# Patient Record
Sex: Male | Born: 1935 | Race: White | Hispanic: No | Marital: Married | Smoking: Former smoker
Health system: Southern US, Community
[De-identification: ages and names within clinical notes are randomized; demographics above are authoritative.]

## PROBLEM LIST (undated history)

## (undated) DIAGNOSIS — C61 Malignant neoplasm of prostate: Secondary | ICD-10-CM

## (undated) DIAGNOSIS — I480 Paroxysmal atrial fibrillation: Secondary | ICD-10-CM

## (undated) DIAGNOSIS — N183 Chronic kidney disease, stage 3 unspecified: Secondary | ICD-10-CM

## (undated) DIAGNOSIS — I10 Essential (primary) hypertension: Secondary | ICD-10-CM

## (undated) DIAGNOSIS — H539 Unspecified visual disturbance: Secondary | ICD-10-CM

## (undated) DIAGNOSIS — R0600 Dyspnea, unspecified: Secondary | ICD-10-CM

## (undated) DIAGNOSIS — I2510 Atherosclerotic heart disease of native coronary artery without angina pectoris: Secondary | ICD-10-CM

## (undated) DIAGNOSIS — K5190 Ulcerative colitis, unspecified, without complications: Secondary | ICD-10-CM

## (undated) DIAGNOSIS — R0609 Other forms of dyspnea: Secondary | ICD-10-CM

## (undated) DIAGNOSIS — E785 Hyperlipidemia, unspecified: Secondary | ICD-10-CM

## (undated) DIAGNOSIS — H4089 Other specified glaucoma: Secondary | ICD-10-CM

## (undated) DIAGNOSIS — Z5189 Encounter for other specified aftercare: Secondary | ICD-10-CM

## (undated) DIAGNOSIS — D649 Anemia, unspecified: Secondary | ICD-10-CM

## (undated) DIAGNOSIS — D7589 Other specified diseases of blood and blood-forming organs: Secondary | ICD-10-CM

## (undated) DIAGNOSIS — C4490 Unspecified malignant neoplasm of skin, unspecified: Secondary | ICD-10-CM

## (undated) HISTORY — DX: Encounter for other specified aftercare: Z51.89

## (undated) HISTORY — PX: EYE SURGERY: SHX253

## (undated) HISTORY — DX: Atherosclerotic heart disease of native coronary artery without angina pectoris: I25.10

## (undated) HISTORY — DX: Paroxysmal atrial fibrillation: I48.0

## (undated) HISTORY — DX: Other specified diseases of blood and blood-forming organs: D75.89

## (undated) HISTORY — DX: Anemia, unspecified: D64.9

## (undated) HISTORY — DX: Malignant neoplasm of prostate: C61

## (undated) HISTORY — PX: CARDIAC SURGERY: SHX584

## (undated) HISTORY — DX: Unspecified malignant neoplasm of skin, unspecified: C44.90

## (undated) HISTORY — DX: Ulcerative colitis, unspecified, without complications: K51.90

## (undated) HISTORY — DX: Dyspnea, unspecified: R06.00

## (undated) HISTORY — DX: Chronic kidney disease, stage 3 unspecified: N18.30

## (undated) HISTORY — DX: Unspecified visual disturbance: H53.9

## (undated) HISTORY — DX: Essential (primary) hypertension: I10

## (undated) HISTORY — DX: Hyperlipidemia, unspecified: E78.5

## (undated) HISTORY — DX: Other specified glaucoma: H40.89

## (undated) HISTORY — DX: Other forms of dyspnea: R06.09

---

## 1988-03-22 DIAGNOSIS — M329 Systemic lupus erythematosus, unspecified: Secondary | ICD-10-CM

## 1988-03-22 HISTORY — DX: Systemic lupus erythematosus, unspecified: M32.9

## 1995-06-06 ENCOUNTER — Ambulatory Visit: Admission: RE | Admit: 1995-06-06 | Payer: Self-pay | Source: Ambulatory Visit | Admitting: Gastroenterology

## 1997-12-08 ENCOUNTER — Ambulatory Visit: Admit: 1997-12-08 | Disposition: A | Discharge status: Home / Self Care | Payer: Self-pay | Source: Ambulatory Visit

## 1997-12-12 ENCOUNTER — Ambulatory Visit: Admission: RE | Admit: 1997-12-12 | Payer: Self-pay | Source: Ambulatory Visit | Admitting: Gastroenterology

## 2000-12-01 ENCOUNTER — Ambulatory Visit: Admission: RE | Admit: 2000-12-01 | Payer: Self-pay | Source: Ambulatory Visit

## 2001-01-13 ENCOUNTER — Ambulatory Visit: Admission: EM | Admit: 2001-01-13 | Payer: Self-pay | Source: Ambulatory Visit

## 2002-01-22 ENCOUNTER — Ambulatory Visit: Admission: EM | Admit: 2002-01-22 | Payer: Self-pay | Source: Ambulatory Visit | Admitting: Gastroenterology

## 2006-09-13 ENCOUNTER — Ambulatory Visit: Admission: RE | Admit: 2006-09-13 | Payer: Self-pay | Source: Ambulatory Visit | Admitting: Gastroenterology

## 2008-02-21 ENCOUNTER — Ambulatory Visit
Admit: 2008-02-21 | Disposition: A | Discharge status: Home / Self Care | Payer: Self-pay | Source: Ambulatory Visit | Admitting: Gastroenterology

## 2008-02-21 LAB — CBC
Hematocrit: 35.8 % — ABNORMAL LOW (ref 42.0–52.0)
Hgb: 10.7 G/DL — ABNORMAL LOW (ref 13.0–17.0)
MCH: 24.2 PG — ABNORMAL LOW (ref 28.0–32.0)
MCHC: 29.9 G/DL — ABNORMAL LOW (ref 32.0–36.0)
MCV: 80.8 FL (ref 80.0–100.0)
MPV: 9.6 FL (ref 9.4–12.3)
Platelets: 336 /mm3 (ref 140–400)
RBC: 4.43 /mm3 — ABNORMAL LOW (ref 4.70–6.00)
RDW: 22.6 % — ABNORMAL HIGH (ref 11.5–15.0)
WBC: 6.98 /mm3 (ref 3.50–10.80)

## 2008-02-21 LAB — COMPREHENSIVE METABOLIC PANEL
ALT: 18 U/L (ref 3–36)
AST (SGOT): 22 U/L (ref 10–41)
Albumin/Globulin Ratio: 1.2 (ref 1.1–1.8)
Albumin: 3.8 g/dL (ref 3.4–4.9)
Alkaline Phosphatase: 73 U/L (ref 43–112)
BUN: 18 mg/dL (ref 8–20)
Bilirubin, Total: <0.1 mg/dL (ref 0.1–1.0)
CO2: 28 mEq/L (ref 21–30)
Calcium: 9.1 mg/dL (ref 8.6–10.2)
Chloride: 106 mEq/L (ref 98–107)
Creatinine: 1.3 mg/dL (ref 0.6–1.5)
Globulin: 3.3 g/dL (ref 2.0–3.7)
Glucose: 77 mg/dL (ref 70–100)
Potassium: 4.9 mEq/L (ref 3.6–5.0)
Protein, Total: 7.1 g/dL (ref 6.0–8.0)
Sodium: 138 mEq/L (ref 136–146)

## 2008-02-21 LAB — GFR

## 2008-03-06 ENCOUNTER — Ambulatory Visit: Admission: RE | Admit: 2008-03-06 | Payer: Self-pay | Source: Ambulatory Visit | Admitting: Gastroenterology

## 2010-12-24 LAB — ECG 12-LEAD
Atrial Rate: 51 {beats}/min
P Axis: -15 degrees
P-R Interval: 160 ms
Q-T Interval: 458 ms
QRS Duration: 88 ms
QTC Calculation (Bezet): 422 ms
R Axis: -43 degrees
T Axis: 75 degrees
Ventricular Rate: 51 {beats}/min

## 2011-03-09 NOTE — Op Note (Signed)
MRN: [id number] DOCUMENT ID: [id number]      INTRODUCTION:      MALE PATIENT PRESENTS FOR AN ELECTIVE OUTPATIENT COLONOSCOPY.  THE      INDICATION FOR THE PROCEDURE WAS ANEMIA.      CONSENT:      THE BENEFITS, RISKS, AND ALTERNATIVES TO THE PROCEDURE WERE DISCUSSED AND      INFORMED CONSENT WAS OBTAINED.      PREPARATION:      EKG, PULSE, PULSE OXIMETRY AND BLOOD PRESSURE MONITORED.      MEDICATIONS:      - MIDAZOLAM HCL 3 MG IV THROUGHOUT THE PROCEDURE      - FENTANYL 75 MCG IV THROUGHOUT THE PROCEDURE      PROCEDURE:      RECTAL EXAM: NORMAL.      THE ENDOSCOPE WAS PASSED WITH EASE TO THE CECUM CONFIRMED BY LANDMARKS,      PHOTOGRAPH AND TRANSILLUMINATION.      ASA CLASSIFICATION:      CLASS 2:  PATIENT HAS MILD TO MODERATE SYSTEMIC DISTURBANCE THAT MAY OR      MAY NOT BE RELATED TO THE DISORDER REQUIRING SURGERY.      FINDINGS:  SINGLE LARGE SESSILE POLYP WAS SEEN IN THE PROXIMAL CECUM WHICH      MEASURED 1.5 CM.  THE POLYP WAS REMOVED BY SNARE CAUTERY USING UNBLENDED      20 WATTS.  A FEW LOCALIZED ANGIOECTASIAS WERE PRESENT IN THE CECUM WHICH      WERE NOT BLEEDING.  THESE WERE TREATED WITH GOLD PROBE CAUTERY AT      20JOULES. SINGLE DIMINUTIVE POLYP WAS SEEN IN THE MID ASCENDING COLON      WHICH MEASURED 3 MM.  AN EXCISIONAL BIOPSY WAS OBTAINED.  TWO SMALL      SESSILE POLYPS WERE SEEN IN THE DISTAL SIGMOID MEASURING 5 MM.  THE POLYP      WAS REMOVED BY SNARE CAUTERY.  SINGLE SMALL SESSILE POLYP WAS SEEN IN THE      DISTAL SIGMOID WHICH MEASURED 3 MM.  AN EXCISIONAL BIOPSY WAS OBTAINED.      THERE WERE MULTIPLE SMALL DIVERTICULA PRESENT IN THE SIGMOID.  THE      COLONOSCOPY WAS OTHERWISE NORMAL.      COMPLICATIONS:      THERE WERE NO COMPLICATIONS ASSOCIATED WITH THE PROCEDURE.      IMPRESSION:      1.  SINGLE LARGE SESSILE POLYP IN THE PROXIMAL CECUM. [211.3]. THE POLYP      WAS REMOVED BY SNARE CAUTERY.      2.  A FEW LOCALIZED ANGIOECTASIAS IN THE CECUM. [447.9]. THESE WERE      TREATED WITH GOLD PROBE CAUTERY AT  20JOULES.      3.  SINGLE DIMINUTIVE POLYP IN THE MID ASCENDING COLON. [211.3]. AN      EXCISIONAL BIOPSY WAS OBTAINED.      4.  TWO SMALL SESSILE POLYPS IN THE DISTAL SIGMOID. [211.3]. THE POLYP WAS      REMOVED BY SNARE CAUTERY.      5.  SINGLE SMALL SESSILE POLYP IN THE DISTAL SIGMOID. [211.3]. AN      EXCISIONAL BIOPSY WAS OBTAINED.      6.  MULTIPLE SMALL DIVERTICULA IN THE SIGMOID. [562.10].      7.  COLONOSCOPY, OTHERWISE NORMAL.      PROCEDURE CODES:      45385: COLONOSCOPY TO CECUM WITH SNARE POLYPECTOMY      45380: COLONOSCOPY TO CECUM AND BIOPSY  16109: COLONOSCOPY TO CECUM AND CONTROL OF HEMORRHAGE.      RECOMMENDATION:      - FOLLOW-UP ON THE RESULTS OF BIOPSY SPECIMENS IN 2 WEEKS.      - REPEAT COLONOSCOPY IN 1 YEAR.      SIGNING PHYSICIAN: Konrad Saha

## 2011-03-09 NOTE — Op Note (Signed)
MRN: [id number] DOCUMENT ID: [id number]      INTRODUCTION:      MALE PATIENT PRESENTS FOR AN ELECTIVE OUTPATIENT EGD.  THE INDICATION FOR      THE PROCEDURE WAS ANEMIA.      CONSENT:      THE BENEFITS, RISKS, AND ALTERNATIVES TO THE PROCEDURE WERE DISCUSSED AND      INFORMED CONSENT WAS OBTAINED.      PREPARATION:      EKG, PULSE, PULSE OXIMETRY AND BLOOD PRESSURE MONITORED.      MEDICATIONS:      - MIDAZOLAM HCL 5 MG IV THROUGHOUT THE PROCEDURE      - FENTANYL 100 MCG IV BEFORE THE PROCEDURE      PROCEDURE:      THE ENDOSCOPE WAS PASSED WITH EASE BY MANUAL INSERTION TO THE 2ND PORTION      OF THE DUODENUM.      ASA CLASSIFICATION:      CLASS 2:  PATIENT HAS MILD TO MODERATE SYSTEMIC DISTURBANCE THAT MAY OR      MAY NOT BE RELATED TO THE DISORDER REQUIRING SURGERY.      FINDINGS:      HYPOPHARYNX: THE HYPOPHARYNX APPEARED NORMAL.      ESOPHAGUS: A SINGLE ACUTE ULCER WAS SEEN IN THE DISTAL ESOPHAGUS WHICH      MEASURED 1 CM.  A FEW BIOPSIES WERE OBTAINED.      GE-JUNCTION: BARRETT'S ESOPHAGUS WAS NOTED.  THERE WAS A MODERATE HIATAL      HERNIA.      STOMACH: MANY LOCALIZED ANGIOECTASIAS WERE PRESENT IN THE ANTRUM WHICH      WERE BLEEDING.  THEY SHOWED NO STIGMATA OF BLEEDING.  GOLD PROBE WAS USED      TO CAUSTERIZE THE AVMS.      PYLORUS: THE PYLORUS APPEARED NORMAL.      DUODENUM: THE DUODENUM APPEARED NORMAL.      COMPLICATIONS:      THERE WERE NO COMPLICATIONS ASSOCIATED WITH THE PROCEDURE.      IMPRESSION:      1.  THE HYPOPHARYNX APPEARED NORMAL.      2.  SINGLE ACUTE ULCER IN THE DISTAL ESOPHAGUS. [530.2]. A FEW BIOPSIES      WERE OBTAINED.      3.  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      4.  HIATAL HERNIA. [553.3].      5.  ANGIOECTASIAS IN THE ANTRUM. [447.9]. GOLD PROBE WAS USED TO      CAUSTERIZE THE AVMS.      6.  THE PYLORUS APPEARED NORMAL.      7.  THE DUODENUM APPEARED NORMAL.      PROCEDURE CODES:      43255: ESOPHAGOGASTRODUODENOSCOPY AND CONTROL OF HEMORRHAGE BY ANY METHOD      43239: ESOPHAGOGASTRODUODENOSCOPY  AND BIOPSY.      RECOMMENDATION:      - FOLLOW-UP ON THE RESULTS OF BIOPSY SPECIMENS IN 2 WEEKS.      - FOLLOW-UP WITH Marlana Latus MD IN 4 WEEKS.      SIGNING PHYSICIAN: Konrad Saha

## 2011-03-09 NOTE — Op Note (Signed)
MRN: [id number] DOCUMENT ID: [id number]      INTRODUCTION:      77 YEAR OLD MALE PATIENT PRESENTS FOR AN ELECTIVE OUTPATIENT COLONOSCOPY.      THE INDICATIONS FOR THE PROCEDURE WERE IRON DEFICIENCY ANEMIA AND      SURVEILLANCE FOR COLONIC POLYPS.      CONSENT:      THE BENEFITS, RISKS, AND ALTERNATIVES TO THE PROCEDURE WERE DISCUSSED AND      INFORMED CONSENT WAS OBTAINED.      PREPARATION:      EKG, PULSE, PULSE OXIMETRY AND BLOOD PRESSURE MONITORED.      MEDICATIONS:      - PROPOFOL      PROCEDURE:      RECTAL EXAM: NORMAL.      THE ENDOSCOPE WAS PASSED WITH EASE TO THE CECUM CONFIRMED BY LANDMARKS,      PHOTOGRAPH AND TRANSILLUMINATION.      ASA CLASSIFICATION:      CLASS 2:  PATIENT HAS MILD TO MODERATE SYSTEMIC DISTURBANCE THAT MAY OR      MAY NOT BE RELATED TO THE DISORDER REQUIRING SURGERY.      FINDINGS:  A SINGLE MEDIUM-SIZED LOCALIZED ANGIOECTASIA WAS PRESENT IN THE      CECUM WHICH WAS NOT BLEEDING.  IT SHOWED NO STIGMATA OF BLEEDING.      BLEEDING CONTROL WAS PERFORMED, ARGON PLASMA COAGULATION WAS APPLIED INTO      THE ARTERIOVENOUS MALFORMATION USING POWER SETTING OF 25 WATTS, GAS FLOW      OF 2 L/MIN.  THE POST PROCEDURAL ENDOSCOPIC APPEARANCES WERE SATISFACTORY.       SINGLE SMALL SESSILE POLYP WAS SEEN IN THE DISTAL TRANSVERSE COLON WHICH      MEASURED 5 MM.  THE POLYP WAS REMOVED BY COLD FORCEP POLYPECTOMY.  THERE      WERE NUMEROUS MEDIUM DIVERTICULA PRESENT IN THE SIGMOID.  MODERATE      INTERNAL HEMORRHOIDS WERE PRESENT.  THE COLONOSCOPY WAS OTHERWISE NORMAL.      COMPLICATIONS:      THERE WERE NO COMPLICATIONS ASSOCIATED WITH THE PROCEDURE.      IMPRESSION:      1.  A SINGLE MEDIUM-SIZED LOCALIZED ANGIOECTASIA IN THE CECUM. [447.9].      BLEEDING CONTROL WAS PERFORMED, THE POST PROCEDURAL ENDOSCOPIC APPEARANCES      WERE SATISFACTORY.      2.  SINGLE SMALL SESSILE POLYP IN THE DISTAL TRANSVERSE COLON. [211.3].      3.  NUMEROUS MEDIUM DIVERTICULA IN THE SIGMOID. [562.10].      4.  MODERATE INTERNAL  HEMORRHOIDS WERE PRESENT [455.0].      PROCEDURE CODES:      45380: COLONOSCOPY TO CECUM AND BIOPSY      45382: COLONOSCOPY TO CECUM AND CONTROL OF HEMORRHAGE.      RECOMMENDATION:      - FOLLOW-UP ON THE RESULTS OF BIOPSY SPECIMENS IN 2 WEEKS.      - REPEAT COLONOSCOPY IN 5 YEARS.      SIGNING PHYSICIAN: Konrad Saha

## 2011-03-09 NOTE — Op Note (Signed)
MRN: [id number] DOCUMENT ID: [id number]      INTRODUCTION:      77 YEAR OLD MALE PATIENT PRESENTS FOR AN ELECTIVE OUTPATIENT EGD.  THE      INDICATION FOR THE PROCEDURE WAS IRON DEFICIENCY ANEMIA.      CONSENT:      THE BENEFITS, RISKS, AND ALTERNATIVES TO THE PROCEDURE WERE DISCUSSED AND      INFORMED CONSENT WAS OBTAINED.      PREPARATION:      EKG, PULSE, PULSE OXIMETRY AND BLOOD PRESSURE MONITORED.      MEDICATIONS:      - PROPOFOL      PROCEDURE:      THE ENDOSCOPE WAS PASSED WITH EASE UNDER DIRECT VISUALIZATION TO THE 2ND      PORTION OF THE DUODENUM.      ASA CLASSIFICATION:      CLASS 2:  PATIENT HAS MILD TO MODERATE SYSTEMIC DISTURBANCE THAT MAY OR      MAY NOT BE RELATED TO THE DISORDER REQUIRING SURGERY.      FINDINGS:      HYPOPHARYNX: THE HYPOPHARYNX APPEARED NORMAL.      ESOPHAGUS: A SINGLE SUPERFICIAL ULCER WAS SEEN AT THE GASTROESOPHAGEAL      JUNCTION WHICH MEASURED 2 CM.  TWO BIOPSIES WERE OBTAINED.      GE-JUNCTION: THERE WAS A SMALL HIATAL HERNIA.      STOMACH: MANY DIFFUSE ANGIOECTASIAS WERE PRESENT IN THE ANTRUM AND BODY OF      THE STOMACH WHICH WERE BLEEDING.  THEY SHOWED STIGMATA OF BLEEDING.      BLEEDING CONTROL WAS PERFORMED, ARGON PLASMA COAGULATION WAS APPLIED INTO      AROUND THE BASE USING POWER SETTING OF 60 WATTS, GAS FLOW OF 1 L/MIN.  THE      POST PROCEDURAL ENDOSCOPIC APPEARANCES WERE SATISFACTORY.      PYLORUS: THE PYLORUS APPEARED NORMAL.      DUODENUM: THE DUODENUM APPEARED NORMAL.      COMPLICATIONS:      THERE WERE NO COMPLICATIONS ASSOCIATED WITH THE PROCEDURE.      IMPRESSION:      1.  THE HYPOPHARYNX APPEARED NORMAL.      2.  SINGLE SUPERFICIAL ULCER AT THE GASTROESOPHAGEAL JUNCTION. [530.2].      TWO BIOPSIES WERE OBTAINED.      3.  HIATAL HERNIA. [553.3].      4.  ANGIOECTASIAS IN THE ANTRUM AND BODY OF THE STOMACH. [447.9]. BLEEDING      CONTROL WAS PERFORMED, THE POST PROCEDURAL ENDOSCOPIC APPEARANCES WERE      SATISFACTORY.      5.  THE PYLORUS APPEARED NORMAL.      6.  THE  DUODENUM APPEARED NORMAL.      PROCEDURE CODES:      43239: ESOPHAGOGASTRODUODENOSCOPY AND BIOPSY      43255: ESOPHAGOGASTRODUODENOSCOPY AND CONTROL OF HEMORRHAGE BY ANY METHOD.      RECOMMENDATION:      - WOULD CONTINUE IRON THERAPY INDEFINITELY AND REPEAT EGDS AS NEEDED BASED      UPON BLOOD CT..      - FOLLOW-UP ON THE RESULTS OF BIOPSY SPECIMENS IN 2 WEEKS.      - FOLLOW-UP WITH Marlana Latus MD IN 4 WEEKS.      SIGNING PHYSICIAN: Konrad Saha

## 2011-07-19 ENCOUNTER — Ambulatory Visit: Payer: Medicare Other

## 2011-07-21 ENCOUNTER — Ambulatory Visit: Payer: Medicare Other | Admitting: Anesthesiology

## 2011-07-21 ENCOUNTER — Encounter: Payer: Self-pay | Admitting: Anesthesiology

## 2011-07-21 ENCOUNTER — Encounter
Admission: RE | Disposition: A | Discharge status: Home / Self Care | Payer: Self-pay | Source: Ambulatory Visit | Attending: Gastroenterology

## 2011-07-21 ENCOUNTER — Ambulatory Visit
Admission: RE | Admit: 2011-07-21 | Discharge: 2011-07-21 | Disposition: A | Discharge status: Home / Self Care | Payer: Medicare Other | Source: Ambulatory Visit | Attending: Gastroenterology | Admitting: Gastroenterology

## 2011-07-21 ENCOUNTER — Ambulatory Visit: Payer: Medicare Other | Admitting: Gastroenterology

## 2011-07-21 DIAGNOSIS — K449 Diaphragmatic hernia without obstruction or gangrene: Secondary | ICD-10-CM | POA: Insufficient documentation

## 2011-07-21 DIAGNOSIS — K31819 Angiodysplasia of stomach and duodenum without bleeding: Secondary | ICD-10-CM | POA: Insufficient documentation

## 2011-07-21 DIAGNOSIS — K5730 Diverticulosis of large intestine without perforation or abscess without bleeding: Secondary | ICD-10-CM | POA: Insufficient documentation

## 2011-07-21 DIAGNOSIS — K5520 Angiodysplasia of colon without hemorrhage: Secondary | ICD-10-CM | POA: Insufficient documentation

## 2011-07-21 DIAGNOSIS — K648 Other hemorrhoids: Secondary | ICD-10-CM | POA: Insufficient documentation

## 2011-07-21 DIAGNOSIS — D649 Anemia, unspecified: Secondary | ICD-10-CM | POA: Insufficient documentation

## 2011-07-21 SURGERY — ESOPHAGOGASTRODUODENOSCOPY (EGD), BIOPSY
Anesthesia: Anesthesia General | Site: Esophagus | Wound class: Clean

## 2011-07-21 MED ORDER — METOCLOPRAMIDE HCL 5 MG/ML IJ SOLN
10.0000 mg | Freq: Once | INTRAMUSCULAR | Status: DC | PRN
Start: 2011-07-21 — End: 2011-07-21

## 2011-07-21 MED ORDER — GLYCOPYRROLATE 0.2 MG/ML IJ SOLN
INTRAMUSCULAR | Status: DC | PRN
Start: 2011-07-21 — End: 2011-07-21
  Administered 2011-07-21: 0.2 mg via INTRAVENOUS

## 2011-07-21 MED ORDER — PROPOFOL 10 MG/ML IV EMUL
INTRAVENOUS | Status: DC | PRN
Start: 2011-07-21 — End: 2011-07-21
  Administered 2011-07-21: 100 ug/kg/min via INTRAVENOUS

## 2011-07-21 MED ORDER — DIPHENHYDRAMINE HCL 50 MG/ML IJ SOLN
6.2500 mg | Freq: Four times a day (QID) | INTRAMUSCULAR | Status: DC | PRN
Start: 2011-07-21 — End: 2011-07-21

## 2011-07-21 MED ORDER — HYDROMORPHONE HCL PF 1 MG/ML IJ SOLN
0.5000 mg | INTRAMUSCULAR | Status: DC | PRN
Start: 2011-07-21 — End: 2011-07-21

## 2011-07-21 MED ORDER — LACTATED RINGERS IV SOLN
INTRAVENOUS | Status: DC
Start: 2011-07-21 — End: 2011-07-21

## 2011-07-21 MED ORDER — LIDOCAINE HCL 2 % IJ SOLN
INTRAMUSCULAR | Status: DC | PRN
Start: 2011-07-21 — End: 2011-07-21
  Administered 2011-07-21: 50 mg

## 2011-07-21 MED ORDER — EPHEDRINE SULFATE 50 MG/ML IJ SOLN
INTRAMUSCULAR | Status: DC | PRN
Start: 2011-07-21 — End: 2011-07-21
  Administered 2011-07-21: 10 mg via INTRAVENOUS

## 2011-07-21 MED ORDER — PHENYLEPHRINE 100 MCG/ML IV BOLUS (ANESTHESIA)
PREFILLED_SYRINGE | INTRAVENOUS | Status: DC | PRN
Start: 2011-07-21 — End: 2011-07-21
  Administered 2011-07-21: 100 ug via INTRAVENOUS

## 2011-07-21 MED ORDER — FENTANYL CITRATE 0.05 MG/ML IJ SOLN
25.0000 ug | INTRAMUSCULAR | Status: DC | PRN
Start: 2011-07-21 — End: 2011-07-21

## 2011-07-21 MED ORDER — PROPOFOL 10 MG/ML IV EMUL
INTRAVENOUS | Status: DC | PRN
Start: 2011-07-21 — End: 2011-07-21
  Administered 2011-07-21: 50 mg via INTRAVENOUS

## 2011-07-21 MED ORDER — LACTATED RINGERS IV SOLN
INTRAVENOUS | Status: DC | PRN
Start: 2011-07-21 — End: 2011-07-21

## 2011-07-21 MED ORDER — ONDANSETRON HCL 4 MG/2ML IJ SOLN
4.0000 mg | Freq: Once | INTRAMUSCULAR | Status: DC | PRN
Start: 2011-07-21 — End: 2011-07-21

## 2011-07-21 SURGICAL SUPPLY — 16 items
CONTAINER HISTOLOGY 60 ML 30 ML GRADUATE LEAK RESISTANT O RING PREFILL (Procedure Accessories) IMPLANT
FORCEPS BIOPSY L240 CM LARGE CAPACITY (Instrument) IMPLANT
FORCEPS BIOPSY L240 CM MICROMESH TEETH STREAMLINE CATHETER NEEDLE (Instrument) IMPLANT
FORCEPS BX SS LG CPC RJ 4 2.4MM 240CM (Instrument)
GOWN ISL PP PE REG LG LF FULL BCK NK TIE (Gown) ×4 IMPLANT
GOWN ISOLATION REGULAR LARGE FULL BACK NECK TIE ELASTIC CUFF (Gown) ×3 IMPLANT
SOL FORMALIN 10% PREFILL 30ML (Procedure Accessories) IMPLANT
SPONGE GAUZE L4 IN X W4 IN 4 PLY HIGH (Sponge) ×3 IMPLANT
SPONGE GAUZE L4 IN X W4 IN 4 PLY NONWOVEN LINT FREE CURITY RAYON (Sponge) ×3 IMPLANT
SPONGE GZE RYN PLSTR CRTY 4X4IN LF NS 4 (Sponge) ×1
SYRINGE 50 ML GRADUATE NONPYROGENIC DEHP (Syringes, Needles) ×3 IMPLANT
SYRINGE 50 ML GRADUATE NONPYROGENIC DEHP FREE PVC FREE BD MEDICAL (Syringes, Needles) ×3 IMPLANT
SYRINGE MED 50ML LF STRL GRAD N-PYRG (Syringes, Needles) ×1
WATER STERILE PLASTIC POUR BOTTLE 1000 (Irrigation Solutions) ×6 IMPLANT
WATER STERILE PLASTIC POUR BOTTLE 1000 ML (Irrigation Solutions) ×6 IMPLANT
WATER STRL 1000ML PLS PR BTL LF (Irrigation Solutions) ×2

## 2011-07-21 NOTE — Discharge Instructions (Signed)
HEMORRHOIDS,External    A hemorrhoid is a local swelling of the veins around the rectum. These most often occur from repeated forceful straining during bowel movements or heavy lifting. It may also occur in the last few months of pregnancy. A hemorrhoid feels like a soft lump. It may itch from time to time. When it is inflamed it becomes hard and very painful.  HOME CARE:  1. SITZ BATHS: Sit in a tub filled with about 6 inches of hot water. Allow the water to run in order to keep it hot for a total of 10-15 minutes. Repeat this three times a day until pain is relieved.  2. Keep your stools soft to avoid the need to strain when having a bowel movement. Unless another medicine was prescribed, try the following:  IF YOU ARE CONSTIPATED: You may use over-the-counter laxatives such as MILK OF MAGNESIA (mild acting) or, DULCOLAX (if stronger action is needed).  IF YOU ARE NOT CONSTIPATED but stools are hard, try taking Colace (docusate sodium) which is a stool softener. This will soften stools without producing diarrhea. Drinking extra fluids may also help.  3. The use of creams applied to the hemorrhoid itself, such as ANUSOL or PREPARATION H, will be helpful to reduce pain and itching, and speed healing.  PREVENTION:  Avoid straining on the toilet by keeping stools soft. Increasing FIBER in your diet (fruits, cereals, vegetables and grains) will promote healthy bowel movement. If this is not working, you may use METAMUCIL and similar products. These are over-the-counter fiber supplements. You must drink extra fluids when taking these to avoid constipation.  FOLLOW UP with your doctor if you do not begin to respond to the above treatment within the next few days.  GET PROMPT MEDICAL ATTENTION if any of the following occur:   Large amount of rectal bleeding (more than 1 cup of blood in 24 hours)   Increasing rectal pain or rectal pain that continues for more than three days of treatment   Weakness, dizziness or  fainting   Vomiting blood (red or black color)   2000-2011 [employer], [address], [place] [address]. All rights reserved. This information is not intended as a substitute for professional medical care. Always follow your healthcare professional's instructions.  Colonoscopy  Colonoscopyis used to view the inside of your lower digestive tract (colon and rectum). It can help screen for colon cancer and can also help find the source of abdominal pain,bleeding,and changes in bowel habits. The test is usually done in the hospital on an outpatient basis. During the exam, the doctor can remove a small tissue sample ( a biopsy) for testing. Small growths, such as polyps, may also be removed during colonoscopy.     A camera attached to a flexible tube with a viewing lens is used to take video pictures.   Getting Ready   Be sure to tell your doctor about any medications you take. Als[place]ell your doctor about any health conditions you may have.   Discuss the risks of the test with your doctor. These includebleeding and bowel puncture.   Your rectum and colon must be empty for the test. So be sure tofollow the diet and bowel prep instructions exactly. If you don't, the test may need to be rescheduled.   Ask your doctor whether you need to have a friend or family member prepared to drive you home after the test.     Colonoscopy provides an inside view of the entire colon.     During the Test   You are given sedating (relaxing) medication through an IV line.You may be drowsy or completely asleep.   The procedure takes or longer.   The doctor performs a digital rectal exam to check for anal andrectal problems. The rectum is lubricated and the scope inserted.   If you are awake, you may have a feeling similar to needing to have a bowelmovement. You may also feel pressure as air is pumped into the colon. It's okay to pass gas during the procedure.  After the Test   You may discuss the  results with your doctor right away or at a future visit.   Try to pass all the gas right after the test to help prevent bloating and cramping.   After the test, you can go back to your normal eating andother activities.  Risks and Possible Complications Include:   Bleeding  A puncture or tear in the colon  Risks of anesthesia       [address] [place], [place] [address]. All rights reserved. This information is not intended as a substitute for professional medical care. Always follow your healthcare professional's instructions.Hiatal Hernia    You have been diagnosed with a hiatal hernia.    A hiatal hernia occurs when a portion of the stomach protrudes or pokes through the diaphragm and into the chest. The diaphragm is a muscle that allows you to breathe. It is normal for your esophagus (food pipe) to travel through a hole in the diaphragm and into your stomach. If this hole is too large or the area around the hole is weak, the stomach may bulge through the hole, too. This is a hiatal hernia.    Hiatal hernias are a common condition; especially in older individuals. They are more common in women and people who are overweight.    Most hiatal hernias cause no symptoms at all. They are often discovered on a routine chest x-ray. Larger hernias cause more symptoms that may include:   Difficulty swallowing, painful swallowing, or food getting stuck in the esophagus (food pipe).   Chest pain, heartburn, or bleeding in the stomach or esophagus.    Treatment of hiatal hernia often depends on the size of the hernia, location of the hernia, and symptoms. Treatment may include:   Antacids (Maalox, Mylanta) to neutralize stomach acid.   Lifestyle changes such as weight loss.   Prescription or over-the-counter medications like H2 blockers or Proton pump inhibitors, to reduce acid. These include Zantac, Pepcid, Prilosec, Nexium, Axid, Protonix and others.   Surgical repair. Some  people require surgery if they have large hernias with severe symptoms that do not improve with medication or lifestyle changes. Your doctor will help with this decision and may refer you to a gastroenterologist (stomach doctor) or surgeon.    Here are some things that you can do to help control your symptoms:   Eat 6 small meals per day instead of 3 big meals.   Eat slowly.   Avoid alcohol, caffeine and fatty foods.   Roe Coombs t smoke! Smoking increases stomach acids.    YOU SHOULD SEEK MEDICAL ATTENTION IMMEDIATELY, EITHER HERE OR AT THE NEAREST EMERGENCY DEPARTMENT, IF ANY OF THE FOLLOWING OCCURS:   Increasing or persistent pain. Severe chest or abdominal pain.   Persistent vomiting or difficulty swallowing.   Vomiting of blood or blood in your stool. Blood can be bright red, dark red, or black and tarry if it is  digested.   Food becoming stuck in the esophagus (food tube) or inability to swallow food or water.        Upper GI Endoscopy  Upper GI endoscopy allows your doctor to look directly into the beginning of your gastrointestinal (GI) tract. The esophagus, stomach, and duodenum (the first part of the small intestine) make up the upper GI tract.     During endoscopy,a long,flexible tube is used to view the inside of your upper GI tract.    Before the Exam  Follow these and any other instructions you are given before your endoscopy. If you don't follow the doctor's instructions carefully, the test may need to be cancelled or done over.   Do not eat or drink anything after midnight the night before your exam. If your exam is in the afternoon, drink only clear liquids in the morning, and do not eat or drink anything for 6 hours before the exam.   Bring your x-rays and any other test results you have.   Because you will be sedated, arrange for an adult to drive you home after the exam.   Tell your healthcare provider before the exam if you are taking any medications or have any medical problems.  The  Procedure   You lie on the endoscopy table.   Your throat may be numbed with a spray or gargle. You are given sedating (relaxing) medication through an intravenous (IV) line.   You swallow the endoscope. This is thinner than most pieces of food that you swallow. It will not affect your breathing. The medication helps keep you from gagging.   Air is inserted to expand your GI tract. It can make you burp.   The endoscope carries images of your upper GI tract to a video screen. If you are awake, you may be able to look at the images.   After the procedure is done, you rest for a time. An adult must drive you home.  Call your doctor if you have:   Black or tarry stools; blood in your stool.   Fever.   Persistent pain in your abdomen.    [address], [address] [place], [place] [address]. All rights reserved. This information is not intended as a substitute for professional medical care. Always follow your healthcare pro  Arteriovenous Malformation (AVM)  You have been told that you have an arteriovenous malformation (AVM). An AVM is an abnormal tangle of blood vessels within the brain. Over time, pressure can build up and the AVM can rupture (burst). If you have an AVM, you were probably born with it. But most people don't know they have one until a problem occurs. Signs of an AVM include bad headaches, blurred or double vision, and seizures (jerking movements that are out of your control).    Understanding an AVM  The brain controls the body. You can move and feel because of the brain. And it is the brain that makes you able to think, show emotions, and make judgments. An AVM can damage the brain and put the rest of the body in danger.  Inside the Skull  Under the scalp and the skull, a tough membrane (called the dura) surrounds the brain. Beneath the dura, cerebrospinal fluid (CSF) cushions the brain. Blood vessels carry nutrients and oxygen-rich blood throughout the brain.    A Problem  with Blood Flow  An AVM is a tangle of blood vessels. It can cause pressure to build up in the  blood vessel and prevent normal blood flow. If the pressure becomes too great, a blood vessel can burst and blood can leak into the brain. This can damage parts of the brain that control vital body functions such as sight and movement. In some cases, problems caused by an AVM can even lead to death. But an AVM can be treated.   [address], [place], [place] [address]. All rights reserved. This information is not intended as a substitute for professional medical care. Always follow your healthcare professional's instructions.fessional's instructions.

## 2011-07-21 NOTE — Anesthesia Postprocedure Evaluation (Signed)
The patient is awake or easily arousable.  The patient's respirations and cardiovascular status have been evaluated and deemed stable post-op.  Post-op nausea, vomiting, and pain have been treated and reasonably controlled without respiratory or cardiovascular compromise.  Please refer to post-op PACU documentation for confirmation of attainment of normothermia and hydration status.  There were no obvious anesthetic related complications.  The patient has recovered adequately to be discharged from the PACU.

## 2011-07-21 NOTE — Addendum Note (Signed)
Addendum  created 07/21/11 1027 by Tessa Lerner, MD    Modules edited:Anesthesia Attestations

## 2011-07-21 NOTE — Transfer of Care (Signed)
Anesthesia Transfer of Care Note    Patient: [name]    Procedures performed: Procedure(s) with comments:  EGD, BIOPSY  COLONOSCOPY    Anesthesia type: General TIVA    Patient location:[facility]    Last vitals:   Filed Vitals:    07/21/11 0906   BP: 132/63   Pulse: 58   Resp: 16   SpO2: 99%       Post pain: Patient not complaining of pain, continue current therapy     Mental Status:lethargic    Respiratory Function: toleratinig nasal cannula    Cardiovascular: stable    Nausea/Vomiting: patient not complaining of nausea or vomiting    Hydration Status: adequate    Post assessment: no apparent anesthetic complications

## 2011-07-21 NOTE — H&P (Signed)
GI PRE PROCEDURE NOTE    Proceduralist Comments:   Review of Systems and Past Medical / Surgical History performed: Yes     Indications:anemia and anemia    Previous Adverse Reaction to Anesthesia or Sedation (if yes, describe): No    Physical Exam / Laboratory Data (If applicable)   Airway Classification: Class II    General: Alert and cooperative  Lungs: Lungs clear to auscultation  Cardiac: RRR, normal S1S2.    Abdomen: Soft, non tender. Normal active bowel sounds  Other:     No labs drawn    American Society of Anesthesiologists (ASA) Physical Status Classification:   ASA 2 - Patient with mild systemic disease with no functional limitations    Planned Sedation:   Deep sedation with anesthesia    Attestation:   [name] has been reassessed immediately prior to the procedure and is an appropriate candidate for the planned sedation and procedure. Risks, benefits and alternatives to the planned procedure and sedation have been explained to the patient or guardian:  yes        Signed by: Konrad Saha

## 2011-07-21 NOTE — Anesthesia Preprocedure Evaluation (Signed)
Anesthesia Evaluation    AIRWAY    Mallampati: I    TM distance: >3 FB  Neck ROM: full  Mouth Opening:full   CARDIOVASCULAR    cardiovascular exam normal     DENTAL           PULMONARY    pulmonary exam normal     OTHER FINDINGS              Anesthesia Plan    ASA 2   general   Detailed anesthesia plan: general IV      Post op pain management: per surgeon  intravenous induction   informed consent obtained    Plan discussed with CRNA and attending.

## 2012-03-13 NOTE — Op Note (Unsigned)
DATE OF SURGERY:                     01/22/2002            SURGEON:                            Konrad Saha, MD            ASSISTANT(S):                  DICTATED BY:                        Konrad Saha, MD            PREOPERATIVE DIAGNOSIS:  ANEMIA.            POSTOPERATIVE DIAGNOSIS:  CECAL ARTERIOVENOUS MALFORMATIONS.            PROCEDURE:  COLONOSCOPY.            MEDICATIONS:  As per previous upper endoscopy with the addition of 50 mcg      of Fentanyl and 3 mg of Versed.            DESCRIPTION OF PROCEDURE:  After performing the previously described upper      endoscopy and the additional medication given, the Olympus single-channel      video colonoscope was introduced through the rectum and onto the area of      the cecum.  Upon entry into the cecum, there were about 4 or 5 AV      malformations that were noted to be oozing blood.  The heater probe was      introduced and each of these AV malformations were cauterized and complete      hemostasis was obtained.  In the ascending colon, additional AV      malformation was seen and was cauterized.  The scope was slowly withdrawn      through the hepatic flexure and transverse colon which were normal.  The      splenic flexure and descending colon were similarly normal.  At the      junction between the sigmoid and descending colon, another small AV      malformation was seen and was cauterized.  The remainder of the sigmoid      colon, rectosigmoid and rectum were normal.  The patient tolerated the      procedure well.                                                      __________________________________________                              __________                              Konrad Saha, MD                          Date            [id number]      D: 01/22/2002  2:33 P  T:  01/23/2002 12:11 P      J:  [id number]      N: [id number]      CC:  Konrad Saha, MD          Edger House, MD

## 2012-03-13 NOTE — Op Note (Unsigned)
DATE OF SURGERY:                     01/22/2002            SURGEON:                            Konrad Saha, MD            ASSISTANT(S):                  DICTATED BY:                        Konrad Saha, MD            PREOPERATIVE DIAGNOSIS:  ANEMIA.            POSTOPERATIVE DIAGNOSIS:  MULTIPLE GASTRIC AND DUODENAL ARTERIOVENOUS      MALFORMATIONS.            PROCEDURE:  UPPER ENDOSCOPY.            MEDICATIONS:  Fentanyl 100 mcg and Versed 5 mg, Hurricaine spray to the      oropharynx.            DESCRIPTION OF PROCEDURE:  After suitable sedation, the Olympus      single-channel video upper endoscope was passed through the mouth into the      esophagus.  The esophageal mucosa appeared normal.  There was no evidence      of esophagitis.  Upon entry into the stomach, there was fresh blood seen in      the stomach.  The scope was rapidly advanced through the pylorus into the      duodenal bulb which was normal and the second portion of the duodenum which      revealed several small AV malformations.            Subsequently, the heater probe was introduced through the scope and the      visualized AV malformations of the second portion of the duodenum were      cauterized.  The scope was then reintroduced into the stomach and multiple      AV malformations were noted both in the proximal and distal stomach.  Using      the same heater probe cautery, the lesions were cauterized in a systematic      fashion requiring numerous bursts of energy to cauterize them.  Each of the      AV malformations were cauterized to completion.  There was some clotted      blood along the greater curvature of the stomach which was adherent and      could not be removed and indeed, it was possible that there may have been      other AV malformations underneath there.  Finally, no active bleeding was      noted in the stomach and the endoscope was then withdrawn.  The patient      tolerated the procedure well.                                                       __________________________________________  __________                              Konrad Saha, MD                          Date            [id number]      D: 01/22/2002  2:33 P      T:  01/23/2002 12:07 P      J:  [id number]      N: [id number]      CC:  Konrad Saha, MD          Edger House, MD

## 2012-04-02 ENCOUNTER — Observation Stay
Admission: EM | Admit: 2012-04-02 | Discharge: 2012-04-03 | Disposition: A | Discharge status: Home / Self Care | Payer: Medicare Other | Attending: Specialist | Admitting: Specialist

## 2012-04-02 ENCOUNTER — Observation Stay: Payer: Medicare Other | Admitting: Specialist

## 2012-04-02 DIAGNOSIS — K31819 Angiodysplasia of stomach and duodenum without bleeding: Secondary | ICD-10-CM | POA: Insufficient documentation

## 2012-04-02 DIAGNOSIS — Z85828 Personal history of other malignant neoplasm of skin: Secondary | ICD-10-CM | POA: Insufficient documentation

## 2012-04-02 DIAGNOSIS — K5190 Ulcerative colitis, unspecified, without complications: Secondary | ICD-10-CM | POA: Insufficient documentation

## 2012-04-02 DIAGNOSIS — D649 Anemia, unspecified: Principal | ICD-10-CM | POA: Insufficient documentation

## 2012-04-02 DIAGNOSIS — Z87891 Personal history of nicotine dependence: Secondary | ICD-10-CM | POA: Insufficient documentation

## 2012-04-02 DIAGNOSIS — K649 Unspecified hemorrhoids: Secondary | ICD-10-CM | POA: Insufficient documentation

## 2012-04-02 DIAGNOSIS — K5520 Angiodysplasia of colon without hemorrhage: Secondary | ICD-10-CM | POA: Insufficient documentation

## 2012-04-02 DIAGNOSIS — I10 Essential (primary) hypertension: Secondary | ICD-10-CM | POA: Insufficient documentation

## 2012-04-02 LAB — BASIC METABOLIC PANEL
BUN: 24 mg/dL — ABNORMAL HIGH (ref 9.0–21.0)
CO2: 18 mEq/L — ABNORMAL LOW (ref 22–29)
Calcium: 8.5 mg/dL (ref 7.9–10.6)
Chloride: 109 mEq/L — ABNORMAL HIGH (ref 98–107)
Creatinine: 1.3 mg/dL (ref 0.7–1.3)
Glucose: 133 mg/dL — ABNORMAL HIGH (ref 70–100)
Potassium: 3.8 mEq/L (ref 3.5–5.1)
Sodium: 137 mEq/L (ref 136–145)

## 2012-04-02 LAB — CBC AND DIFFERENTIAL
Basophils Absolute Automated: 0.03 10*3/uL (ref 0.00–0.20)
Basophils Automated: 0 %
Eosinophils Absolute Automated: 0.15 10*3/uL (ref 0.00–0.70)
Eosinophils Automated: 2 %
Hematocrit: 23.7 % — ABNORMAL LOW (ref 42.0–52.0)
Hgb: 6.6 g/dL — ABNORMAL LOW (ref 13.0–17.0)
Immature Granulocytes Absolute: 0.01 10*3/uL
Immature Granulocytes: 0 %
Lymphocytes Absolute Automated: 0.77 10*3/uL (ref 0.50–4.40)
Lymphocytes Automated: 12 %
MCH: 19.6 pg — ABNORMAL LOW (ref 28.0–32.0)
MCHC: 27.8 g/dL — ABNORMAL LOW (ref 32.0–36.0)
MCV: 70.5 fL — ABNORMAL LOW (ref 80.0–100.0)
MPV: 9.4 fL (ref 9.4–12.3)
Monocytes Absolute Automated: 0.69 10*3/uL (ref 0.00–1.20)
Monocytes: 10 %
Neutrophils Absolute: 4.97 10*3/uL (ref 1.80–8.10)
Neutrophils: 75 %
Nucleated RBC: 1 /100 WBC (ref 0–1)
Platelets: 288 10*3/uL (ref 140–400)
RBC: 3.36 10*6/uL — ABNORMAL LOW (ref 4.70–6.00)
RDW: 18 % — ABNORMAL HIGH (ref 12–15)
WBC: 6.62 10*3/uL (ref 3.50–10.80)

## 2012-04-02 LAB — I-STAT TROPONIN: i-STAT Troponin: 0.03 ng/mL (ref 0.00–0.09)

## 2012-04-02 LAB — CK: Creatine Kinase (CK): 84 U/L (ref 30–200)

## 2012-04-02 LAB — HEMOGLOBIN AND HEMATOCRIT, BLOOD
Hematocrit: 29 % — ABNORMAL LOW (ref 42.0–52.0)
Hgb: 8.3 g/dL — ABNORMAL LOW (ref 13.0–17.0)

## 2012-04-02 LAB — TYPE AND SCREEN
AB Screen Gel: NEGATIVE
ABO Rh: O POS

## 2012-04-02 LAB — PREPARE RBC
RBC Leukoreduced: TRANSFUSED
RBC Leukoreduced: TRANSFUSED
RBC Leukoreduced: TRANSFUSED

## 2012-04-02 LAB — GFR: EGFR: 52.7

## 2012-04-02 MED ORDER — PANTOPRAZOLE SODIUM 40 MG IV SOLR
8.00 mg/h | INTRAVENOUS | Status: DC
Start: 2012-04-02 — End: 2012-04-03
  Administered 2012-04-02 – 2012-04-03 (×3): 8 mg/h via INTRAVENOUS
  Filled 2012-04-02 (×7): qty 80

## 2012-04-02 MED ORDER — DORZOLAMIDE HCL-TIMOLOL MAL 22.3-6.8 MG/ML OP SOLN
1.00 [drp] | Freq: Two times a day (BID) | OPHTHALMIC | Status: DC
Start: 2012-04-02 — End: 2012-04-03
  Administered 2012-04-03: 1 [drp] via OPHTHALMIC
  Filled 2012-04-02: qty 10

## 2012-04-02 MED ORDER — PANTOPRAZOLE SODIUM 40 MG IV SOLR
80.00 mg | Freq: Once | INTRAVENOUS | Status: AC
Start: 2012-04-02 — End: 2012-04-02
  Administered 2012-04-02: 80 mg via INTRAVENOUS
  Filled 2012-04-02: qty 80

## 2012-04-02 MED ORDER — DEXTROSE-NACL 5-0.9 % IV SOLN
INTRAVENOUS | Status: DC
Start: 2012-04-02 — End: 2012-04-03

## 2012-04-02 MED ORDER — PEG 3350-KCL-NABCB-NACL-NASULF 236 G PO SOLR
4000.00 mL | Freq: Once | ORAL | Status: AC
Start: 2012-04-02 — End: 2012-04-02
  Administered 2012-04-02: 4000 mL via ORAL
  Filled 2012-04-02: qty 4000

## 2012-04-02 MED ORDER — LATANOPROST 0.005 % OP SOLN
1.00 [drp] | Freq: Every evening | OPHTHALMIC | Status: DC
Start: 2012-04-02 — End: 2012-04-03
  Filled 2012-04-02: qty 1

## 2012-04-02 MED ORDER — SODIUM CHLORIDE 0.9 % IV SOLN
INTRAVENOUS | Status: DC
Start: 2012-04-02 — End: 2012-04-03

## 2012-04-02 NOTE — Progress Notes (Signed)
Oriented patient to unit. Patient oob to couch, stated he would rather sit on couch than in bed. Pt notified he is NPO. Denies pain. Request sent for 1st unit blood, awaiting for it to come up from blood bank. No troubles voiding. Will continue to monitor, assess pain level, hourly rounding, call bell within reach.

## 2012-04-02 NOTE — ED Notes (Signed)
Troponin 0.03

## 2012-04-02 NOTE — H&P (Signed)
Admitted with symptomatic anemia and possible gi bleed  Iv protonix and gi consult  prbc transfusion  Full h & p dictated.

## 2012-04-02 NOTE — H&P (Signed)
Patient Type: V     ATTENDING PHYSICIAN: Frazier Richards, MD     HISTORY OF PRESENT ILLNESS:  The patient is an 79 year old gentleman who comes into the ER with a chief  complaint of shortness of breath and symptomatic anemia with hematocrit of  24.     PAST MEDICAL HISTORY:  Significant for hypertension, ulcerative colitis, which is chronic, history  of skin cancer, history of anemia with history of prior transfusions, and  glaucoma.     PAST SURGICAL HISTORY:  Significant for eye surgery.     SOCIAL HISTORY:  A former smoker, quit in 1963.  Negative for IV drug abuse.  He does drink  liquor about 1 shot per week.     OUTPATIENT MEDICATION:    Include Cosopt ophthalmic solutions, Dilantin and lisinopril.     REVIEW OF SYSTEMS:  Positive for fatigue, shortness of breath, lightheadedness.  Denies any  chest pain or any fever, nausea or vomiting.     ALLERGIES:  He has no known drug allergies.     PHYSICAL EXAMINATION:  VITAL SIGNS:  He is afebrile with a temperature of 96.6, heart rate of 113,  respiration of 18, blood pressure 116/66, saturating 98% on room air.  GENERAL:  At the time of my exam, he is alert and oriented x3, no apparent  respiratory distress.  HEENT:  Normocephalic, atraumatic.  Extraocular movements are intact.   Pupils equal, round, reactive to light and accommodation.  NECK:  No JVD, no lymphadenopathy, no thyromegaly.  HEART:  S1, S2 are audible.  CHEST:  Clear to auscultation bilaterally, tachycardic.     ABDOMEN:  Soft, nontender, normoactive bowel sounds, no hepatosplenomegaly.  EXTREMITIES:  No cyanosis, clubbing or edema.  SKIN:  Warm and dry.  MUSCULOSKELETAL:  No obvious deformity.  NEUROLOGIC:  Nonfocal.     LABORATORY DATA:  White count of 6.62, hematocrit 23.7, platelets of 288.  Sodium is 137,  potassium is 3.8, chloride 109, bicarbonate 18, BUN 24, creatinine 1.3,  blood sugar 133.  His PT INR was not done.      ASSESSMENT AND PLAN:  The patient admitted with symptomatic anemia who has  been losing blood from  his ulcerative colitis.  Will transfuse him 3 units packed RBC.  GI consult  and evaluation, admit as inpatient, restart outpatient medications.           D:  04/02/2012 13:07 PM by Dr. Bartolo Darter. York Grice, MD [phone])  T:  04/02/2012 13:25 PM by       Everlean Cherry: [id number]) (Doc ID: [id number])

## 2012-04-02 NOTE — ED Notes (Signed)
Presents for shortness of breath which has progressively increased over the last month. States he went to his pmd Friday for the sx where he had blood work done. States he received a call from his pmd yesterday who told him to come to the ED for a possible blood transfusion. Denies dizziness. SOB increases with exertion. Denies blood in his stool or darker stool. C/o generalized weakness.

## 2012-04-02 NOTE — ED Notes (Signed)
Report attempted to Spine charge Joni Reining, and receiving RN [phone], neither are able to accept report at this time.

## 2012-04-02 NOTE — ED Provider Notes (Signed)
Physician/Midlevel provider first contact with patient: 04/02/12 0910         History     Chief Complaint   Patient presents with   . Shortness of Breath   . Anemia     HPI  79 y.o. M, PMHx GI bleed, sent here from PMD for anemia. Pt did lab work at PMD office 2 days ago, was called yesterday and told to come into ED for possible blood transfusion. Pt c/o progressively worsening fatigue and dyspnea on exertion x3 weeks. Pt also notes mild light headedness when standing. Pt denies any change in stool. Pt takes occasional advil, not on any blood thinners. Denies any other complaints.     PMD: Dr. Willaim Bane, Sm[facility] Houseman (Admits to [facility])  GI: Dr. Yvonna Alanis.      Past Medical History   Diagnosis Date   . Hypertensive disorder    . Ulcerative colitis, chronic    . Skin cancer    . Anemia      iron low   . Glaucoma NEC    . Vision abnormalities    . DOE (dyspnea on exertion)      mild       Past Surgical History   Procedure Date   . Eye surgery        No family history on file.    Social  History   Substance Use Topics   . Smoking status: Former Smoker -- 1.0 packs/day for 12 years     Quit date: 07/18/1961   . Smokeless tobacco: Not on file   . Alcohol Use: 0.6 oz/week     1 Shots of liquor per week      Comment: 2oz/d       .     No Known Allergies    Current/Home Medications    DORZOLAMIDE-TIMOLOL (COSOPT) 22.3-6.8 MG/ML OPHTHALMIC SOLUTION    Place 1 drop into both eyes 2 (two) times daily.    IRON PO    Take 200 mg by mouth daily.    LATANOPROST (XALATAN) 0.005 % OPHTHALMIC SOLUTION    Place 1 drop into both eyes nightly.    LISINOPRIL (PRINIVIL,ZESTRIL) 5 MG TABLET    Take by mouth daily.        Review of Systems   Constitutional: Positive for fatigue.        +anemia   Respiratory: Positive for shortness of breath.    Gastrointestinal: Negative for blood in stool.   Neurological: Positive for light-headedness.   All other systems reviewed and are negative.        Physical Exam    BP 116/66  Pulse 113  Temp 96.6 F (35.9 C)   Resp 18  Ht 1.702 m  Wt 62.143 kg  BMI 21.45 kg/m2  SpO2 100%    Physical Exam   Nursing note and vitals reviewed.  Constitutional: He is oriented to person, place, and time. He appears well-developed and well-nourished.   HENT:   Head: Normocephalic and atraumatic.   Eyes: Conjunctivae normal are normal. Pupils are equal, round, and reactive to light.   Neck: Normal range of motion. Neck supple.   Cardiovascular: Normal rate, regular rhythm and normal heart sounds.    Pulmonary/Chest: Effort normal and breath sounds normal. No respiratory distress.   Abdominal: Soft. There is no tenderness. There is no guarding.   Musculoskeletal: Normal range of motion. He exhibits no tenderness.   Neurological: He is alert and oriented to person, place, and time. GCS  eye subscore is 4. GCS verbal subscore is 5. GCS motor subscore is 6.        MAE   Skin: Skin is warm and dry. There is pallor.   Psychiatric: He has a normal mood and affect. His behavior is normal.       MDM and ED Course     ED Medication Orders      Start     Status Ordering Provider    04/02/12 1045   pantoprazole (PROTONIX) injection 80 mg   Once      Route: Intravenous  Ordered Dose: 80 mg         Ordered Alverta Caccamo B    04/02/12 1045   pantoprazole (PROTONIX) 80 mg in sodium chloride 0.9 % 100 mL infusion   Continuous      Route: Intravenous  Ordered Dose: 8 mg/hr         Ordered Lakashia Collison B               Results     Procedure Component Value Units Date/Time    Prepare RBC [[id number]] Collected:04/02/12 0922     RBC Leukoreduced [id number]    issued Updated:04/02/12 1328     RBC Leukoreduced [id number]    selected      RBC Leukoreduced [id number]    selected     Type and Screen [[id number]] Collected:04/02/12 0922    Specimen Information:Blood Updated:04/02/12 1113     ABO Rh O POS      Antibody Screen Gel NEG     CBC with Differential [[id number]]  (Abnormal) Collected:04/02/12 0922    Specimen Information:Blood / Blood Updated:04/02/12  1036     WBC 6.62 x10 3/uL      RBC 3.36 (L) x10 6/uL      Hgb 6.6 (L) g/dL      Hematocrit 64.3 (L) %      MCV 70.5 (L) fL      MCH 19.6 (L) pg      MCHC 27.8 (L) g/dL      RDW 18 (H) %      Platelets 288 x10 3/uL      MPV 9.4 fL      Neutrophils 75 %      Lymphocytes Automated 12 %      Monocytes 10 %      Eosinophils Automated 2 %      Basophils Automated 0 %      Immature Granulocyte 0 %      Nucleated RBC 1 /100 WBC      Neutrophils Absolute 4.97 x10 3/uL      Abs Lymph Automated 0.77 x10 3/uL      Abs Mono Automated 0.69 x10 3/uL      Abs Eos Automated 0.15 x10 3/uL      Absolute Baso Automated 0.03 x10 3/uL      Absolute Immature Granulocyte 0.01 x10 3/uL     Basic Metabolic Panel (BMP) [[id number]]  (Abnormal) Collected:04/02/12 0922    Specimen Information:Blood Updated:04/02/12 0956     Glucose 133 (H) mg/dL      BUN 66.0 (H) mg/dL      Creatinine 1.3 mg/dL      Calcium 8.5 mg/dL      Sodium 630 mEq/L      Potassium 3.8 mEq/L      Chloride 109 (H) mEq/L      CO2 18 (L) mEq/L     CK with MB if Indicated [[id number]] Collected:04/02/12  1610    Specimen Information:Blood Updated:04/02/12 0956     Creatine Kinase (CK) 84 U/L     GFR [[id number]] Collected:04/02/12 0922     EGFR 52.7   Updated:04/02/12 0956    i-Stat Troponin [[id number]] Collected:04/02/12 0925     i-STAT Troponin 0.03 ng/mL Updated:04/02/12 5621        Radiology Results (24 Hour)     ** No Results found for the last 24 hours. **            MDM  Number of Diagnoses or Management Options  Anemia:   Diagnosis management comments: All available results and their implications on this current complaint and disease process were discussed by me with the patient's available family in appropriate detail and language.    All available results and their implications on this current complaint and disease process were discussed by me with the patient at the bedside in appropriate detail and language.    10:00 AM paged Dr. Georga Bora for admission and Dr. Yvonna Alanis.    10:17 AM d/w Dr. York Grice (on call for [facility]) will accept to med tele. Also d/ Dr. Nino Glow (o/c for [facility]) aware of admission, will follow up with patient .   EKG: NSR, 95. Delayed transition, non specific changes in V5, V6. Unifocal PVCs.    Procedures    Clinical Impression & Disposition     Clinical Impression  Final diagnoses:   Anemia        ED Disposition     Admit Bed Type: Telemetry [5]  Admitting Physician: Schuyler Amor [[id number]]  Patient Class: Hospital Outpatient Surgery (Amb Proc) [106]             New Prescriptions    No medications on file        Treatment Team: Scribe: Cleon Dew  ____________________________________________________________________    I am scribing for Ruffin Pyo, MD on Calef,Finnley Valentino Hue (ED Scribe)  9:22 AM   04/02/2012    I am the first provider for this patient and I personally performed the services documented.  Treatment Team: Scribe: Cleon Dew is scribing for me on Leth,Oma E. This note accurately reflects work and decisions made by me    Ruffin Pyo, MD   9:22 AM    04/02/2012  ____________________________________________________________________          Ruffin Pyo, MD  04/03/12 1124

## 2012-04-02 NOTE — ED Notes (Signed)
Pt brought to rm 11 from triage, placed on C-monitor and admitted on C-monitor, pulse OX on the pt, call bell within reach

## 2012-04-02 NOTE — Consults (Signed)
[facility]  CONSULTATION NOTE    Date Time: 04/02/2012 1:00 PM  Patient Name: [name]  Requesting Physician: Schuyler Amor, MD       Reason for Consultation:   Anemia    Assessment and Plan:   Assessment:  1.  Symptomatic anemia - rule out slow GI bleed related to AVMs given prior history.  Rule out PUD given NSAID use, although less likely.    2.  History of numerous AVMs s/p APC in May 2013    Plan:  1.  Monitor H/H and for overt GI bleed.  Transfuse PRN and keep Hct >25.  2.  Protonix 40mg  IV BID.  3.  Clear liquid diet today.  NPO after MN.  4.  Bowel prep tonight and proceed with EGD and colonoscopy tomorrow, 04/03/12. The risks, benefits, potential complications and alternatives to the procedure were discussed.  The patient agrees to the procedure.      History:   [name] is a 79 y.o. male with history of GI bleed from AVMs who presents to the hospital on 04/02/2012 with symptomatic anemia.  He reports history of generalized weakness, fatigue, lightheadedness, dizziness ongoing over the past couple of months.  However, symptoms had progressed over the past couple of weeks with worsening fatigue and DOE.  He went to his PCP on Friday and had labs drawn.  Due to significant anemia, he was instructed to go to the ER for transfusion/evaluation.  He denies fevers, chills, nausea, vomiting, abdominal pain, chest pain, melena, blood in stool, dysphagia, diarrhea, constipation, change in bowel habits.  He does take Advil on occasion.  His last EGD and colonoscopy by Dr. Yvonna Alanis of our group demonstrated numerous AVMs in his upper and lower GI tract as reported below.      Past Medical History:     Past Medical History   Diagnosis Date   . Hypertensive disorder    . Ulcerative colitis, chronic    . Skin cancer    . Anemia      iron low   . Glaucoma NEC    . Vision abnormalities    . DOE (dyspnea on exertion)      mild       Past Surgical History:     Past Surgical History    Procedure Date   . Eye surgery        Family History:   History reviewed. No pertinent family history.    Social History:     History     Social History   . Marital Status: Married     Spouse Name: N/A     Number of Children: N/A   . Years of Education: N/A     Social History Main Topics   . Smoking status: Former Smoker -- 1.0 packs/day for 12 years     Quit date: 07/18/1961   . Smokeless tobacco: Not on file   . Alcohol Use: 0.6 oz/week     1 Shots of liquor per week      Comment: 2oz/d   . Drug Use:    . Sexually Active:      Other Topics Concern   . Not on file     Social History Narrative   . No narrative on file       Allergies:   No Known Allergies    Medications:     Current Facility-Administered Medications   Medication Dose Route Frequency   .  dorzolamide-timolol  1 drop Both Eyes BID   . latanoprost  1 drop Both Eyes QHS   . [COMPLETED] pantoprazole  80 mg Intravenous Once       Review of Systems:   Complete 12 Point ROS negative or normal except those mentioned in HPI above.    Physical Exam:     Filed Vitals:    04/02/12 1145   BP: 125/59   Pulse: 78   Temp: 95.9 F (35.5 C)   Resp: 16   SpO2: 100%       General appearance: Well developed, well nourished, appears stated age and in NAD  Eyes: Sclera anicteric, pink conjunctivae, no ptosis  ENMT: mucous membranes moist, nose and ears appear normal.  Oropharynx clear.  Chest: Non labored respirations, no audible wheezing, no clubbing or cyanosis  CV:  Regular rate and rhythm, no JVD, no LE edema  Abdomen: soft, non-tender, non-distended, no masses or organomegaly  Skin: Normal color and turgor, no rashes, no suspicious skin lesions noted  Neuro: CN II-XII grossly intact.  No gross movement disorders noted.  Mental status: Appropriate affect, alert and oriented x 3    Labs Reviewed:     Recent Labs   [facility] 04/02/12 0922    WBC 6.62    HGB 6.6*    HCT 23.7*    PLT 288    MCV 70.5*       Recent Labs   [facility] 04/02/12 0922    NA 137    K 3.8    CL 109*     CO2 18*    BUN 24.0*    CREAT 1.3    GLU 133*    CA 8.5    MG --    PHOS --       Radiology:   None    Endoscopy:   EGD 07/21/11:  Hiatal hernia, multiple AVMs in hiatal hernia, s/p APC, AVMs in stomach and duodenum, s/p APC  Colon 07/21/11:  AVMs in cecum, ascending colon, distal transverse colon s/p APC

## 2012-04-02 NOTE — Progress Notes (Signed)
1st and 2nd units transfused without reactions. Pt tolerated well. oob to chair most afternoon. Will start bowel prep this evening.

## 2012-04-02 NOTE — ED Notes (Signed)
Pt c/o feeling tired and exertional SOB but otherwise without complaint, denies CP weakness tachycardia diarrhea. Pt c/o cough productive scant yellow sputum, denies blood in stools or sputum. Mucous membranes pale, moist intact. Pt reports hx GI ulcer with cauterization, blood transfusion in surgery only. Pt has small purple bruises to arms and petechiae infrequently on entire body including face.

## 2012-04-03 ENCOUNTER — Encounter: Payer: Self-pay | Admitting: Anesthesiology

## 2012-04-03 ENCOUNTER — Encounter
Admission: EM | Disposition: A | Discharge status: Home / Self Care | Payer: Self-pay | Source: Home / Self Care | Attending: Emergency Medicine

## 2012-04-03 ENCOUNTER — Observation Stay: Payer: Medicare Other | Admitting: Anesthesiology

## 2012-04-03 HISTORY — PX: EGD, COLONOSCOPY: SHX3799

## 2012-04-03 LAB — HEMOGLOBIN AND HEMATOCRIT, BLOOD
Hematocrit: 33.3 % — ABNORMAL LOW (ref 42.0–52.0)
Hgb: 10.1 g/dL — ABNORMAL LOW (ref 13.0–17.0)

## 2012-04-03 LAB — ECG 12-LEAD
Atrial Rate: 94 {beats}/min
P Axis: 54 degrees
P-R Interval: 144 ms
Q-T Interval: 372 ms
QRS Duration: 96 ms
QTC Calculation (Bezet): 465 ms
R Axis: -33 degrees
T Axis: 105 degrees
Ventricular Rate: 94 {beats}/min

## 2012-04-03 SURGERY — EGD, COLONOSCOPY
Anesthesia: Anesthesia General | Site: Abdomen

## 2012-04-03 MED ORDER — PANTOPRAZOLE SODIUM 40 MG PO TBEC
40.0000 mg | DELAYED_RELEASE_TABLET | Freq: Two times a day (BID) | ORAL | Status: DC
Start: 2012-04-03 — End: 2012-04-03

## 2012-04-03 MED ORDER — PROPOFOL INFUSION 10 MG/ML
INTRAVENOUS | Status: DC | PRN
Start: 2012-04-03 — End: 2012-04-03
  Administered 2012-04-03: 200 ug/kg/min via INTRAVENOUS

## 2012-04-03 MED ORDER — PROPOFOL INFUSION 10 MG/ML
INTRAVENOUS | Status: DC | PRN
Start: 2012-04-03 — End: 2012-04-03
  Administered 2012-04-03: 100 mg via INTRAVENOUS

## 2012-04-03 MED ORDER — LACTATED RINGERS IV SOLN
INTRAVENOUS | Status: DC | PRN
Start: 2012-04-03 — End: 2012-04-03

## 2012-04-03 MED ORDER — PANTOPRAZOLE SODIUM 40 MG PO TBEC
40.0000 mg | DELAYED_RELEASE_TABLET | Freq: Every day | ORAL | Status: DC
Start: 2012-04-03 — End: 2012-04-03
  Administered 2012-04-03: 40 mg via ORAL
  Filled 2012-04-03: qty 1

## 2012-04-03 MED ORDER — PANTOPRAZOLE SODIUM 40 MG PO TBEC
40.0000 mg | DELAYED_RELEASE_TABLET | Freq: Two times a day (BID) | ORAL | Status: AC
Start: 2012-04-03 — End: 2013-04-03

## 2012-04-03 SURGICAL SUPPLY — 14 items
CONTAINER HISTOLOGY 60 ML 30 ML GRADUATE LEAK RESISTANT O RING PREFILL (Procedure Accessories) IMPLANT
ERBE ×1 IMPLANT
GOWN ISL PP PE REG LG LF FULL BCK NK TIE (Gown) ×2 IMPLANT
GOWN ISOLATION REGULAR LARGE FULL BACK NECK TIE ELASTIC CUFF (Gown) ×1 IMPLANT
SOL FORMALIN 10% PREFILL 30ML (Procedure Accessories)
SPONGE GAUZE L4 IN X W4 IN 4 PLY HIGH (Sponge) ×1
SPONGE GAUZE L4 IN X W4 IN 4 PLY NONWOVEN LINT FREE CURITY RAYON (Sponge) ×1 IMPLANT
SPONGE GZE RYN PLSTR CRTY 4X4IN LF NS 4 (Sponge) ×1
SYRINGE 50 ML GRADUATE NONPYROGENIC DEHP (Syringes, Needles) ×1
SYRINGE 50 ML GRADUATE NONPYROGENIC DEHP FREE PVC FREE BD MEDICAL (Syringes, Needles) ×1 IMPLANT
SYRINGE MED 50ML LF STRL GRAD N-PYRG (Syringes, Needles) ×1
WATER STERILE PLASTIC POUR BOTTLE 1000 (Irrigation Solutions) ×2
WATER STERILE PLASTIC POUR BOTTLE 1000 ML (Irrigation Solutions) ×2 IMPLANT
WATER STRL 1000ML LF PLS PR BTL (Irrigation Solutions) ×2

## 2012-04-03 NOTE — Transfer of Care (Signed)
Anesthesia Transfer of Care Note    Patient: [name]    Procedures performed: Procedure(s) with comments:  EGD, COLONOSCOPY    Anesthesia type: General TIVA    Patient location:Phase II PACU    Last vitals:   Filed Vitals:    04/03/12 1104   BP: 137/63   Pulse: 66   Temp: 97 F (36.1 C)   Resp: 18   SpO2: 98%       Post pain: Patient not complaining of pain, continue current therapy     Mental Status:awake    Respiratory Function: tolerating room air    Cardiovascular: stable    Nausea/Vomiting: patient not complaining of nausea or vomiting    Hydration Status: adequate    Post assessment: no apparent anesthetic complications

## 2012-04-03 NOTE — Discharge Instructions (Signed)
Mixing alcohol and pain medications can cause dizziness and slow your breathing.    Don't drink alcoholic beverages while taking pain medications.    Medication: Protonix  PROTONIX [generic name: pantoprazole] is in a class of drugs called "proton pump inhibitors". It is used for treatment of "heartburn" (acid reflux from the stomach causing pain in the esophagus (food tube)). This medicine reduces the amount of acid made in the stomach and allows the esophagus to heal.   Directions For Use:  -- This medicine is usually taken once a day with or without food. Swallow the tablet whole. Do not chew or break it in half. A full course of treatment is usually 8 weeks. If your symptoms do not improve with this drug or if they worsen while taking this drug, contact your doctor. Do not stop taking the medicine without first talking to your doctor.  -- If you miss a dose of this medicine, take it as soon as possible. If it is almost time for your next dose, skip the missed dose and resume your regular schedule. Do not take a double dose to make up for a missed dose.   What To Watch For:  Possible Side Effects: Upset stomach, headache, diarrhea, constipation, stomach pain, cough, back or neck or joint pain . Contact your doctor if symptoms persist or become severe. Signs of high blood sugar (thirst, dry mouth, frequent urination) . Contact your doctor. Chest pain, vomiting, yellowing of skin or eyes. Contact your doctor promptly or return to this facility.   Allergic Reactions: Rash, itching, swelling, trouble swallowing or breathing. Contact your doctor or return to this facility promptly.  Important  Medical Conditions: Before starting this medicine, be sure your doctor knows if you have any of the following conditions:  -- Pregnancy or plan to become pregnant, breast feeding, liver disease   Drug Interactions: Before starting this medicine, be sure your doctor knows if you are taking any of the following drugs:  --  Ampicillin, iron supplements including vitamins with iron, Nizoral (ketoconazole)  Warnings:   -- Tell your doctor if you are allergic to Prevacid ( lansoprazole), Prilosec (omeprazole), Aciphex (rabeprazole).   -- Tell your doctor if you are planning to have surgery (including dental surgery).   [NOTE: This information topic may not include all directions, precautions, medical conditions, drug/food interactions and warnings for this drug. Check with your doctor, nurse or pharmacist for any questions that you may have.]   [address], [place], [place] [address]. All rights reserved. This information is not intended as a substitute for professional medical care. Always follow your healthcare professional's instructions.

## 2012-04-03 NOTE — Anesthesia Postprocedure Evaluation (Signed)
Anesthesia Post Evaluation    Patient: [name]    Procedures performed: Procedure(s) with comments:  EGD, COLONOSCOPY    Anesthesia type: General TIVA    Patient location:Phase II PACU    Last vitals:   Filed Vitals:    04/03/12 1104   BP: 137/63   Pulse: 66   Temp: 97 F (36.1 C)   Resp: 18   SpO2: 98%       Post pain: Patient not complaining of pain, continue current therapy     Mental Status:awake    Respiratory Function: tolerating room air    Cardiovascular: stable    Nausea/Vomiting: patient not complaining of nausea or vomiting    Hydration Status: adequate    Post assessment: no apparent anesthetic complications

## 2012-04-03 NOTE — Progress Notes (Signed)
Pt returned from EGD and colonoscopy procedure at 1300. Pt alert and oriented and in no distress. Reports being hungry and able to drink fluids and eat solids foods without nausea or vomiting. Voiding qs. Vital signs stable.  Discharge instructions written and reviewed with pt including medications, medication reconciliation record, physician's discharge instructions and information. Pt has no complaints and very anxious to be discharged to home.

## 2012-04-03 NOTE — Anesthesia Preprocedure Evaluation (Signed)
Anesthesia Evaluation    AIRWAY    Mallampati: II    TM distance: >3 FB  Neck ROM: full  Mouth Opening:full   CARDIOVASCULAR    cardiovascular exam normal, regular and normal     DENTAL    No notable dental hx     PULMONARY    pulmonary exam normal and clear to auscultation     OTHER FINDINGS                  Anesthesia Plan    ASA 3   general   Detailed anesthesia plan: general IV      Post op pain management: per surgeon        intravenous induction     informed consent obtained

## 2012-04-03 NOTE — Plan of Care (Signed)
Problem: Health Promotion  Goal: Risk control - tobacco abuse  Actions to eliminate or reduce tobacco use.   Outcome: Completed Date Met:  04/03/12  Quit smoking [birth year] 1961

## 2012-04-03 NOTE — Progress Notes (Addendum)
[name] is a 79 y.o. male patient.  Active Problems:   * No active hospital problems. *     Past Medical History   Diagnosis Date   . Hypertensive disorder    . Ulcerative colitis, chronic    . Skin cancer    . Anemia      iron low   . Glaucoma NEC    . Vision abnormalities    . DOE (dyspnea on exertion)      mild     Current Facility-Administered Medications   Medication Dose Route Frequency Provider Last Rate Last Dose   . 0.9%  NaCl infusion   Intravenous Continuous Schuyler Amor, MD       . dextrose  5 % and 0.9 % NaCl infusion   Intravenous Continuous Schuyler Amor, MD 100 mL/hr at 04/03/12 0101     . dorzolamide-timolol (COSOPT) ophthalmic solution 1 drop  1 drop Both Eyes BID Schuyler Amor, MD   1 drop at 04/03/12 1001   . latanoprost (XALATAN) 0.005 % ophthalmic solution 1 drop  1 drop Both Eyes QHS Schuyler Amor, MD       . pantoprazole (PROTONIX) 80 mg in sodium chloride 0.9 % 100 mL infusion  8 mg/hr Intravenous Continuous Ruffin Pyo, MD 10 mL/hr at 04/03/12 0637 8 mg/hr at 04/03/12 0637   . [COMPLETED] polyethylene glycol (GoLYTELY,NuLYTELY) suspension 4,000 mL  4,000 mL Oral Once Hegab, Ahmed, MD   4,000 mL at 04/02/12 2145     No Known Allergies  Blood pressure 137/63, pulse 66, temperature 97 F (36.1 C), temperature source Oral, resp. rate 18, height 1.702 m (5\' 7" ), weight 62.143 kg (137 lb), SpO2 98.00%.    Subjective:  Symptoms:  Stable.    Diet:  NPO.    Activity level: Returning to normal.    Pain:  He reports no pain.      Objective:  General Appearance:  Well-appearing.    Vital signs: (most recent): Blood pressure 137/63, pulse 66, temperature 97 F (36.1 C), temperature source Oral, resp. rate 18, height 1.702 m (5\' 7" ), weight 62.143 kg (137 lb), SpO2 98.00%.  Vital signs are normal.    Output: Producing urine.    HEENT: Normal HEENT exam.    Lungs:  Normal respiratory rate and normal effort.  Breath sounds clear to auscultation.    Heart: Normal rate.  Regular rhythm.  S1  normal and S2 normal.    Extremities: Normal range of motion.    Neurological: Patient is alert.    Skin:  Warm and dry.    Abdomen: Abdomen is soft.    Bowel sounds:  Bowel sounds are normal.  There is no abdominal tenderness.     Pupils:  Pupils are equal, round, and reactive to light.    Pulses: Distal pulses are intact.      Results     Procedure Component Value Units Date/Time    Hemoglobin and hematocrit, blood [[id number]]  (Abnormal) Collected:04/03/12 0657    Specimen Information:Blood Updated:04/03/12 0729     Hgb 10.1 (L) g/dL      Hematocrit 08.6 (L) %     Prepare RBC [[id number]] Collected:04/02/12 0922     RBC Leukoreduced [id number]    transfused Updated:04/03/12 0042     RBC Leukoreduced [id number]    transfused      RBC Leukoreduced [id number]    transfused     Hemoglobin and hematocrit, blood [[id number]]  (Abnormal) Collected:04/02/12  2132    Specimen Information:Blood Updated:04/02/12 2154     Hgb 8.3 (L) g/dL      Hematocrit 16.1 (L) %         Assessment:  (GI bleed  Anemia symptomatic  H/o ulcerative colitis  htn    ).       Plan:   (Egd and colonoscopy today per gi  H/h stable  If ok  With GI will [place] home d/w rn.).       Schuyler Amor  04/03/2012

## 2012-04-03 NOTE — Plan of Care (Signed)
Pt received 3rd unit PRBC, tolerated well, all vss. Pt drank 3/4 of go-littlely, pt w/ clear stool, npo for colonoscopy and EGD. Pt w/ small amount of epistaxis, pt stated this is normal for him. Call light and needed items within reach, will cont hourly rounding.

## 2012-04-03 NOTE — H&P (Signed)
GI PRE PROCEDURE NOTE    Proceduralist Comments:   Review of Systems and Past Medical / Surgical History performed: Yes    Indications:Melena    Previous Adverse Reaction to Anesthesia or Sedation (if yes, describe): No    Physical Exam / Laboratory Data (If applicable)   Airway Classification: Class II    General: Alert and cooperative  Lungs: Lungs clear to auscultation  Cardiac: RRR, normal S1S2.    Abdomen: Soft, non tender. Normal active bowel sounds  Other:     Recent labs reviewed      American Society of Anesthesiologists (ASA) Physical Status Classification:   ASA 3 - Patient with moderate systemic disease with functional limitations    Planned Sedation:   Deep sedation with anesthesia    Attestation:   [name] has been reassessed immediately prior to the procedure and is an appropriate candidate for the planned sedation and procedure. Risks, benefits and alternatives to the planned procedure and sedation have been explained to the patient or guardian:  Yes        Signed by: Gerda Diss

## 2012-04-04 ENCOUNTER — Encounter: Payer: Self-pay | Admitting: Gastroenterology

## 2017-02-09 ENCOUNTER — Inpatient Hospital Stay
Admission: EM | Admit: 2017-02-09 | Discharge: 2017-02-23 | DRG: 981 | Disposition: A | Discharge status: Home / Self Care | Payer: Medicare Other | Attending: Specialist | Admitting: Specialist

## 2017-02-09 DIAGNOSIS — E785 Hyperlipidemia, unspecified: Secondary | ICD-10-CM | POA: Diagnosis present

## 2017-02-09 DIAGNOSIS — N179 Acute kidney failure, unspecified: Secondary | ICD-10-CM | POA: Diagnosis present

## 2017-02-09 DIAGNOSIS — K649 Unspecified hemorrhoids: Secondary | ICD-10-CM | POA: Diagnosis present

## 2017-02-09 DIAGNOSIS — I2510 Atherosclerotic heart disease of native coronary artery without angina pectoris: Secondary | ICD-10-CM | POA: Diagnosis present

## 2017-02-09 DIAGNOSIS — I272 Pulmonary hypertension, unspecified: Secondary | ICD-10-CM | POA: Diagnosis present

## 2017-02-09 DIAGNOSIS — D649 Anemia, unspecified: Secondary | ICD-10-CM | POA: Insufficient documentation

## 2017-02-09 DIAGNOSIS — D508 Other iron deficiency anemias: Secondary | ICD-10-CM | POA: Insufficient documentation

## 2017-02-09 DIAGNOSIS — I5023 Acute on chronic systolic (congestive) heart failure: Secondary | ICD-10-CM | POA: Diagnosis not present

## 2017-02-09 DIAGNOSIS — Z85828 Personal history of other malignant neoplasm of skin: Secondary | ICD-10-CM

## 2017-02-09 DIAGNOSIS — I340 Nonrheumatic mitral (valve) insufficiency: Secondary | ICD-10-CM | POA: Diagnosis present

## 2017-02-09 DIAGNOSIS — K5521 Angiodysplasia of colon with hemorrhage: Secondary | ICD-10-CM | POA: Diagnosis present

## 2017-02-09 DIAGNOSIS — I25119 Atherosclerotic heart disease of native coronary artery with unspecified angina pectoris: Secondary | ICD-10-CM

## 2017-02-09 DIAGNOSIS — Z8711 Personal history of peptic ulcer disease: Secondary | ICD-10-CM

## 2017-02-09 DIAGNOSIS — R5383 Other fatigue: Secondary | ICD-10-CM

## 2017-02-09 DIAGNOSIS — K922 Gastrointestinal hemorrhage, unspecified: Secondary | ICD-10-CM

## 2017-02-09 DIAGNOSIS — I480 Paroxysmal atrial fibrillation: Secondary | ICD-10-CM | POA: Diagnosis not present

## 2017-02-09 DIAGNOSIS — K5190 Ulcerative colitis, unspecified, without complications: Secondary | ICD-10-CM | POA: Diagnosis present

## 2017-02-09 DIAGNOSIS — I255 Ischemic cardiomyopathy: Secondary | ICD-10-CM | POA: Diagnosis present

## 2017-02-09 DIAGNOSIS — K31811 Angiodysplasia of stomach and duodenum with bleeding: Principal | ICD-10-CM | POA: Diagnosis present

## 2017-02-09 DIAGNOSIS — N183 Chronic kidney disease, stage 3 (moderate): Secondary | ICD-10-CM | POA: Diagnosis present

## 2017-02-09 DIAGNOSIS — I959 Hypotension, unspecified: Secondary | ICD-10-CM | POA: Diagnosis present

## 2017-02-09 DIAGNOSIS — D638 Anemia in other chronic diseases classified elsewhere: Secondary | ICD-10-CM | POA: Insufficient documentation

## 2017-02-09 DIAGNOSIS — I472 Ventricular tachycardia: Secondary | ICD-10-CM | POA: Diagnosis not present

## 2017-02-09 DIAGNOSIS — Z87891 Personal history of nicotine dependence: Secondary | ICD-10-CM

## 2017-02-09 DIAGNOSIS — D62 Acute posthemorrhagic anemia: Secondary | ICD-10-CM | POA: Diagnosis present

## 2017-02-09 DIAGNOSIS — I130 Hypertensive heart and chronic kidney disease with heart failure and stage 1 through stage 4 chronic kidney disease, or unspecified chronic kidney disease: Secondary | ICD-10-CM | POA: Diagnosis present

## 2017-02-09 DIAGNOSIS — I2582 Chronic total occlusion of coronary artery: Secondary | ICD-10-CM | POA: Diagnosis present

## 2017-02-09 LAB — PREPARE RBC
Expiration Date: 201812232359
Expiration Date: 201812232359
Status: TRANSFUSED
Status: TRANSFUSED
UTYPE: O POS
UTYPE: O POS

## 2017-02-09 LAB — ECG 12-LEAD
Atrial Rate: 153 {beats}/min
Q-T Interval: 318 ms
QRS Duration: 104 ms
QTC Calculation (Bezet): 467 ms
R Axis: -28 degrees
T Axis: 85 degrees
Ventricular Rate: 130 {beats}/min

## 2017-02-09 LAB — CBC AND DIFFERENTIAL
Absolute NRBC: 0.02 10*3/uL — ABNORMAL HIGH
Basophils Absolute Automated: 0.03 10*3/uL (ref 0.00–0.20)
Basophils Automated: 0.3 %
Eosinophils Absolute Automated: 0.03 10*3/uL (ref 0.00–0.70)
Eosinophils Automated: 0.3 %
Hematocrit: 24.7 % — ABNORMAL LOW (ref 42.0–52.0)
Hgb: 7.1 g/dL — ABNORMAL LOW (ref 13.0–17.0)
Immature Granulocytes Absolute: 0.04 10*3/uL
Immature Granulocytes: 0.5 %
Lymphocytes Absolute Automated: 0.37 10*3/uL — ABNORMAL LOW (ref 0.50–4.40)
Lymphocytes Automated: 4.3 %
MCH: 26.3 pg — ABNORMAL LOW (ref 28.0–32.0)
MCHC: 28.7 g/dL — ABNORMAL LOW (ref 32.0–36.0)
MCV: 91.5 fL (ref 80.0–100.0)
MPV: 10.7 fL (ref 9.4–12.3)
Monocytes Absolute Automated: 1.24 10*3/uL — ABNORMAL HIGH (ref 0.00–1.20)
Monocytes: 14.3 %
Neutrophils Absolute: 6.94 10*3/uL (ref 1.80–8.10)
Neutrophils: 80.3 %
Nucleated RBC: 0.2 /100 WBC (ref 0.0–1.0)
Platelets: 396 10*3/uL (ref 140–400)
RBC: 2.7 10*6/uL — ABNORMAL LOW (ref 4.70–6.00)
RDW: 15 % (ref 12–15)
WBC: 8.65 10*3/uL (ref 3.50–10.80)

## 2017-02-09 LAB — COMPREHENSIVE METABOLIC PANEL
ALT: 10 U/L (ref 0–55)
AST (SGOT): 12 U/L (ref 5–34)
Albumin/Globulin Ratio: 1.2 (ref 0.9–2.2)
Albumin: 3.6 g/dL (ref 3.5–5.0)
Alkaline Phosphatase: 71 U/L (ref 38–106)
BUN: 47 mg/dL — ABNORMAL HIGH (ref 9.0–28.0)
Bilirubin, Total: 0.3 mg/dL (ref 0.2–1.2)
CO2: 16 mEq/L — ABNORMAL LOW (ref 22–29)
Calcium: 8.9 mg/dL (ref 7.9–10.2)
Chloride: 105 mEq/L (ref 100–111)
Creatinine: 2.1 mg/dL — ABNORMAL HIGH (ref 0.7–1.3)
Globulin: 3.1 g/dL (ref 2.0–3.6)
Glucose: 102 mg/dL — ABNORMAL HIGH (ref 70–100)
Potassium: 4.7 mEq/L (ref 3.5–5.1)
Protein, Total: 6.7 g/dL (ref 6.0–8.3)
Sodium: 133 mEq/L — ABNORMAL LOW (ref 136–145)

## 2017-02-09 LAB — PT AND APTT
PT INR: 1.2 — ABNORMAL HIGH (ref 0.9–1.1)
PT: 14.9 s (ref 12.6–15.0)
PTT: 29 s (ref 23–37)

## 2017-02-09 LAB — GFR: EGFR: 30

## 2017-02-09 LAB — TYPE AND SCREEN
AB Screen Gel: NEGATIVE
ABO Rh: O POS

## 2017-02-09 MED ORDER — PANTOPRAZOLE SODIUM 40 MG IV SOLR
40.0000 mg | Freq: Two times a day (BID) | INTRAVENOUS | Status: DC
Start: 2017-02-10 — End: 2017-02-12
  Administered 2017-02-09 – 2017-02-12 (×6): 40 mg via INTRAVENOUS
  Filled 2017-02-09 (×6): qty 40

## 2017-02-09 MED ORDER — SODIUM CHLORIDE 0.9 % IV BOLUS
1000.0000 mL | Freq: Once | INTRAVENOUS | Status: AC
Start: 2017-02-09 — End: 2017-02-09
  Administered 2017-02-09: 1000 mL via INTRAVENOUS

## 2017-02-09 MED ORDER — ACETAMINOPHEN 325 MG PO TABS
650.0000 mg | ORAL_TABLET | ORAL | Status: DC | PRN
Start: 2017-02-09 — End: 2017-02-23

## 2017-02-09 MED ORDER — SODIUM CHLORIDE 0.9 % IV SOLN
INTRAVENOUS | Status: DC
Start: 2017-02-10 — End: 2017-02-12

## 2017-02-09 NOTE — Consults (Signed)
[facility])      Consultation Note    Date Time: 02/09/17 4:58 PM  Patient Name: [name],[name]  Attending Physician: Carmon Sails, MD  Primary Care Physician: Philipp Ovens, MD  Location/Room: N 27/N 27     [facility] Consult requested by: ER      Chief Complaint / Primary Reason for ICU evaluation :      Chief Complaint   Patient presents with   . Anemia   . Abnormal Lab     HPI: Patient is a 83 yo with prior history of gastric AVM who presents after his PCP found abnormal labs. Patient states that he has been feeling fatigued for the last 6 months. No change or increase in his symptoms. NO fevers chills abdo pain nausea vomiting sick contacts chest pain shortness of breath dizzyness lightheadedness. NO hemoptysis Hematemesis or melena. No dark tarry stool     In the ER they noticed one BP with systolic in the 70s and [facility] eval called           Assessment:     Problem List:   Active Hospital Problems    Diagnosis   . GI bleed     Anemia        Plan:     Patient does not have active bleed at this time. Likely has chronic bleed/ anemia. Patient had bedside orthostatics done which was not positive.  - Appreciate GI input. Will likely get an EGD and colonscopy in the future as per GI. Monitor H&H  - transfuse prbc to keep Hb > 7  - GI nutritional support  -  GI /DVT prophylaxis    Patient does not need ICU level of care at this time. Please call if condition changes. Discussed witH ER attending          Past Medical History:     Past Medical History:   Diagnosis Date   . Anemia     iron low   . DOE (dyspnea on exertion)     mild   . Glaucoma NEC    . Hypertensive disorder    . Skin cancer    . Ulcerative colitis, chronic    . Vision abnormalities        Available old records reviewed, including:      Past Surgical History:     Past Surgical History:   Procedure Laterality Date   . EGD, COLONOSCOPY  04/03/2012    Procedure: EGD, COLONOSCOPY;  Surgeon: Lestine Mount, MD;   Location: [id number];  Service: Gastroenterology;  Laterality: N/A;   . EYE SURGERY      cataract       Family History:   History reviewed. No pertinent family history.    Social History:     Social History     Social History   . Marital status: Married     Spouse name: N/A   . Number of children: N/A   . Years of education: N/A     Social History Main Topics   . Smoking status: Former Smoker     Packs/day: 1.00     Years: 12.00     Quit date: 12/18/1961   . Smokeless tobacco: Never Used   . Alcohol use 0.0 oz/week      Comment: 0.5oz/d   . Drug use: No   . Sexual activity: Not on file     Other Topics Concern   .  Not on file     Social History Narrative   . No narrative on file       Allergies:   No Known Allergies        Medications:     (Not in a hospital admission)    Current Inpatient :  Current Facility-Administered Medications   Medication Dose Route Frequency   . sodium chloride  1,000 mL Intravenous Once       Home Medications :     Prior to Admission medications    Medication Sig Start Date End Date Taking? Authorizing Provider   dorzolamide-timolol (COSOPT) 22.3-6.8 MG/ML ophthalmic solution Place 1 drop into both eyes 2 (two) times daily.    [provider]   IRON PO Take 200 mg by mouth daily.    [provider]   latanoprost (XALATAN) 0.005 % ophthalmic solution Place 1 drop into both eyes nightly.    [provider]   lisinopril (PRINIVIL,ZESTRIL) 5 MG tablet Take by mouth daily.    [provider]            Review of Systems:   All other systems were reviewed and are negative except:     Pertinent items are noted in HPI.    Physical Exam:     Vitals:    02/09/17 1651   BP: 109/65   Pulse: 97   Resp: 18   Temp:    SpO2: 98%       Intake and Output Summary (Last 24 hours) at Date Time  No intake or output data in the 24 hours ending 02/09/17 1658    General Appearance:  alert, well appearing, and in no distress and oriented to person, place, and time  Mental  status:  alert, oriented to person, place, and time, normal mood, behavior, speech, dress, motor activity, and thought processes  Neuro:  alert, oriented, normal speech, no focal findings or movement disorder noted, screening mental status exam normal, neck supple without rigidity, cranial nerves II through XII intact  Neck:supple, no significant adenopathy  Lungs: clear to auscultation, no wheezes, rales or rhonchi, symmetric air entry  Cardiac: normal rate, regular rhythm, normal S1, S2, no murmurs, rubs, clicks or gallops  Abdomen:  soft, nontender, nondistended, no masses or organomegaly  Extremities: peripheral pulses normal, no pedal edema, no clubbing or cyanosis                 Labs:       Labs (last 72 hours):  Recent Labs      02/09/17   1440   WBC  8.65   Hgb  7.1*   Hematocrit  24.7*     Recent Labs      02/09/17   1440   PT  14.9   PT INR  1.2*   PTT  29    Recent Labs      02/09/17   1440   Sodium  133*   Potassium  4.7   Chloride  105   CO2  16*   BUN  47.0*   Creatinine  2.1*   Glucose  102*   Calcium  8.9                   Radiology / Imaging:       Imaging personally reviewed by me  including:    No orders to display         Other ICU Data     Invasive  ICU Hemodynamics:        Line, Drains,Airway :   Patient Lines/Drains/Airways Status    Active PICC Line / CVC Line / PIV Line / Drain / Airway / Intraosseous Line / Epidural Line / ART Line / Line / Wound / Pressure Ulcer / NG/OG Tube     Name:   Placement date:   Placement time:   Site:   Days:    Peripheral IV 02/09/17 Left Antecubital  02/09/17    1358    Antecubital    less than 1                IBW:           I have personally reviewed the patient's history and 24 hour interval events, along with vitals, labs, radiology images and  additional findings found in detail within ICU team notes from house staff, NPPs and nursing, with their care plans developed with and reviewed by me.         Signed by: Sharyn Lull  AO:ZHYQ, Trish Fountain,  MD

## 2017-02-09 NOTE — Consults (Signed)
[facility]  CONSULTATION NOTE  [facility] call: [phone], [phone]  [facility] call: [phone]  After hours call [phone]        Date Time: 02/09/17 5:18 PM  Patient Name: EthanEthan [name]  Requesting Physician: Carmon Sails, MD       Reason for Consultation:   Symptomatic anemia    Assessment and Plan:   Assessment:  1. Symptomatic anemia: H/H 7.1/24.7 on presentation. Suspect may be secondary to GI bleed given history of multiple AVMs on prior upper endoscopy and colonoscopy (detailed below) requiring argon beam coagulation for treatment. He also has a history of a gastric ulcer. DDx includes PUD, erosive gastritis, esophagitis, AVM, ulcerated polyp, mass/malignancy.  2. Hypertension: Hypotension reported by ED staff    Plan:  1. Monitor H/H and for overt GI bleeding. Transfuse as necessary.  2. Recommend Protonix 40 mg IV BID.  3. Clear liquid diet.   4. Recommend EGD and colonoscopy for further evaluation of anemia. Given timing needed for prep, anticipate procedures on 02/11/17. The risks, benefits, potential complications and alternatives to the procedure were discussed.  The patient agrees to the procedure.  5. Golytely prep to begin tomorrow afternoon, and the pt should be NPO after midnight on Thursday night 02/11/17 (except for bowel prep) for procedures Friday.    History:   [name] is a 83 y.o. male who presents to the hospital on 02/09/2017 with anemia and dizziness. Per ED, rectal exam with dark heme positive stool.    The pt states he was aware of the anemia, as his labs had been checked by his PCP as an outpatient. The pt states he was advised to see GI Dr. Yvonna Alanis for evaluation, and he was scheduled for an appointment in the office next week. The pt reports he has occasional heartburn for which he takes OTC acid suppression, but he takes no PPI daily. The pt denies any anticoagulation use or use of aspirin or other NSAIDs. The pt denies seeing any black/tarry stools or  bright red blood in his stools, though he admits he has not been looking at his stools after passing them.    Past Medical History:     Past Medical History:   Diagnosis Date   . Anemia     iron low   . DOE (dyspnea on exertion)     mild   . Glaucoma NEC    . Hypertensive disorder    . Skin cancer    . Ulcerative colitis, chronic    . Vision abnormalities        Past Surgical History:     Past Surgical History:   Procedure Laterality Date   . EGD, COLONOSCOPY  04/03/2012    Procedure: EGD, COLONOSCOPY;  Surgeon: Lestine Mount, MD;  Location: [id number];  Service: Gastroenterology;  Laterality: N/A;   . EYE SURGERY      cataract       Family History:   History reviewed. No pertinent family history.    Social History:     Social History     Social History   . Marital status: Married     Spouse name: N/A   . Number of children: N/A   . Years of education: N/A     Social History Main Topics   . Smoking status: Former Smoker     Packs/day: 1.00     Years: 12.00     Quit date: 12/18/1961   . Smokeless tobacco: Never  Used   . Alcohol use 0.0 oz/week      Comment: 0.5oz/d   . Drug use: No   . Sexual activity: Not on file     Other Topics Concern   . Not on file     Social History Narrative   . No narrative on file       Allergies:   No Known Allergies    Medications:     Current Facility-Administered Medications   Medication Dose Route Frequency   . sodium chloride  1,000 mL Intravenous Once       Review of Systems:   General:  Patient denies lack of appetite, night sweats, fever.   HEENT:  Patient denies headache, hoarseness   Cardiovascular:  Patient denies swelling of hands/feet, chest pain.   Respiratory:  Patient denies chronic cough, wheezing.   Genitourinary:  Patient denies blood in urine, dark urine  Musculoskeletal: Patient denies joint pain, joint stiffness, joint swelling.   Skin:  Patient denies itching, rash.   Neurologic:  Patient reports dizziness, denies loss of consciousness, fainting, confusion   Heme/Lymphatic:  Patient denies easy bruising.       Pertinent positives noted in HPI.    Physical Exam:     Vitals:    02/09/17 1651   BP: 109/65   Pulse: 97   Resp: 18   Temp:    SpO2: 98%       General appearance: Well developed, well nourished, appears stated age and in NAD  Eyes: Sclera anicteric, pink conjunctivae, no ptosis  ENMT: mucous membranes moist, nose and ears appear normal.   Chest: Non labored respirations, no audible wheezing  CV:  Regular rate and rhythm  Abdomen: soft, non-tender, non-distended, no masses   Skin: +Pallor, Normal turgor, no rashes, no suspicious skin lesions noted  Neuro: CN II-XII grossly intact.  No gross movement disorders noted.  Mental status: Appropriate affect, alert and oriented x 3    Labs Reviewed:     Recent Labs      02/09/17   1440   WBC  8.65   Hgb  7.1*   Hematocrit  24.7*   Platelets  396   MCV  91.5       Recent Labs      02/09/17   1440   Sodium  133*   Potassium  4.7   Chloride  105   CO2  16*   BUN  47.0*   Creatinine  2.1*   Glucose  102*   Calcium  8.9       Recent Labs      02/09/17   1440   AST (SGOT)  12   ALT  10   Alkaline Phosphatase  71   Bilirubin, Total  0.3   Protein, Total  6.7   Albumin  3.6       Recent Labs      02/09/17   1440   PTT  29   PT  14.9   PT INR  1.2*        Radiology:   Radiological Procedure reviewed:    None available    Endoscopy:     Colonoscopy 04/03/12: Angioectasia/AVM found in the ascending colon. Argon beam coagulation was applied to control bleeding. Hemorrhoids found.    EGD 04/03/12: Areas of angioectasia/AVM in the body of the stomach. Argon beam coagulation was applied to control bleeding. An ulcer was found in the prepyloric region. Areas of angioectasia/AVM were found in  the duodenal bulb and 2nd portion of the duodenum.    Colonoscopy 07/21/11: Angioectasia/AVM found in the cecum, ascending colon and distal transverse colon. Argon beam coagulation used to control bleeding. Severe diverticulosis in the sigmoid colon.  Internal grade II hemorrhoids.     EGD 07/21/11: Multiple AVMs in the hiatal hernia treated with APC cautery. Areas of angioectasia/AVM were found in the body of the stomach and cardia. Argon beam coagulation was applied to control bleeding. Areas of angioectasia/AVM were found in the 2nd portion of the duodenum and 3rd portion of the duodenum. Argon beam coagulation was applied to control bleeding.

## 2017-02-09 NOTE — ED Provider Notes (Signed)
[facility] EMERGENCY DEPARTMENT H&P         CLINICAL SUMMARY          Diagnosis:    .     Final diagnoses:   Gastrointestinal hemorrhage, unspecified gastrointestinal hemorrhage type   Hypotension, unspecified hypotension type                 Disposition:      ED Disposition     ED Disposition Condition Date/Time Comment    Admit  Wed Feb 09, 2017  7:16 PM Admitting Physician: Schuyler Amor [[id number]]   Diagnosis: GI bleed [284132]   Estimated Length of Stay: > or = to 2 midnights   Tentative Discharge Plan?: Home or Self Care [1]   Patient Class: Inpatient [101]                         CLINICAL INFORMATION        HPI:      Chief Complaint: Anemia and Abnormal Lab  .    [name] is a 83 y.o. male with MHx of HTN, anemia and ulcerative colitis sent over by PCP who presents with abnormal lab result after having lab work done yesterday. Patient was told that he is severely anemic therefore to come here and get evaluated.     Daughter reports that patient has not been feeling well overall including dizziness recently.     No recent blood transfusion.   No alcohol.         History obtained from: patient, daughter      ROS:      Positive and negative ROS elements as per HPI.   All Other Systems Reviewed and Negative: Yes      Physical Exam:      Pulse (!) 108  BP 102/57  Resp 16  SpO2 99 %  Temp 97.1 F (36.2 C)    Physical Exam   Nursing note and vitals reviewed.   Constitutional: . Pt appears well-developed and well-nourished.  Pale appearing.  Eyes: Conjunctivae normal . No icterus  ENT: no nasal Chrisney mmm   Cardiovascular: Slight tachycardia. Systolic ejection murmur.    Pulmonary/Chest: Mild tachypnea with exertion.   GI: Soft. There is no guarding. There is no tenderness  Musculoskeletal: Normal range of motion. no deformity.   Neurological: Pt is alert and oriented to person, place, and time. GCS eye subscore is 4. GCS verbal subscore is 5. GCS motor subscore is 6. MAE   Skin: Skin is warm and dry.    Psychiatric: Pt has a normal  affect. Pt behavior is normal.             PAST HISTORY        Primary Care Provider: Philipp Ovens, MD        PMH/PSH:    .     Past Medical History:   Diagnosis Date   . Anemia     iron low   . DOE (dyspnea on exertion)     mild   . Glaucoma NEC    . Hypertensive disorder    . Skin cancer    . Ulcerative colitis, chronic    . Vision abnormalities        He has a past surgical history that includes Eye surgery and EGD, COLONOSCOPY (04/03/2012).      Social/Family History:      He reports that he quit smoking about 55  years ago. He has a 12.00 pack-year smoking history. He has never used smokeless tobacco. He reports that he drinks alcohol. He reports that he does not use drugs.    History reviewed. No pertinent family history.      Listed Medications on Arrival:    .     Previous Medications    IRON PO    Take 200 mg by mouth daily.    LISINOPRIL (PRINIVIL,ZESTRIL) 5 MG TABLET    Take by mouth daily.      Allergies: He has No Known Allergies.            VISIT INFORMATION                    Medications Given in the ED:    .     ED Medication Orders     Start Ordered     Status Ordering Provider    02/09/17 1543 02/09/17 1542  sodium chloride 0.9 % bolus 1,000 mL  Once     Route: Intravenous  Ordered Dose: 1,000 mL     Last MAR action:  Stopped Mckinleigh Schuchart E            Procedures:            Interpretations:      MDM:     Pulse Ox Analysis interpreted by me is 98% on RA nl without need for supplementation       Labs:I have reviewed and interpreted the labs at the time of visit. Dr Valere Dross      Radiologic Interpretation by me: No acute abnormalities**      Cardiac Monitor Analysis interpreted by me - Atrial fibrillation 90s to 100s      EKG: interpreted bye me - Afib at 130. Nl axis. No acute ischemic changes.       DDX: Dehydration, upper gastrointestinal bleed, lower gastrointestinal bleed, anemia    Clinical Course in the ED:      patient with low blood pressures noted after  discussing case with hospitalist with plan to admit to [facility] contacted [facility] who did come to evaluate patient and feel patient stable for admit to [facility].  Blood pressures improved.  She was continued to improve with continued hydration and blood transfusion observed in the ED for prolonged period of time   blood pressures transient back down to high 80s 90s, but then continued to remain stable    Patient evaluated in the emergency department by gastrointestinal with plan to scope in a few days      Critical Care Time(not including procedures): 98 minutes.   Due to the high risk of critical illness or multi-organ failure at initial presentation and/or during ED course.    System(s) at risk for compromise:  circulatory  Critical Diagnosis:   1. Gastrointestinal hemorrhage, unspecified gastrointestinal hemorrhage type    2. Hypotension, unspecified hypotension type         The patient was Hypotensive:   Yes   The patient was Hypoxic:   No     This does not including time spent performing other reported procedures or services.   Critical care time involved full attention to the patient's condition and included:   Review of nursing notes and/or old charts - Yes  Documentation time - Yes  Care, transfer of care, and discharge plans - Yes  Obtaining necessary history from family, EMS, nursing home staff and/or treating physicians - Yes  Review of medications,  allergies, and vital signs - Yes   Consultant collaboration on findings and treatment options - Yes  Ordering, interpreting, and reviewing diagnostic studies/tab tests - Yes         Discharge Prescriptions     None                  RESULTS        Lab Results:      Results     Procedure Component Value Units Date/Time    Prepare RBC: one unit [[id number]] Collected:  02/09/17 1440     Updated:  02/09/17 1915    Prepare RBC: one unit [[id number]] Collected:  02/09/17 1440     Updated:  02/09/17 1735     RBC Leukoreduced RBC Leukoreduced     BLUNIT [id number]      Status issued     PRODUCT CODE (NON READABLE) E4533V00     Expiration Date [id number]     UTYPE O POS    Type and Screen [[id number]] Collected:  02/09/17 1440    Specimen:  Blood Updated:  02/09/17 1533     ABO Rh O POS     AB Screen Gel NEG    PT/APTT [[id number]]  (Abnormal) Collected:  02/09/17 1440     Updated:  02/09/17 1520     PT 14.9 sec      PT INR 1.2 (H)     PT Anticoag. Given Within 48 hrs. None     PTT 29 sec     Comprehensive metabolic panel [[id number]]  (Abnormal) Collected:  02/09/17 1440    Specimen:  Blood Updated:  02/09/17 1511     Glucose 102 (H) mg/dL      BUN 38.7 (H) mg/dL      Creatinine 2.1 (H) mg/dL      Sodium 564 (L) mEq/L      Potassium 4.7 mEq/L      Chloride 105 mEq/L      CO2 16 (L) mEq/L      Calcium 8.9 mg/dL      Protein, Total 6.7 g/dL      Albumin 3.6 g/dL      AST (SGOT) 12 U/L      ALT 10 U/L      Alkaline Phosphatase 71 U/L      Bilirubin, Total 0.3 mg/dL      Globulin 3.1 g/dL      Albumin/Globulin Ratio 1.2    GFR [[id number]] Collected:  02/09/17 1440     Updated:  02/09/17 1511     EGFR 30.0    CBC with differential [[id number]]  (Abnormal) Collected:  02/09/17 1440    Specimen:  Blood from Blood Updated:  02/09/17 1458     WBC 8.65 x10 3/uL      Hgb 7.1 (L) g/dL      Hematocrit 01.0 (L) %      Platelets 396 x10 3/uL      RBC 2.70 (L) x10 6/uL      MCV 91.5 fL      MCH 26.3 (L) pg      MCHC 28.7 (L) g/dL      RDW 15 %      MPV 10.7 fL      Neutrophils 80.3 %      Lymphocytes Automated 4.3 %      Monocytes 14.3 %      Eosinophils Automated 0.3 %      Basophils Automated 0.3 %  Immature Granulocyte 0.5 %      Nucleated RBC 0.2 /100 WBC      Neutrophils Absolute 6.94 x10 3/uL      Abs Lymph Automated 0.37 (L) x10 3/uL      Abs Mono Automated 1.24 (H) x10 3/uL      Abs Eos Automated 0.03 x10 3/uL      Absolute Baso Automated 0.03 x10 3/uL      Absolute Immature Granulocyte 0.04 x10 3/uL      Absolute NRBC 0.02 (H) x10 3/uL               Radiology Results:      No orders to  display               Scribe Attestation:      I was acting as a Neurosurgeon for Carmon Sails, MD on Osberg,Brysan Jacquiline Doe     I am the first provider for this patient and I personally performed the services documented. Arcola Jansky is scribing for me on Kaelin,Willem E. This note and the patient instructions accurately reflect work and decisions made by me.  Carmon Sails, MD            Carmon Sails, MD  02/09/17 [phone]

## 2017-02-10 ENCOUNTER — Inpatient Hospital Stay: Payer: Medicare Other

## 2017-02-10 LAB — COMPREHENSIVE METABOLIC PANEL
ALT: 8 U/L (ref 0–55)
AST (SGOT): 12 U/L (ref 5–34)
Albumin/Globulin Ratio: 1.3 (ref 0.9–2.2)
Albumin: 3.2 g/dL — ABNORMAL LOW (ref 3.5–5.0)
Alkaline Phosphatase: 62 U/L (ref 38–106)
BUN: 41 mg/dL — ABNORMAL HIGH (ref 9.0–28.0)
Bilirubin, Total: 0.6 mg/dL (ref 0.2–1.2)
CO2: 16 mEq/L — ABNORMAL LOW (ref 22–29)
Calcium: 8.1 mg/dL (ref 7.9–10.2)
Chloride: 109 mEq/L (ref 100–111)
Creatinine: 1.6 mg/dL — ABNORMAL HIGH (ref 0.7–1.3)
Globulin: 2.4 g/dL (ref 2.0–3.6)
Glucose: 85 mg/dL (ref 70–100)
Potassium: 4.6 mEq/L (ref 3.5–5.1)
Protein, Total: 5.6 g/dL — ABNORMAL LOW (ref 6.0–8.3)
Sodium: 135 mEq/L — ABNORMAL LOW (ref 136–145)

## 2017-02-10 LAB — CBC AND DIFFERENTIAL
Absolute NRBC: 0.02 10*3/uL — ABNORMAL HIGH
Basophils Absolute Automated: 0.03 10*3/uL (ref 0.00–0.20)
Basophils Automated: 0.4 %
Eosinophils Absolute Automated: 0.05 10*3/uL (ref 0.00–0.70)
Eosinophils Automated: 0.6 %
Hematocrit: 28.1 % — ABNORMAL LOW (ref 42.0–52.0)
Hgb: 8.3 g/dL — ABNORMAL LOW (ref 13.0–17.0)
Immature Granulocytes Absolute: 0.03 10*3/uL
Immature Granulocytes: 0.4 %
Lymphocytes Absolute Automated: 0.41 10*3/uL — ABNORMAL LOW (ref 0.50–4.40)
Lymphocytes Automated: 5.2 %
MCH: 26.3 pg — ABNORMAL LOW (ref 28.0–32.0)
MCHC: 29.5 g/dL — ABNORMAL LOW (ref 32.0–36.0)
MCV: 88.9 fL (ref 80.0–100.0)
MPV: 10.2 fL (ref 9.4–12.3)
Monocytes Absolute Automated: 0.98 10*3/uL (ref 0.00–1.20)
Monocytes: 12.4 %
Neutrophils Absolute: 6.41 10*3/uL (ref 1.80–8.10)
Neutrophils: 81 %
Nucleated RBC: 0.3 /100 WBC (ref 0.0–1.0)
Platelets: 284 10*3/uL (ref 140–400)
RBC: 3.16 10*6/uL — ABNORMAL LOW (ref 4.70–6.00)
RDW: 15 % (ref 12–15)
WBC: 7.91 10*3/uL (ref 3.50–10.80)

## 2017-02-10 LAB — URINALYSIS WITH MICROSCOPIC
Bilirubin, UA: NEGATIVE
Blood, UA: NEGATIVE
Glucose, UA: NEGATIVE
Ketones UA: NEGATIVE
Leukocyte Esterase, UA: NEGATIVE
Nitrite, UA: NEGATIVE
Protein, UR: NEGATIVE
Specific Gravity UA: 1.008 (ref 1.001–1.035)
Urine pH: 6 (ref 5.0–8.0)
Urobilinogen, UA: NORMAL mg/dL

## 2017-02-10 LAB — HEMOGLOBIN AND HEMATOCRIT, BLOOD
Hematocrit: 30.7 % — ABNORMAL LOW (ref 42.0–52.0)
Hematocrit: 30.9 % — ABNORMAL LOW (ref 42.0–52.0)
Hgb: 9.4 g/dL — ABNORMAL LOW (ref 13.0–17.0)
Hgb: 9.2 g/dL — ABNORMAL LOW (ref 13.0–17.0)

## 2017-02-10 LAB — B-TYPE NATRIURETIC PEPTIDE: B-Natriuretic Peptide: 2921 pg/mL — ABNORMAL HIGH (ref 0–100)

## 2017-02-10 LAB — GFR: EGFR: 41

## 2017-02-10 MED ORDER — FUROSEMIDE 10 MG/ML IJ SOLN
20.0000 mg | Freq: Once | INTRAMUSCULAR | Status: AC
Start: 2017-02-10 — End: 2017-02-10
  Administered 2017-02-10: 08:00:00 20 mg via INTRAVENOUS
  Filled 2017-02-10: qty 4

## 2017-02-10 MED ORDER — PEG 3350-KCL-NABCB-NACL-NASULF 236 G PO SOLR
3000.0000 mL | Freq: Once | ORAL | Status: AC
Start: 2017-02-10 — End: 2017-02-10
  Administered 2017-02-10: 17:00:00 3000 mL via ORAL
  Filled 2017-02-10: qty 4000

## 2017-02-10 MED ORDER — METOPROLOL TARTRATE 25 MG PO TABS
25.0000 mg | ORAL_TABLET | Freq: Two times a day (BID) | ORAL | Status: DC
Start: 2017-02-10 — End: 2017-02-12
  Administered 2017-02-10 – 2017-02-12 (×3): 25 mg via ORAL
  Filled 2017-02-10 (×4): qty 1

## 2017-02-10 MED ORDER — PEG 3350-KCL-NABCB-NACL-NASULF 236 G PO SOLR
2000.0000 mL | Freq: Once | ORAL | Status: AC
Start: 2017-02-11 — End: 2017-02-11
  Administered 2017-02-11: 03:00:00 2000 mL via ORAL
  Filled 2017-02-10: qty 4000

## 2017-02-10 NOTE — Plan of Care (Signed)
Care Plan Notes          [facility] Nursing Progress Note    [name] is a 83 y.o. male  Admitted 02/09/2017  2:30 PM [facility] day 1)    Indication for continued [facility] status:  None; pt has transfer orders.    Mews Score: 3  Actions taken for MEWS if >4: n/a        Major Shift Events:  Pt downgraded to NT 9.42. Report called to French Ana, Charity fundraiser. All patient belongings, chart, and meds sent with transport. Pt's daughter telephoned and updated.    Review of Systems  Neuro:  A/O x4, FC, MAE, PERRL, denies pain. OOB with x1 assist. Ambulates with SBA.    Cardiac:  BP 101/55   Pulse 65   Temp 97.9 F (36.6 C) (Oral)   Resp (!) 27   Ht 1.676 m (5\' 6" )   Wt 60.3 kg (132 lb 15 oz)   SpO2 98%   BMI 21.46 kg/m   See FS for VS. NSR with PACs, afebrile, normotensive, +pulses, no edema. 10-beat run of V-tach around 1200 today. Cardiology aware.    Respiratory:  3L NC, no desats. Lungs CTAB.    GI/GU:  Clear liquid diet, NPO past midnight. AUOP via urinal.    BM this shift? no    Skin Assessment  Skin intact      LDAs  Patient Lines/Drains/Airways Status    Active Lines, Drains and Airways     Name:   Placement date:   Placement time:   Site:   Days:    Peripheral IV 02/09/17 Right Antecubital  02/09/17    1736    Antecubital    less than 1    Peripheral IV 02/09/17 Left Hand  02/09/17    1805    Hand    less than 1                    Indication for Central Access and estimated target removal date?  n/a      Indication for Foley and estimated target removal date?  n/a      Psycho/Social:  WNL    Interpreter Services:  Does the patient require an Interpreter? n/a    If so, what type of interpreter was used? n/a    If the family was utilized, is the interpreter waiver signed and located in the chart? n/a

## 2017-02-10 NOTE — Consults (Signed)
[facility]   Fynley Chrystal M. Lucianne Muss, MD, [facility]  Office: [phone]  Cell phone [phone]        Date Time: 02/10/17 12:35 PM  Patient Name: [name],[name]  Requesting Physician: Schuyler Amor, MD       Reason for Consultation:   Ventricular tachycardia  Assessment:    Ventricular tachycardia, non-sustained, recorded on tele on 02/10/17 at 12:07- asymptomatic and hemodynamically stable.    Atrial fibrillation, new onset   GI bleeding  Plan:    EKG   Tn   BNP   Lipid profile   Echo   Electrolytes   Hold off on anticoagulation for AF as he may be having active GI bleeding.   Metoprolol 25 mg bid  History:   SHERMAINE BRIGHAM is a 83 y.o. male who presents to the hospital on 02/09/2017 with dizziness.  He was found to have dark heme positive stool and possible GI bleeding. He was found to have ventricular tachycardia and atrial fibrillation and we are consulted to address those. He is currently asymptomatic and denies any CP, SOB or palpitation.   Past Medical History:     Past Medical History:   Diagnosis Date   . Anemia     iron low   . DOE (dyspnea on exertion)     mild   . Glaucoma NEC    . Hypertensive disorder    . Skin cancer    . Ulcerative colitis, chronic    . Vision abnormalities        Past Surgical History:     Past Surgical History:   Procedure Laterality Date   . EGD, COLONOSCOPY  04/03/2012    Procedure: EGD, COLONOSCOPY;  Surgeon: Lestine Mount, MD;  Location: [facility];  Service: Gastroenterology;  Laterality: N/A;   . EYE SURGERY      cataract       Family History:   History reviewed. No pertinent family history.    Social History:     Social History     Social History   . Marital status: Married     Spouse name: N/A   . Number of children: N/A   . Years of education: N/A     Social History Main Topics   . Smoking status: Former Smoker     Packs/day: 1.00     Years: 12.00     Quit date: 12/18/1961   . Smokeless tobacco: Never Used   . Alcohol use 0.0 oz/week      Comment: 0.5oz/d    . Drug use: No   . Sexual activity: Not on file     Other Topics Concern   . Not on file     Social History Narrative   . No narrative on file       Allergies:   No Known Allergies    Medications:     Current Facility-Administered Medications   Medication Dose Route Frequency   . pantoprazole  40 mg Intravenous BID   . [START ON 02/11/2017] polyethylene glycol  2,000 mL Oral Once   . polyethylene glycol  3,000 mL Oral Once       Review of Systems:   Pertinent items are noted in HPI.    Physical Exam:   BP 115/66   Pulse 79   Temp 97.9 F (36.6 C) (Oral)   Resp (!) 24   Ht 1.676 m (5\' 6" )   Wt 60.3 kg (132 lb 15 oz)  SpO2 98%   BMI 21.46 kg/m   General: awake, alert, oriented x 3; no acute distress.   HEENT: no pallor, sclera anicteric oropharynx clear without lesions, mucous membranes moist   Neck: supple, no lymphadenopathy, no thyromegaly, no JVD, no carotid bruits   Cardiovascular: regular rate and rhythm, no murmurs, rubs or gallops   Lungs: clear to auscultation bilaterally, without wheezing, rhonchi, or rales   Abdomen: soft, non-tender, non-distended; normoactive bowel sounds  Extremities: no clubbing, cyanosis, or edema   Neuro: cranial nerves grossly intact, no focal findings   Skin:Warm, dry      Intake and Output Summary (Last 24 hours) at Date Time    Intake/Output Summary (Last 24 hours) at 02/10/17 1235  Last data filed at 02/10/17 1200   Gross per 24 hour   Intake           2892.5 ml   Output             1300 ml   Net           1592.5 ml         Labs Reviewed:     Results     Procedure Component Value Units Date/Time    Urinalysis with microscopic [[id number]] Collected:  02/10/17 1217    Specimen:  Urine Updated:  02/10/17 1217    Hemoglobin and hematocrit, blood [[id number]]  (Abnormal) Collected:  02/10/17 0806    Specimen:  Blood Updated:  02/10/17 0828     Hgb 9.4 (L) g/dL      Hematocrit 78.2 (L) %     Comprehensive metabolic panel [[id number]]  (Abnormal) Collected:  02/10/17 0152     Specimen:  Blood Updated:  02/10/17 0239     Glucose 85 mg/dL      BUN 57.8 (H) mg/dL      Creatinine 1.6 (H) mg/dL      Sodium 469 (L) mEq/L      Potassium 4.6 mEq/L      Chloride 109 mEq/L      CO2 16 (L) mEq/L      Calcium 8.1 mg/dL      Protein, Total 5.6 (L) g/dL      Albumin 3.2 (L) g/dL      AST (SGOT) 12 U/L      ALT 8 U/L      Alkaline Phosphatase 62 U/L      Bilirubin, Total 0.6 mg/dL      Globulin 2.4 g/dL      Albumin/Globulin Ratio 1.3    GFR [[id number]] Collected:  02/10/17 0152     Updated:  02/10/17 0239     EGFR 41.0    CBC and differential [[id number]]  (Abnormal) Collected:  02/10/17 0152    Specimen:  Blood from Blood Updated:  02/10/17 0222     WBC 7.91 x10 3/uL      Hgb 8.3 (L) g/dL      Hematocrit 53.6 (L) %      Platelets 284 x10 3/uL      RBC 3.16 (L) x10 6/uL      MCV 88.9 fL      MCH 26.3 (L) pg      MCHC 29.5 (L) g/dL      RDW 15 %      MPV 10.2 fL      Neutrophils 81.0 %      Lymphocytes Automated 5.2 %      Monocytes 12.4 %      Eosinophils Automated  0.6 %      Basophils Automated 0.4 %      Immature Granulocyte 0.4 %      Nucleated RBC 0.3 /100 WBC      Neutrophils Absolute 6.41 x10 3/uL      Abs Lymph Automated 0.41 (L) x10 3/uL      Abs Mono Automated 0.98 x10 3/uL      Abs Eos Automated 0.05 x10 3/uL      Absolute Baso Automated 0.03 x10 3/uL      Absolute Immature Granulocyte 0.03 x10 3/uL      Absolute NRBC 0.02 (H) x10 3/uL     MRSA culture [[id number]] Collected:  02/09/17 2319    Specimen:  Body Fluid from Nares and Throat Updated:  02/10/17 0152    Prepare RBC: one unit [[id number]] Collected:  02/09/17 1440     Updated:  02/10/17 0042     RBC Leukoreduced RBC Leukoreduced     BLUNIT [id number]     Status transfused     PRODUCT CODE (NON READABLE) E4533V00     Expiration Date [id number]     UTYPE O POS    Prepare RBC: one unit [[id number]] Collected:  02/09/17 1440     Updated:  02/10/17 0042     RBC Leukoreduced RBC Leukoreduced     BLUNIT [id number]     Status transfused      PRODUCT CODE (NON READABLE) E4532V00     Expiration Date [id number]     UTYPE O POS    Type and Screen [[id number]] Collected:  02/09/17 1440    Specimen:  Blood Updated:  02/09/17 1533     ABO Rh O POS     AB Screen Gel NEG    PT/APTT [[id number]]  (Abnormal) Collected:  02/09/17 1440     Updated:  02/09/17 1520     PT 14.9 sec      PT INR 1.2 (H)     PT Anticoag. Given Within 48 hrs. None     PTT 29 sec     Comprehensive metabolic panel [[id number]]  (Abnormal) Collected:  02/09/17 1440    Specimen:  Blood Updated:  02/09/17 1511     Glucose 102 (H) mg/dL      BUN 32.2 (H) mg/dL      Creatinine 2.1 (H) mg/dL      Sodium 025 (L) mEq/L      Potassium 4.7 mEq/L      Chloride 105 mEq/L      CO2 16 (L) mEq/L      Calcium 8.9 mg/dL      Protein, Total 6.7 g/dL      Albumin 3.6 g/dL      AST (SGOT) 12 U/L      ALT 10 U/L      Alkaline Phosphatase 71 U/L      Bilirubin, Total 0.3 mg/dL      Globulin 3.1 g/dL      Albumin/Globulin Ratio 1.2    GFR [[id number]] Collected:  02/09/17 1440     Updated:  02/09/17 1511     EGFR 30.0    CBC with differential [[id number]]  (Abnormal) Collected:  02/09/17 1440    Specimen:  Blood from Blood Updated:  02/09/17 1458     WBC 8.65 x10 3/uL      Hgb 7.1 (L) g/dL      Hematocrit 60.7 (L) %      Platelets 396 x10 3/uL      RBC 2.70 (  L) x10 6/uL      MCV 91.5 fL      MCH 26.3 (L) pg      MCHC 28.7 (L) g/dL      RDW 15 %      MPV 10.7 fL      Neutrophils 80.3 %      Lymphocytes Automated 4.3 %      Monocytes 14.3 %      Eosinophils Automated 0.3 %      Basophils Automated 0.3 %      Immature Granulocyte 0.5 %      Nucleated RBC 0.2 /100 WBC      Neutrophils Absolute 6.94 x10 3/uL      Abs Lymph Automated 0.37 (L) x10 3/uL      Abs Mono Automated 1.24 (H) x10 3/uL      Abs Eos Automated 0.03 x10 3/uL      Absolute Baso Automated 0.03 x10 3/uL      Absolute Immature Granulocyte 0.04 x10 3/uL      Absolute NRBC 0.02 (H) x10 3/uL                     Signed by: Valerie Roys, MD, [facility]

## 2017-02-10 NOTE — Progress Notes (Signed)
Medicine Shift Note    Patient Lines/Drains/Airways Status    Active Lines, Drains and Airways     Name:   Placement date:   Placement time:   Site:   Days:    Peripheral IV 02/09/17 Left Hand  02/09/17    1805    Hand    less than 1                Last BM: 02-09-17    Pending Orders: ECHO, EGD/colonoscopy in am    Discharge Plan: Home when medically stable    POC: Pt aware    Skin: CDI    Tele: yes, anemia upon admission (Hg 7.1)    Activity: up with CG assist    Interpreter Needs: no, Used: n/a, Method: n/a, Waiver Need/present: n/a    Shift Note:    Pt up with CG assist; voids in Br adequate amounts; ;last BM 02-09-17; no c/o pain; tolerating clear liquid diet without effect, pt to be NPO after midnight for planned EGD/colonoscopy in am, bowel prep initiated at 1800; Continue with Q8 hr H&H, next at midnight; VSS other than pt requiring 2L O2 to maintain sats;  TELE shows NSR (11 beat run of v-tach last HS per [facility] RN); s/p 2 UPRBCs, current H&H 9.4 and 30.7, however pt now exhibits mild CHF via CXR with a BNP of 2921, hold IVF for now; A&O X4; continue POC.

## 2017-02-10 NOTE — H&P (Signed)
ADMISSION HISTORY AND PHYSICAL EXAM    Date Time: 02/10/17 10:30 AM  Patient Name: [name]  Attending Physician: Schuyler Amor, MD    Assessment:   Symptomatic anemia   Tachycardia   Hypotension   Chronic ulcerative colitis   AKI multifactorial   Plan:   Continue h/h Q8hr  Seen by GI plan for golytely starting today and EGD and colonoscopy tomorrow  Clear liquid diet   Hold further IVF received lasix IV today monitor urine out put   CXR today   UA   Cardiology consult remained tachycardiac   Cardiac echo   SCD for DVT prophylaxis   Case d/w patient and staff care coordinated   History of Present Illness:   [name] is a 83 y.o. male who presents to the hospital with abnormal labs with severe anemia in ED he was hypotensive seen by [facility] for evaluation of admission to ICU however his repeat BP showed improvement and he is  admitted to [facility]   He c/o feeling weak and dizziness   Denies any abdominal pain , nausea vomiting   No hematemesis melena or hematochezia  No hemoptysis, cough or fever   No cp or SOB   Rest of ROS  negative    Patient was seen by me in the ED last night however he became hypotensive and tachycardiac and [facility] was consulted for possible ICU admission   Past Medical History:     Past Medical History:   Diagnosis Date   . Anemia     iron low   . DOE (dyspnea on exertion)     mild   . Glaucoma NEC    . Hypertensive disorder    . Skin cancer    . Ulcerative colitis, chronic    . Vision abnormalities        Past Surgical History:     Past Surgical History:   Procedure Laterality Date   . EGD, COLONOSCOPY  04/03/2012    Procedure: EGD, COLONOSCOPY;  Surgeon: Lestine Mount, MD;  Location: [facility];  Service: Gastroenterology;  Laterality: N/A;   . EYE SURGERY      cataract       Family History:   History reviewed. No pertinent family history.    Social History:     Social History     Social History   . Marital status: Married     Spouse name: N/A   . Number of children: N/A   . Years of  education: N/A     Social History Main Topics   . Smoking status: Former Smoker     Packs/day: 1.00     Years: 12.00     Quit date: 12/18/1961   . Smokeless tobacco: Never Used   . Alcohol use 0.0 oz/week      Comment: 0.5oz/d   . Drug use: No   . Sexual activity: Not on file     Other Topics Concern   . Not on file     Social History Narrative   . No narrative on file       Allergies:   No Known Allergies    Medications:     Prescriptions Prior to Admission   Medication Sig   . IRON PO Take 200 mg by mouth daily.   Marland Kitchen lisinopril (PRINIVIL,ZESTRIL) 5 MG tablet Take by mouth daily.       Review of Systems:   A comprehensive review of systems was: per HPI  Physical Exam:     Vitals:    02/10/17 1000   BP: 118/70   Pulse: (!) 104   Resp: (!) 32   Temp:    SpO2: 94%       Intake and Output Summary (Last 24 hours) at Date Time    Intake/Output Summary (Last 24 hours) at 02/10/17 1030  Last data filed at 02/10/17 1000   Gross per 24 hour   Intake           2892.5 ml   Output              625 ml   Net           2267.5 ml       General appearance - alert, well appearing, and in no distress  Mouth - mucous membranes moist, pharynx normal without lesions  Neck - supple, no significant adenopathy  Lymphatics - no palpable lymphadenopathy, no hepatosplenomegaly  Chest - clear to auscultation, no wheezes, rales or rhonchi, symmetric air entry  Heart - S1 and S2 normal, no murmurs noted, no gallops noted, no JVD, tachycardic   Abdomen - soft, nontender, nondistended, no masses or organomegaly  Neurological - alert, oriented, normal speech, no focal findings or movement disorder noted  Musculoskeletal - no joint tenderness, deformity or swelling  Extremities - peripheral pulses normal, no pedal edema, no clubbing or cyanosis  Skin - normal coloration and turgor, no rashes, no suspicious skin lesions noted         Labs:     Results     Procedure Component Value Units Date/Time    Hemoglobin and hematocrit, blood [[id number]]   (Abnormal) Collected:  02/10/17 0806    Specimen:  Blood Updated:  02/10/17 0828     Hgb 9.4 (L) g/dL      Hematocrit 47.8 (L) %     Comprehensive metabolic panel [[id number]]  (Abnormal) Collected:  02/10/17 0152    Specimen:  Blood Updated:  02/10/17 0239     Glucose 85 mg/dL      BUN 65.7 (H) mg/dL      Creatinine 1.6 (H) mg/dL      Sodium 846 (L) mEq/L      Potassium 4.6 mEq/L      Chloride 109 mEq/L      CO2 16 (L) mEq/L      Calcium 8.1 mg/dL      Protein, Total 5.6 (L) g/dL      Albumin 3.2 (L) g/dL      AST (SGOT) 12 U/L      ALT 8 U/L      Alkaline Phosphatase 62 U/L      Bilirubin, Total 0.6 mg/dL      Globulin 2.4 g/dL      Albumin/Globulin Ratio 1.3    GFR [[id number]] Collected:  02/10/17 0152     Updated:  02/10/17 0239     EGFR 41.0    CBC and differential [[id number]]  (Abnormal) Collected:  02/10/17 0152    Specimen:  Blood from Blood Updated:  02/10/17 0222     WBC 7.91 x10 3/uL      Hgb 8.3 (L) g/dL      Hematocrit 25.3 (L) %      Platelets 284 x10 3/uL      RBC 3.16 (L) x10 6/uL      MCV 88.9 fL      MCH 26.3 (L) pg      MCHC 29.5 (L) g/dL  RDW 15 %      MPV 10.2 fL      Neutrophils 81.0 %      Lymphocytes Automated 5.2 %      Monocytes 12.4 %      Eosinophils Automated 0.6 %      Basophils Automated 0.4 %      Immature Granulocyte 0.4 %      Nucleated RBC 0.3 /100 WBC      Neutrophils Absolute 6.41 x10 3/uL      Abs Lymph Automated 0.41 (L) x10 3/uL      Abs Mono Automated 0.98 x10 3/uL      Abs Eos Automated 0.05 x10 3/uL      Absolute Baso Automated 0.03 x10 3/uL      Absolute Immature Granulocyte 0.03 x10 3/uL      Absolute NRBC 0.02 (H) x10 3/uL     MRSA culture [[id number]] Collected:  02/09/17 2319    Specimen:  Body Fluid from Nares and Throat Updated:  02/10/17 0152    Prepare RBC: one unit [[id number]] Collected:  02/09/17 1440     Updated:  02/10/17 0042     RBC Leukoreduced RBC Leukoreduced     BLUNIT [id number]     Status transfused     PRODUCT CODE (NON READABLE) E4533V00      Expiration Date [id number]     UTYPE O POS    Prepare RBC: one unit [[id number]] Collected:  02/09/17 1440     Updated:  02/10/17 0042     RBC Leukoreduced RBC Leukoreduced     BLUNIT [id number]     Status transfused     PRODUCT CODE (NON READABLE) E4532V00     Expiration Date [id number]     UTYPE O POS    Type and Screen [[id number]] Collected:  02/09/17 1440    Specimen:  Blood Updated:  02/09/17 1533     ABO Rh O POS     AB Screen Gel NEG    PT/APTT [[id number]]  (Abnormal) Collected:  02/09/17 1440     Updated:  02/09/17 1520     PT 14.9 sec      PT INR 1.2 (H)     PT Anticoag. Given Within 48 hrs. None     PTT 29 sec     Comprehensive metabolic panel [[id number]]  (Abnormal) Collected:  02/09/17 1440    Specimen:  Blood Updated:  02/09/17 1511     Glucose 102 (H) mg/dL      BUN 32.2 (H) mg/dL      Creatinine 2.1 (H) mg/dL      Sodium 025 (L) mEq/L      Potassium 4.7 mEq/L      Chloride 105 mEq/L      CO2 16 (L) mEq/L      Calcium 8.9 mg/dL      Protein, Total 6.7 g/dL      Albumin 3.6 g/dL      AST (SGOT) 12 U/L      ALT 10 U/L      Alkaline Phosphatase 71 U/L      Bilirubin, Total 0.3 mg/dL      Globulin 3.1 g/dL      Albumin/Globulin Ratio 1.2    GFR [[id number]] Collected:  02/09/17 1440     Updated:  02/09/17 1511     EGFR 30.0    CBC with differential [[id number]]  (Abnormal) Collected:  02/09/17 1440    Specimen:  Blood from Blood Updated:  02/09/17  1458     WBC 8.65 x10 3/uL      Hgb 7.1 (L) g/dL      Hematocrit 96.0 (L) %      Platelets 396 x10 3/uL      RBC 2.70 (L) x10 6/uL      MCV 91.5 fL      MCH 26.3 (L) pg      MCHC 28.7 (L) g/dL      RDW 15 %      MPV 10.7 fL      Neutrophils 80.3 %      Lymphocytes Automated 4.3 %      Monocytes 14.3 %      Eosinophils Automated 0.3 %      Basophils Automated 0.3 %      Immature Granulocyte 0.5 %      Nucleated RBC 0.2 /100 WBC      Neutrophils Absolute 6.94 x10 3/uL      Abs Lymph Automated 0.37 (L) x10 3/uL      Abs Mono Automated 1.24 (H) x10 3/uL      Abs  Eos Automated 0.03 x10 3/uL      Absolute Baso Automated 0.03 x10 3/uL      Absolute Immature Granulocyte 0.04 x10 3/uL      Absolute NRBC 0.02 (H) x10 3/uL               Rads:   Radiological Procedure reviewed.    Signed by: Schuyler Amor MD  [phone]

## 2017-02-10 NOTE — UM Notes (Signed)
02/09/17 1916  Admit to Inpatient (ADULT INPATIENT ADMIT PANEL ) Once         UTILIZATION REVIEW CONTACT: Name: Josie Montoya Watkin  Clinical Case Manager  - Utilization Review  [facility]  Address:  [address] [place], [place]  [address]  NPI:   [id number]  Tax ID:  [id number]  Phone: [phone]  Fax: [phone]    Please use fax number [phone] to provide authorization for hospital services or to request additional information.        PATIENT NAME: [name],[name]   DOB: [birth year]   PMH:  has a past medical history of Anemia; DOE (dyspnea on exertion); Glaucoma NEC; Hypertensive disorder; Skin cancer; Ulcerative colitis, chronic; and Vision abnormalities.  PSH:  has a past surgical history that includes Eye surgery and EGD, COLONOSCOPY (04/03/2012).     ADMISSION REVIEW   Patient is a 83 yo with prior history of gastric AVM who presents after his PCP found abnormal labs. Patient states that he has been feeling fatigued for the last 6 months. No change or increase in his symptoms. NO fevers chills abdo pain nausea vomiting sick contacts chest pain shortness of breath dizzyness lightheadedness. NO hemoptysis Hematemesis or melena. No dark tarry stool     VS: 97.1, 108, 16, 102/57, 99% RA    Abnormal Labs: H/H 7.1/24.7, GLUCOSE 102, BUN/CREAT 47.0/2.1, NA 133, CO2 16, PT 14.9, INR 1.2    Medications:   pantoprazole (PROTONIX) injection 40 mg 40 mg, IV, BID Given: 11/22 0951     Assessment:  General Appearance:  alert, well appearing, and in no distress and oriented to person, place, and time  Mental status:  alert, oriented to person, place, and time, normal mood, behavior, speech, dress, motor activity, and thought processes  Neuro:  alert, oriented, normal speech, no focal findings or movement disorder noted, screening mental status exam normal, neck supple without rigidity, cranial nerves II through XII intact  Neck:supple, no significant adenopathy  Lungs: clear to auscultation, no wheezes,  rales or rhonchi, symmetric air entry  Cardiac: normal rate, regular rhythm, normal S1, S2, no murmurs, rubs, clicks or gallops  Abdomen:  soft, nontender, nondistended, no masses or organomegaly  Extremities: peripheral pulses normal, no pedal edema, no clubbing or cyanosis     Plan:   Patient does not have active bleed at this time. Likely has chronic bleed/ anemia. Patient had bedside orthostatics done which was not positive.  - Appreciate GI input. Will likely get an EGD and colonscopy in the future as per GI. Monitor H&H  - transfuse prbc to keep Hb > 7  - GI nutritional support  -  GI /DVT prophylaxis    Per GI:   1. Monitor H/H and for overt GI bleeding. Transfuse as necessary.  2. Recommend Protonix 40 mg IV BID.  3. Clear liquid diet.   4. Recommend EGD and colonoscopy for further evaluation of anemia. Given timing needed for prep, anticipate procedures on 02/11/17. The risks, benefits, potential complications and alternatives to the procedure were discussed.  The patient agrees to the procedure.  5. Golytely prep to begin tomorrow afternoon, and the pt should be NPO after midnight on Thursday night 02/11/17 (except for bowel prep) for procedures Friday.    Randa Lynn, RN, BSN  UR Case Manager   [facility]  T [phone]

## 2017-02-10 NOTE — Progress Notes (Signed)
Patient transferred from [facility] without incident; pt to have clear liquids until midnight, then NPO for planned EGD/colonoscopy in am; bowel pre to begin at 1800; patient acclimated to room; TELE initiated per orders; no needs expressed at this time; continue POC.

## 2017-02-10 NOTE — Plan of Care (Addendum)
Problem: Safety  Goal: Patient will be free from injury during hospitalization  Outcome: Progressing   02/10/17 0245   Goal/Interventions addressed this shift   Patient will be free from injury during hospitalization  Assess patient's risk for falls and implement fall prevention plan of care per policy;Provide and maintain safe environment;Use appropriate transfer methods;Ensure appropriate safety devices are available at the bedside;Include patient/ family/ care giver in decisions related to safety;Hourly rounding       Problem: Pain  Goal: Pain at adequate level as identified by patient  Outcome: Progressing   02/10/17 0245   Goal/Interventions addressed this shift   Pain at adequate level as identified by patient Identify patient comfort function goal;Assess for risk of opioid induced respiratory depression, including snoring/sleep apnea. Alert healthcare team of risk factors identified.;Assess pain on admission, during daily assessment and/or before any "as needed" intervention(s);Evaluate patient's satisfaction with pain management progress;Reassess pain within 30-60 minutes of any procedure/intervention, per Pain Assessment, Intervention, Reassessment (AIR) Cycle;Evaluate if patient comfort function goal is met;Offer non-pharmacological pain management interventions;Include patient/patient care companion in decisions related to pain management as needed       Problem: Discharge Barriers  Goal: Patient will be discharged home or other facility with appropriate resources  Outcome: Progressing   02/10/17 0245   Goal/Interventions addressed this shift   Discharge to home or other facility with appropriate resources Provide appropriate patient education;Provide information on available health resources;Initiate discharge planning       Problem: Moderate/High Fall Risk Score >5  Goal: Patient will remain free of falls  Outcome: Progressing   02/10/17 0245   OTHER   Moderate Risk (6-13) LOW-Fall Interventions  Appropriate for Low Fall Risk;LOW-Anticoagulation education for injury risk       Comments:   Care Plan Notes          [facility] Nursing Progress Note  Admitted pt from ED around 2300--- hgb 7.1 ---1 unit PRBC  received in ED, 2nd unir PRBC on going when pt arrived to the unit; no bleeding noted, asymptomatic, VS stable        [name] is a 83 y.o. male  Admitted 02/09/2017  2:30 PM (Hospital day 1)    Indication for continued [facility] status:  GIB-- s/p 2 units PRBC (post transfusion hgb 8.3 at 0200)  GI cx, poss EGD/ colonoscopy, serial H&H (?)  Hemodynamically stable at this time.    Mews Score: 2  Actions taken for MEWS if >4: na        Major Shift Events:  0710: Pt expressed having SOB, 92% on RA, RR31-33/min, BP 110's/50's--> crackles noted on rt mid + bil bases (s/p 2 units PRBC  Given last night)---> lasix 20 mg IV x1 given as ordered + 2L NC.    Review of Systems  Neuro:  A&O x4, OOB with min assist  Afebrile, denies any complaints    Cardiac:  S1S2, SR  BP WNL  Trace edema on bil LE--SCDs on bil LE on  NS 100  ml/hr    Respiratory:  RA, CTA    GI/GU:  +BS  Full liquid diet -- refused intake at this time  Voids    BM this shift? no    Skin Assessment  Intact, asymptomatic      LDAs  Patient Lines/Drains/Airways Status    Active Lines, Drains and Airways     Name:   Placement date:   Placement time:   Site:   Days:  Peripheral IV 02/09/17 Right Antecubital  02/09/17    1736    Antecubital    less than 1    Peripheral IV 02/09/17 Left Hand  02/09/17    1805    Hand    less than 1                  Psycho/Social:  wnl    Interpreter Services:  Does the patient require an Interpreter? no    If so, what type of interpreter was used? na    If the family was utilized, is the interpreter waiver signed and located in the chart? na

## 2017-02-11 ENCOUNTER — Encounter
Admission: EM | Disposition: A | Discharge status: Home / Self Care | Payer: Self-pay | Source: Home / Self Care | Attending: Specialist

## 2017-02-11 ENCOUNTER — Inpatient Hospital Stay: Payer: Medicare Other | Admitting: Anesthesiology

## 2017-02-11 ENCOUNTER — Inpatient Hospital Stay: Payer: Medicare Other

## 2017-02-11 DIAGNOSIS — I959 Hypotension, unspecified: Secondary | ICD-10-CM

## 2017-02-11 HISTORY — PX: EGD, COLONOSCOPY: SHX3799

## 2017-02-11 LAB — CBC AND DIFFERENTIAL
Absolute NRBC: 0.06 10*3/uL — ABNORMAL HIGH
Basophils Absolute Automated: 0.04 10*3/uL (ref 0.00–0.20)
Basophils Automated: 0.4 %
Eosinophils Absolute Automated: 0.08 10*3/uL (ref 0.00–0.70)
Eosinophils Automated: 0.8 %
Hematocrit: 31.8 % — ABNORMAL LOW (ref 42.0–52.0)
Hgb: 9.4 g/dL — ABNORMAL LOW (ref 13.0–17.0)
Immature Granulocytes Absolute: 0.05 10*3/uL
Immature Granulocytes: 0.5 %
Lymphocytes Absolute Automated: 0.38 10*3/uL — ABNORMAL LOW (ref 0.50–4.40)
Lymphocytes Automated: 3.8 %
MCH: 26.1 pg — ABNORMAL LOW (ref 28.0–32.0)
MCHC: 29.6 g/dL — ABNORMAL LOW (ref 32.0–36.0)
MCV: 88.3 fL (ref 80.0–100.0)
MPV: 10.6 fL (ref 9.4–12.3)
Monocytes Absolute Automated: 1.24 10*3/uL — ABNORMAL HIGH (ref 0.00–1.20)
Monocytes: 12.4 %
Neutrophils Absolute: 8.23 10*3/uL — ABNORMAL HIGH (ref 1.80–8.10)
Neutrophils: 82.1 %
Nucleated RBC: 0.6 /100 WBC (ref 0.0–1.0)
Platelets: 333 10*3/uL (ref 140–400)
RBC: 3.6 10*6/uL — ABNORMAL LOW (ref 4.70–6.00)
RDW: 15 % (ref 12–15)
WBC: 10.02 10*3/uL (ref 3.50–10.80)

## 2017-02-11 LAB — LIPID PANEL
Cholesterol / HDL Ratio: 3.1
Cholesterol: 111 mg/dL (ref 0–199)
HDL: 36 mg/dL — ABNORMAL LOW (ref 40–9999)
LDL Calculated: 59 mg/dL (ref 0–99)
Triglycerides: 79 mg/dL (ref 34–149)
VLDL Calculated: 16 mg/dL (ref 10–40)

## 2017-02-11 LAB — HEMOGLOBIN AND HEMATOCRIT, BLOOD
Hematocrit: 30.2 % — ABNORMAL LOW (ref 42.0–52.0)
Hematocrit: 27.9 % — ABNORMAL LOW (ref 42.0–52.0)
Hgb: 8.9 g/dL — ABNORMAL LOW (ref 13.0–17.0)
Hgb: 8.4 g/dL — ABNORMAL LOW (ref 13.0–17.0)

## 2017-02-11 LAB — ECG 12-LEAD
Atrial Rate: 83 {beats}/min
P Axis: 54 degrees
P-R Interval: 162 ms
Q-T Interval: 416 ms
QRS Duration: 94 ms
QTC Calculation (Bezet): 488 ms
R Axis: -23 degrees
T Axis: 61 degrees
Ventricular Rate: 83 {beats}/min

## 2017-02-11 LAB — COMPREHENSIVE METABOLIC PANEL
ALT: 10 U/L (ref 0–55)
AST (SGOT): 11 U/L (ref 5–34)
Albumin/Globulin Ratio: 1.2 (ref 0.9–2.2)
Albumin: 3.5 g/dL (ref 3.5–5.0)
Alkaline Phosphatase: 73 U/L (ref 38–106)
BUN: 38 mg/dL — ABNORMAL HIGH (ref 9.0–28.0)
Bilirubin, Total: 0.9 mg/dL (ref 0.2–1.2)
CO2: 20 mEq/L — ABNORMAL LOW (ref 22–29)
Calcium: 8.6 mg/dL (ref 7.9–10.2)
Chloride: 105 mEq/L (ref 100–111)
Creatinine: 1.8 mg/dL — ABNORMAL HIGH (ref 0.7–1.3)
Globulin: 2.9 g/dL (ref 2.0–3.6)
Glucose: 107 mg/dL — ABNORMAL HIGH (ref 70–100)
Potassium: 5 mEq/L (ref 3.5–5.1)
Protein, Total: 6.4 g/dL (ref 6.0–8.3)
Sodium: 136 mEq/L (ref 136–145)

## 2017-02-11 LAB — GFR: EGFR: 35.8

## 2017-02-11 LAB — HEMOLYSIS INDEX: Hemolysis Index: 19 — ABNORMAL HIGH (ref 0–18)

## 2017-02-11 LAB — MAGNESIUM: Magnesium: 2.2 mg/dL (ref 1.6–2.6)

## 2017-02-11 SURGERY — EGD, COLONOSCOPY
Anesthesia: Anesthesia General | Site: Mouth

## 2017-02-11 MED ORDER — PHENYLEPHRINE HCL 10 MG/ML IV SOLN (WRAP)
Status: DC | PRN
Start: 2017-02-11 — End: 2017-02-11
  Administered 2017-02-11: 50 ug via INTRAVENOUS
  Administered 2017-02-11 (×2): 100 ug via INTRAVENOUS
  Administered 2017-02-11: 150 ug via INTRAVENOUS
  Administered 2017-02-11 (×2): 50 ug via INTRAVENOUS

## 2017-02-11 MED ORDER — PHENYLEPHRINE 100 MCG/ML IN NACL 0.9% IV SOSY
PREFILLED_SYRINGE | INTRAVENOUS | Status: AC
Start: 2017-02-11 — End: ?
  Filled 2017-02-11: qty 5

## 2017-02-11 MED ORDER — PERFLUTREN PROTEIN A MICROSPH IV SUSP
3.0000 mL | Freq: Once | INTRAVENOUS | Status: AC
Start: 2017-02-11 — End: 2017-02-11
  Administered 2017-02-11: 16:00:00 3 mL via INTRAVENOUS
  Filled 2017-02-11: qty 3

## 2017-02-11 MED ORDER — EPHEDRINE SULFATE 50 MG/ML IJ/IV SOLN (WRAP)
Status: DC | PRN
Start: 2017-02-11 — End: 2017-02-11
  Administered 2017-02-11 (×2): 10 mg via INTRAVENOUS
  Administered 2017-02-11: 5 mg via INTRAVENOUS
  Administered 2017-02-11 (×2): 10 mg via INTRAVENOUS

## 2017-02-11 MED ORDER — PROPOFOL INFUSION 10 MG/ML
INTRAVENOUS | Status: DC | PRN
Start: 2017-02-11 — End: 2017-02-11
  Administered 2017-02-11: 140 ug/kg/min via INTRAVENOUS

## 2017-02-11 MED ORDER — PROPOFOL INFUSION 10 MG/ML
INTRAVENOUS | Status: DC | PRN
Start: 2017-02-11 — End: 2017-02-11
  Administered 2017-02-11: 140 mg via INTRAVENOUS
  Administered 2017-02-11: 40 mg via INTRAVENOUS

## 2017-02-11 MED ORDER — PROPOFOL 10 MG/ML IV EMUL (WRAP)
INTRAVENOUS | Status: AC
Start: 2017-02-11 — End: ?
  Filled 2017-02-11: qty 50

## 2017-02-11 SURGICAL SUPPLY — 48 items
BASIN EMESIS ROSE (Patient Supply) ×2 IMPLANT
BLOCK BITE MAXI 60FR LF STRD STRAP SDPRT (Procedure Accessories) ×1
BLOCK BITE OD60 FR STURDY STRAP SIDEPORT (Procedure Accessories) ×1
BLOCK BITE OD60 FR STURDY STRAP SIDEPORT DENTAL RETENTION RIM MAXI (Procedure Accessories) ×1 IMPLANT
CANISTER SCT 1500CC SAFELINER LF NS SMRG (Suction) ×1
CANISTER SUCTION 1500 CC SEMIRIGID 1 (Suction) ×1 IMPLANT
CANISTER SUCTION 1500 CC SEMIRIGID 1 ELBOW FILTER SELF ALIGN LID (Suction) ×1 IMPLANT
CLIP HEMOSTATIC L235 CM BRAID CATHETER (Clamp) ×1 IMPLANT
CLIP HEMOSTATIC L235 CM BRAID CATHETER ROTATION CONTROL KNOB 360 (Clamp) IMPLANT
CLIP HMST RSL 360 235CM LF STRL BRD CATH (Clamp) ×1 IMPLANT
CONTAINER HISTOLOGY 60 ML 30 ML GRADUATE LEAK RESISTANT O RING PREFILL (Procedure Accessories) IMPLANT
CONTAINER SPEC FN MESH 1CH PLYP (Other) ×1
DEVICE ENDOSCOPIC RAPID EXCHANGE BIOPSY (Procedure Accessories) ×1
DEVICE ENDOSCOPIC RAPID EXCHANGE BIOPSY CAP OLYMPUS (Procedure Accessories) ×1 IMPLANT
DEVICE ESCP STRL RX BX CAP DISP OLMPS (Procedure Accessories) ×1
FORCEPS BIOPSY L240 CM JUMBO NEEDLE (Procedure Accessories) IMPLANT
FORCEPS BIOPSY L240 CM JUMBO NEEDLE RADIAL JAW (Procedure Accessories) IMPLANT
FORCEPS BIOPSY L240 CM LARGE CAPACITY (Procedure Accessories)
FORCEPS BIOPSY L240 CM LARGE CAPACITY MICROMESH TEETH STREAMLINE (Procedure Accessories) IMPLANT
FORCEPS BX JMB RJ 4 240CM NDL (Procedure Accessories)
FORCEPS BX SS LG CPC RJ 4 2.4MM 240CM (Procedure Accessories)
GLOVE EXAM LARGE NITRILE CHEMOTHERAPY POWDER FREE SENSE OATMEAL (Glove) ×1 IMPLANT
GLOVE EXAM LARGE NITRILE POWDER FREE SENSE OATMEAL (Glove) ×1 IMPLANT
GLOVE EXAM NITRILE RESTORE LG (Glove) ×1
GLV EXAM NITRILE RESTORE LG (Glove) ×2
GOWN ISL PP PE REG LG LF FULL BCK NK TIE (Gown) ×2
GOWN ISOLATION REGULAR LARGE FULL BACK NECK TIE ELASTIC CUFF (Gown) ×1 IMPLANT
JELLY LUB EZ LF STRL H2O SOL NGRS TRNLU (Irrigation Solutions) ×2 IMPLANT
PAD ABC GROUNDING BLUE (Laser Supplies) IMPLANT
PROBE ELECTROSURGICAL L220 CM FLEXIBLE (Procedure Accessories) ×1
PROBE ELECTROSURGICAL L220 CM FLEXIBLE STRAIGHT FIRE OD2.3 MM FIAPC (Procedure Accessories) IMPLANT
PROBE ESURG FIAPC 2.3MM 220CM STRL FLXB (Procedure Accessories) ×1
SNARE ESCP MIC CPTVTR 13MM 240IN STRL (GE Lab Supplies)
SNARE SMALL HEXAGON CAPTIVATOR STIFF ENDOSCOPIC POLYPECTOMY (GE Lab Supplies) IMPLANT
SOL FORMALIN 10% PREFILL 30ML (Procedure Accessories)
SPONGE GAUZE L4 IN X W4 IN 4 PLY HIGH (Sponge) ×1
SPONGE GAUZE L4 IN X W4 IN 4 PLY NONWOVEN LINT FREE CURITY RAYON (Sponge) ×1 IMPLANT
SPONGE GZE RYN PLSTR CRTY 4X4IN LF NS 4 (Sponge) ×1
SYRINGE 50 ML GRADUATE NONPYROGENIC DEHP (Syringes, Needles) ×1
SYRINGE 50 ML GRADUATE NONPYROGENIC DEHP FREE PVC FREE BD MEDICAL (Syringes, Needles) ×1 IMPLANT
SYRINGE MED 50ML LF STRL GRAD N-PYRG (Syringes, Needles) ×1
TRAP 1 CHAMBER ENDO SUCTION QUICK CATCH (Endoscopic Supplies)
TRAP 1 CHAMBER ENDO SUCTION QUICK CATCH WIDE-EYE SPECIMEN POLYP (Endoscopic Supplies) IMPLANT
TRAP SPEC W-EYE TRNS LID 1 CHMBR INLN (Endoscopic Supplies)
TRAP SUCTION SPECIMEN POLYP STONE (Other) ×1 IMPLANT
TUBING ENDOSCOPY EXTENSION (Endoscopic Supplies) ×2 IMPLANT
WATER STERILE PVC FREE DEHP FREE 1000 ML (Solution) ×1 IMPLANT
WATER STRL 1000ML PIC LF PVC FR DEHP-FR (Solution) ×1

## 2017-02-11 NOTE — H&P (Signed)
GI PRE PROCEDURE NOTE    Proceduralist Comments:   Review of Systems and Past Medical / Surgical History performed: Yes     Indications:Gastrointestinal bleeding and Gastrointestinal bleeding    Previous Adverse Reaction to Anesthesia or Sedation (if yes, describe): No    Physical Exam / Laboratory Data (If applicable)   General: Alert and cooperative  Lungs: Lungs clear to auscultation  Cardiac: RRR, normal S1S2.    Abdomen: Soft, non tender. Normal active bowel sounds  Other:       Recent BMP   Recent Labs      02/11/17   0135   Glucose  107*   BUN  38.0*   Creatinine  1.8*   Calcium  8.6   Sodium  136   Potassium  5.0   Chloride  105   CO2  20*       American Society of Anesthesiologists (ASA) Physical Status Classification:   Anesthesia ASA Score:           Planned Sedation:   Deep sedation with anesthesia    Attestation:   [name] has been reassessed immediately prior to the procedure and is an appropriate candidate for the planned sedation and procedure. Risks, benefits and alternatives to the planned procedure and sedation have been explained to the patient or guardian:  yes        Signed by: Lestine Mount

## 2017-02-11 NOTE — Plan of Care (Signed)
Problem: Safety  Goal: Patient will be free from injury during hospitalization  Outcome: Progressing   02/11/17 2050   Goal/Interventions addressed this shift   Patient will be free from injury during hospitalization  Assess patient's risk for falls and implement fall prevention plan of care per policy;Provide and maintain safe environment;Use appropriate transfer methods;Ensure appropriate safety devices are available at the bedside;Include patient/ family/ care giver in decisions related to safety;Hourly rounding     Goal: Patient will be free from infection during hospitalization  Outcome: Progressing   02/11/17 2050   Goal/Interventions addressed this shift   Free from Infection during hospitalization Assess and monitor for signs and symptoms of infection;Monitor lab/diagnostic results       Problem: Pain  Goal: Pain at adequate level as identified by patient  Outcome: Progressing   02/11/17 2050   Goal/Interventions addressed this shift   Pain at adequate level as identified by patient Identify patient comfort function goal;Assess for risk of opioid induced respiratory depression, including snoring/sleep apnea. Alert healthcare team of risk factors identified.;Assess pain on admission, during daily assessment and/or before any "as needed" intervention(s);Reassess pain within 30-60 minutes of any procedure/intervention, per Pain Assessment, Intervention, Reassessment (AIR) Cycle;Evaluate if patient comfort function goal is met;Evaluate patient's satisfaction with pain management progress       Problem: Side Effects from Pain Analgesia  Goal: Patient will experience minimal side effects of analgesic therapy  Outcome: Progressing   02/11/17 2050   Goal/Interventions addressed this shift   Patient will experience minimal side effects of analgesic therapy Monitor/assess patient's respiratory status (RR depth, effort, breath sounds);Assess for changes in cognitive function;Prevent/manage side effects per LIP orders  (i.e. nausea, vomiting, pruritus, constipation, urinary retention, etc.);Evaluate for opioid-induced sedation with appropriate assessment tool (i.e. POSS)       Problem: Discharge Barriers  Goal: Patient will be discharged home or other facility with appropriate resources  Outcome: Progressing   02/11/17 2050   Goal/Interventions addressed this shift   Discharge to home or other facility with appropriate resources Provide appropriate patient education;Provide information on available health resources;Initiate discharge planning       Problem: Psychosocial and Spiritual Needs  Goal: Demonstrates ability to cope with hospitalization/illness  Outcome: Progressing   02/11/17 2050   Goal/Interventions addressed this shift   Demonstrates ability to cope with hospitalizations/illness Encourage verbalization of feelings/concerns/expectations;Provide quiet environment;Assist patient to identify own strengths and abilities;Encourage patient to set small goals for self;Encourage participation in diversional activity       Problem: Moderate/High Fall Risk Score >5  Goal: Patient will remain free of falls  Outcome: Progressing   02/11/17 1932   OTHER   Moderate Risk (6-13) MOD-Place Fall Risk level on whiteboard in room       Problem: Altered GI Function  Goal: Fluid and electrolyte balance are achieved/maintained  Outcome: Progressing   02/11/17 2050   Goal/Interventions addressed this shift   Fluid and electrolyte balance are achieved/maintained Monitor intake and output every shift;Monitor/assess lab values and report abnormal values;Provide adequate hydration;Assess for confusion/personality changes     Goal: Elimination patterns are normal or improving  Outcome: Progressing   02/11/17 2050   Goal/Interventions addressed this shift   Elimination patterns are normal or improving Report abnormal assessment to physician;Anticipate/assist with toileting needs;Monitor for abdominal discomfort;Monitor for abdominal  distension;Assess for signs and symptoms of bleeding. Report signs of bleeding to physician;Assess for normal bowel sounds     Goal: No bleeding  Outcome: Progressing  02/11/17 2050   Goal/Interventions addressed this shift   No bleeding  Monitor and assess vitals and hemodynamic parameters;Monitor/assess lab values and report abnormal values;Assess for bruising/petechia       Comments: Pt is a moderate falls risk. Nurse educated pt on the use of the call bell to obtain staff assistance for all transfers and ambulation. Pt demonstrated understanding on use of call bell and verbally contracted with the bedside nurse to call for assistance before attempting to get out of bed. Pt room is clean, clutter free and adequately lit. Pt call bell, personal belongings and bedside table are within reach. Pt has non slip socks on. Intentional hourly rounding to continue per protocol.

## 2017-02-11 NOTE — Progress Notes (Addendum)
Medicine Shift Note    Patient Lines/Drains/Airways Status    Active Lines, Drains and Airways     Name:   Placement date:   Placement time:   Site:   Days:    Peripheral IV 02/09/17 Left Hand  02/09/17    1805    Hand    1                Last BM: 11/23    Pending Orders: EGD/colonoscopy  and Echogram    Discharge Plan: TBD    POC: pt is aware    Skin: CDI skin assessment performed with Tracy-RN    Tele: yes -anemia     Activity:  ambulatory with a walker    Interpreter Needs: no, Used: no, Method:n/a, Waiver Need/present:  English speaker    Shift Note: NPO , on Golytely. H/H  To be checked Q8 hours  Result: 9.4/ 31.8. Next is due at 0800.

## 2017-02-11 NOTE — Anesthesia Postprocedure Evaluation (Signed)
Anesthesia Post Evaluation    Patient: [name]    Procedure(s):  EGD, COLONOSCOPY    Anesthesia type: General TIVA    Last Vitals:   Vitals:    02/11/17 1329   BP: 98/55   Pulse: 82   Resp: 18   Temp:    SpO2: 95%       Patient Location: Phase II PACU      Post Pain: Patient not complaining of pain, continue current therapy    Mental Status: awake    Respiratory Function: tolerating room air    Cardiovascular: stable    Nausea/Vomiting: patient not complaining of nausea or vomiting    Hydration Status: adequate    Post Assessment: no apparent anesthetic complications, no reportable events and no evidence of recall          Anesthesia Qualified Clinical Data Registry 2018    PACU Reintubation  Did the Patient have general anesthesia with intubation: No        PONV Adult  Is the patient aged 60 or older: Yes  Did the patient receive recieve a general anesthestic: No          PONV Pediatric  Is the patient aged 12-17? No            PACU Transfer Checklist Protocol  Was the patient transferred to the PACU at the conclusion of surgery? Yes  Was a checklist or transfer protocol used? Yes    ICU Transfer Checklist Protocol  Was the patient transferred to the ICU at the conclusion of surgery? No      Post-op Pain Assessment Prior to Anesthesia Care End  Age >=18 and assessed for pain in PACU: Yes  Pacu pain score <7/10: Yes      Perioperative Mortality  Perioperative mortality prior to Anesthesia end time: No    Perioperative Cardiac Arrest  Did the patient have an unanticipated intraoperative cardiac arrest between anesthesia start time and anesthesia end time? No    Unplanned Admission to ICU  Did the patient have an unplanned admission to the ICU (not initially anticipated at anesthesia start time)? No      Signed by: Oneta Rack, 02/11/2017 1:36 PM

## 2017-02-11 NOTE — Progress Notes (Signed)
GE Lab RR.  VSS. Alert, tolerated apple juice. Daughter at bedside. Per Dr Cyndra Numbers, Clear Liquid diet. Written report given to daughter.  Report given to floor nurse, Leotis Shames

## 2017-02-11 NOTE — Progress Notes (Addendum)
02/11/17 2300   Provider Notification   Reason for Communication Change in status   Provider Name [facility]   Provider Role Attending physician   Method of Communication Page   Readback Results No   Response Waiting for response   Notification Time 2312   Shift Event Other  (Abnormal ECG)   PT had multiple SVT HR 130 -144. VS 103/55,86 97.8, 17, 98%RA. Asymptomatic. EKG interpretation Sinus rhythm with PSVC, nonspecific T wave abnormality, prolonged QT, Abnormal ECG.    Dr. York Grice called back promptly , T/O received for MG and potassium STAT.Clin tech is aware.

## 2017-02-11 NOTE — Plan of Care (Signed)
Problem: Safety  Goal: Patient will be free from injury during hospitalization  Outcome: Progressing   02/11/17 0903   Goal/Interventions addressed this shift   Patient will be free from injury during hospitalization  Assess patient's risk for falls and implement fall prevention plan of care per policy;Provide and maintain safe environment;Use appropriate transfer methods;Ensure appropriate safety devices are available at the bedside;Include patient/ family/ care giver in decisions related to safety;Hourly rounding       Problem: Altered GI Function  Goal: Elimination patterns are normal or improving  Outcome: Progressing   02/11/17 0903   Goal/Interventions addressed this shift   Elimination patterns are normal or improving Report abnormal assessment to physician;Assess for normal bowel sounds;Monitor for abdominal distension;Monitor for abdominal discomfort;Assess for signs and symptoms of bleeding. Report signs of bleeding to physician;Assess for flatus;Encourage /perform oral hygiene as appropriate     Goal: No bleeding  Outcome: Progressing   02/11/17 0903   Goal/Interventions addressed this shift   No bleeding  Monitor and assess vitals and hemodynamic parameters;Monitor/assess lab values and report abnormal values

## 2017-02-11 NOTE — UM Notes (Signed)
11/23      97.5, 94, 24-28, 89/51      Meds: Protonix 40 mg iv 2x/day, IVF @ 100cc/hr    Assessment:   Symptomatic anemia   Tachycardia   Hypotension   Chronic ulcerative colitis   AKI multifactorial         Plan:   Colonoscopy  today   H/h stable monitor BID   Follow up post procedure recommendations   AC on hold   SCD for DVT prophylaxis

## 2017-02-11 NOTE — Anesthesia Preprocedure Evaluation (Signed)
Anesthesia Evaluation    AIRWAY      Neck ROM: full  Mouth Opening:full   CARDIOVASCULAR    cardiovascular exam normal       DENTAL         PULMONARY    pulmonary exam normal     OTHER FINDINGS              Relevant Problems   No relevant active problems               Anesthesia Plan    ASA 3     general                                 informed consent obtained                   Signed by: Bonita Quin Yoselyn Mcglade 02/11/17 12:09 PM

## 2017-02-11 NOTE — OT Eval Note (Signed)
[facility]   Occupational Therapy Evaluation/Discharge    Patient: [name]    MRN#: [id number]   Unit: [id number]  Bed: FXENDO/FXENDO                                     Discharge Recommendations:   Discharge Recommendation: Home with supervision   DME Recommended for Discharge:  (none)      Assessment:   [name] is a 83 y.o. male admitted 02/09/2017.   Brief chart review completed including review of labs, review of imaging, review of vitals, review of H&P and physician progress notes and review of consulting physician notes . There are no comorbidities or other factors that affect plan of care and require modification of task. Pt pleasant and cooperative throughout session. Pt performed bed mobility, scooting, EOB sitting and LB ADLs in taylor sit (I). Provided RW, pt performed sit <> stand transfer and functional mobility over household distance with (DS) and no LOB. Pt educated on POC, therapeutic process and all questions answered. Pt at functional baseline and likely to progress with medical course alone. Pt with no further acute OT needs at this time. OT signing out. D/C OT.       Therapy Diagnosis: Baseline    Rehabilitation Potential: N/A    Treatment Activities: OT Evaluation  Educated the patient to role of occupational therapy, plan of care, goals of therapy and safety with mobility and ADLs.    Plan:   D/C acute OT services     Treatment/Interventions: N/A    Risks/benefits/POC discussed: Yes       Precautions and Contraindications:   Falls,     Consult received for [name] for OT Evaluation and Treatment.  Patient's medical condition is appropriate for Occupational Therapy intervention at this time.      History of Present Illness:    [name] is a 83 y.o. male admitted on 02/09/2017 with dizziness.  He was found to have dark heme positive stool and possible GI bleeding. He was found to have ventricular tachycardia and atrial fibrillation and we are  consulted to address those    Admitting Diagnosis: Hypotension, unspecified hypotension type [I95.9]  Gastrointestinal hemorrhage, unspecified gastrointestinal hemorrhage type [K92.2]  GI bleed [K92.2]    Past Medical/Surgical History  Past Medical History:   Diagnosis Date   . Anemia     iron low   . DOE (dyspnea on exertion)     mild   . Glaucoma NEC    . Hypertensive disorder    . Skin cancer     2006 removed mole   . Ulcerative colitis, chronic    . Vision abnormalities      Past Surgical History:   Procedure Laterality Date   . EGD, COLONOSCOPY  04/03/2012    Procedure: EGD, COLONOSCOPY;  Surgeon: Lestine Mount, MD;  Location: [facility];  Service: Gastroenterology;  Laterality: N/A;   . EYE SURGERY      cataract       Imaging/Tests/Labs:   Xr Chest 2 Views    Result Date: 02/10/2017    Mild CHF basilar atelectasis and effusions.Geanie Cooley, MD 02/10/2017 2:42 PM      Social History:   Pt (I) PTA, utilizing RW for mobility PRN    Subjective:     Patient is agreeable to participation in the therapy session.  Nursing clears patient for therapy.     Patient Goal: "go home"  Pain:   Scale: 0/10  Location: NA  Intervention: RN aware    Objective:   Patient is in bed with telemetry in place.    Cognitive Status and Neuro Exam:   Pt alert and oriented x4, follows 100% of motor commands, no cognitive deficits noted     Musculoskeletal Examination  BUE ROM: Grossly WFL  BUE Strength: Grossly WFL      Sensory/Oculomotor Examination  Auditory: WFL  Tactile: WFL  Vision: WFL      Activities of Daily Living  Eating: Independent  Grooming: Independent  Bathing: Independent  UE Dressing: Independent  LE Dressing: Independent  Toileting: Independent    Functional Mobility:  Supine to Sit: Independent  Sit to Stand: Independent  Transfers: Independent     Balance  Static Sitting: mod I   Dynamic Sitting: mod I   Static Standing:  mod I  Dynamic Standing:  mod I    Participation and Activity Tolerance  Participation  Effort: good  Endurance:  good    Patient left with call bell within reach, all needs met, SCDs , fall mat , bed alarm in place and all questions answered. RN notified of session outcome and patient response.     Goals:                                  Salli Quarry OTR/L  Pager [phone]      Time of treatment:   OT Received On: 02/11/17  Start Time: 0800  Stop Time: 0825  Time Calculation (min): 25 min

## 2017-02-11 NOTE — Progress Notes (Signed)
[facility]   Okie Jansson M. Lucianne Muss, MD, [facility]  PROGRESS NOTE  Office: [phone]  Cell phone [phone]                                       Date Time: 02/11/17 8:31 AM  Patient Name: [name],[name]      Assessment:    Ventricular tachycardia, non-sustained, recorded on tele on 02/10/17 at 12:07- asymptomatic and hemodynamically stable.    Atrial fibrillation, new onset   CHF as evidenced by a BNP of 3000   GI bleeding   Anemia   CKD stage III  Plan:    Lipid profile   F/u on Echo   Check serum Mg   Hold off on anticoagulation for AF as he may be having active GI bleeding.   GI w/u in progress   Metoprolol 25 mg bid  Recent Cardiac Work up:   EKG 02/09/17:   AF with RVR at 130 bpm  NS STT abnormalities  BNP 02/10/17:  2921  Subjective:   Feeling ok. Denies chest pain or shortness of breath or palpitation.   Medications:     Current Facility-Administered Medications   Medication Dose Route Frequency   . metoprolol tartrate  25 mg Oral Q12H SCH   . pantoprazole  40 mg Intravenous BID         Physical Exam:     Vitals:    02/11/17 0659   BP: 97/68   Pulse: 73   Resp: 16   Temp: 98.4 F (36.9 C)   SpO2: 95%       Intake and Output Summary (Last 24 hours) at Date Time    Intake/Output Summary (Last 24 hours) at 02/11/17 0831  Last data filed at 02/11/17 8657   Gross per 24 hour   Intake             2520 ml   Output             3125 ml   Net             -605 ml       General appearance - in no distress   Head: NC/AT, mm moist  Neck - supple, Carotids 2+, no bruit, no JVD   Chest - clear to auscultation, no wheezes, rales or rhonchi, symmetric air entry   Heart - normal rate and regular rhythm, S1 and S2 normal, no murmurs   Abdomen - soft, nontender, nondistended, no masses or organomegaly   Extremities - peripheral pulses normal, no pedal edema, no clubbing or cyanosis        Labs:   Recent CBC Recent Labs      02/11/17   0135   RBC  3.60*   Hgb  9.4*   Hematocrit  31.8*   MCV  88.3   MCH   26.1*   MCHC  29.6*   RDW  15   MPV  10.6   Platelets  333     Recent BMP Recent Labs      02/11/17   0135   Glucose  107*   BUN  38.0*   Creatinine  1.8*   Calcium  8.6   Sodium  136   Potassium  5.0   Chloride  105   CO2  20*     Recent CMP   Recent Labs  02/11/17   0135   Glucose  107*   BUN  38.0*   Creatinine  1.8*   Sodium  136   CO2  20*   Calcium  8.6   Albumin  3.5   AST (SGOT)  11   ALT  10   Globulin  2.9     Recent CARDIAC ENZYMES No results for input(s): TROPONIN, ISTATTROPONI, CK, CKMB in the last 24 hours.    Invalid input(s): TROPONINT  Recent TSH No results for input(s): TSH in the last 24 hours.  Recent URIC ACID Invalid input(s): URIC  Recent BLOOD CULTURE No results for input(s): CXBLD in the last 24 hours.  Recent URINE CULTURE Invalid input(s): CXURN  Recent OCCULT BLOOD Invalid input(s): OCCBL  Recent PT/PTT No results for input(s): INR, PTT in the last 24 hours.    Invalid input(s): PTI, COUM, COUMP, ACOAG, ACOAP  Recent PT/INR No results for input(s): INR in the last 24 hours.    Invalid input(s): PTI, COUM, COUMP  Recent LIPID PANEL No results for input(s): CHOL, TRIG, HDL in the last 24 hours.    Invalid input(s): LDLC, VLDLC, LRAT  Recent ABG No results for input(s): TEMP, FIO2, RATE, MODE, ETCO2, PEEP in the last 24 hours.    Invalid input(s): APH, APCO2, APO2, AHCO3, ATCO2, ABE, AOSAT, ABGS, ALLEN, STATS, O2DEL, O2FLO, PRESS, VNTMN, PRSUP, TIVOL      Rads:   Radiological Procedure reviewed.    Signed by: Valerie Roys, MD, [facility]

## 2017-02-11 NOTE — Progress Notes (Signed)
Medicine Shift Note    Patient Lines/Drains/Airways Status    Active Lines, Drains and Airways     Name:   Placement date:   Placement time:   Site:   Days:    Peripheral IV 02/09/17 Left Hand  02/09/17    1805    Hand    1                Last BM: 02-09-17    Pending Orders: ECHO results pending    Discharge Plan: Home perhaps 02-12-17    POC: Pt and family aware    Skin: CDI    Tele: yes, NSR with PAC/PVCs and questionable new onset a-fib per cardiology    Activity: Up with CG assist    Interpreter Needs: no, Used: n/a, Method: n/a, Waiver Need/present: n/a    Shift Note:    Pt up with CG assist; voids in BR adequate amounts; BS active, passing flatus, last BM 02-09-17' no c/o pain; tolerating clear liquid diet, will advance diet tomorrow; EGD/Coloscopy today with findings of hemorrhoids and multiple AVMs in stomach and duodenum, all cauterized; continue with Q8 hour H&H; VSS; A&O X4; anticipate [place] to home tomorrow; continue POC.

## 2017-02-11 NOTE — Plan of Care (Signed)
Problem: Safety  Goal: Patient will be free from injury during hospitalization  Outcome: Progressing   02/11/17 0054   Goal/Interventions addressed this shift   Patient will be free from injury during hospitalization  Assess patient's risk for falls and implement fall prevention plan of care per policy;Provide and maintain safe environment;Ensure appropriate safety devices are available at the bedside;Use appropriate transfer methods;Include patient/ family/ care giver in decisions related to safety;Hourly rounding;Assess for patients risk for elopement and implement Elopement Risk Plan per policy     Goal: Patient will be free from infection during hospitalization  Outcome: Progressing   02/11/17 0054   Goal/Interventions addressed this shift   Free from Infection during hospitalization Assess and monitor for signs and symptoms of infection;Monitor lab/diagnostic results       Problem: Pain  Goal: Pain at adequate level as identified by patient  Outcome: Progressing   02/11/17 0054   Goal/Interventions addressed this shift   Pain at adequate level as identified by patient Identify patient comfort function goal;Assess for risk of opioid induced respiratory depression, including snoring/sleep apnea. Alert healthcare team of risk factors identified.;Assess pain on admission, during daily assessment and/or before any "as needed" intervention(s);Reassess pain within 30-60 minutes of any procedure/intervention, per Pain Assessment, Intervention, Reassessment (AIR) Cycle;Evaluate if patient comfort function goal is met;Consult/collaborate with Pain Service;Offer non-pharmacological pain management interventions       Problem: Side Effects from Pain Analgesia  Goal: Patient will experience minimal side effects of analgesic therapy  Outcome: Progressing   02/11/17 0054   Goal/Interventions addressed this shift   Patient will experience minimal side effects of analgesic therapy Monitor/assess patient's respiratory status (RR  depth, effort, breath sounds);Assess for changes in cognitive function;Prevent/manage side effects per LIP orders (i.e. nausea, vomiting, pruritus, constipation, urinary retention, etc.);Evaluate for opioid-induced sedation with appropriate assessment tool (i.e. POSS)       Problem: Discharge Barriers  Goal: Patient will be discharged home or other facility with appropriate resources  Outcome: Progressing   02/11/17 0054   Goal/Interventions addressed this shift   Discharge to home or other facility with appropriate resources Provide appropriate patient education;Provide information on available health resources;Initiate discharge planning       Problem: Psychosocial and Spiritual Needs  Goal: Demonstrates ability to cope with hospitalization/illness  Outcome: Progressing   02/11/17 0054   Goal/Interventions addressed this shift   Demonstrates ability to cope with hospitalizations/illness Encourage verbalization of feelings/concerns/expectations;Provide quiet environment;Assist patient to identify own strengths and abilities;Encourage patient to set small goals for self       Problem: Compromised Tissue integrity  Goal: Damaged tissue is healing and protected  Outcome: Progressing   02/11/17 0054   Goal/Interventions addressed this shift   Damaged tissue is healing and protected  Monitor/assess Braden scale every shift;Reposition patient every 2 hours and as needed unless able to reposition self;Provide wound care per wound care algorithm;Increase activity as tolerated/progressive mobility;Relieve pressure to bony prominences for patients at moderate and high risk;Keep intact skin clean and dry;Use bath wipes, not soap and water, for daily bathing;Avoid shearing injuries     Goal: Nutritional status is improving  Outcome: Progressing   02/11/17 0054   Goal/Interventions addressed this shift   Nutritional status is improving Assist patient with eating;Allow adequate time for meals;Encourage patient to take dietary  supplement(s) as ordered;Collaborate with Clinical Nutritionist;Include patient/patient care companion in decisions related to nutrition       Problem: Moderate/High Fall Risk Score >5  Goal: Patient will  remain free of falls  Outcome: Progressing   02/10/17 1928   OTHER   Moderate Risk (6-13) MOD-Place Fall Risk level on whiteboard in room       Comments: Pt is a moderate falls risk. Nurse educated pt on the use of the call bell to obtain staff assistance for all transfers and ambulation. Pt demonstrated understanding on use of call bell and verbally contracted with the bedside nurse to call for assistance before attempting to get out of bed. Pt room is clean, clutter free and adequately lit. Pt call bell, personal belongings and bedside table are within reach. Pt has non slip socks on. Intentional hourly rounding to continue per protocol.

## 2017-02-11 NOTE — Progress Notes (Signed)
PROGRESS NOTE    Date Time: 02/11/17 9:42 AM  Patient Name: [name],[name]  Attending Physician: Schuyler Amor, MD    Assessment:   Symptomatic anemia   Tachycardia   Hypotension   Chronic ulcerative colitis   AKI multifactorial   Plan:   Colonoscopy  today   H/h stable monitor BID   Follow up post procedure recommendations   AC on hold   SCD for DVT prophylaxis   Case d/w patient and staff care coordinated   History of Present Illness:   [name] is a 83 y.o. male who presents to the hospital with abnormal labs with severe anemia in ED he was hypotensive seen by [facility] for evaluation of admission to ICU however his repeat BP showed improvement and he is  admitted to [facility]   He c/o feeling weak and dizziness   Denies any abdominal pain , nausea vomiting   No hematemesis melena or hematochezia  No hemoptysis, cough or fever   No cp or SOB   Rest of ROS  negative    Patient was seen by me in the ED last night however he became hypotensive and tachycardiac and [facility] was consulted for possible ICU admission   Past Medical History:     Past Medical History:   Diagnosis Date   . Anemia     iron low   . DOE (dyspnea on exertion)     mild   . Glaucoma NEC    . Hypertensive disorder    . Skin cancer    . Ulcerative colitis, chronic    . Vision abnormalities        Past Surgical History:     Past Surgical History:   Procedure Laterality Date   . EGD, COLONOSCOPY  04/03/2012    Procedure: EGD, COLONOSCOPY;  Surgeon: Lestine Mount, MD;  Location: [id number];  Service: Gastroenterology;  Laterality: N/A;   . EYE SURGERY      cataract       Family History:   History reviewed. No pertinent family history.    Social History:     Social History     Social History   . Marital status: Married     Spouse name: N/A   . Number of children: N/A   . Years of education: N/A     Social History Main Topics   . Smoking status: Former Smoker     Packs/day: 1.00     Years: 12.00     Quit date: 12/18/1961   . Smokeless tobacco: Never Used   .  Alcohol use 0.0 oz/week      Comment: 0.5oz/d   . Drug use: No   . Sexual activity: Not on file     Other Topics Concern   . Not on file     Social History Narrative   . No narrative on file       Allergies:   No Known Allergies    Medications:     Prescriptions Prior to Admission   Medication Sig   . IRON PO Take 200 mg by mouth daily.   Marland Kitchen lisinopril (PRINIVIL,ZESTRIL) 5 MG tablet Take by mouth daily.       Review of Systems:   A comprehensive review of systems was: per HPI     Physical Exam:     Vitals:    02/11/17 0920   BP: 97/68   Pulse: 73   Resp:    Temp:  SpO2:        Intake and Output Summary (Last 24 hours) at Date Time    Intake/Output Summary (Last 24 hours) at 02/11/17 0942  Last data filed at 02/11/17 1610   Gross per 24 hour   Intake             2520 ml   Output             2675 ml   Net             -155 ml       General appearance - alert, well appearing, and in no distress  Mouth - mucous membranes moist, pharynx normal without lesions  Neck - supple, no significant adenopathy  Lymphatics - no palpable lymphadenopathy, no hepatosplenomegaly  Chest - clear to auscultation, no wheezes, rales or rhonchi, symmetric air entry  Heart - S1 and S2 normal, no murmurs noted, no gallops noted, no JVD, tachycardic   Abdomen - soft, nontender, nondistended, no masses or organomegaly  Neurological - alert, oriented, normal speech, no focal findings or movement disorder noted  Musculoskeletal - no joint tenderness, deformity or swelling  Extremities - peripheral pulses normal, no pedal edema, no clubbing or cyanosis  Skin - normal coloration and turgor, no rashes, no suspicious skin lesions noted         Labs:     Results     Procedure Component Value Units Date/Time    Magnesium [[id number]] Collected:  02/11/17 0915    Specimen:  Blood Updated:  02/11/17 0932     Magnesium 2.2 mg/dL     Lipid panel [[id number]] Collected:  02/11/17 0915    Specimen:  Blood Updated:  02/11/17 0915    Narrative:       Fasting  specimen    Hemoglobin and hematocrit, blood [[id number]]  (Abnormal) Collected:  02/11/17 0840    Specimen:  Blood Updated:  02/11/17 0850     Hgb 8.9 (L) g/dL      Hematocrit 78.4 (L) %     Comprehensive metabolic panel [[id number]]  (Abnormal) Collected:  02/11/17 0135    Specimen:  Blood Updated:  02/11/17 0206     Glucose 107 (H) mg/dL      BUN 13.2 (H) mg/dL      Creatinine 1.8 (H) mg/dL      Sodium 440 mEq/L      Potassium 5.0 mEq/L      Chloride 105 mEq/L      CO2 20 (L) mEq/L      Calcium 8.6 mg/dL      Protein, Total 6.4 g/dL      Albumin 3.5 g/dL      AST (SGOT) 11 U/L      ALT 10 U/L      Alkaline Phosphatase 73 U/L      Bilirubin, Total 0.9 mg/dL      Globulin 2.9 g/dL      Albumin/Globulin Ratio 1.2    GFR [[id number]] Collected:  02/11/17 0135     Updated:  02/11/17 0206     EGFR 35.8    CBC and differential [[id number]]  (Abnormal) Collected:  02/11/17 0135    Specimen:  Blood from Blood Updated:  02/11/17 0150     WBC 10.02 x10 3/uL      Hgb 9.4 (L) g/dL      Hematocrit 95.6 (L) %      Platelets 333 x10 3/uL      RBC 3.60 (L) x10  6/uL      MCV 88.3 fL      MCH 26.1 (L) pg      MCHC 29.6 (L) g/dL      RDW 15 %      MPV 10.6 fL      Neutrophils 82.1 %      Lymphocytes Automated 3.8 %      Monocytes 12.4 %      Eosinophils Automated 0.8 %      Basophils Automated 0.4 %      Immature Granulocyte 0.5 %      Nucleated RBC 0.6 /100 WBC      Neutrophils Absolute 8.23 (H) x10 3/uL      Abs Lymph Automated 0.38 (L) x10 3/uL      Abs Mono Automated 1.24 (H) x10 3/uL      Abs Eos Automated 0.08 x10 3/uL      Absolute Baso Automated 0.04 x10 3/uL      Absolute Immature Granulocyte 0.05 x10 3/uL      Absolute NRBC 0.06 (H) x10 3/uL     MRSA culture [[id number]] Collected:  02/09/17 2319    Specimen:  Body Fluid from Nasal/Throat ASC Admission Updated:  02/10/17 2212    Narrative:       ORDER#: [id number]                                    ORDERED BY: York Grice, Jasneet Schobert  SOURCE: Nares and Throat                              COLLECTED:  02/09/17 23:19  ANTIBIOTICS AT COLL.:                                RECEIVED :  02/10/17 01:52  Culture MRSA Surveillance                  FINAL       02/10/17 22:12  02/10/17   Negative for Methicillin Resistant Staph aureus from Nares and             Negative for Methicillin Resistant Staph aureus from Throat      B-type Natriuretic Peptide [[id number]]  (Abnormal) Collected:  02/10/17 1546    Specimen:  Blood Updated:  02/10/17 1619     B-Natriuretic Peptide 2,921 (H) pg/mL     Hemoglobin and hematocrit, blood [[id number]]  (Abnormal) Collected:  02/10/17 1545    Specimen:  Blood Updated:  02/10/17 1558     Hgb 9.2 (L) g/dL      Hematocrit 62.9 (L) %     Urinalysis with microscopic [[id number]] Collected:  02/10/17 1217    Specimen:  Urine Updated:  02/10/17 1349     Urine Type Clean Catch     Color, UA Straw     Clarity, UA Clear     Specific Gravity UA 1.008     Urine pH 6.0     Leukocyte Esterase, UA Negative     Nitrite, UA Negative     Protein, UR Negative     Glucose, UA Negative     Ketones UA Negative     Urobilinogen, UA Normal mg/dL      Bilirubin, UA Negative     Blood, UA Negative  RBC, UA 0 - 2 /hpf               Rads:   Radiological Procedure reviewed.    Signed by: Schuyler Amor MD  [id number]

## 2017-02-11 NOTE — Progress Notes (Signed)
Patient taken to endoscopy; report called to GE RN; daughter at bedside.

## 2017-02-11 NOTE — Transfer of Care (Signed)
Anesthesia Transfer of Care Note    Patient: [name]    Procedures performed: Procedure(s):  EGD, COLONOSCOPY    Anesthesia type: General TIVA    Patient location:Phase I PACU    Last vitals:   Vitals:    02/11/17 1324   BP: 96/52   Pulse: 87   Resp: 18   Temp:    SpO2: 100%       Post pain: Patient not complaining of pain, continue current therapy      Mental Status:sedated    Respiratory Function: tolerating face mask    Cardiovascular: stable    Nausea/Vomiting: patient not complaining of nausea or vomiting    Hydration Status: adequate    Post assessment: no apparent anesthetic complications    Signed by: Montez Morita  02/11/17 1:26 PM

## 2017-02-11 NOTE — Progress Notes (Signed)
Patient returned from GE lab in stable condition; abke to walk from stretcher to bed with minimal SBA; VSS; A&O X4, however remains a little forgetful from sedation; daughter at bedside; GE findings discussed again with daughter and patient; diet advanced to clear liquid per orders; continue POC.

## 2017-02-12 LAB — HEMOGLOBIN AND HEMATOCRIT, BLOOD
Hematocrit: 28.5 % — ABNORMAL LOW (ref 42.0–52.0)
Hematocrit: 28.9 % — ABNORMAL LOW (ref 42.0–52.0)
Hgb: 8.4 g/dL — ABNORMAL LOW (ref 13.0–17.0)
Hgb: 8.5 g/dL — ABNORMAL LOW (ref 13.0–17.0)

## 2017-02-12 LAB — CBC AND DIFFERENTIAL
Absolute NRBC: 0.02 10*3/uL — ABNORMAL HIGH
Basophils Absolute Automated: 0.02 10*3/uL (ref 0.00–0.20)
Basophils Automated: 0.2 %
Eosinophils Absolute Automated: 0.18 10*3/uL (ref 0.00–0.70)
Eosinophils Automated: 2 %
Hematocrit: 26.1 % — ABNORMAL LOW (ref 42.0–52.0)
Hgb: 7.7 g/dL — ABNORMAL LOW (ref 13.0–17.0)
Immature Granulocytes Absolute: 0.05 10*3/uL
Immature Granulocytes: 0.6 %
Lymphocytes Absolute Automated: 0.28 10*3/uL — ABNORMAL LOW (ref 0.50–4.40)
Lymphocytes Automated: 3.2 %
MCH: 26.3 pg — ABNORMAL LOW (ref 28.0–32.0)
MCHC: 29.5 g/dL — ABNORMAL LOW (ref 32.0–36.0)
MCV: 89.1 fL (ref 80.0–100.0)
MPV: 10.7 fL (ref 9.4–12.3)
Monocytes Absolute Automated: 1 10*3/uL (ref 0.00–1.20)
Monocytes: 11.3 %
Neutrophils Absolute: 7.34 10*3/uL (ref 1.80–8.10)
Neutrophils: 82.7 %
Nucleated RBC: 0.2 /100 WBC (ref 0.0–1.0)
Platelets: 235 10*3/uL (ref 140–400)
RBC: 2.93 10*6/uL — ABNORMAL LOW (ref 4.70–6.00)
RDW: 15 % (ref 12–15)
WBC: 8.87 10*3/uL (ref 3.50–10.80)

## 2017-02-12 LAB — ECG 12-LEAD
Atrial Rate: 76 {beats}/min
P Axis: 47 degrees
P-R Interval: 164 ms
Q-T Interval: 434 ms
QRS Duration: 94 ms
QTC Calculation (Bezet): 488 ms
R Axis: -25 degrees
T Axis: 69 degrees
Ventricular Rate: 76 {beats}/min

## 2017-02-12 LAB — COMPREHENSIVE METABOLIC PANEL
ALT: 10 U/L (ref 0–55)
AST (SGOT): 17 U/L (ref 5–34)
Albumin/Globulin Ratio: 1.2 (ref 0.9–2.2)
Albumin: 2.8 g/dL — ABNORMAL LOW (ref 3.5–5.0)
Alkaline Phosphatase: 62 U/L (ref 38–106)
BUN: 34 mg/dL — ABNORMAL HIGH (ref 9.0–28.0)
Bilirubin, Total: 0.5 mg/dL (ref 0.2–1.2)
CO2: 18 mEq/L — ABNORMAL LOW (ref 22–29)
Calcium: 8 mg/dL (ref 7.9–10.2)
Chloride: 110 mEq/L (ref 100–111)
Creatinine: 1.4 mg/dL — ABNORMAL HIGH (ref 0.7–1.3)
Globulin: 2.4 g/dL (ref 2.0–3.6)
Glucose: 92 mg/dL (ref 70–100)
Potassium: 4.2 mEq/L (ref 3.5–5.1)
Protein, Total: 5.2 g/dL — ABNORMAL LOW (ref 6.0–8.3)
Sodium: 136 mEq/L (ref 136–145)

## 2017-02-12 LAB — MAGNESIUM: Magnesium: 1.9 mg/dL (ref 1.6–2.6)

## 2017-02-12 LAB — POTASSIUM: Potassium: 4.3 mEq/L (ref 3.5–5.1)

## 2017-02-12 LAB — GFR: EGFR: 47.8

## 2017-02-12 MED ORDER — PANTOPRAZOLE SODIUM 40 MG PO TBEC
40.00 mg | DELAYED_RELEASE_TABLET | Freq: Two times a day (BID) | ORAL | Status: DC
Start: 2017-02-12 — End: 2017-02-23
  Administered 2017-02-12 – 2017-02-23 (×20): 40 mg via ORAL
  Filled 2017-02-12 (×20): qty 1

## 2017-02-12 MED ORDER — CARVEDILOL 6.25 MG PO TABS
3.1250 mg | ORAL_TABLET | Freq: Two times a day (BID) | ORAL | Status: DC
Start: 2017-02-12 — End: 2017-02-23
  Administered 2017-02-12 – 2017-02-22 (×16): 3.125 mg via ORAL
  Filled 2017-02-12 (×21): qty 1

## 2017-02-12 MED ORDER — LISINOPRIL 5 MG PO TABS
2.50 mg | ORAL_TABLET | Freq: Two times a day (BID) | ORAL | Status: DC
Start: 2017-02-12 — End: 2017-02-13
  Administered 2017-02-12 – 2017-02-13 (×2): 2.5 mg via ORAL
  Filled 2017-02-12 (×2): qty 1

## 2017-02-12 NOTE — Progress Notes (Signed)
Medicine Shift Note    Patient Lines/Drains/Airways Status    Active Lines, Drains and Airways     Name:   Placement date:   Placement time:   Site:   Days:    Peripheral IV 02/09/17 Left Hand  02/09/17    1805    Hand    2                Last BM: 02/11/2017    Pending Orders: Q8 H&H    Discharge Plan: 02/13/2017    POC: Monitor H&h, cardiac monitor and f/u by cardiology    Skin: Intact    Tele: Yes, NSR, afib briefly at times, a new onset    Activity: Standby assist    Interpreter Needs: No    Shift Note: Pt A+Ox4, vitals stable, afebrile, at RA. Pt denies chest pain and SOB, no dizziness, waiting for cardiac clearance and observation of H&H over night. Safety needs and fall precautions discussed with pt, pt verbalized understanding. Call light and bedside table within pt reach, bed at lowest position, non-skid socks on, bedside rails up, and hourly rounding maintained.

## 2017-02-12 NOTE — Progress Notes (Signed)
Medicine Shift Note    Patient Lines/Drains/Airways Status    Active Lines, Drains and Airways     Name:   Placement date:   Placement time:   Site:   Days:    Peripheral IV 02/09/17 Left Hand  02/09/17    1805    Hand    2                Last BM: 02/11/17    Pending Orders: none    Discharge Plan: TBD; possible d/c home 11/24    POC: Pt is aware    Skin: CDI    Tele: YES, SR with PSVTs RANGE -130-140    Activity: Ambulatory w/o device, X 1 assist    Interpreter Needs: no}, Used:no, Method: n/a Waiver Need/present:  Fluent in english language    Shift Note: Ion IVF NS at 100 ml/hr. PT had x2 PSVTs range HR 130-140 , EKG done: SR with SVTs  And prolonged QT. Dr. Kristeen Miss notified , new order mg and potassium STAT. Labs resulted, unremarkable, on H/H BID.

## 2017-02-12 NOTE — PT Eval Note (Signed)
[facility]   Physical Therapy Evaluation   Patient: [name]    MRN#: [id number]   Unit: [facility] TOWER 9  Bed: F942/F942.01    Discharge Recommendations:   Discharge Recommendation: Home with no needs   DME Recommendation: no needs    Assessment:   [name] is a 83 y.o. male admitted 02/09/2017.  Patient in bed upon arrival, amenable to physical therapy evaluation. Patient performed all functional mobility independently. No further acute PT needs at this time. D/C PT. Recommend RN staff enable OOB for all meals and walking in hallway 2-3x/day to maintain patient's independence during hospitalization.    Therapy Diagnosis: impaired functional mobility    Impairments:   D/C PT    Rehabilitation Potential: good    Treatment Activities: Evaluation, gait training  Educated the patient to role of physical therapy, plan of care, goals of therapy and safety with mobility and ADLs.    Plan:   Treatment/Interventions: No skilled interventions needed at this time     PT Frequency: one time visit   Risks/Benefits/POC Discussed with Pt/Family: With patient        Precautions and Contraindications:    None    Consult received for [name] for PT Evaluation and Treatment.  Patient's medical condition is appropriate for Physical therapy intervention at this time.    Medical Diagnosis: Hypotension, unspecified hypotension type [I95.9]  Gastrointestinal hemorrhage, unspecified gastrointestinal hemorrhage type [K92.2]  GI bleed [K92.2]      History of Present Illness:   Per H&P:  [name] is a 83 y.o. male admitted on 02/09/2017 with dizziness and possible GI bleed.    Past Medical/Surgical History:  Past Medical History:   Diagnosis Date   . Anemia     iron low   . DOE (dyspnea on exertion)     mild   . Glaucoma NEC    . Hypertensive disorder    . Skin cancer     2006 removed mole   . Ulcerative colitis, chronic    . Vision abnormalities        Social History:   Prior Level of  Function:  Prior level of function: Independent with all ADLs  Baseline activity level: community distances  DME Currently at Home: Front wheeled walker    Subjective: "What do you want me to do?"   Patient is agreeable to participation in the therapy session. Nursing clears patient for therapy.     Patient goal: return home    Pain Assessment:  No report of pain    Objective:   Observation of Patient:   Patient with peripheral IV in place.    Cognition/Neuro Status:  Behavior: Cooperative, Pleasant   Orientation Level: Person, Place , Time and Situation  Following Commands: 3 step- able to follow 3 commands without prompting  Safety Awareness: Independent    Functional Mobility:  PMP - Progressive Mobility Protocol   PMP Activity: Step 6 - Walks in Room  Distance Walked (ft) (Step 6,7): 50 Feet    Musculoskeletal Examination:  Gross ROM  Right Upper Extremity ROM: within normal limits  Left Upper Extremity ROM: within normal limits  Right Lower Extremity ROM: within normal limits  Left Lower Extremity ROM: within normal limits    Gross Strength  Right Upper Extremity: 5/5  Left Upper Extremity: 5/5  Right Lower Extremity: 5/5  Left Lower Extremity: 5/5    Bed Mobility:     Supine  to Sit: Independent  Sit to Supine: Independent    Transfers:  Sit to Stand: Independent  Stand to Sit: Independent    Ambulation:  Distance: 50 Feet  Device: None  Assist Level: Independent  Pattern: WFL    Balance:  Static sitting: good   Dynamic sitting: good   Static standing: good   Dynamic standing: good     Sensation:  Right UE: intact   Left UE: intact   Right LE: intact   Left LE: intact     Participation and Activity Tolerance:  Participation Effort: good  Patient Endurance: good    Patient left with call bell within reach, all needs met, and all questions answered     SCD's off (as found)   Floor mat in place   Bed alarm off (as found)    Goals:   Patient will ambulate 19ft, without assistive device, independently. (GOAL  MET)    Time of treatment:   PT Received On: 02/12/17  Start Time: 1000  Stop Time: 1030  Time Calculation (min): 30 min    Daleen Squibb, PT, DPT

## 2017-02-12 NOTE — Plan of Care (Signed)
Problem: Safety  Goal: Patient will be free from injury during hospitalization  Outcome: Progressing   02/12/17 1204   Goal/Interventions addressed this shift   Patient will be free from injury during hospitalization  Assess patient's risk for falls and implement fall prevention plan of care per policy;Provide and maintain safe environment;Use appropriate transfer methods;Ensure appropriate safety devices are available at the bedside;Hourly rounding     Goal: Patient will be free from infection during hospitalization  Outcome: Progressing   02/12/17 1204   Goal/Interventions addressed this shift   Free from Infection during hospitalization Assess and monitor for signs and symptoms of infection;Monitor lab/diagnostic results       Problem: Pain  Goal: Pain at adequate level as identified by patient  Outcome: Completed Date Met: 02/12/17      Problem: Side Effects from Pain Analgesia  Goal: Patient will experience minimal side effects of analgesic therapy  Outcome: Completed Date Met: 02/12/17      Problem: Discharge Barriers  Goal: Patient will be discharged home or other facility with appropriate resources  Outcome: Completed Date Met: 02/12/17      Problem: Moderate/High Fall Risk Score >5  Goal: Patient will remain free of falls  Outcome: Progressing   02/12/17 0854   OTHER   Moderate Risk (6-13) LOW-Fall Interventions Appropriate for Low Fall Risk       Problem: Altered GI Function  Goal: Fluid and electrolyte balance are achieved/maintained  Outcome: Progressing   02/11/17 2050   Goal/Interventions addressed this shift   Fluid and electrolyte balance are achieved/maintained Monitor intake and output every shift;Monitor/assess lab values and report abnormal values;Provide adequate hydration;Assess for confusion/personality changes     Goal: Elimination patterns are normal or improving  Outcome: Progressing   02/12/17 1204   Goal/Interventions addressed this shift   Elimination patterns are normal or improving  Report abnormal assessment to physician;Anticipate/assist with toileting needs;Assess for normal bowel sounds;Monitor for abdominal discomfort;Assess for signs and symptoms of bleeding. Report signs of bleeding to physician;Assess for flatus     Goal: No bleeding  Outcome: Progressing   02/11/17 2050   Goal/Interventions addressed this shift   No bleeding  Monitor and assess vitals and hemodynamic parameters;Monitor/assess lab values and report abnormal values;Assess for bruising/petechia

## 2017-02-12 NOTE — Progress Notes (Signed)
PROGRESS NOTE    Date Time: 02/12/17 10:53 AM  Patient Name: EthanEthan [name]  Attending Physician: Schuyler Amor, MD    Assessment:   Symptomatic anemia   Tachycardia   Hypotension   Chronic ulcerative colitis   AKI multifactorial   New onset atrial fibrillation   Plan:   S/p colonoscopy and EGD c/w AVM  Hold AC   Episode of hypotension   Echo c/w low EF 36%  due to AKI  And hypotension hold ACEI   Cardiology to follow up   Decrease IVF   Continue PPI   SCD for DVT prophylaxis   Case d/w patient and staff care coordinated   History of Present Illness:   [name] is a 83 y.o. male who presents to the hospital with abnormal labs with severe anemia in ED he was hypotensive seen by [facility] for evaluation of admission to ICU however his repeat BP showed improvement and he is  admitted to [facility]   He c/o feeling weak and dizziness   Denies any abdominal pain , nausea vomiting   No hematemesis melena or hematochezia  No hemoptysis, cough or fever   No cp or SOB   Rest of ROS  negative    Patient was seen by me in the ED last night however he became hypotensive and tachycardiac and [facility] was consulted for possible ICU admission   Past Medical History:     Past Medical History:   Diagnosis Date   . Anemia     iron low   . DOE (dyspnea on exertion)     mild   . Glaucoma NEC    . Hypertensive disorder    . Skin cancer     2006 removed mole   . Ulcerative colitis, chronic    . Vision abnormalities        Past Surgical History:     Past Surgical History:   Procedure Laterality Date   . EGD, COLONOSCOPY  04/03/2012    Procedure: EGD, COLONOSCOPY;  Surgeon: Lestine Mount, MD;  Location: [facility];  Service: Gastroenterology;  Laterality: N/A;   . EYE SURGERY      cataract       Family History:   History reviewed. No pertinent family history.    Social History:     Social History     Social History   . Marital status: Married     Spouse name: N/A   . Number of children: N/A   . Years of education: N/A     Social History Main Topics    . Smoking status: Former Smoker     Packs/day: 1.00     Years: 12.00     Quit date: 12/18/1961   . Smokeless tobacco: Never Used   . Alcohol use 0.0 oz/week      Comment: 0.5oz/d   . Drug use: No   . Sexual activity: Not on file     Other Topics Concern   . Not on file     Social History Narrative   . No narrative on file       Allergies:   No Known Allergies    Medications:     Prescriptions Prior to Admission   Medication Sig   . IRON PO Take 200 mg by mouth daily.   Marland Kitchen lisinopril (PRINIVIL,ZESTRIL) 5 MG tablet Take by mouth daily.       Review of Systems:   A comprehensive review of systems was: per  HPI     Physical Exam:     Vitals:    02/12/17 1006   BP: 118/74   Pulse: (!) 109   Resp:    Temp:    SpO2:        Intake and Output Summary (Last 24 hours) at Date Time    Intake/Output Summary (Last 24 hours) at 02/12/17 1053  Last data filed at 02/12/17 0600   Gross per 24 hour   Intake             2400 ml   Output             1000 ml   Net             1400 ml       General appearance - alert, well appearing, and in no distress  Mouth - mucous membranes moist, pharynx normal without lesions  Neck - supple, no significant adenopathy  Lymphatics - no palpable lymphadenopathy, no hepatosplenomegaly  Chest - clear to auscultation, no wheezes, rales or rhonchi, symmetric air entry  Heart - S1 and S2 normal, no murmurs noted, no gallops noted, no JVD, tachycardic   Abdomen - soft, nontender, nondistended, no masses or organomegaly  Neurological - alert, oriented, normal speech, no focal findings or movement disorder noted  Musculoskeletal - no joint tenderness, deformity or swelling  Extremities - peripheral pulses normal, no pedal edema, no clubbing or cyanosis  Skin - normal coloration and turgor, no rashes, no suspicious skin lesions noted         Labs:     Results     Procedure Component Value Units Date/Time    Comprehensive metabolic panel [[id number]]  (Abnormal) Collected:  02/12/17 0546    Specimen:  Blood  Updated:  02/12/17 0650     Glucose 92 mg/dL      BUN 40.9 (H) mg/dL      Creatinine 1.4 (H) mg/dL      Sodium 811 mEq/L      Potassium 4.2 mEq/L      Chloride 110 mEq/L      CO2 18 (L) mEq/L      Calcium 8.0 mg/dL      Protein, Total 5.2 (L) g/dL      Albumin 2.8 (L) g/dL      AST (SGOT) 17 U/L      ALT 10 U/L      Alkaline Phosphatase 62 U/L      Bilirubin, Total 0.5 mg/dL      Globulin 2.4 g/dL      Albumin/Globulin Ratio 1.2    GFR [[id number]] Collected:  02/12/17 0546     Updated:  02/12/17 0650     EGFR 47.8    CBC and differential [[id number]]  (Abnormal) Collected:  02/12/17 0546    Specimen:  Blood from Blood Updated:  02/12/17 0629     WBC 8.87 x10 3/uL      Hgb 7.7 (L) g/dL      Hematocrit 46.9 (L) %      Platelets 235 x10 3/uL      RBC 2.93 (L) x10 6/uL      MCV 89.1 fL      MCH 26.3 (L) pg      MCHC 29.5 (L) g/dL      RDW 15 %      MPV 10.7 fL      Neutrophils 82.7 %      Lymphocytes Automated 3.2 %  Monocytes 11.3 %      Eosinophils Automated 2.0 %      Basophils Automated 0.2 %      Immature Granulocyte 0.6 %      Nucleated RBC 0.2 /100 WBC      Neutrophils Absolute 7.34 x10 3/uL      Abs Lymph Automated 0.28 (L) x10 3/uL      Abs Mono Automated 1.00 x10 3/uL      Abs Eos Automated 0.18 x10 3/uL      Absolute Baso Automated 0.02 x10 3/uL      Absolute Immature Granulocyte 0.05 x10 3/uL      Absolute NRBC 0.02 (H) x10 3/uL     Magnesium [[id number]] Collected:  02/12/17 0013    Specimen:  Blood Updated:  02/12/17 0028     Magnesium 1.9 mg/dL     Potassium [[id number]] Collected:  02/12/17 0013    Specimen:  Blood Updated:  02/12/17 0028     Potassium 4.3 mEq/L     Hemoglobin and hematocrit, blood [[id number]]  (Abnormal) Collected:  02/11/17 1645    Specimen:  Blood Updated:  02/11/17 1656     Hgb 8.4 (L) g/dL      Hematocrit 78.4 (L) %               Rads:   Radiological Procedure reviewed.    Signed by: Schuyler Amor MD  [id number]

## 2017-02-12 NOTE — Plan of Care (Signed)
Discussed with Dr. Cyndra Numbers regarding anticoagulation post EGD/Colonoscopy. Patient with high risk of rebleed with use of anticoagulation given multiple AVMs (stomach, duodenum and cecum) noted on endoscopic procedures. Will need to weigh risk vs benefit of any use of anticoagulants given endoscopic findings.

## 2017-02-12 NOTE — Progress Notes (Signed)
[facility]   Casey Fye M. Lucianne Muss, MD, [facility]  PROGRESS NOTE  Office: [phone]  Cell phone [phone]                                       Date Time: 02/12/17 2:52 PM  Patient Name: Stogdill,[name]      Assessment:    Ventricular tachycardia, non-sustained, recorded on tele on 02/10/17 at 12:07- asymptomatic and hemodynamically stable.    Atrial fibrillation, new onset, with rapid ventricular response   CHF with reduced EF of 36%   Severe pulmonary hypertension with an estimated PASP of 63 mmHg by echo   GI bleeding- Noted Hb drop from 9.4 to 7.7 last 24 h.    Anemia   CKD stage III   Dyslipidemia   Plan:    Hold off on anticoagulation for AF as he may be having active GI bleeding.   D/c IVF   D/c Metoprolol 25 mg bid   Start coreg 3.125 mg bid   Start Lisinopril 2.5 mg bid   Will need left and right heart cath before discharge  Recent Cardiac Work up:   Echo 02/11/17:   The left ventricle is normal in size.    * Left ventricular systolic function is moderately decreased with an  ejection fraction by Biplane Method of Discs of  36 %.    * Left Ventricular diastolic filling parameters are consistent with Grade II  diastolic dysfunction (pseudonormal pattern).    * Right ventricular size and systolic function are normal.    * Mild bi-atrial enlargement.    * Moderate to severe mitral valve regurgitation.    * There is mild tricuspid regurgitation.    * Moderate pulmonary hypertension with estimated right ventricular systolic  pressure of  63.29 mmHg.  EKG 02/09/17:   AF with RVR at 130 bpm  NS STT abnormalities  BNP 02/10/17:  2921  Subjective:   Feeling ok. Denies chest pain or shortness of breath or palpitation.   Medications:     Current Facility-Administered Medications   Medication Dose Route Frequency   . metoprolol tartrate  25 mg Oral Q12H SCH   . pantoprazole  40 mg Oral BID         Physical Exam:     Vitals:    02/12/17 1140   BP: 107/72   Pulse: 72   Resp:    Temp:    SpO2: 96%        Intake and Output Summary (Last 24 hours) at Date Time    Intake/Output Summary (Last 24 hours) at 02/12/17 1452  Last data filed at 02/12/17 0600   Gross per 24 hour   Intake             1150 ml   Output             1000 ml   Net              150 ml       General appearance - in no distress   Head: NC/AT, mm moist  Neck - supple, Carotids 2+, no bruit, no JVD   Chest - clear to auscultation, no wheezes, rales or rhonchi, symmetric air entry   Heart - normal rate and regular rhythm, S1 and S2 normal, no murmurs   Abdomen - soft, nontender, nondistended, no masses or organomegaly  Extremities - peripheral pulses normal, no pedal edema, no clubbing or cyanosis        Labs:   Recent CBC   Recent Labs      02/12/17   1432  02/12/17   0546   RBC   --   2.93*   Hgb  8.4*  7.7*   Hematocrit  28.5*  26.1*   MCV   --   89.1   MCH   --   26.3*   MCHC   --   29.5*   RDW   --   15   MPV   --   10.7   Platelets   --   235     Recent BMP   Recent Labs      02/12/17   0546   Glucose  92   BUN  34.0*   Creatinine  1.4*   Calcium  8.0   Sodium  136   Potassium  4.2   Chloride  110   CO2  18*     Recent CMP   Recent Labs      02/12/17   0546   Glucose  92   BUN  34.0*   Creatinine  1.4*   Sodium  136   CO2  18*   Calcium  8.0   Albumin  2.8*   AST (SGOT)  17   ALT  10   Globulin  2.4     Recent CARDIAC ENZYMES No results for input(s): TROPONIN, ISTATTROPONI, CK, CKMB in the last 24 hours.    Invalid input(s): TROPONINT  Recent TSH No results for input(s): TSH in the last 24 hours.  Recent URIC ACID Invalid input(s): URIC  Recent BLOOD CULTURE No results for input(s): CXBLD in the last 24 hours.  Recent URINE CULTURE Invalid input(s): CXURN  Recent OCCULT BLOOD Invalid input(s): OCCBL  Recent PT/PTT No results for input(s): INR, PTT in the last 24 hours.    Invalid input(s): PTI, COUM, COUMP, ACOAG, ACOAP  Recent PT/INR No results for input(s): INR in the last 24 hours.    Invalid input(s): PTI, COUM, COUMP  Recent LIPID PANEL  No results for input(s): CHOL, TRIG, HDL in the last 24 hours.    Invalid input(s): LDLC, VLDLC, LRAT  Recent ABG No results for input(s): TEMP, FIO2, RATE, MODE, ETCO2, PEEP in the last 24 hours.    Invalid input(s): APH, APCO2, APO2, AHCO3, ATCO2, ABE, AOSAT, ABGS, ALLEN, STATS, O2DEL, O2FLO, PRESS, VNTMN, PRSUP, TIVOL      Rads:   Radiological Procedure reviewed.    Signed by: Valerie Roys, MD, [facility]

## 2017-02-13 ENCOUNTER — Inpatient Hospital Stay: Payer: Medicare Other

## 2017-02-13 LAB — CBC AND DIFFERENTIAL
Absolute NRBC: 0 10*3/uL
Basophils Absolute Automated: 0.03 10*3/uL (ref 0.00–0.20)
Basophils Automated: 0.3 %
Eosinophils Absolute Automated: 0.33 10*3/uL (ref 0.00–0.70)
Eosinophils Automated: 2.9 %
Hematocrit: 28.9 % — ABNORMAL LOW (ref 42.0–52.0)
Hgb: 8.5 g/dL — ABNORMAL LOW (ref 13.0–17.0)
Immature Granulocytes Absolute: 0.06 10*3/uL — ABNORMAL HIGH
Immature Granulocytes: 0.5 %
Lymphocytes Absolute Automated: 0.37 10*3/uL — ABNORMAL LOW (ref 0.50–4.40)
Lymphocytes Automated: 3.3 %
MCH: 26.2 pg — ABNORMAL LOW (ref 28.0–32.0)
MCHC: 29.4 g/dL — ABNORMAL LOW (ref 32.0–36.0)
MCV: 88.9 fL (ref 80.0–100.0)
MPV: 10.4 fL (ref 9.4–12.3)
Monocytes Absolute Automated: 1.24 10*3/uL — ABNORMAL HIGH (ref 0.00–1.20)
Monocytes: 11.1 %
Neutrophils Absolute: 9.17 10*3/uL — ABNORMAL HIGH (ref 1.80–8.10)
Neutrophils: 81.9 %
Nucleated RBC: 0 /100 WBC (ref 0.0–1.0)
Platelets: 242 10*3/uL (ref 140–400)
RBC: 3.25 10*6/uL — ABNORMAL LOW (ref 4.70–6.00)
RDW: 15 % (ref 12–15)
WBC: 11.2 10*3/uL — ABNORMAL HIGH (ref 3.50–10.80)

## 2017-02-13 LAB — COMPREHENSIVE METABOLIC PANEL
ALT: 12 U/L (ref 0–55)
AST (SGOT): 15 U/L (ref 5–34)
Albumin/Globulin Ratio: 1.2 (ref 0.9–2.2)
Albumin: 3 g/dL — ABNORMAL LOW (ref 3.5–5.0)
Alkaline Phosphatase: 73 U/L (ref 38–106)
BUN: 33 mg/dL — ABNORMAL HIGH (ref 9.0–28.0)
Bilirubin, Total: 0.5 mg/dL (ref 0.2–1.2)
CO2: 16 mEq/L — ABNORMAL LOW (ref 22–29)
Calcium: 8.5 mg/dL (ref 7.9–10.2)
Chloride: 110 mEq/L (ref 100–111)
Creatinine: 1.4 mg/dL — ABNORMAL HIGH (ref 0.7–1.3)
Globulin: 2.6 g/dL (ref 2.0–3.6)
Glucose: 103 mg/dL — ABNORMAL HIGH (ref 70–100)
Potassium: 4.6 mEq/L (ref 3.5–5.1)
Protein, Total: 5.6 g/dL — ABNORMAL LOW (ref 6.0–8.3)
Sodium: 137 mEq/L (ref 136–145)

## 2017-02-13 LAB — HEMOGLOBIN AND HEMATOCRIT, BLOOD
Hematocrit: 29.6 % — ABNORMAL LOW (ref 42.0–52.0)
Hematocrit: 31.7 % — ABNORMAL LOW (ref 42.0–52.0)
Hgb: 8.7 g/dL — ABNORMAL LOW (ref 13.0–17.0)
Hgb: 9.4 g/dL — ABNORMAL LOW (ref 13.0–17.0)

## 2017-02-13 LAB — MAGNESIUM: Magnesium: 2.2 mg/dL (ref 1.6–2.6)

## 2017-02-13 LAB — B-TYPE NATRIURETIC PEPTIDE: B-Natriuretic Peptide: 3150 pg/mL — ABNORMAL HIGH (ref 0–100)

## 2017-02-13 LAB — GFR: EGFR: 47.8

## 2017-02-13 MED ORDER — FUROSEMIDE 10 MG/ML IJ SOLN
40.00 mg | Freq: Once | INTRAMUSCULAR | Status: AC
Start: 2017-02-13 — End: 2017-02-13
  Administered 2017-02-13: 06:00:00 40 mg via INTRAVENOUS
  Filled 2017-02-13: qty 4

## 2017-02-13 MED ORDER — ALBUTEROL-IPRATROPIUM 2.5-0.5 (3) MG/3ML IN SOLN
3.00 mL | RESPIRATORY_TRACT | Status: DC | PRN
Start: 2017-02-13 — End: 2017-02-23
  Administered 2017-02-13 (×2): 3 mL via RESPIRATORY_TRACT
  Filled 2017-02-13 (×2): qty 3

## 2017-02-13 MED ORDER — LISINOPRIL 5 MG PO TABS
5.00 mg | ORAL_TABLET | Freq: Two times a day (BID) | ORAL | Status: DC
Start: 2017-02-13 — End: 2017-02-17
  Administered 2017-02-13 – 2017-02-16 (×6): 5 mg via ORAL
  Filled 2017-02-13 (×7): qty 1

## 2017-02-13 MED ORDER — FUROSEMIDE 10 MG/ML IJ SOLN
40.00 mg | Freq: Every day | INTRAMUSCULAR | Status: DC
Start: 2017-02-14 — End: 2017-02-15
  Administered 2017-02-14: 11:00:00 40 mg via INTRAVENOUS
  Filled 2017-02-13: qty 4

## 2017-02-13 NOTE — Progress Notes (Signed)
PROGRESS NOTE    Date Time: 02/13/17 9:39 AM  Patient Name: [name] [name]  Attending Physician: Schuyler Amor, MD    Assessment:   Symptomatic anemia   Tachycardia   Hypotension   Chronic ulcerative colitis   AKI multifactorial   New onset atrial fibrillation   Acute on chronic systolic HFrEF  Plan:   S/p colonoscopy and EGD c/w AVM  Hold AC   Echo c/w low EF 36% systolic HF acute on chronic   LHC/RHC by cardiology   Lasix 40 mg iv daily   Continue PPI   SCD for DVT prophylaxis   Case d/w patient and staff care coordinated   History of Present Illness:   [name] [name] is a 83 y.o. male who presents to the hospital with abnormal labs with severe anemia in ED he was hypotensive seen by [facility] for evaluation of admission to ICU however his repeat BP showed improvement and he is  admitted to [facility]   He c/o feeling weak and dizziness   Denies any abdominal pain , nausea vomiting   No hematemesis melena or hematochezia  No hemoptysis, cough or fever   No cp or SOB   Rest of ROS  negative    Patient was seen by me in the ED last night however he became hypotensive and tachycardiac and [facility] was consulted for possible ICU admission   Past Medical History:     Past Medical History:   Diagnosis Date   . Anemia     iron low   . DOE (dyspnea on exertion)     mild   . Glaucoma NEC    . Hypertensive disorder    . Skin cancer     2006 removed mole   . Ulcerative colitis, chronic    . Vision abnormalities        Past Surgical History:     Past Surgical History:   Procedure Laterality Date   . EGD, COLONOSCOPY  04/03/2012    Procedure: EGD, COLONOSCOPY;  Surgeon: Lestine Mount, MD;  Location: [facility];  Service: Gastroenterology;  Laterality: N/A;   . EYE SURGERY      cataract       Family History:   History reviewed. No pertinent family history.    Social History:     Social History     Social History   . Marital status: Married     Spouse name: N/A   . Number of children: N/A   . Years of education: N/A     Social History Main  Topics   . Smoking status: Former Smoker     Packs/day: 1.00     Years: 12.00     Quit date: 12/18/1961   . Smokeless tobacco: Never Used   . Alcohol use 0.0 oz/week      Comment: 0.5oz/d   . Drug use: No   . Sexual activity: Not on file     Other Topics Concern   . Not on file     Social History Narrative   . No narrative on file       Allergies:   No Known Allergies    Medications:     Prescriptions Prior to Admission   Medication Sig   . IRON PO Take 200 mg by mouth daily.   Marland Kitchen lisinopril (PRINIVIL,ZESTRIL) 5 MG tablet Take by mouth daily.       Review of Systems:   A comprehensive review of systems was: per HPI  Physical Exam:     Vitals:    02/13/17 0758   BP: 123/80   Pulse: 90   Resp: 17   Temp: 97.7 F (36.5 C)   SpO2: 98%       Intake and Output Summary (Last 24 hours) at Date Time    Intake/Output Summary (Last 24 hours) at 02/13/17 0939  Last data filed at 02/13/17 0835   Gross per 24 hour   Intake                0 ml   Output              850 ml   Net             -850 ml       General appearance - alert, well appearing, and in no distress  Mouth - mucous membranes moist, pharynx normal without lesions  Neck - supple, no significant adenopathy  Lymphatics - no palpable lymphadenopathy, no hepatosplenomegaly  Chest - clear to auscultation, no wheezes, rales or rhonchi, symmetric air entry  Heart - S1 and S2 normal, no murmurs noted, no gallops noted, no JVD, tachycardic   Abdomen - soft, nontender, nondistended, no masses or organomegaly  Neurological - alert, oriented, normal speech, no focal findings or movement disorder noted  Musculoskeletal - no joint tenderness, deformity or swelling  Extremities - peripheral pulses normal, no pedal edema, no clubbing or cyanosis  Skin - normal coloration and turgor, no rashes, no suspicious skin lesions noted         Labs:     Results     Procedure Component Value Units Date/Time    B-type Natriuretic Peptide [[id number]]  (Abnormal) Collected:  02/13/17 0639     Specimen:  Blood Updated:  02/13/17 0736     B-Natriuretic Peptide 3,150 (H) pg/mL     Magnesium [[id number]] Collected:  02/13/17 0639    Specimen:  Blood Updated:  02/13/17 0724     Magnesium 2.2 mg/dL     Hemoglobin and hematocrit, blood [[id number]]  (Abnormal) Collected:  02/13/17 0528    Specimen:  Blood Updated:  02/13/17 0539     Hgb 8.7 (L) g/dL      Hematocrit 64.4 (L) %     Comprehensive metabolic panel [[id number]]  (Abnormal) Collected:  02/13/17 0157    Specimen:  Blood Updated:  02/13/17 0249     Glucose 103 (H) mg/dL      BUN 63.8 (H) mg/dL      Creatinine 1.4 (H) mg/dL      Sodium 756 mEq/L      Potassium 4.6 mEq/L      Chloride 110 mEq/L      CO2 16 (L) mEq/L      Calcium 8.5 mg/dL      Protein, Total 5.6 (L) g/dL      Albumin 3.0 (L) g/dL      AST (SGOT) 15 U/L      ALT 12 U/L      Alkaline Phosphatase 73 U/L      Bilirubin, Total 0.5 mg/dL      Globulin 2.6 g/dL      Albumin/Globulin Ratio 1.2    GFR [[id number]] Collected:  02/13/17 0157     Updated:  02/13/17 0249     EGFR 47.8    CBC and differential [[id number]]  (Abnormal) Collected:  02/13/17 0157    Specimen:  Blood from Blood Updated:  02/13/17 6010  WBC 11.20 (H) x10 3/uL      Hgb 8.5 (L) g/dL      Hematocrit 09.6 (L) %      Platelets 242 x10 3/uL      RBC 3.25 (L) x10 6/uL      MCV 88.9 fL      MCH 26.2 (L) pg      MCHC 29.4 (L) g/dL      RDW 15 %      MPV 10.4 fL      Neutrophils 81.9 %      Lymphocytes Automated 3.3 %      Monocytes 11.1 %      Eosinophils Automated 2.9 %      Basophils Automated 0.3 %      Immature Granulocyte 0.5 %      Nucleated RBC 0.0 /100 WBC      Neutrophils Absolute 9.17 (H) x10 3/uL      Abs Lymph Automated 0.37 (L) x10 3/uL      Abs Mono Automated 1.24 (H) x10 3/uL      Abs Eos Automated 0.33 x10 3/uL      Absolute Baso Automated 0.03 x10 3/uL      Absolute Immature Granulocyte 0.06 (H) x10 3/uL      Absolute NRBC 0.00 x10 3/uL     Prepare RBC: one unit [[id number]] Collected:  02/09/17 1440     Updated:   02/13/17 0044     RBC Leukoreduced RBC Leukoreduced     BLUNIT [id number]     Status transfused     PRODUCT CODE (NON READABLE) E4533V00     Expiration Date [id number]     UTYPE O POS    Prepare RBC: one unit [[id number]] Collected:  02/09/17 1440     Updated:  02/13/17 0044     RBC Leukoreduced RBC Leukoreduced     BLUNIT [id number]     Status transfused     PRODUCT CODE (NON READABLE) E4532V00     Expiration Date [id number]     UTYPE O POS    Hemoglobin and hematocrit, blood [[id number]]  (Abnormal) Collected:  02/12/17 2218    Specimen:  Blood Updated:  02/12/17 2242     Hgb 8.5 (L) g/dL      Hematocrit 84.1 (L) %     Hemoglobin and hematocrit, blood [[id number]]  (Abnormal) Collected:  02/12/17 1432    Specimen:  Blood Updated:  02/12/17 1446     Hgb 8.4 (L) g/dL      Hematocrit 10.9 (L) %               Rads:   Radiological Procedure reviewed.    Signed by: Schuyler Amor MD  [id number]

## 2017-02-13 NOTE — Progress Notes (Addendum)
[facility]   Xzavion Doswell M. Lucianne Muss, MD, [facility]  PROGRESS NOTE  Office: [phone]  Cell phone [phone]                                       Date Time: 02/13/17 10:48 AM  Patient Name: [name]      Assessment:    Ventricular tachycardia, non-sustained, recorded on tele on 02/10/17 at 12:07- asymptomatic and hemodynamically stable.    Atrial fibrillation, new onset, with rapid ventricular response   CHF with reduced EF of 36%   Severe pulmonary hypertension with an estimated PASP of 63 mmHg by echo   GI bleeding-    Anemia   CKD stage III   Dyslipidemia   Plan:    Hold off on anticoagulation for AF as he may be having active GI bleeding.   Coreg 3.125 mg bid   Increase Lisinopril to 5 mg bid   Lasix 40 mg IV daily   Will need left and right heart cath before discharge. I discussed the procedure with him. He is not interested at this point.  Recent Cardiac Work up:   Echo 02/11/17:   The left ventricle is normal in size.    * Left ventricular systolic function is moderately decreased with an  ejection fraction by Biplane Method of Discs of  36 %.    * Left Ventricular diastolic filling parameters are consistent with Grade II  diastolic dysfunction (pseudonormal pattern).    * Right ventricular size and systolic function are normal.    * Mild bi-atrial enlargement.    * Moderate to severe mitral valve regurgitation.    * There is mild tricuspid regurgitation.    * Moderate pulmonary hypertension with estimated right ventricular systolic  pressure of  63.29 mmHg.  EKG 02/09/17:   AF with RVR at 130 bpm  NS STT abnormalities  BNP 02/10/17:  2921  Subjective:   He has dyspnea with minimal exertion. Denies chest pain.    Medications:     Current Facility-Administered Medications   Medication Dose Route Frequency   . carvedilol  3.125 mg Oral Q12H SCH   . [START ON 02/14/2017] furosemide  40 mg Intravenous Daily   . lisinopril  2.5 mg Oral BID   . pantoprazole  40 mg Oral BID          Physical Exam:     Vitals:    02/13/17 0955   BP: 119/73   Pulse:    Resp: 22   Temp: 97.9 F (36.6 C)   SpO2: 96%       Intake and Output Summary (Last 24 hours) at Date Time    Intake/Output Summary (Last 24 hours) at 02/13/17 1048  Last data filed at 02/13/17 0949   Gross per 24 hour   Intake                0 ml   Output             1300 ml   Net            -1300 ml       General appearance - in no distress   Head: NC/AT, mm moist  Neck - supple, Carotids 2+, no bruit, no JVD   Chest - clear to auscultation, no wheezes, rales or rhonchi, symmetric air entry   Heart -  normal rate and regular rhythm, S1 and S2 normal, no murmurs   Abdomen - soft, nontender, nondistended, no masses or organomegaly   Extremities - peripheral pulses normal, no pedal edema, no clubbing or cyanosis        Labs:   Recent CBC   Recent Labs      02/13/17   0528  02/13/17   0157   RBC   --   3.25*   Hgb  8.7*  8.5*   Hematocrit  29.6*  28.9*   MCV   --   88.9   MCH   --   26.2*   MCHC   --   29.4*   RDW   --   15   MPV   --   10.4   Platelets   --   242     Recent BMP   Recent Labs      02/13/17   0157   Glucose  103*   BUN  33.0*   Creatinine  1.4*   Calcium  8.5   Sodium  137   Potassium  4.6   Chloride  110   CO2  16*     Recent CMP   Recent Labs      02/13/17   0157   Glucose  103*   BUN  33.0*   Creatinine  1.4*   Sodium  137   CO2  16*   Calcium  8.5   Albumin  3.0*   AST (SGOT)  15   ALT  12   Globulin  2.6     Recent CARDIAC ENZYMES No results for input(s): TROPONIN, ISTATTROPONI, CK, CKMB in the last 24 hours.    Invalid input(s): TROPONINT  Recent TSH No results for input(s): TSH in the last 24 hours.  Recent URIC ACID Invalid input(s): URIC  Recent BLOOD CULTURE No results for input(s): CXBLD in the last 24 hours.  Recent URINE CULTURE Invalid input(s): CXURN  Recent OCCULT BLOOD Invalid input(s): OCCBL  Recent PT/PTT No results for input(s): INR, PTT in the last 24 hours.    Invalid input(s): PTI, COUM, COUMP, ACOAG,  ACOAP  Recent PT/INR No results for input(s): INR in the last 24 hours.    Invalid input(s): PTI, COUM, COUMP  Recent LIPID PANEL No results for input(s): CHOL, TRIG, HDL in the last 24 hours.    Invalid input(s): LDLC, VLDLC, LRAT  Recent ABG No results for input(s): TEMP, FIO2, RATE, MODE, ETCO2, PEEP in the last 24 hours.    Invalid input(s): APH, APCO2, APO2, AHCO3, ATCO2, ABE, AOSAT, ABGS, ALLEN, STATS, O2DEL, O2FLO, PRESS, VNTMN, PRSUP, TIVOL      Rads:   Radiological Procedure reviewed.    Signed by: Valerie Roys, MD, [facility]

## 2017-02-13 NOTE — Plan of Care (Signed)
Problem: Safety  Goal: Patient will be free from injury during hospitalization  Outcome: Progressing   02/13/17 1150   Goal/Interventions addressed this shift   Patient will be free from injury during hospitalization  Assess patient's risk for falls and implement fall prevention plan of care per policy;Provide and maintain safe environment;Use appropriate transfer methods;Ensure appropriate safety devices are available at the bedside;Hourly rounding     Goal: Patient will be free from infection during hospitalization  Outcome: Progressing   02/13/17 1150   Goal/Interventions addressed this shift   Free from Infection during hospitalization Assess and monitor for signs and symptoms of infection;Monitor lab/diagnostic results       Problem: Psychosocial and Spiritual Needs  Goal: Demonstrates ability to cope with hospitalization/illness  Outcome: Completed Date Met: 02/13/17      Problem: Moderate/High Fall Risk Score >5  Goal: Patient will remain free of falls  Outcome: Progressing   02/13/17 0800   OTHER   Moderate Risk (6-13) MOD-Initiate Yellow "Fall Risk" magnet communication tool;LOW-Fall Interventions Appropriate for Low Fall Risk       Problem: Altered GI Function  Goal: Fluid and electrolyte balance are achieved/maintained  Outcome: Completed Date Met: 02/13/17    Goal: Elimination patterns are normal or improving  Outcome: Completed Date Met: 02/13/17    Goal: No bleeding  Outcome: Progressing   02/13/17 1150   Goal/Interventions addressed this shift   No bleeding  Monitor and assess vitals and hemodynamic parameters;Monitor/assess lab values and report abnormal values;Assess for bruising/petechia       Problem: Hemodynamic Status: Cardiac  Goal: Stable vital signs and fluid balance  Outcome: Progressing   02/13/17 1150   Goal/Interventions addressed this shift   Stable vital signs and fluid balance Monitor/assess vital signs and telemetry per unit protocol;Weigh on admission and record weight daily;Assess  signs and symptoms associated with cardiac rhythm changes;Monitor intake/output per unit protocol and/or LIP order;Monitor lab values;Monitor for leg swelling/edema and report to LIP if abnormal       Problem: Inadequate Tissue Perfusion  Goal: Adequate tissue perfusion will be maintained  Outcome: Progressing   02/13/17 1150   Goal/Interventions addressed this shift   Adequate tissue perfusion will be maintained Monitor/assess vital signs;Monitor/assess lab values and report abnormal values;Monitor/assess neurovascular status (pulses, capillary refill, pain, paresthesia, paralysis, presence of edema);Monitor/assess for signs of VTE (edema of calf/thigh redness, pain);Monitor intake and output;Monitor for signs and symptoms of a pulmonary embolism (dyspnea, tachypnea, tachycardia, confusion)       Problem: Ineffective Gas Exchange  Goal: Effective breathing pattern  Outcome: Progressing   02/13/17 1150   Goal/Interventions addressed this shift   Effective breathing pattern Maintain CO2 level per LIP order;Maintain O2 saturation level per LIP order       Problem: Impaired Mobility  Goal: Mobility/Activity is maintained at optimal level for patient  Outcome: Progressing   02/13/17 1150   Goal/Interventions addressed this shift   Mobility/activity is maintained at optimal level for patient Increase mobility as tolerated/progressive mobility;Encourage independent activity per ability;Plan activities to conserve energy, plan rest periods;Assess for changes in respiratory status, level of consciousness and/or development of fatigue

## 2017-02-13 NOTE — Plan of Care (Signed)
Problem: Safety  Goal: Patient will be free from injury during hospitalization  Outcome: Progressing   02/13/17 0049   Goal/Interventions addressed this shift   Patient will be free from injury during hospitalization  Assess patient's risk for falls and implement fall prevention plan of care per policy;Provide and maintain safe environment;Use appropriate transfer methods;Ensure appropriate safety devices are available at the bedside;Include patient/ family/ care giver in decisions related to safety;Hourly rounding;Assess for patients risk for elopement and implement Elopement Risk Plan per policy;Provide alternative method of communication if needed (communication boards, writing)     Goal: Patient will be free from infection during hospitalization  Outcome: Progressing   02/13/17 0049   Goal/Interventions addressed this shift   Free from Infection during hospitalization Assess and monitor for signs and symptoms of infection;Monitor lab/diagnostic results       Problem: Psychosocial and Spiritual Needs  Goal: Demonstrates ability to cope with hospitalization/illness  Outcome: Progressing      Problem: Moderate/High Fall Risk Score >5  Goal: Patient will remain free of falls   02/13/17 0049   OTHER   Moderate Risk (6-13) LOW-Fall Interventions Appropriate for Low Fall Risk;LOW-Anticoagulation education for injury risk       Problem: Altered GI Function  Goal: Fluid and electrolyte balance are achieved/maintained  Outcome: Progressing   02/13/17 0049   Goal/Interventions addressed this shift   Fluid and electrolyte balance are achieved/maintained Monitor intake and output every shift;Monitor/assess lab values and report abnormal values;Provide adequate hydration;Assess for confusion/personality changes;Assess and reassess fluid and electrolyte status;Observe for seizure activity and initiate seizure precautions if indicated;Observe for cardiac arrhythmias;Monitor for muscle weakness     Goal: Elimination patterns are  normal or improving  Outcome: Progressing    Goal: No bleeding  Outcome: Progressing   02/13/17 0049   Goal/Interventions addressed this shift   No bleeding  Monitor/assess lab values and report abnormal values;Monitor and assess vitals and hemodynamic parameters

## 2017-02-13 NOTE — Progress Notes (Addendum)
Medicine Shift Note    Patient Lines/Drains/Airways Status    Active Lines, Drains and Airways     Name:   Placement date:   Placement time:   Site:   Days:    Peripheral IV 02/09/17 Left Hand  02/09/17    1805    Hand    3                Last BM: 02/11/2017    Pending Orders: H&H Q8 hours, BNP    Discharge Plan: TBD    POC: Heart monitoring, daily weight, strict I&Os, daily lasix, and BNP    Skin: Intact    Tele: Yes, NSR, with occasional Afib, and PSVT    Activity: OOB with standby    Interpreter Needs: No    Shift Note:     Pt A+Ox4, vitals stable, afebrile, on 2L NC. Pt had dyspnea with minor exertion this morning, CXR, BNP ordered. BNP elevated this morning, need another one tomorrow. Pt also had a dose of IV lasix. Pt a lot better over the day while still has mild dyspnea when OOB. Pt denies chest pain, no dizziness. Pt is in and out of afib, heart rate stable with PSVT x2.  Pt on H&H labs BID.     Daughter at bedside this afternoon, gave her updates, answered all her questions, and encouraged to come in the morning so she can talk to cardiologist why her dad needs to have cardiac cath. Dr York Grice also informed of daughter's quest to be informed of cardiac plan.     Safety needs and fall precautions discussed with pt, pt verbalized understanding. Call light and bedside table within pt reach, bed at lowest position, non-skid socks on, bedside rails up, and hourly rounding maintained.

## 2017-02-13 NOTE — Progress Notes (Addendum)
The pt sleeping well this shift.  6 beats v-tach.Dr York Grice paged. Later pt had labored breathing. Seen by rapid nurse. Dr York Grice paged as well as hospitalist.  New orders received. Lasix and labs completed. CXR to be done. Respiratory  therapist gave neb tx. Pt states feeling better. Report to on coming shift.  Medicine Shift Note    Patient Lines/Drains/Airways Status    Active Lines, Drains and Airways     Name:   Placement date:   Placement time:   Site:   Days:    Peripheral IV 02/09/17 Left Hand  02/09/17    1805    Hand    3                Last BM: 02/11/17    Pending Orders: CXR    Discharge Plan: TBD    UJW:[id number] and labs as per order    Skin: No changes.    Tele:Telemetry monitor in place.     Activity OOB with assist.    Interpreter Needs:No Used:No Method: NA Waiver Need/present:NA    Shift Note:See above

## 2017-02-14 ENCOUNTER — Encounter: Payer: Self-pay | Admitting: Gastroenterology

## 2017-02-14 LAB — CBC AND DIFFERENTIAL
Absolute NRBC: 0 10*3/uL
Basophils Absolute Automated: 0.03 10*3/uL (ref 0.00–0.20)
Basophils Automated: 0.3 %
Eosinophils Absolute Automated: 0.35 10*3/uL (ref 0.00–0.70)
Eosinophils Automated: 4.1 %
Hematocrit: 27.3 % — ABNORMAL LOW (ref 42.0–52.0)
Hgb: 8 g/dL — ABNORMAL LOW (ref 13.0–17.0)
Immature Granulocytes Absolute: 0.03 10*3/uL
Immature Granulocytes: 0.3 %
Lymphocytes Absolute Automated: 0.33 10*3/uL — ABNORMAL LOW (ref 0.50–4.40)
Lymphocytes Automated: 3.8 %
MCH: 25.9 pg — ABNORMAL LOW (ref 28.0–32.0)
MCHC: 29.3 g/dL — ABNORMAL LOW (ref 32.0–36.0)
MCV: 88.3 fL (ref 80.0–100.0)
MPV: 10.5 fL (ref 9.4–12.3)
Monocytes Absolute Automated: 1.03 10*3/uL (ref 0.00–1.20)
Monocytes: 12 %
Neutrophils Absolute: 6.83 10*3/uL (ref 1.80–8.10)
Neutrophils: 79.5 %
Nucleated RBC: 0 /100 WBC (ref 0.0–1.0)
Platelets: 223 10*3/uL (ref 140–400)
RBC: 3.09 10*6/uL — ABNORMAL LOW (ref 4.70–6.00)
RDW: 15 % (ref 12–15)
WBC: 8.6 10*3/uL (ref 3.50–10.80)

## 2017-02-14 LAB — COMPREHENSIVE METABOLIC PANEL
ALT: 11 U/L (ref 0–55)
AST (SGOT): 14 U/L (ref 5–34)
Albumin/Globulin Ratio: 1.2 (ref 0.9–2.2)
Albumin: 2.7 g/dL — ABNORMAL LOW (ref 3.5–5.0)
Alkaline Phosphatase: 65 U/L (ref 38–106)
BUN: 37 mg/dL — ABNORMAL HIGH (ref 9.0–28.0)
Bilirubin, Total: 0.5 mg/dL (ref 0.2–1.2)
CO2: 21 mEq/L — ABNORMAL LOW (ref 22–29)
Calcium: 8.2 mg/dL (ref 7.9–10.2)
Chloride: 108 mEq/L (ref 100–111)
Creatinine: 1.5 mg/dL — ABNORMAL HIGH (ref 0.7–1.3)
Globulin: 2.3 g/dL (ref 2.0–3.6)
Glucose: 96 mg/dL (ref 70–100)
Potassium: 3.8 mEq/L (ref 3.5–5.1)
Protein, Total: 5 g/dL — ABNORMAL LOW (ref 6.0–8.3)
Sodium: 137 mEq/L (ref 136–145)

## 2017-02-14 LAB — B-TYPE NATRIURETIC PEPTIDE: B-Natriuretic Peptide: 3104 pg/mL — ABNORMAL HIGH (ref 0–100)

## 2017-02-14 LAB — GFR: EGFR: 44.2

## 2017-02-14 LAB — HEMOGLOBIN AND HEMATOCRIT, BLOOD
Hematocrit: 29.5 % — ABNORMAL LOW (ref 42.0–52.0)
Hgb: 8.7 g/dL — ABNORMAL LOW (ref 13.0–17.0)

## 2017-02-14 NOTE — Plan of Care (Signed)
Problem: Safety  Goal: Patient will be free from injury during hospitalization  Outcome: Progressing   02/13/17 1150   Goal/Interventions addressed this shift   Patient will be free from injury during hospitalization  Assess patient's risk for falls and implement fall prevention plan of care per policy;Provide and maintain safe environment;Use appropriate transfer methods;Ensure appropriate safety devices are available at the bedside;Hourly rounding     Goal: Patient will be free from infection during hospitalization  Outcome: Progressing   02/13/17 1150   Goal/Interventions addressed this shift   Free from Infection during hospitalization Assess and monitor for signs and symptoms of infection;Monitor lab/diagnostic results       Problem: Moderate/High Fall Risk Score >5  Goal: Patient will remain free of falls  Outcome: Progressing   02/13/17 2015   OTHER   Moderate Risk (6-13) MOD-Remain with patient during toileting;MOD-Place Fall Risk level on whiteboard in room;MOD-include family in multidisciplinary POC discussions;MOD-Request PT/OT consult order for patients with gait/mobility impairment;MOD-Re-orient confused patients;MOD-Floor mat at bedside (where available) if appropriate       Problem: Altered GI Function  Goal: No bleeding  Outcome: Progressing   02/13/17 1150   Goal/Interventions addressed this shift   No bleeding  Monitor and assess vitals and hemodynamic parameters;Monitor/assess lab values and report abnormal values;Assess for bruising/petechia       Problem: Hemodynamic Status: Cardiac  Goal: Stable vital signs and fluid balance  Outcome: Progressing   02/13/17 1150   Goal/Interventions addressed this shift   Stable vital signs and fluid balance Monitor/assess vital signs and telemetry per unit protocol;Weigh on admission and record weight daily;Assess signs and symptoms associated with cardiac rhythm changes;Monitor intake/output per unit protocol and/or LIP order;Monitor lab values;Monitor for  leg swelling/edema and report to LIP if abnormal       Problem: Inadequate Tissue Perfusion  Goal: Adequate tissue perfusion will be maintained  Outcome: Progressing   02/13/17 1150   Goal/Interventions addressed this shift   Adequate tissue perfusion will be maintained Monitor/assess vital signs;Monitor/assess lab values and report abnormal values;Monitor/assess neurovascular status (pulses, capillary refill, pain, paresthesia, paralysis, presence of edema);Monitor/assess for signs of VTE (edema of calf/thigh redness, pain);Monitor intake and output;Monitor for signs and symptoms of a pulmonary embolism (dyspnea, tachypnea, tachycardia, confusion)       Problem: Ineffective Gas Exchange  Goal: Effective breathing pattern  Outcome: Progressing   02/13/17 1150   Goal/Interventions addressed this shift   Effective breathing pattern Maintain CO2 level per LIP order;Maintain O2 saturation level per LIP order       Problem: Impaired Mobility  Goal: Mobility/Activity is maintained at optimal level for patient  Outcome: Progressing   02/13/17 1150   Goal/Interventions addressed this shift   Mobility/activity is maintained at optimal level for patient Increase mobility as tolerated/progressive mobility;Encourage independent activity per ability;Plan activities to conserve energy, plan rest periods;Assess for changes in respiratory status, level of consciousness and/or development of fatigue

## 2017-02-14 NOTE — Progress Notes (Signed)
[facility]   Adalena Abdulla M. Lucianne Muss, MD, [facility]  PROGRESS NOTE  Office: [phone]  Cell phone [phone]                                       Date Time: 02/14/17 8:04 AM  Patient Name: Mccann,Deforest E      Assessment:    Ventricular tachycardia, non-sustained, recorded on tele on 02/10/17 at 12:07- asymptomatic and hemodynamically stable.    Atrial fibrillation, new onset, with rapid ventricular response   CHF with reduced EF of 36%   Severe pulmonary hypertension with an estimated PASP of 63 mmHg by echo   GI bleeding-    Anemia   CKD stage III   Dyslipidemia   Plan:    Hold off on anticoagulation for AF as he may be having active GI bleeding.   Coreg 3.125 mg bid   Lisinopril 5 mg bid   Lasix 40 mg IV daily   Will need left and right heart cath before discharge. I discussed the procedure with him and his daughter in detail and reviewed all the risks and benefits and answered all their questions. He would like to proceed with the procedure. Will schedule it.   Recent Cardiac Work up:   Echo 02/11/17:   The left ventricle is normal in size.    * Left ventricular systolic function is moderately decreased with an  ejection fraction by Biplane Method of Discs of  36 %.    * Left Ventricular diastolic filling parameters are consistent with Grade II  diastolic dysfunction (pseudonormal pattern).    * Right ventricular size and systolic function are normal.    * Mild bi-atrial enlargement.    * Moderate to severe mitral valve regurgitation.    * There is mild tricuspid regurgitation.    * Moderate pulmonary hypertension with estimated right ventricular systolic  pressure of  63.29 mmHg.  EKG 02/09/17:   AF with RVR at 130 bpm  NS STT abnormalities  BNP 02/10/17:  2921  Subjective:   He has dyspnea with minimal exertion. Denies chest pain.    Medications:     Current Facility-Administered Medications   Medication Dose Route Frequency   . carvedilol  3.125 mg Oral Q12H SCH   . furosemide  40 mg  Intravenous Daily   . lisinopril  5 mg Oral BID   . pantoprazole  40 mg Oral BID         Physical Exam:     Vitals:    02/14/17 0328   BP: 115/75   Pulse: 78   Resp: 18   Temp: 98.2 F (36.8 C)   SpO2: 98%       Intake and Output Summary (Last 24 hours) at Date Time    Intake/Output Summary (Last 24 hours) at 02/14/17 0804  Last data filed at 02/14/17 0600   Gross per 24 hour   Intake              350 ml   Output             2375 ml   Net            -2025 ml       General appearance - in no distress   Head: NC/AT, mm moist  Neck - supple, Carotids 2+, no bruit, no JVD   Chest -  clear to auscultation, no wheezes, rales or rhonchi, symmetric air entry   Heart - normal rate and regular rhythm, S1 and S2 normal, no murmurs   Abdomen - soft, nontender, nondistended, no masses or organomegaly   Extremities - peripheral pulses normal, no pedal edema, no clubbing or cyanosis        Labs:   Recent CBC   Recent Labs      02/14/17   0559   RBC  3.09*   Hgb  8.0*   Hematocrit  27.3*   MCV  88.3   MCH  25.9*   MCHC  29.3*   RDW  15   MPV  10.5   Platelets  223     Recent BMP   Recent Labs      02/14/17   0559   Glucose  96   BUN  37.0*   Creatinine  1.5*   Calcium  8.2   Sodium  137   Potassium  3.8   Chloride  108   CO2  21*     Recent CMP   Recent Labs      02/14/17   0559   Glucose  96   BUN  37.0*   Creatinine  1.5*   Sodium  137   CO2  21*   Calcium  8.2   Albumin  2.7*   AST (SGOT)  14   ALT  11   Globulin  2.3     Recent CARDIAC ENZYMES No results for input(s): TROPONIN, ISTATTROPONI, CK, CKMB in the last 24 hours.    Invalid input(s): TROPONINT  Recent TSH No results for input(s): TSH in the last 24 hours.  Recent URIC ACID Invalid input(s): URIC  Recent BLOOD CULTURE No results for input(s): CXBLD in the last 24 hours.  Recent URINE CULTURE Invalid input(s): CXURN  Recent OCCULT BLOOD Invalid input(s): OCCBL  Recent PT/PTT No results for input(s): INR, PTT in the last 24 hours.    Invalid input(s): PTI, COUM, COUMP,  ACOAG, ACOAP  Recent PT/INR No results for input(s): INR in the last 24 hours.    Invalid input(s): PTI, COUM, COUMP  Recent LIPID PANEL No results for input(s): CHOL, TRIG, HDL in the last 24 hours.    Invalid input(s): LDLC, VLDLC, LRAT  Recent ABG No results for input(s): TEMP, FIO2, RATE, MODE, ETCO2, PEEP in the last 24 hours.    Invalid input(s): APH, APCO2, APO2, AHCO3, ATCO2, ABE, AOSAT, ABGS, ALLEN, STATS, O2DEL, O2FLO, PRESS, VNTMN, PRSUP, TIVOL      Rads:   Radiological Procedure reviewed.    Signed by: Valerie Roys, MD, [facility]

## 2017-02-14 NOTE — Progress Notes (Signed)
PROGRESS NOTE    Date Time: 02/14/17 11:22 AM  Patient Name: [name]  Attending Physician: Schuyler Amor, MD    Assessment:   Symptomatic anemia   Tachycardia   Hypotension   Chronic ulcerative colitis   AKI multifactorial   New onset atrial fibrillation   Acute on chronic systolic HFrEF  Plan:   S/p colonoscopy and EGD c/w AVM  Hold AC   Echo c/w low EF 36% systolic HF acute on chronic   LHC/RHC by cardiology family and patient agreed to proceed for cath   Lasix 40 mg iv daily   Continue PPI   SCD for DVT prophylaxis   Case d/w patient and staff care coordinated   History of Present Illness:   [name] is a 83 y.o. male who presents to the hospital with abnormal labs with severe anemia in ED he was hypotensive seen by [facility] for evaluation of admission to ICU however his repeat BP showed improvement and he is  admitted to [facility]   He c/o feeling weak and dizziness   Denies any abdominal pain , nausea vomiting   No hematemesis melena or hematochezia  No hemoptysis, cough or fever   No cp or SOB   Rest of ROS  negative    Patient was seen by me in the ED last night however he became hypotensive and tachycardiac and [facility] was consulted for possible ICU admission   Past Medical History:     Past Medical History:   Diagnosis Date   . Anemia     iron low   . DOE (dyspnea on exertion)     mild   . Glaucoma NEC    . Hypertensive disorder    . Skin cancer     2006 removed mole   . Ulcerative colitis, chronic    . Vision abnormalities        Past Surgical History:     Past Surgical History:   Procedure Laterality Date   . EGD, COLONOSCOPY  04/03/2012    Procedure: EGD, COLONOSCOPY;  Surgeon: Lestine Mount, MD;  Location: [facility];  Service: Gastroenterology;  Laterality: N/A;   . EYE SURGERY      cataract       Family History:   History reviewed. No pertinent family history.    Social History:     Social History     Social History   . Marital status: Married     Spouse name: N/A   . Number of children: N/A   . Years  of education: N/A     Social History Main Topics   . Smoking status: Former Smoker     Packs/day: 1.00     Years: 12.00     Quit date: 12/18/1961   . Smokeless tobacco: Never Used   . Alcohol use 0.0 oz/week      Comment: 0.5oz/d   . Drug use: No   . Sexual activity: Not on file     Other Topics Concern   . Not on file     Social History Narrative   . No narrative on file       Allergies:   No Known Allergies    Medications:     Prescriptions Prior to Admission   Medication Sig   . IRON PO Take 200 mg by mouth daily.   Marland Kitchen lisinopril (PRINIVIL,ZESTRIL) 5 MG tablet Take by mouth daily.       Review of Systems:  A comprehensive review of systems was: per HPI     Physical Exam:     Vitals:    02/14/17 1046   BP: 102/66   Pulse:    Resp:    Temp:    SpO2:        Intake and Output Summary (Last 24 hours) at Date Time    Intake/Output Summary (Last 24 hours) at 02/14/17 1122  Last data filed at 02/14/17 0600   Gross per 24 hour   Intake              350 ml   Output             1625 ml   Net            -1275 ml       General appearance - alert, well appearing, and in no distress  Mouth - mucous membranes moist, pharynx normal without lesions  Neck - supple, no significant adenopathy  Lymphatics - no palpable lymphadenopathy, no hepatosplenomegaly  Chest - clear to auscultation, no wheezes, rales or rhonchi, symmetric air entry  Heart - S1 and S2 normal, no murmurs noted, no gallops noted, no JVD, tachycardic   Abdomen - soft, nontender, nondistended, no masses or organomegaly  Neurological - alert, oriented, normal speech, no focal findings or movement disorder noted  Musculoskeletal - no joint tenderness, deformity or swelling  Extremities - peripheral pulses normal, no pedal edema, no clubbing or cyanosis  Skin - normal coloration and turgor, no rashes, no suspicious skin lesions noted         Labs:     Results     Procedure Component Value Units Date/Time    B-type Natriuretic Peptide [[id number]]  (Abnormal) Collected:   02/14/17 0559    Specimen:  Blood Updated:  02/14/17 0651     B-Natriuretic Peptide 3,104 (H) pg/mL     Comprehensive metabolic panel [[id number]]  (Abnormal) Collected:  02/14/17 0559    Specimen:  Blood Updated:  02/14/17 0646     Glucose 96 mg/dL      BUN 69.6 (H) mg/dL      Creatinine 1.5 (H) mg/dL      Sodium 295 mEq/L      Potassium 3.8 mEq/L      Chloride 108 mEq/L      CO2 21 (L) mEq/L      Calcium 8.2 mg/dL      Protein, Total 5.0 (L) g/dL      Albumin 2.7 (L) g/dL      AST (SGOT) 14 U/L      ALT 11 U/L      Alkaline Phosphatase 65 U/L      Bilirubin, Total 0.5 mg/dL      Globulin 2.3 g/dL      Albumin/Globulin Ratio 1.2    GFR [[id number]] Collected:  02/14/17 0559     Updated:  02/14/17 0646     EGFR 44.2    CBC and differential [[id number]]  (Abnormal) Collected:  02/14/17 0559    Specimen:  Blood from Blood Updated:  02/14/17 0630     WBC 8.60 x10 3/uL      Hgb 8.0 (L) g/dL      Hematocrit 44.0 (L) %      Platelets 223 x10 3/uL      RBC 3.09 (L) x10 6/uL      MCV 88.3 fL      MCH 25.9 (L) pg      MCHC 29.3 (  L) g/dL      RDW 15 %      MPV 10.5 fL      Neutrophils 79.5 %      Lymphocytes Automated 3.8 %      Monocytes 12.0 %      Eosinophils Automated 4.1 %      Basophils Automated 0.3 %      Immature Granulocyte 0.3 %      Nucleated RBC 0.0 /100 WBC      Neutrophils Absolute 6.83 x10 3/uL      Abs Lymph Automated 0.33 (L) x10 3/uL      Abs Mono Automated 1.03 x10 3/uL      Abs Eos Automated 0.35 x10 3/uL      Absolute Baso Automated 0.03 x10 3/uL      Absolute Immature Granulocyte 0.03 x10 3/uL      Absolute NRBC 0.00 x10 3/uL     Hemoglobin and hematocrit, blood [[id number]]  (Abnormal) Collected:  02/13/17 1806    Specimen:  Blood Updated:  02/13/17 1824     Hgb 9.4 (L) g/dL      Hematocrit 40.9 (L) %               Rads:   Radiological Procedure reviewed.    Signed by: Schuyler Amor MD  [id number]

## 2017-02-14 NOTE — Progress Notes (Signed)
Medicine Shift Note    Patient Lines/Drains/Airways Status    Active Lines, Drains and Airways     Name:   Placement date:   Placement time:   Site:   Days:    Peripheral IV 02/09/17 Left Hand  02/09/17    1805    Hand    4    Peripheral IV 02/12/17 Right Wrist  02/12/17    1500    Wrist    1                Last BM: today    Pending Orders: n/a    Discharge Plan: to be determine    POC: daughter updated    Skin: intact    Tele: yes extensive cardiac history    Activity: walk, stand by assist    Interpreter Needs: n/a, Used: n/a, Method: n/a, Waiver Need/present: n/a     Shift Note: Patient daughter updated on plan of care by writer according to the note.

## 2017-02-14 NOTE — Plan of Care (Signed)
Problem: Safety  Goal: Patient will be free from injury during hospitalization  Outcome: Progressing   02/14/17 0840   Goal/Interventions addressed this shift   Patient will be free from injury during hospitalization  Assess patient's risk for falls and implement fall prevention plan of care per policy;Provide and maintain safe environment;Use appropriate transfer methods;Ensure appropriate safety devices are available at the bedside;Include patient/ family/ care giver in decisions related to safety;Hourly rounding     Goal: Patient will be free from infection during hospitalization  Outcome: Progressing   02/14/17 0840   Goal/Interventions addressed this shift   Free from Infection during hospitalization Assess and monitor for signs and symptoms of infection;Monitor lab/diagnostic results       Problem: Altered GI Function  Goal: No bleeding  Outcome: Progressing   02/14/17 0840   Goal/Interventions addressed this shift   No bleeding  Monitor and assess vitals and hemodynamic parameters       Problem: Hemodynamic Status: Cardiac  Goal: Stable vital signs and fluid balance  Outcome: Progressing   02/14/17 0840   Goal/Interventions addressed this shift   Stable vital signs and fluid balance Monitor/assess vital signs and telemetry per unit protocol       Problem: Inadequate Tissue Perfusion  Goal: Adequate tissue perfusion will be maintained  Outcome: Progressing   02/14/17 0840   Goal/Interventions addressed this shift   Adequate tissue perfusion will be maintained Monitor/assess lab values and report abnormal values;Monitor/assess vital signs;Monitor/assess neurovascular status (pulses, capillary refill, pain, paresthesia, paralysis, presence of edema);Monitor for signs and symptoms of a pulmonary embolism (dyspnea, tachypnea, tachycardia, confusion);Encourage/assist patient as needed to turn, cough, and perform deep breathing every 2 hours       Problem: Ineffective Gas Exchange  Goal: Effective breathing  pattern  Outcome: Progressing   02/14/17 0840   Goal/Interventions addressed this shift   Effective breathing pattern Maintain O2 saturation level per LIP order       Problem: Impaired Mobility  Goal: Mobility/Activity is maintained at optimal level for patient  Outcome: Progressing   02/14/17 0840   Goal/Interventions addressed this shift   Mobility/activity is maintained at optimal level for patient Increase mobility as tolerated/progressive mobility;Maintain proper body alignment;Plan activities to conserve energy, plan rest periods;Reposition patient every 2 hours and as needed unless able to reposition self;Encourage independent activity per ability;Perform active/passive ROM;Assess for changes in respiratory status, level of consciousness and/or development of fatigue

## 2017-02-14 NOTE — Progress Notes (Signed)
Medicine Shift Note    Patient Lines/Drains/Airways Status    Active Lines, Drains and Airways     Name:   Placement date:   Placement time:   Site:   Days:    Peripheral IV 02/09/17 Left Hand  02/09/17    1805    Hand    4    Peripheral IV 02/12/17 Right Wrist  02/12/17    1500    Wrist    1                Last BM:11/23    Pending Orders: BNP, H&H Q12 hours    Discharge Plan: TBD    POC: daily weight, strict I&O, daily lasix, BNP    Skin:Intact    Tele:Yes, NSR, occasional Afib, PSVT     Activity: standby assist    Interpreter Needs:No    Shift Note: Pt A&OX4, fall precaution maintained. Asked to call for any assistance with transfers or needs. Pt verbalized  understanding of fall and safety precautions. Bed in lowest position. Call light and bedside table in reach. Pt is on 2L NC, denies pain, asleep at this time. Will continue to monitor.

## 2017-02-15 LAB — CBC AND DIFFERENTIAL
Absolute NRBC: 0 10*3/uL
Basophils Absolute Automated: 0.03 10*3/uL (ref 0.00–0.20)
Basophils Automated: 0.3 %
Eosinophils Absolute Automated: 0.41 10*3/uL (ref 0.00–0.70)
Eosinophils Automated: 4.6 %
Hematocrit: 28 % — ABNORMAL LOW (ref 42.0–52.0)
Hgb: 8.3 g/dL — ABNORMAL LOW (ref 13.0–17.0)
Immature Granulocytes Absolute: 0.04 10*3/uL
Immature Granulocytes: 0.4 %
Lymphocytes Absolute Automated: 0.42 10*3/uL — ABNORMAL LOW (ref 0.50–4.40)
Lymphocytes Automated: 4.7 %
MCH: 25.9 pg — ABNORMAL LOW (ref 28.0–32.0)
MCHC: 29.6 g/dL — ABNORMAL LOW (ref 32.0–36.0)
MCV: 87.5 fL (ref 80.0–100.0)
MPV: 10.6 fL (ref 9.4–12.3)
Monocytes Absolute Automated: 1.08 10*3/uL (ref 0.00–1.20)
Monocytes: 12.1 %
Neutrophils Absolute: 6.95 10*3/uL (ref 1.80–8.10)
Neutrophils: 77.9 %
Nucleated RBC: 0 /100 WBC (ref 0.0–1.0)
Platelets: 222 10*3/uL (ref 140–400)
RBC: 3.2 10*6/uL — ABNORMAL LOW (ref 4.70–6.00)
RDW: 15 % (ref 12–15)
WBC: 8.93 10*3/uL (ref 3.50–10.80)

## 2017-02-15 LAB — COMPREHENSIVE METABOLIC PANEL
ALT: 10 U/L (ref 0–55)
AST (SGOT): 10 U/L (ref 5–34)
Albumin/Globulin Ratio: 1.1 (ref 0.9–2.2)
Albumin: 2.7 g/dL — ABNORMAL LOW (ref 3.5–5.0)
Alkaline Phosphatase: 66 U/L (ref 38–106)
BUN: 38 mg/dL — ABNORMAL HIGH (ref 9.0–28.0)
Bilirubin, Total: 0.4 mg/dL (ref 0.2–1.2)
CO2: 24 mEq/L (ref 22–29)
Calcium: 8.1 mg/dL (ref 7.9–10.2)
Chloride: 108 mEq/L (ref 100–111)
Creatinine: 1.6 mg/dL — ABNORMAL HIGH (ref 0.7–1.3)
Globulin: 2.4 g/dL (ref 2.0–3.6)
Glucose: 101 mg/dL — ABNORMAL HIGH (ref 70–100)
Potassium: 3.6 mEq/L (ref 3.5–5.1)
Protein, Total: 5.1 g/dL — ABNORMAL LOW (ref 6.0–8.3)
Sodium: 139 mEq/L (ref 136–145)

## 2017-02-15 LAB — GFR: EGFR: 41

## 2017-02-15 LAB — HEMOGLOBIN AND HEMATOCRIT, BLOOD
Hematocrit: 28.7 % — ABNORMAL LOW (ref 42.0–52.0)
Hgb: 8.4 g/dL — ABNORMAL LOW (ref 13.0–17.0)

## 2017-02-15 LAB — MAGNESIUM: Magnesium: 2 mg/dL (ref 1.6–2.6)

## 2017-02-15 NOTE — Progress Notes (Signed)
[facility]   [name] M. Lucianne Muss, MD, [facility]  PROGRESS NOTE  Office: [phone]  Cell phone [phone]                                       Date Time: 02/15/17 7:23 AM  Patient Name: [name],[name]      Assessment:    Ventricular tachycardia, non-sustained, recorded on tele on 02/10/17 at 12:07- asymptomatic and hemodynamically stable.    Atrial fibrillation, new onset, with rapid ventricular response   CHF with reduced EF of 36%   Severe pulmonary hypertension with an estimated PASP of 63 mmHg by echo   GI bleeding-    Anemia   CKD stage III   Dyslipidemia   Plan:    Hold off on anticoagulation for AF as he may be having active GI bleeding.   Coreg 3.125 mg bid   Lisinopril 5 mg bid   Hold Lasix 40 mg IV daily in preparation for cath   Left and right heart cath before discharge.   Recent Cardiac Work up:   Echo 02/11/17:   The left ventricle is normal in size.    * Left ventricular systolic function is moderately decreased with an  ejection fraction by Biplane Method of Discs of  36 %.    * Left Ventricular diastolic filling parameters are consistent with Grade II  diastolic dysfunction (pseudonormal pattern).    * Right ventricular size and systolic function are normal.    * Mild bi-atrial enlargement.    * Moderate to severe mitral valve regurgitation.    * There is mild tricuspid regurgitation.    * Moderate pulmonary hypertension with estimated right ventricular systolic  pressure of  63.29 mmHg.  EKG 02/09/17:   AF with RVR at 130 bpm  NS STT abnormalities  BNP 02/10/17:  2921  Subjective:   He has dyspnea with minimal exertion. Denies chest pain.    Medications:     Current Facility-Administered Medications   Medication Dose Route Frequency   . carvedilol  3.125 mg Oral Q12H SCH   . furosemide  40 mg Intravenous Daily   . lisinopril  5 mg Oral BID   . pantoprazole  40 mg Oral BID         Physical Exam:     Vitals:    02/15/17 0656   BP: 107/69   Pulse: 65   Resp: 19   Temp: 98.2 F  (36.8 C)   SpO2: 98%       Intake and Output Summary (Last 24 hours) at Date Time    Intake/Output Summary (Last 24 hours) at 02/15/17 0723  Last data filed at 02/15/17 0252   Gross per 24 hour   Intake                0 ml   Output             1750 ml   Net            -1750 ml       General appearance - in no distress   Head: NC/AT, mm moist  Neck - supple, Carotids 2+, no bruit, no JVD   Chest - clear to auscultation, no wheezes, rales or rhonchi, symmetric air entry   Heart - normal rate and regular rhythm, S1 and S2 normal, no murmurs   Abdomen -  soft, nontender, nondistended, no masses or organomegaly   Extremities - peripheral pulses normal, no pedal edema, no clubbing or cyanosis        Labs:   Recent CBC   Recent Labs      02/15/17   0341   RBC  3.20*   Hgb  8.3*   Hematocrit  28.0*   MCV  87.5   MCH  25.9*   MCHC  29.6*   RDW  15   MPV  10.6   Platelets  222     Recent BMP   Recent Labs      02/15/17   0341   Glucose  101*   BUN  38.0*   Creatinine  1.6*   Calcium  8.1   Sodium  139   Potassium  3.6   Chloride  108   CO2  24     Recent CMP   Recent Labs      02/15/17   0341   Glucose  101*   BUN  38.0*   Creatinine  1.6*   Sodium  139   CO2  24   Calcium  8.1   Albumin  2.7*   AST (SGOT)  10   ALT  10   Globulin  2.4     Recent CARDIAC ENZYMES No results for input(s): TROPONIN, ISTATTROPONI, CK, CKMB in the last 24 hours.    Invalid input(s): TROPONINT  Recent TSH No results for input(s): TSH in the last 24 hours.  Recent URIC ACID Invalid input(s): URIC  Recent BLOOD CULTURE No results for input(s): CXBLD in the last 24 hours.  Recent URINE CULTURE Invalid input(s): CXURN  Recent OCCULT BLOOD Invalid input(s): OCCBL  Recent PT/PTT No results for input(s): INR, PTT in the last 24 hours.    Invalid input(s): PTI, COUM, COUMP, ACOAG, ACOAP  Recent PT/INR No results for input(s): INR in the last 24 hours.    Invalid input(s): PTI, COUM, COUMP  Recent LIPID PANEL No results for input(s): CHOL, TRIG, HDL in the  last 24 hours.    Invalid input(s): LDLC, VLDLC, LRAT  Recent ABG No results for input(s): TEMP, FIO2, RATE, MODE, ETCO2, PEEP in the last 24 hours.    Invalid input(s): APH, APCO2, APO2, AHCO3, ATCO2, ABE, AOSAT, ABGS, ALLEN, STATS, O2DEL, O2FLO, PRESS, VNTMN, PRSUP, TIVOL      Rads:   Radiological Procedure reviewed.    Signed by: Valerie Roys, MD, [facility]

## 2017-02-15 NOTE — Plan of Care (Signed)
Problem: Safety  Goal: Patient will be free from injury during hospitalization  Outcome: Progressing   02/15/17 1950   Goal/Interventions addressed this shift   Patient will be free from injury during hospitalization  Assess patient's risk for falls and implement fall prevention plan of care per policy;Provide and maintain safe environment;Use appropriate transfer methods;Ensure appropriate safety devices are available at the bedside     Goal: Patient will be free from infection during hospitalization  Outcome: Progressing   02/15/17 1950   Goal/Interventions addressed this shift   Free from Infection during hospitalization Assess and monitor for signs and symptoms of infection       Problem: Pain  Goal: Pain at adequate level as identified by patient  Outcome: Progressing   02/15/17 1950   Goal/Interventions addressed this shift   Pain at adequate level as identified by patient Identify patient comfort function goal;Offer non-pharmacological pain management interventions;Evaluate if patient comfort function goal is met       Problem: Altered GI Function  Goal: Fluid and electrolyte balance are achieved/maintained  Outcome: Progressing   02/15/17 1950   Goal/Interventions addressed this shift   Fluid and electrolyte balance are achieved/maintained Provide adequate hydration     Goal: No bleeding  Outcome: Progressing      Problem: Hemodynamic Status: Cardiac  Goal: Stable vital signs and fluid balance  Outcome: Progressing   02/15/17 1950   Goal/Interventions addressed this shift   Stable vital signs and fluid balance Monitor/assess vital signs and telemetry per unit protocol       Problem: Impaired Mobility  Goal: Mobility/Activity is maintained at optimal level for patient  Outcome: Progressing   02/15/17 1950   Goal/Interventions addressed this shift   Mobility/activity is maintained at optimal level for patient Increase mobility as tolerated/progressive mobility;Encourage independent activity per ability        Comments:   Medicine Shift Note    Patient Lines/Drains/Airways Status    Active Lines, Drains and Airways     Name:   Placement date:   Placement time:   Site:   Days:    Peripheral IV 02/09/17 Left Hand  02/09/17    1805    Hand    6    Peripheral IV 02/12/17 Right Wrist  02/12/17    1500    Wrist    3                Last BM: 02/14/2017    Pending Orders: N/A    Discharge Plan: N/A    POC: Fluid restriction, daily weight, telemetry    Tele: Yes    Interpreter Needs: No

## 2017-02-15 NOTE — Plan of Care (Signed)
Problem: Safety  Goal: Patient will be free from injury during hospitalization  Outcome: Progressing   02/15/17 0335   Goal/Interventions addressed this shift   Patient will be free from injury during hospitalization  Assess patient's risk for falls and implement fall prevention plan of care per policy;Use appropriate transfer methods;Provide and maintain safe environment;Include patient/ family/ care giver in decisions related to safety;Assess for patients risk for elopement and implement Elopement Risk Plan per policy;Provide alternative method of communication if needed (communication boards, writing)     Goal: Patient will be free from infection during hospitalization  Outcome: Progressing   02/15/17 0335   Goal/Interventions addressed this shift   Free from Infection during hospitalization Assess and monitor for signs and symptoms of infection;Monitor lab/diagnostic results       Problem: Pain  Goal: Pain at adequate level as identified by patient  Outcome: Progressing   02/15/17 0335   Goal/Interventions addressed this shift   Pain at adequate level as identified by patient Identify patient comfort function goal;Assess pain on admission, during daily assessment and/or before any "as needed" intervention(s)       Problem: Moderate/High Fall Risk Score >5  Goal: Patient will remain free of falls  Outcome: Progressing   02/14/17 2309   OTHER   Moderate Risk (6-13) MOD-Initiate Yellow "Fall Risk" magnet communication tool;MOD-Use of chair-pad alarm when appropriate;MOD-Use of assistive devices-bedside commode if appropriate;MOD-Place bedside commode and assistive devices out of sight when not in use;MOD-Place Fall Risk level on whiteboard in room       Problem: Altered GI Function  Goal: Fluid and electrolyte balance are achieved/maintained  Outcome: Progressing   02/15/17 0335   Goal/Interventions addressed this shift   Fluid and electrolyte balance are achieved/maintained Monitor intake and output every  shift;Monitor/assess lab values and report abnormal values;Provide adequate hydration;Assess for confusion/personality changes;Assess and reassess fluid and electrolyte status;Observe for seizure activity and initiate seizure precautions if indicated       Problem: Hemodynamic Status: Cardiac  Goal: Stable vital signs and fluid balance  Outcome: Progressing   02/15/17 0335   Goal/Interventions addressed this shift   Stable vital signs and fluid balance Monitor/assess vital signs and telemetry per unit protocol;Assess signs and symptoms associated with cardiac rhythm changes;Monitor lab values;Monitor for leg swelling/edema and report to LIP if abnormal       Problem: Inadequate Tissue Perfusion  Goal: Adequate tissue perfusion will be maintained  Outcome: Progressing   02/15/17 0335   Goal/Interventions addressed this shift   Adequate tissue perfusion will be maintained Monitor/assess vital signs;Monitor/assess lab values and report abnormal values;Monitor/assess neurovascular status (pulses, capillary refill, pain, paresthesia, paralysis, presence of edema);Monitor intake and output;Monitor/assess for signs of VTE (edema of calf/thigh redness, pain);Monitor for signs and symptoms of a pulmonary embolism (dyspnea, tachypnea, tachycardia, confusion);Encourage/assist patient as needed to turn, cough, and perform deep breathing every 2 hours       Problem: Ineffective Gas Exchange  Goal: Effective breathing pattern  Outcome: Progressing   02/15/17 0335   Goal/Interventions addressed this shift   Effective breathing pattern Maintain O2 saturation level per LIP order       Problem: Impaired Mobility  Goal: Mobility/Activity is maintained at optimal level for patient  Outcome: Progressing   02/15/17 0335   Goal/Interventions addressed this shift   Mobility/activity is maintained at optimal level for patient Increase mobility as tolerated/progressive mobility;Encourage independent activity per ability;Plan activities to  conserve energy, plan rest periods;Maintain proper body alignment

## 2017-02-15 NOTE — Progress Notes (Signed)
Medicine Shift Note    Patient Lines/Drains/Airways Status    Active Lines, Drains and Airways     Name:   Placement date:   Placement time:   Site:   Days:    Peripheral IV 02/09/17 Left Hand  02/09/17    1805    Hand    5    Peripheral IV 02/12/17 Right Wrist  02/12/17    1500    Wrist    2                Last BM: 02/14/17    Pending Orders: n/a    Discharge Plan: to be determined    POC: n/a    Skin: intact     Tele: yes, for complex cardiac disorder    Activity: Walks in room with one assist    Interpreter Needs: no, prefers English    Shift Note: Tele called for runs of V-tach. Pt is asymptomatic. Denies chest pain, SOB. Stable VS. MD made aware. Add-on Mg lab ordered. Will continue to monitor.

## 2017-02-15 NOTE — Progress Notes (Signed)
PROGRESS NOTE    Date Time: 02/15/17 3:35 PM  Patient Name: [name],[name]  Attending Physician: Schuyler Amor, MD    Assessment:   Symptomatic anemia   Tachycardia   Hypotension   Chronic ulcerative colitis   AKI multifactorial   New onset atrial fibrillation   Acute on chronic systolic HFrEF  Plan:   S/p colonoscopy and EGD c/w AVM  Hold AC   Echo c/w low EF 36% systolic HF acute on chronic   LHC/RHC by cardiology family and patient agreed to proceed for cath   Continue PPI   Continue coreg/ACEI   SCD for DVT prophylaxis   Case d/w patient and staff care coordinated   History of Present Illness:   [name] is a 83 y.o. male who presents to the hospital with abnormal labs with severe anemia in ED he was hypotensive seen by [facility] for evaluation of admission to ICU however his repeat BP showed improvement and he is  admitted to [facility]   He c/o feeling weak and dizziness   Denies any abdominal pain , nausea vomiting   No hematemesis melena or hematochezia  No hemoptysis, cough or fever   No cp or SOB   Rest of ROS  negative    Patient was seen by me in the ED last night however he became hypotensive and tachycardiac and [facility] was consulted for possible ICU admission   Past Medical History:     Past Medical History:   Diagnosis Date   . Anemia     iron low   . DOE (dyspnea on exertion)     mild   . Glaucoma NEC    . Hypertensive disorder    . Skin cancer     2006 removed mole   . Ulcerative colitis, chronic    . Vision abnormalities        Past Surgical History:     Past Surgical History:   Procedure Laterality Date   . EGD, COLONOSCOPY  04/03/2012    Procedure: EGD, COLONOSCOPY;  Surgeon: Lestine Mount, MD;  Location: [facility];  Service: Gastroenterology;  Laterality: N/A;   . EGD, COLONOSCOPY N/A 02/11/2017    Procedure: EGD, COLONOSCOPY;  Surgeon: Lestine Mount, MD;  Location: [id number];  Service: Gastroenterology;  Laterality: N/A;   . EYE SURGERY      cataract       Family History:   History reviewed. No  pertinent family history.    Social History:     Social History     Social History   . Marital status: Married     Spouse name: N/A   . Number of children: N/A   . Years of education: N/A     Social History Main Topics   . Smoking status: Former Smoker     Packs/day: 1.00     Years: 12.00     Quit date: 12/18/1961   . Smokeless tobacco: Never Used   . Alcohol use 0.0 oz/week      Comment: 0.5oz/d   . Drug use: No   . Sexual activity: Not on file     Other Topics Concern   . Not on file     Social History Narrative   . No narrative on file       Allergies:   No Known Allergies    Medications:     Prescriptions Prior to Admission   Medication Sig   . IRON PO Take 200 mg by  mouth daily.   Marland Kitchen lisinopril (PRINIVIL,ZESTRIL) 5 MG tablet Take by mouth daily.       Review of Systems:   A comprehensive review of systems was: per HPI     Physical Exam:     Vitals:    02/15/17 1118   BP: 107/62   Pulse: 65   Resp: 20   Temp: 98.2 F (36.8 C)   SpO2: 92%       Intake and Output Summary (Last 24 hours) at Date Time    Intake/Output Summary (Last 24 hours) at 02/15/17 1535  Last data filed at 02/15/17 0800   Gross per 24 hour   Intake                0 ml   Output             1500 ml   Net            -1500 ml       General appearance - alert, well appearing, and in no distress  Mouth - mucous membranes moist, pharynx normal without lesions  Neck - supple, no significant adenopathy  Lymphatics - no palpable lymphadenopathy, no hepatosplenomegaly  Chest - clear to auscultation, no wheezes, rales or rhonchi, symmetric air entry  Heart - S1 and S2 normal, no murmurs noted, no gallops noted, no JVD, tachycardic   Abdomen - soft, nontender, nondistended, no masses or organomegaly  Neurological - alert, oriented, normal speech, no focal findings or movement disorder noted  Musculoskeletal - no joint tenderness, deformity or swelling  Extremities - peripheral pulses normal, no pedal edema, no clubbing or cyanosis  Skin - normal coloration  and turgor, no rashes, no suspicious skin lesions noted         Labs:     Results     Procedure Component Value Units Date/Time    Magnesium [[id number]] Collected:  02/15/17 0341    Specimen:  Blood Updated:  02/15/17 0828     Magnesium 2.0 mg/dL     Comprehensive metabolic panel [[id number]]  (Abnormal) Collected:  02/15/17 0341    Specimen:  Blood Updated:  02/15/17 0438     Glucose 101 (H) mg/dL      BUN 86.5 (H) mg/dL      Creatinine 1.6 (H) mg/dL      Sodium 784 mEq/L      Potassium 3.6 mEq/L      Chloride 108 mEq/L      CO2 24 mEq/L      Calcium 8.1 mg/dL      Protein, Total 5.1 (L) g/dL      Albumin 2.7 (L) g/dL      AST (SGOT) 10 U/L      ALT 10 U/L      Alkaline Phosphatase 66 U/L      Bilirubin, Total 0.4 mg/dL      Globulin 2.4 g/dL      Albumin/Globulin Ratio 1.1    GFR [[id number]] Collected:  02/15/17 0341     Updated:  02/15/17 0438     EGFR 41.0    CBC and differential [[id number]]  (Abnormal) Collected:  02/15/17 0341    Specimen:  Blood from Blood Updated:  02/15/17 0401     WBC 8.93 x10 3/uL      Hgb 8.3 (L) g/dL      Hematocrit 72.5 (L) %      Platelets 222 x10 3/uL      RBC 3.20 (L) x10 6/uL  MCV 87.5 fL      MCH 25.9 (L) pg      MCHC 29.6 (L) g/dL      RDW 15 %      MPV 10.6 fL      Neutrophils 77.9 %      Lymphocytes Automated 4.7 %      Monocytes 12.1 %      Eosinophils Automated 4.6 %      Basophils Automated 0.3 %      Immature Granulocyte 0.4 %      Nucleated RBC 0.0 /100 WBC      Neutrophils Absolute 6.95 x10 3/uL      Abs Lymph Automated 0.42 (L) x10 3/uL      Abs Mono Automated 1.08 x10 3/uL      Abs Eos Automated 0.41 x10 3/uL      Absolute Baso Automated 0.03 x10 3/uL      Absolute Immature Granulocyte 0.04 x10 3/uL      Absolute NRBC 0.00 x10 3/uL     Hemoglobin and hematocrit, blood [[id number]]  (Abnormal) Collected:  02/14/17 2020    Specimen:  Blood Updated:  02/14/17 2036     Hgb 8.7 (L) g/dL      Hematocrit 29.5 (L) %               Rads:   Radiological Procedure  reviewed.    Signed by: Schuyler Amor MD  [id number]

## 2017-02-16 LAB — CBC AND DIFFERENTIAL
Absolute NRBC: 0 10*3/uL
Basophils Absolute Automated: 0.03 10*3/uL (ref 0.00–0.20)
Basophils Automated: 0.4 %
Eosinophils Absolute Automated: 0.33 10*3/uL (ref 0.00–0.70)
Eosinophils Automated: 3.9 %
Hematocrit: 27 % — ABNORMAL LOW (ref 42.0–52.0)
Hgb: 7.9 g/dL — ABNORMAL LOW (ref 13.0–17.0)
Immature Granulocytes Absolute: 0.04 10*3/uL
Immature Granulocytes: 0.5 %
Lymphocytes Absolute Automated: 0.44 10*3/uL — ABNORMAL LOW (ref 0.50–4.40)
Lymphocytes Automated: 5.2 %
MCH: 25.6 pg — ABNORMAL LOW (ref 28.0–32.0)
MCHC: 29.3 g/dL — ABNORMAL LOW (ref 32.0–36.0)
MCV: 87.7 fL (ref 80.0–100.0)
MPV: 10.1 fL (ref 9.4–12.3)
Monocytes Absolute Automated: 1.12 10*3/uL (ref 0.00–1.20)
Monocytes: 13.1 %
Neutrophils Absolute: 6.58 10*3/uL (ref 1.80–8.10)
Neutrophils: 76.9 %
Nucleated RBC: 0 /100 WBC (ref 0.0–1.0)
Platelets: 206 10*3/uL (ref 140–400)
RBC: 3.08 10*6/uL — ABNORMAL LOW (ref 4.70–6.00)
RDW: 15 % (ref 12–15)
WBC: 8.54 10*3/uL (ref 3.50–10.80)

## 2017-02-16 LAB — COMPREHENSIVE METABOLIC PANEL
ALT: 9 U/L (ref 0–55)
AST (SGOT): 10 U/L (ref 5–34)
Albumin/Globulin Ratio: 1 (ref 0.9–2.2)
Albumin: 2.5 g/dL — ABNORMAL LOW (ref 3.5–5.0)
Alkaline Phosphatase: 62 U/L (ref 38–106)
BUN: 37 mg/dL — ABNORMAL HIGH (ref 9.0–28.0)
Bilirubin, Total: 0.4 mg/dL (ref 0.2–1.2)
CO2: 22 mEq/L (ref 22–29)
Calcium: 8.1 mg/dL (ref 7.9–10.2)
Chloride: 110 mEq/L (ref 100–111)
Creatinine: 1.2 mg/dL (ref 0.7–1.3)
Globulin: 2.6 g/dL (ref 2.0–3.6)
Glucose: 103 mg/dL — ABNORMAL HIGH (ref 70–100)
Potassium: 3.8 mEq/L (ref 3.5–5.1)
Protein, Total: 5.1 g/dL — ABNORMAL LOW (ref 6.0–8.3)
Sodium: 139 mEq/L (ref 136–145)

## 2017-02-16 LAB — GFR: EGFR: 57.2

## 2017-02-16 MED ORDER — POLYETHYLENE GLYCOL 3350 17 G PO PACK
17.00 g | PACK | Freq: Every day | ORAL | Status: DC | PRN
Start: 2017-02-16 — End: 2017-02-23
  Administered 2017-02-16: 12:00:00 17 g via ORAL
  Filled 2017-02-16: qty 1

## 2017-02-16 NOTE — Plan of Care (Signed)
Problem: Safety  Goal: Patient will be free from injury during hospitalization  Outcome: Progressing   02/16/17 1533   Goal/Interventions addressed this shift   Patient will be free from injury during hospitalization  Assess patient's risk for falls and implement fall prevention plan of care per policy;Provide and maintain safe environment;Use appropriate transfer methods;Ensure appropriate safety devices are available at the bedside;Include patient/ family/ care giver in decisions related to safety;Hourly rounding;Assess for patients risk for elopement and implement Elopement Risk Plan per policy;Provide alternative method of communication if needed (communication boards, writing)     Goal: Patient will be free from infection during hospitalization  Outcome: Progressing   02/16/17 1533   Goal/Interventions addressed this shift   Free from Infection during hospitalization Assess and monitor for signs and symptoms of infection;Monitor lab/diagnostic results;Monitor all insertion sites (i.e. indwelling lines, tubes, urinary catheters, and drains)       Problem: Pain  Goal: Pain at adequate level as identified by patient  Outcome: Progressing   02/16/17 1533   Goal/Interventions addressed this shift   Pain at adequate level as identified by patient Identify patient comfort function goal;Reassess pain within 30-60 minutes of any procedure/intervention, per Pain Assessment, Intervention, Reassessment (AIR) Cycle;Evaluate if patient comfort function goal is met;Evaluate patient's satisfaction with pain management progress;Offer non-pharmacological pain management interventions;Include patient/patient care companion in decisions related to pain management as needed       Problem: Moderate/High Fall Risk Score >5  Goal: Patient will remain free of falls  Outcome: Progressing   02/16/17 1045   OTHER   Moderate Risk (6-13) MOD-Remain with patient during toileting;MOD-Utilize diversion activities;MOD-Place Fall Risk level on  whiteboard in room       Problem: Altered GI Function  Goal: Fluid and electrolyte balance are achieved/maintained  Outcome: Progressing   02/16/17 1533   Goal/Interventions addressed this shift   Fluid and electrolyte balance are achieved/maintained Provide adequate hydration;Assess for confusion/personality changes;Observe for seizure activity and initiate seizure precautions if indicated;Observe for cardiac arrhythmias     Goal: No bleeding  Outcome: Progressing   02/16/17 1533   Goal/Interventions addressed this shift   No bleeding  Monitor and assess vitals and hemodynamic parameters;Monitor/assess lab values and report abnormal values;Assess for bruising/petechia       Problem: Hemodynamic Status: Cardiac  Goal: Stable vital signs and fluid balance  Outcome: Progressing   02/16/17 1533   Goal/Interventions addressed this shift   Stable vital signs and fluid balance Monitor/assess vital signs and telemetry per unit protocol;Monitor for leg swelling/edema and report to LIP if abnormal;Monitor lab values       Problem: Inadequate Tissue Perfusion  Goal: Adequate tissue perfusion will be maintained  Outcome: Progressing   02/16/17 1533   Goal/Interventions addressed this shift   Adequate tissue perfusion will be maintained Monitor/assess vital signs;Monitor/assess lab values and report abnormal values;Monitor/assess neurovascular status (pulses, capillary refill, pain, paresthesia, paralysis, presence of edema);Monitor/assess for signs of VTE (edema of calf/thigh redness, pain);Monitor for signs and symptoms of a pulmonary embolism (dyspnea, tachypnea, tachycardia, confusion);VTE Prevention: Administer anticoagulant(s) and/or apply anti-embolism stockings/devices as ordered;Reinforce use of ordered respiratory interventions (i.e. CPAP, BiPAP, Incentive Spirometer, Acapella, etc.);Increase mobility as tolerated/progressive mobility;Assess and monitor skin integrity       Problem: Ineffective Gas Exchange  Goal:  Effective breathing pattern  Outcome: Progressing   02/16/17 1533   Goal/Interventions addressed this shift   Effective breathing pattern Maintain O2 saturation level per LIP order;Teach/reinforce use of ordered respiratory interventions (ie.  CPAP, BiPAP, Incentive Spirometer, Acapella);Monitor for medication induced respiratory depression       Problem: Impaired Mobility  Goal: Mobility/Activity is maintained at optimal level for patient  Outcome: Progressing   02/16/17 1533   Goal/Interventions addressed this shift   Mobility/activity is maintained at optimal level for patient Increase mobility as tolerated/progressive mobility;Encourage independent activity per ability;Reposition patient every 2 hours and as needed unless able to reposition self;Plan activities to conserve energy, plan rest periods

## 2017-02-16 NOTE — Progress Notes (Signed)
PROGRESS NOTE    Date Time: 02/16/17 10:58 AM  Patient Name: [name],[name]  Attending Physician: Schuyler Amor, MD    Assessment:   Symptomatic anemia   Tachycardia   Hypotension   Chronic ulcerative colitis   AKI multifactorial   New onset atrial fibrillation   Acute on chronic systolic HFrEF  Plan:   S/p colonoscopy and EGD c/w AVM  LHC on Friday at 7am per cardiology   Continue PPI   SCD for DVT prophylaxis   Case d/w patient and staff care coordinated   History of Present Illness:   [name] is a 83 y.o. male who presents to the hospital with abnormal labs with severe anemia in ED he was hypotensive seen by [facility] for evaluation of admission to ICU however his repeat BP showed improvement and he is  admitted to [facility]   He c/o feeling weak and dizziness   Denies any abdominal pain , nausea vomiting   No hematemesis melena or hematochezia  No hemoptysis, cough or fever   No cp or SOB   Rest of ROS  negative    Patient was seen by me in the ED last night however he became hypotensive and tachycardiac and [facility] was consulted for possible ICU admission   Past Medical History:     Past Medical History:   Diagnosis Date   . Anemia     iron low   . DOE (dyspnea on exertion)     mild   . Glaucoma NEC    . Hypertensive disorder    . Skin cancer     2006 removed mole   . Ulcerative colitis, chronic    . Vision abnormalities        Past Surgical History:     Past Surgical History:   Procedure Laterality Date   . EGD, COLONOSCOPY  04/03/2012    Procedure: EGD, COLONOSCOPY;  Surgeon: Lestine Mount, MD;  Location: [facility];  Service: Gastroenterology;  Laterality: N/A;   . EGD, COLONOSCOPY N/A 02/11/2017    Procedure: EGD, COLONOSCOPY;  Surgeon: Lestine Mount, MD;  Location: [facility];  Service: Gastroenterology;  Laterality: N/A;   . EYE SURGERY      cataract       Family History:   History reviewed. No pertinent family history.    Social History:     Social History     Social History   . Marital status: Married      Spouse name: N/A   . Number of children: N/A   . Years of education: N/A     Social History Main Topics   . Smoking status: Former Smoker     Packs/day: 1.00     Years: 12.00     Quit date: 12/18/1961   . Smokeless tobacco: Never Used   . Alcohol use 0.0 oz/week      Comment: 0.5oz/d   . Drug use: No   . Sexual activity: Not on file     Other Topics Concern   . Not on file     Social History Narrative   . No narrative on file       Allergies:   No Known Allergies    Medications:     Prescriptions Prior to Admission   Medication Sig   . IRON PO Take 200 mg by mouth daily.   Marland Kitchen lisinopril (PRINIVIL,ZESTRIL) 5 MG tablet Take by mouth daily.       Review of Systems:  A comprehensive review of systems was: per HPI     Physical Exam:     Vitals:    02/16/17 0731   BP: 107/65   Pulse: 64   Resp: 18   Temp:    SpO2: 96%       Intake and Output Summary (Last 24 hours) at Date Time    Intake/Output Summary (Last 24 hours) at 02/16/17 1058  Last data filed at 02/15/17 1800   Gross per 24 hour   Intake              830 ml   Output                0 ml   Net              830 ml       General appearance - alert, well appearing, and in no distress  Mouth - mucous membranes moist, pharynx normal without lesions  Neck - supple, no significant adenopathy  Lymphatics - no palpable lymphadenopathy, no hepatosplenomegaly  Chest - clear to auscultation, no wheezes, rales or rhonchi, symmetric air entry  Heart - S1 and S2 normal, no murmurs noted, no gallops noted, no JVD, tachycardic   Abdomen - soft, nontender, nondistended, no masses or organomegaly  Neurological - alert, oriented, normal speech, no focal findings or movement disorder noted  Musculoskeletal - no joint tenderness, deformity or swelling  Extremities - peripheral pulses normal, no pedal edema, no clubbing or cyanosis  Skin - normal coloration and turgor, no rashes, no suspicious skin lesions noted         Labs:     Results     Procedure Component Value Units Date/Time     Comprehensive metabolic panel [[id number]]  (Abnormal) Collected:  02/16/17 0401    Specimen:  Blood Updated:  02/16/17 0457     Glucose 103 (H) mg/dL      BUN 40.9 (H) mg/dL      Creatinine 1.2 mg/dL      Sodium 811 mEq/L      Potassium 3.8 mEq/L      Chloride 110 mEq/L      CO2 22 mEq/L      Calcium 8.1 mg/dL      Protein, Total 5.1 (L) g/dL      Albumin 2.5 (L) g/dL      AST (SGOT) 10 U/L      ALT 9 U/L      Alkaline Phosphatase 62 U/L      Bilirubin, Total 0.4 mg/dL      Globulin 2.6 g/dL      Albumin/Globulin Ratio 1.0    GFR [[id number]] Collected:  02/16/17 0401     Updated:  02/16/17 0457     EGFR 57.2    CBC and differential [[id number]]  (Abnormal) Collected:  02/16/17 0401    Specimen:  Blood from Blood Updated:  02/16/17 0423     WBC 8.54 x10 3/uL      Hgb 7.9 (L) g/dL      Hematocrit 46.9 (L) %      Platelets 206 x10 3/uL      RBC 3.08 (L) x10 6/uL      MCV 87.7 fL      MCH 25.6 (L) pg      MCHC 29.3 (L) g/dL      RDW 15 %      MPV 10.1 fL      Neutrophils 76.9 %  Lymphocytes Automated 5.2 %      Monocytes 13.1 %      Eosinophils Automated 3.9 %      Basophils Automated 0.4 %      Immature Granulocyte 0.5 %      Nucleated RBC 0.0 /100 WBC      Neutrophils Absolute 6.58 x10 3/uL      Abs Lymph Automated 0.44 (L) x10 3/uL      Abs Mono Automated 1.12 x10 3/uL      Abs Eos Automated 0.33 x10 3/uL      Absolute Baso Automated 0.03 x10 3/uL      Absolute Immature Granulocyte 0.04 x10 3/uL      Absolute NRBC 0.00 x10 3/uL     Hemoglobin and hematocrit, blood [[id number]]  (Abnormal) Collected:  02/15/17 1602    Specimen:  Blood Updated:  02/15/17 1615     Hgb 8.4 (L) g/dL      Hematocrit 40.9 (L) %               Rads:   Radiological Procedure reviewed.    Signed by: Schuyler Amor MD  [id number]

## 2017-02-16 NOTE — Progress Notes (Incomplete)
Medicine Shift Note    Patient Lines/Drains/Airways Status    Active Lines, Drains and Airways     Name:   Placement date:   Placement time:   Site:   Days:    Peripheral IV 02/09/17 Left Hand  02/09/17    1805    Hand    6    Peripheral IV 02/12/17 Right Wrist  02/12/17    1500    Wrist    3                Last BM:02/15/17    Pending Orders: For cardiac cath date to be (arranged)    Discharge Plan: in progress, further treatment and testing to be done.    POC: continue PPI,     Skin: {any wound? Consult}    Tele: {yes/no, comment why}    Activity: {patient's level of participation/movement}    Interpreter Needs: {yes/no}, Used: {yes/NO}, Method: {type}, Waiver Need/present: {comment}    Shift Note: {comment}

## 2017-02-16 NOTE — Progress Notes (Signed)
[facility]   Jaeson Molstad M. Lucianne Muss, MD, [facility]  PROGRESS NOTE  Office: [phone]  Cell phone [phone]                                       Date Time: 02/16/17 8:41 AM  Patient Name: [name],[name]      Assessment:    Ventricular tachycardia, non-sustained, recorded on tele on 02/10/17 at 12:07- asymptomatic and hemodynamically stable.    Atrial fibrillation, new onset, with rapid ventricular response   CHF with reduced EF of 36%   Severe pulmonary hypertension with an estimated PASP of 63 mmHg by echo   GI bleeding-    Anemia   CKD stage III   Dyslipidemia   Plan:    Hold off on anticoagulation for AF    Coreg 3.125 mg bid   Lisinopril 5 mg bid   Left and right heart cath on Friday 02/18/17 at 7 am.  Recent Cardiac Work up:   Echo 02/11/17:   The left ventricle is normal in size.    * Left ventricular systolic function is moderately decreased with an  ejection fraction by Biplane Method of Discs of  36 %.    * Left Ventricular diastolic filling parameters are consistent with Grade II  diastolic dysfunction (pseudonormal pattern).    * Right ventricular size and systolic function are normal.    * Mild bi-atrial enlargement.    * Moderate to severe mitral valve regurgitation.    * There is mild tricuspid regurgitation.    * Moderate pulmonary hypertension with estimated right ventricular systolic  pressure of  63.29 mmHg.  EKG 02/09/17:   AF with RVR at 130 bpm  NS STT abnormalities  BNP 02/10/17:  2921  Subjective:   He has dyspnea with minimal exertion. Denies chest pain.    Medications:     Current Facility-Administered Medications   Medication Dose Route Frequency   . carvedilol  3.125 mg Oral Q12H SCH   . lisinopril  5 mg Oral BID   . pantoprazole  40 mg Oral BID         Physical Exam:     Vitals:    02/16/17 0731   BP: 107/65   Pulse: 64   Resp: 18   Temp:    SpO2: 96%       Intake and Output Summary (Last 24 hours) at Date Time    Intake/Output Summary (Last 24 hours) at 02/16/17  0841  Last data filed at 02/15/17 1800   Gross per 24 hour   Intake             1070 ml   Output                0 ml   Net             1070 ml       General appearance - in no distress   Head: NC/AT, mm moist  Neck - supple, Carotids 2+, no bruit, no JVD   Chest - clear to auscultation, no wheezes, rales or rhonchi, symmetric air entry   Heart - normal rate and regular rhythm, S1 and S2 normal, no murmurs   Abdomen - soft, nontender, nondistended, no masses or organomegaly   Extremities - peripheral pulses normal, no pedal edema, no clubbing or cyanosis  Labs:   Recent CBC   Recent Labs      02/16/17   0401   RBC  3.08*   Hgb  7.9*   Hematocrit  27.0*   MCV  87.7   MCH  25.6*   MCHC  29.3*   RDW  15   MPV  10.1   Platelets  206     Recent BMP   Recent Labs      02/16/17   0401   Glucose  103*   BUN  37.0*   Creatinine  1.2   Calcium  8.1   Sodium  139   Potassium  3.8   Chloride  110   CO2  22     Recent CMP   Recent Labs      02/16/17   0401   Glucose  103*   BUN  37.0*   Creatinine  1.2   Sodium  139   CO2  22   Calcium  8.1   Albumin  2.5*   AST (SGOT)  10   ALT  9   Globulin  2.6     Recent CARDIAC ENZYMES No results for input(s): TROPONIN, ISTATTROPONI, CK, CKMB in the last 24 hours.    Invalid input(s): TROPONINT  Recent TSH No results for input(s): TSH in the last 24 hours.  Recent URIC ACID Invalid input(s): URIC  Recent BLOOD CULTURE No results for input(s): CXBLD in the last 24 hours.  Recent URINE CULTURE Invalid input(s): CXURN  Recent OCCULT BLOOD Invalid input(s): OCCBL  Recent PT/PTT No results for input(s): INR, PTT in the last 24 hours.    Invalid input(s): PTI, COUM, COUMP, ACOAG, ACOAP  Recent PT/INR No results for input(s): INR in the last 24 hours.    Invalid input(s): PTI, COUM, COUMP  Recent LIPID PANEL No results for input(s): CHOL, TRIG, HDL in the last 24 hours.    Invalid input(s): LDLC, VLDLC, LRAT  Recent ABG No results for input(s): TEMP, FIO2, RATE, MODE, ETCO2, PEEP in the last  24 hours.    Invalid input(s): APH, APCO2, APO2, AHCO3, ATCO2, ABE, AOSAT, ABGS, ALLEN, STATS, O2DEL, O2FLO, PRESS, VNTMN, PRSUP, TIVOL      Rads:   Radiological Procedure reviewed.    Signed by: Valerie Roys, MD, [facility]

## 2017-02-16 NOTE — Progress Notes (Signed)
Medicine Shift Note    Patient Lines/Drains/Airways Status    Active Lines, Drains and Airways     Name:   Placement date:   Placement time:   Site:   Days:    Peripheral IV 02/09/17 Left Hand  02/09/17    1805    Hand    7    Peripheral IV 02/12/17 Right Wrist  02/12/17    1500    Wrist    4                Last BM: 11/27, small stool  -Miralax given today    Pending Orders: [facility] 11/30 @ 07:00    Discharge Plan: TBD    POC: Daughter at bedside this a.m.     Skin: intact    Tele: yes - cardiac disorder    Activity: OOB ad lib    Interpreter Needs: N, Used: N, Method: NA, Waiver Need/present: NA    Shift Note: Pt complained of having small BM yesterday and wanted something on board to help have a normal BM.

## 2017-02-17 LAB — HEMOGLOBIN AND HEMATOCRIT, BLOOD
Hematocrit: 27.7 % — ABNORMAL LOW (ref 42.0–52.0)
Hgb: 8.3 g/dL — ABNORMAL LOW (ref 13.0–17.0)

## 2017-02-17 MED ORDER — LISINOPRIL 10 MG PO TABS
10.00 mg | ORAL_TABLET | Freq: Two times a day (BID) | ORAL | Status: DC
Start: 2017-02-17 — End: 2017-02-23
  Administered 2017-02-17 – 2017-02-22 (×7): 10 mg via ORAL
  Filled 2017-02-17 (×12): qty 1

## 2017-02-17 NOTE — Progress Notes (Signed)
PROGRESS NOTE    Date Time: 02/17/17 12:16 PM  Patient Name: [name],[name]  Attending Physician: Schuyler Amor, MD    Assessment:   Symptomatic anemia   Tachycardia   Hypotension   Chronic ulcerative colitis   AKI multifactorial   New onset atrial fibrillation   Acute on chronic systolic HFrEF  Plan:   S/p colonoscopy and EGD c/w AVM h/h stable   LHC scheduled tomorrow  at 7am    Continue PPI   SCD for DVT prophylaxis   Case d/w patient and staff care coordinated   History of Present Illness:   [name] is a 83 y.o. male who presents to the hospital with abnormal labs with severe anemia in ED he was hypotensive seen by [facility] for evaluation of admission to ICU however his repeat BP showed improvement and he is  admitted to [facility]   He c/o feeling weak and dizziness   Denies any abdominal pain , nausea vomiting   No hematemesis melena or hematochezia  No hemoptysis, cough or fever   No cp or SOB   Rest of ROS  negative    Patient was seen by me in the ED last night however he became hypotensive and tachycardiac and [facility] was consulted for possible ICU admission   Past Medical History:     Past Medical History:   Diagnosis Date   . Anemia     iron low   . DOE (dyspnea on exertion)     mild   . Glaucoma NEC    . Hypertensive disorder    . Skin cancer     2006 removed mole   . Ulcerative colitis, chronic    . Vision abnormalities        Past Surgical History:     Past Surgical History:   Procedure Laterality Date   . EGD, COLONOSCOPY  04/03/2012    Procedure: EGD, COLONOSCOPY;  Surgeon: Lestine Mount, MD;  Location: [facility];  Service: Gastroenterology;  Laterality: N/A;   . EGD, COLONOSCOPY N/A 02/11/2017    Procedure: EGD, COLONOSCOPY;  Surgeon: Lestine Mount, MD;  Location: [facility];  Service: Gastroenterology;  Laterality: N/A;   . EYE SURGERY      cataract       Family History:   History reviewed. No pertinent family history.    Social History:     Social History     Social History   . Marital status:  Married     Spouse name: N/A   . Number of children: N/A   . Years of education: N/A     Social History Main Topics   . Smoking status: Former Smoker     Packs/day: 1.00     Years: 12.00     Quit date: 12/18/1961   . Smokeless tobacco: Never Used   . Alcohol use 0.0 oz/week      Comment: 0.5oz/d   . Drug use: No   . Sexual activity: Not on file     Other Topics Concern   . Not on file     Social History Narrative   . No narrative on file       Allergies:   No Known Allergies    Medications:     Prescriptions Prior to Admission   Medication Sig   . IRON PO Take 200 mg by mouth daily.   Marland Kitchen lisinopril (PRINIVIL,ZESTRIL) 5 MG tablet Take by mouth daily.       Review  of Systems:   A comprehensive review of systems was: per HPI     Physical Exam:     Vitals:    02/17/17 1100   BP: 104/64   Pulse: 69   Resp: 16   Temp: 98.1 F (36.7 C)   SpO2: 99%       Intake and Output Summary (Last 24 hours) at Date Time    Intake/Output Summary (Last 24 hours) at 02/17/17 1216  Last data filed at 02/17/17 0600   Gross per 24 hour   Intake              200 ml   Output              600 ml   Net             -400 ml       General appearance - alert, well appearing, and in no distress  Mouth - mucous membranes moist, pharynx normal without lesions  Neck - supple, no significant adenopathy  Lymphatics - no palpable lymphadenopathy, no hepatosplenomegaly  Chest - clear to auscultation, no wheezes, rales or rhonchi, symmetric air entry  Heart - S1 and S2 normal, no murmurs noted, no gallops noted, no JVD, tachycardic   Abdomen - soft, nontender, nondistended, no masses or organomegaly  Neurological - alert, oriented, normal speech, no focal findings or movement disorder noted  Musculoskeletal - no joint tenderness, deformity or swelling  Extremities - peripheral pulses normal, no pedal edema, no clubbing or cyanosis  Skin - normal coloration and turgor, no rashes, no suspicious skin lesions noted         Labs:     Results     Procedure  Component Value Units Date/Time    Hemoglobin and hematocrit, blood [[id number]]  (Abnormal) Collected:  02/17/17 0411    Specimen:  Blood Updated:  02/17/17 0518     Hgb 8.3 (L) g/dL      Hematocrit 40.9 (L) %               Rads:   Radiological Procedure reviewed.    Signed by: Schuyler Amor MD  [id number]

## 2017-02-17 NOTE — Progress Notes (Signed)
Medicine Shift Note    Patient Lines/Drains/Airways Status    Active Lines, Drains and Airways     Name:   Placement date:   Placement time:   Site:   Days:    Peripheral IV 02/09/17 Left Hand  02/09/17    1805    Hand    7    Peripheral IV 02/12/17 Right Wrist  02/12/17    1500    Wrist    4                Last BM:11/27  Pending Orders:  Cardiac cath on Friday    Discharge Plan: home  POC: Daughter  Skin:intact  Tele SR with PVC,bigeminies, trigeminies    Activity: Stand by    Interpreter Needs: No    Shift Note: See care plan

## 2017-02-17 NOTE — Progress Notes (Signed)
Medicine Shift Note    Patient Lines/Drains/Airways Status    Active Lines, Drains and Airways     Name:   Placement date:   Placement time:   Site:   Days:    Peripheral IV 02/09/17 Left Hand  02/09/17    1805    Hand    7    Peripheral IV 02/12/17 Right Wrist  02/12/17    1500    Wrist    4                Last BM: 02/17/17    Pending Orders: Cardiac cath on Friday    Discharge Plan:home    POC: Daughter     Skin: intact    Tele: NSR WITH SOME PVC,S    Activity: One person assist     Interpreter Needs: no, Used: NO, Method: na Waiver Need/present: na    Shift Note: No complain of pain at the moment . Monitor for any changes

## 2017-02-17 NOTE — Plan of Care (Signed)
Problem: Moderate/High Fall Risk Score >5  Goal: Patient will remain free of falls   02/17/17 0541   OTHER   High (Greater than 13) LOW-Fall Interventions Appropriate for Low Fall Risk;MOD-Remain with patient during toileting       Problem: Altered GI Function  Goal: Fluid and electrolyte balance are achieved/maintained  Outcome: Progressing   02/16/17 1533   Goal/Interventions addressed this shift   Fluid and electrolyte balance are achieved/maintained Provide adequate hydration;Assess for confusion/personality changes;Observe for seizure activity and initiate seizure precautions if indicated;Observe for cardiac arrhythmias     Goal: No bleeding  Outcome: Progressing   02/17/17 0541   Goal/Interventions addressed this shift   No bleeding  Monitor and assess vitals and hemodynamic parameters;Monitor/assess lab values and report abnormal values;Assess for bruising/petechia       Problem: Hemodynamic Status: Cardiac  Goal: Stable vital signs and fluid balance  Outcome: Not Progressing   02/16/17 1533   Goal/Interventions addressed this shift   Stable vital signs and fluid balance Monitor/assess vital signs and telemetry per unit protocol;Monitor for leg swelling/edema and report to LIP if abnormal;Monitor lab values       Problem: Inadequate Tissue Perfusion  Goal: Adequate tissue perfusion will be maintained  Outcome: Progressing      Problem: Ineffective Gas Exchange  Goal: Effective breathing pattern  Outcome: Progressing   02/16/17 1533   Goal/Interventions addressed this shift   Effective breathing pattern Maintain O2 saturation level per LIP order;Teach/reinforce use of ordered respiratory interventions (ie. CPAP, BiPAP, Incentive Spirometer, Acapella);Monitor for medication induced respiratory depression       Problem: Impaired Mobility  Goal: Mobility/Activity is maintained at optimal level for patient  Outcome: Progressing   02/16/17 1533   Goal/Interventions addressed this shift   Mobility/activity is  maintained at optimal level for patient Increase mobility as tolerated/progressive mobility;Encourage independent activity per ability;Reposition patient every 2 hours and as needed unless able to reposition self;Plan activities to conserve energy, plan rest periods       Comments: AXOX4. MAE, minimal assist.  VSS SR with PVC's, RA 95 % sat . Requested O2 2L while sleeping PRN.  Crackles, no SOB.no CP.  No BM, no ss bleeding, good UOP.  Fall precaution maintained.

## 2017-02-17 NOTE — Plan of Care (Signed)
Problem: Moderate/High Fall Risk Score >5  Goal: Patient will remain free of falls   02/17/17 0800   OTHER   Moderate Risk (6-13) MOD-Initiate Yellow "Fall Risk" magnet communication tool;MOD-Consider activation of bed alarm if appropriate;MOD-Apply bed exit alarm if patient is confused;MOD-Floor mat at bedside (where available) if appropriate;MOD-include family in multidisciplinary POC discussions;MOD-Place Fall Risk level on whiteboard in room       Problem: Altered GI Function  Goal: Fluid and electrolyte balance are achieved/maintained  Outcome: Progressing   02/17/17 0831   Goal/Interventions addressed this shift   Fluid and electrolyte balance are achieved/maintained Monitor/assess lab values and report abnormal values;Assess and reassess fluid and electrolyte status;Monitor intake and output every shift

## 2017-02-17 NOTE — Progress Notes (Signed)
[facility]   [name] M. Lucianne Muss, MD, [facility]  PROGRESS NOTE  Office: [phone]  Cell phone [phone]                                       Date Time: 02/17/17 7:11 AM  Patient Name: [name],[name]      Assessment:    Ventricular tachycardia, non-sustained, recorded on tele on 02/10/17 at 12:07- asymptomatic and hemodynamically stable.    Atrial fibrillation, new onset, with rapid ventricular response. Converted to NSR spontaneously. Currently in SR. Patient with high risk of rebleed with use of anticoagulation given multiple AVMs (stomach, duodenum and cecum) noted on endoscopic procedures.   CHF with reduced EF of 36%   Severe pulmonary hypertension with an estimated PASP of 63 mmHg by echo   GI bleeding, no acute bleed.   Anemia, H/H stable.    CKD stage III- Cr stable.    Dyslipidemia   Plan:    Not a candidate for anticoagulation according to our GI consultant.    Will need Watchman device at some point.     Coreg 3.125 mg bid   Increase Lisinopril to 10 mg bid- this would be the max dose of ACEI for him.    Left and right heart cath on Friday 02/18/17 at 7 am.   NPO after MN tonight.  Recent Cardiac Work up:   Echo 02/11/17:   The left ventricle is normal in size.    * Left ventricular systolic function is moderately decreased with an  ejection fraction by Biplane Method of Discs of  36 %.    * Left Ventricular diastolic filling parameters are consistent with Grade II  diastolic dysfunction (pseudonormal pattern).    * Right ventricular size and systolic function are normal.    * Mild bi-atrial enlargement.    * Moderate to severe mitral valve regurgitation.    * There is mild tricuspid regurgitation.    * Moderate pulmonary hypertension with estimated right ventricular systolic  pressure of  63.29 mmHg.  EKG 02/09/17:   AF with RVR at 130 bpm  NS STT abnormalities  BNP 02/10/17:  2921  Subjective:   He has dyspnea with minimal exertion. Denies chest pain.    Medications:      Current Facility-Administered Medications   Medication Dose Route Frequency   . carvedilol  3.125 mg Oral Q12H SCH   . lisinopril  5 mg Oral BID   . pantoprazole  40 mg Oral BID         Physical Exam:     Vitals:    02/17/17 0405   BP: 108/59   Pulse: 71   Resp: 19   Temp: 98.2 F (36.8 C)   SpO2: 95%       Intake and Output Summary (Last 24 hours) at Date Time    Intake/Output Summary (Last 24 hours) at 02/17/17 0711  Last data filed at 02/17/17 0600   Gross per 24 hour   Intake              200 ml   Output              600 ml   Net             -400 ml       General appearance - in no distress   Head: NC/AT,  mm moist  Neck - supple, Carotids 2+, no bruit, no JVD   Chest - clear to auscultation, no wheezes, rales or rhonchi, symmetric air entry   Heart - normal rate and regular rhythm, S1 and S2 normal, no murmurs   Abdomen - soft, nontender, nondistended, no masses or organomegaly   Extremities - peripheral pulses normal, no pedal edema, no clubbing or cyanosis        Labs:   Recent CBC   Recent Labs      02/17/17   0411   Hgb  8.3*   Hematocrit  27.7*     Recent BMP   No results for input(s): GLU, BUN, CREAT, CA, NA, K, CL, CO2 in the last 24 hours.    Invalid input(s): AGAP  Recent CMP   No results for input(s): GLU, BUN, CREAT, NA, CK, CO2, CA, ALB, AST, ALT, ALP, GLOB in the last 24 hours.    Invalid input(s):  CL, TP, BILIT, AG,  AGAP  Recent CARDIAC ENZYMES No results for input(s): TROPONIN, ISTATTROPONI, CK, CKMB in the last 24 hours.    Invalid input(s): TROPONINT  Recent TSH No results for input(s): TSH in the last 24 hours.  Recent URIC ACID Invalid input(s): URIC  Recent BLOOD CULTURE No results for input(s): CXBLD in the last 24 hours.  Recent URINE CULTURE Invalid input(s): CXURN  Recent OCCULT BLOOD Invalid input(s): OCCBL  Recent PT/PTT No results for input(s): INR, PTT in the last 24 hours.    Invalid input(s): PTI, COUM, COUMP, ACOAG, ACOAP  Recent PT/INR No results for input(s): INR in the  last 24 hours.    Invalid input(s): PTI, COUM, COUMP  Recent LIPID PANEL No results for input(s): CHOL, TRIG, HDL in the last 24 hours.    Invalid input(s): LDLC, VLDLC, LRAT  Recent ABG No results for input(s): TEMP, FIO2, RATE, MODE, ETCO2, PEEP in the last 24 hours.    Invalid input(s): APH, APCO2, APO2, AHCO3, ATCO2, ABE, AOSAT, ABGS, ALLEN, STATS, O2DEL, O2FLO, PRESS, VNTMN, PRSUP, TIVOL      Rads:   Radiological Procedure reviewed.    Signed by: Valerie Roys, MD, [facility]

## 2017-02-18 ENCOUNTER — Encounter
Admission: EM | Disposition: A | Discharge status: Home / Self Care | Payer: Self-pay | Source: Home / Self Care | Attending: Specialist

## 2017-02-18 LAB — CBC AND DIFFERENTIAL
Absolute NRBC: 0 10*3/uL
Basophils Absolute Automated: 0.03 10*3/uL (ref 0.00–0.20)
Basophils Automated: 0.4 %
Eosinophils Absolute Automated: 0.29 10*3/uL (ref 0.00–0.70)
Eosinophils Automated: 3.7 %
Hematocrit: 28.5 % — ABNORMAL LOW (ref 42.0–52.0)
Hgb: 8.2 g/dL — ABNORMAL LOW (ref 13.0–17.0)
Immature Granulocytes Absolute: 0.04 10*3/uL
Immature Granulocytes: 0.5 %
Lymphocytes Absolute Automated: 0.65 10*3/uL (ref 0.50–4.40)
Lymphocytes Automated: 8.4 %
MCH: 25.3 pg — ABNORMAL LOW (ref 28.0–32.0)
MCHC: 28.8 g/dL — ABNORMAL LOW (ref 32.0–36.0)
MCV: 88 fL (ref 80.0–100.0)
MPV: 10.8 fL (ref 9.4–12.3)
Monocytes Absolute Automated: 1.11 10*3/uL (ref 0.00–1.20)
Monocytes: 14.3 %
Neutrophils Absolute: 5.63 10*3/uL (ref 1.80–8.10)
Neutrophils: 72.7 %
Nucleated RBC: 0 /100 WBC (ref 0.0–1.0)
Platelets: 230 10*3/uL (ref 140–400)
RBC: 3.24 10*6/uL — ABNORMAL LOW (ref 4.70–6.00)
RDW: 15 % (ref 12–15)
WBC: 7.75 10*3/uL (ref 3.50–10.80)

## 2017-02-18 LAB — GFR: EGFR: 57.2

## 2017-02-18 LAB — COMPREHENSIVE METABOLIC PANEL
ALT: 14 U/L (ref 0–55)
AST (SGOT): 13 U/L (ref 5–34)
Albumin/Globulin Ratio: 1 (ref 0.9–2.2)
Albumin: 2.7 g/dL — ABNORMAL LOW (ref 3.5–5.0)
Alkaline Phosphatase: 69 U/L (ref 38–106)
BUN: 33 mg/dL — ABNORMAL HIGH (ref 9.0–28.0)
Bilirubin, Total: 0.4 mg/dL (ref 0.2–1.2)
CO2: 21 mEq/L — ABNORMAL LOW (ref 22–29)
Calcium: 8.3 mg/dL (ref 7.9–10.2)
Chloride: 111 mEq/L (ref 100–111)
Creatinine: 1.2 mg/dL (ref 0.7–1.3)
Globulin: 2.7 g/dL (ref 2.0–3.6)
Glucose: 97 mg/dL (ref 70–100)
Potassium: 4.3 mEq/L (ref 3.5–5.1)
Protein, Total: 5.4 g/dL — ABNORMAL LOW (ref 6.0–8.3)
Sodium: 140 mEq/L (ref 136–145)

## 2017-02-18 SURGERY — LHC W/ CORONARY ANGIOS AND LV
Anesthesia: Conscious Sedation | Laterality: Left

## 2017-02-18 MED ORDER — NITROGLYCERIN IN D5W 200-5 MCG/ML-% IV SOLN
INTRAVENOUS | Status: AC
Start: 2017-02-18 — End: 2017-02-18
  Administered 2017-02-18: 08:00:00 200 ug
  Filled 2017-02-18: qty 250

## 2017-02-18 MED ORDER — MIDAZOLAM HCL 2 MG/2ML IJ SOLN
INTRAMUSCULAR | Status: AC
Start: 2017-02-18 — End: 2017-02-18
  Administered 2017-02-18: 07:00:00 1 mg via INTRAVENOUS
  Filled 2017-02-18: qty 2

## 2017-02-18 MED ORDER — HEPARIN SODIUM (PORCINE) 1000 UNIT/ML IJ SOLN
INTRAMUSCULAR | Status: AC
Start: 2017-02-18 — End: 2017-02-18
  Administered 2017-02-18: 08:00:00 5000 [IU] via INTRAVENOUS
  Filled 2017-02-18: qty 10

## 2017-02-18 MED ORDER — IODIXANOL 320 MG/ML IV SOLN
60.00 mL | Freq: Once | INTRAVENOUS | Status: AC
Start: 2017-02-18 — End: 2017-02-18
  Administered 2017-02-18: 08:00:00 60 mL via INTRA_ARTERIAL

## 2017-02-18 MED ORDER — PANTOPRAZOLE SODIUM 40 MG PO TBEC
40.00 mg | DELAYED_RELEASE_TABLET | Freq: Two times a day (BID) | ORAL | 1 refills | Status: DC
Start: 2017-02-18 — End: 2017-02-23

## 2017-02-18 MED ORDER — VERAPAMIL HCL 2.5 MG/ML IV SOLN
INTRAVENOUS | Status: AC
Start: 2017-02-18 — End: 2017-02-18
  Administered 2017-02-18: 08:00:00 2.5 mg via INTRA_ARTERIAL
  Filled 2017-02-18: qty 2

## 2017-02-18 MED ORDER — FENTANYL CITRATE (PF) 50 MCG/ML IJ SOLN (WRAP)
INTRAMUSCULAR | Status: AC
Start: 2017-02-18 — End: 2017-02-18
  Administered 2017-02-18: 07:00:00 25 ug via INTRAVENOUS
  Filled 2017-02-18: qty 2

## 2017-02-18 MED ORDER — LISINOPRIL 10 MG PO TABS
10.00 mg | ORAL_TABLET | Freq: Two times a day (BID) | ORAL | 0 refills | Status: DC
Start: 2017-02-18 — End: 2017-02-23

## 2017-02-18 MED ORDER — HEPARIN WASH BOWL 5 UNITS/ML SOLN (CATH LAB)
Status: AC
Start: 2017-02-18 — End: 2017-02-18
  Filled 2017-02-18: qty 2000

## 2017-02-18 MED ORDER — CARVEDILOL 3.125 MG PO TABS
3.1250 mg | ORAL_TABLET | Freq: Two times a day (BID) | ORAL | 0 refills | Status: DC
Start: 2017-02-18 — End: 2017-02-23

## 2017-02-18 MED ORDER — LIDOCAINE HCL (PF) 1 % IJ SOLN
INTRAMUSCULAR | Status: AC
Start: 2017-02-18 — End: 2017-02-18
  Administered 2017-02-18: 07:00:00 5 mL via SUBCUTANEOUS
  Filled 2017-02-18: qty 30

## 2017-02-18 NOTE — H&P (Signed)
H&P Update with ASA/MALLAMPATTI     Date Time: 02/18/17 6:54 AM    PROCEDURE   Left heart cath with possible PCI  MALLAMPATTI AIRWAY CLASSIFICATION (II)      CLASS I- Full visibility of tonsils, uvula, and soft palate     CLASS II- Visibility of hard and soft palate, upper portion of tonsils and uvula  CLASS III-Soft and hard palate and base of uvula are visible    CLASS IV- Only hard palate visible    ASA PHYSICAL STATUS (3)     ASA 1   HEALTHY PATIENT  ASA 2   MILD SYSTEMIC ILLNESS  ASA 3   SYSTEMIC DISEASE, NOT INCAPACITATING  ASA 4   SEVERE SYSTEMIC DISEASE, IS CONSTANT THREAT TO LIFE  ASA 5   MORIBUND CONDITION, NOT EXPECTED TO LIVE >24 HOURS           IRRESPECTIVE OF PROCEDURE  E           EMERGENCY PROCEDURE   CONCLUSION:     The history and physical currently available in the chart has been reviewed and there are no significant interval changes since prior evaluation.  He has no complaints.  He was seen and examined by me prior to the procedure.  The risks, benefits and alternatives of the procedure have been discussed in detail and he has indicated that he understands the procedure, indications, and risks inherent to the procedure and is amenable to proceeding.  All questions were answered.  Informed consent was signed and verified.      Bleeding Risk Score   Acute STEMI           No  byHashemi,SeyedM,MD  at11/30/180653  No  byHashemi,SeyedM,MD  at11/30/180653    Previous PCI           No  byHashemi,SeyedM,MD  at11/30/180653  No  byHashemi,SeyedM,MD  at11/30/180653    PCI Status           Elective  byHashemi,SeyedM,MD  at11/30/180653  Elective  byHashemi,SeyedM,MD  at11/30/180653    Chronic Kidney Disease           No  byHashemi,SeyedM,MD  at11/30/180653  No  byHashemi,SeyedM,MD  at11/30/180653    Cardiac Arrest within 24 hour           No  byHashemi,SeyedM,MD  at11/30/180653  No  byHashemi,SeyedM,MD  at11/30/180653    Shock           No   byHashemi,SeyedM,MD  at11/30/180653  No  byHashemi,SeyedM,MD  at11/30/180653    Age Category           greater than 70 years (calculated)  byHashemi,SeyedM,MD  at11/30/180653  greater than 70 years (calculated)  byHashemi,SeyedM,MD  at11/30/180653    Gender Category           Male (calculated)  byHashemi,SeyedM,MD  at11/30/180653  Male (calculated)  byHashemi,SeyedM,MD  at11/30/180653    BMI (calculated)             21.5 (calculated)  byDelosSantos,Ailyn,RN  at11/21/182311    BMI Category           20-30  byHashemi,SeyedM,MD  at11/30/180653  20-30  byHashemi,SeyedM,MD  at11/30/180653    Hemoglobin g/dL (read only)   8.3  byIAUTO  at11/27/180341   Abnormal      8.4  byIAUTO  at11/27/181602   Abnormal      7.9  byIAUTO  at11/28/180401   Abnormal      8.3         8.2  byIAUTO  at11/30/180343   Abnormal        8.2  byIAUTO  at11/30/180343   Abnormal      Hemoglobin Category           less than 13.0  byHashemi,SeyedM,MD  at11/30/180653  less than 13.0  byHashemi,SeyedM,MD  at11/30/180653    Total Score           35 = INTERMEDIATE RISK for bleeding (1.1% to 3.1%) (calculated)   35 = INTERMEDIATE RISK for bleeding (1.1% to 3.1%) (calculated)               Signed by Valerie Roys.

## 2017-02-18 NOTE — Progress Notes (Signed)
Pt remains stable, assisted oob ambulated in the halls and voided, incision site CDI, no bleeding/hematoma, Discharge instruction reviewed with patient. Pt was discharged back to his room in  [facility].

## 2017-02-18 NOTE — Discharge Instr - AVS First Page (Addendum)
Reason for your Hospital Admission:  GI bleed      Instructions for after your discharge:  Follow up with cardiology and GI     Interventional Cardiovascular Admission and Recovery  Catheterization Discharge Instructions            Access Site: Radial Artery    Activity:  1. Do not lift anything greater than five (5) pounds in weight and no strenuous activity for 72 hours.  2. Avoid weight bearing in the affected arm and prevent flexion, extension and manipulation of the wrist area for at least 24 hours.   3. No driving for 48 hours following your procedure.  4. Ask your doctor when you should return to work. Usually you can return within two (2) to three (3) days for desk jobs and within five (5) to seven (7) days for jobs requiring heavy labor.   5. Drink 6-8 glasses of water for at least the next two (2) days to help flush your body of the contrast used during the procedure.    Access Site Care:  1. You may shower 24 hours after your procedure.  Leave the bandage in place and let the water passively flow over the site.  You may shower daily.   2. After 48 hours REMOVE the dressing before or during your shower.  Again, let the water passively flow over the site, wash gently with mild soap and water using your hand, then pat the area dry.    3. Do not submerge access site in water (dishwashing, tub bath, pool, etc) until completely healed (usually five (5) days).   4. Do not rub, pick or scratch the area.   5. Do not apply creams, powders, lotions, or ointments to the site.   6. Apply a regular sized Band-Aid to the puncture site for five (5) days.  Change it daily.   7. Observe for signs of infection:  redness, warmth, swelling, drainage, or temperature greater than 100 degrees F.  If you suspect infection call the doctor who performed the procedure.  8. Monitor for bleeding and swelling. If either occurs, sit or lie down, apply manual pressure directly over the access site for twenty (20) minutes and call the  doctor who performed the procedure.     Normal Observation:  1. You may feel tenderness at the puncture site.  May take ACETAMINOPHEN (TYLENOL) if needed.  May also use ice and elevation for site discomfort.  2. You may experience some mild bruising.      Call 911 if:  1. If you are unable to stop the bleeding with manual pressure. An arterial bleed may become an emergency if left unattended.   2. Your fingers/hand/arm has a loss of normal sensation, becomes cold, numb, painful or grayish in color.   3. You are experiencing unrelieved chest pain.              Interventional Cardiovascular Admission and Recovery                Catheterization Discharge Instructions                                         Groin Access      Activity:  1. No driving for 24 hours following the procedure due to medications you may have received. Avoid alcohol for the next 24 hours.  2. Rest today  and tomorrow, gradually increasing to your usual activities.  3. Limit stair usage for the next 24 hours. If you must use the stairs, take them one at a time, leading with your unaffected leg holding pressure on the bandaged site.  4. Do not lift anything over 10 pounds in weight for five (5) days. That includes pushing, pulling, dragging or moving anything.  5. No strenuous activity for five (5) days. Do not attempt anything that may cause fatigue, shortness of breath, perspiration or chest pain.   6. Support the bandaged site when coughing or sneezing. Do not strain when having a bowel movement.   7. Drink 6-8 glasses of water daily for at least 48 hours to help flush your body of the dye used during the procedure.    Access Site Care:  1. No tub baths, hot tubs, pools or sitting in water for one week.  2. You may shower 24 hours after your procedure. Leave the bandage in place and let the water passively flow over the site.  3. REMOVE the dressing 48 hours after your procedure, either before or during your shower. Again, let the water  passively flow over the site, wash gently with your hand, then pat the area dry.   4. Do not rub, pick or scratch the area.   5. Do not apply creams, powders, lotions, or ointments to the site.   6. Apply a regular sized Band-Aid to the puncture site and change it daily for five (5) days. You may shower daily.  7. Observe for signs of infection:  redness, warmth, swelling, drainage, or temperature greater than 100 degrees F.  If you suspect infection call the doctor who performed the procedure.    Normal Observation:  1. You may feel tenderness. May take ACETAMINOPHEN (TYLENOL) if needed.  2. You may experience some mild bruising.   3.  IF a closure device was used, you may feel a small lump, about the size of an olive pit, which should disappear within 90 days    Call 911 if:  1. You are experiencing unrelieved chest pain.  2. You notice bleeding either through the dressing or underneath the skin. If the blood is trapped under the skin, the area will hurt, become swollen and hard. If either happens, lay down flat and hold pressure on the site. This is an arterial bleed, and may become an emergency if unattended.   3. Your leg becomes cold, numb, painful, grayish in color, or change from usual color/sensation.

## 2017-02-18 NOTE — Discharge Summary (Addendum)
DISCHARGE NOTE    Date Time: 02/18/17 8:32 AM  Patient Name: Hillier,Rual E  Attending Physician: Schuyler Amor, MD    Date of Admission:   02/09/2017    Date of Discharge:   02/23/17    Reason for Admission:   Hypotension, unspecified hypotension type [I95.9]  Gastrointestinal hemorrhage, unspecified gastrointestinal hemorrhage type [K92.2]  GI bleed [K92.2]    Problems:   Lists the present on admission hospital problems  Present on Admission:  . GI bleed      Hospital Problems:  Principal Problem:    Anemia  Active Problems:    GI bleed      Problem Lists:  Patient Active Problem List   Diagnosis   . GI bleed   . Anemia              Discharge Dx:   Acute blood loss symptomatic anemia   Tachycardia   Hypotension   Chronic ulcerative colitis   AKI multifactorial   S/p colonoscopy/EGD with multiple AVM   New onset atrial fibrillation   Acute on chronic systolic HFrEF  Severe 3V CAD s/p RHC/LHC   Consultations:   GI  Cardiology     Procedures performed:   Colonoscopy/EGD  Cardiac cath   PCI DES to LM/Lcx   Hospital Course:   CHAROD SLAWINSKI is a 83 y.o. male who presents to the hospital with abnormal labs with severe anemia in ED he was hypotensive seen by [facility] for evaluation of admission to ICU however his repeat BP showed improvement and he is  admitted to [facility]   He c/o feeling weak and dizziness   Denies any abdominal pain , nausea vomiting   No hematemesis melena or hematochezia  No hemoptysis, cough or fever   No cp or SOB   Rest of ROS  negative    Patient was seen by me in the ED last night however he became hypotensive and tachycardiac and [facility] was consulted for possible ICU admission   Admitted to [facility] received PRBC stabilized and transferred to medical floor tele   Seen by GI egd and colonoscopy done showed multiple AVM   PPI was started   Cardiology consulted for Afib echo showed low EF   RHC/LHC was done showed severe CAD recommendation for CABG  H/h stable and no further bleeding   He underwent PCI with DES  to LM and LCX without  any complications   Discharged home on plavix and aspirin     Discharge Medications:     Current Discharge Medication List      START taking these medications    Details   carvedilol (COREG) 3.125 MG tablet Take 1 tablet (3.125 mg total) by mouth every 12 (twelve) hours.  Qty: 60 tablet, Refills: 0      pantoprazole (PROTONIX) 40 MG tablet Take 1 tablet (40 mg total) by mouth 2 (two) times daily.for 3 days  Qty: 6 tablet, Refills: 1         CONTINUE these medications which have CHANGED    Details   lisinopril (PRINIVIL,ZESTRIL) 10 MG tablet Take 1 tablet (10 mg total) by mouth every 12 (twelve) hours.  Qty: 60 tablet, Refills: 0         CONTINUE these medications which have NOT CHANGED    Details   IRON PO Take 200 mg by mouth daily.               Discharge Instructions:  Follow-up with PCP, cardiology , GI       Signed by: Schuyler Amor MD  [phone]

## 2017-02-18 NOTE — Progress Notes (Addendum)
Medicine Shift Note    Patient Lines/Drains/Airways Status    Active Lines, Drains and Airways     Name:   Placement date:   Placement time:   Site:   Days:    Peripheral IV 02/09/17 Left Hand  02/09/17    1805    Hand    8    Peripheral IV 02/18/17 Left Antecubital  02/18/17    1153    Antecubital    less than 1                Last BM: 11/30    Pending Orders: na    Discharge Plan: home     POC: Patient and daughter    Skin: intact    Tele: yes    Activity: standby    Interpreter Needs: no, Used: NO, Method: na Waiver Need/present: na    Shift Note: Patient back to unit  From  Cardiac catheterization dressing to right hand clean, dry and intact. Denies any pain and no complain of shortness of breath at the moment. Monitor for any changes.

## 2017-02-18 NOTE — Progress Notes (Signed)
Patient admitted for a gastrointestinal bleed    Case Manager met with patient. He states he lives alone but his daughter will be staying with him. Patient is independent with ADL's and needs assistance with transportation. Patient states he will have assistance from his daughter with other IADLS that he may have difficulty with post discharge.     No recommendations from PT/OT for DME or home health    Expected Discharge     Home with Family      Arnetha Courser RN [facility]  Case Manager  [phone]       02/18/17 1349   Patient Type   Within 30 Days of Previous Admission? No   Healthcare Decisions   Interviewed: Patient   Orientation/Decision Making Abilities of Patient Alert and Oriented x3, able to make decisions   Advance Directive Patient does not have advance directive   Healthcare Agent Appointed Yes   Healthcare Agent's Name SHRIYAN, ARAKAWA [phone]   Healthcare Agent's Phone Number Daughter   Prior to admission   Prior level of function Independent with ADLs;Ambulates independently   Type of Residence Private residence   Home Layout Two level;Able to live on main level with bedroom/bathroom   Have running water, electricity, heat, etc? Yes   Living Arrangements Children   How do you get to your MD appointments? Daughter [place]   How do you get your groceries? Daughter [place]   Who fixes your meals? Daughter [place]   Who does your laundry? Daughter [place]ston [place]   Who picks up your prescriptions? Daughter Astronomer Independent   Toileting Independent   Adult Management consultant (APS) involved? No   Discharge Planning   Support Systems Children   Patient expects to be discharged to: Home with Family   Anticipated [place] plan discussed with: Same as interviewed   [place] discussion contact information: Patient or daughter   Mode of transportation: Private car (family member)   Consults/Providers   PT Evaluation Needed 1   OT Evalulation Needed 1   SLP Evaluation  Needed 2   Correct PCP listed in Epic? Yes   Important Message from [facility] Notice   Patient received 1st IMM Letter? Yes   Date of most recent IMM given: 02/18/17

## 2017-02-18 NOTE — Procedures (Signed)
[facility]   Moo Gravley M. Lucianne Muss, MD, [facility]  PROGRESS NOTE  Office: [phone]          Pager/Answering Service: [phone]        Cell phone [phone]            Indication:  Cardiomyopathy/CHF    Procedures:     Right heart catheterization   Left heart catheterization   Left ventriculography   Coronary angiography    Description:   Risks, benefits, and alternatives were explained to the patient and informed consent was obtained. The patient was brought to the cath lab and was prepped and draped in the normal sterile technique. The right radial artery was cannulated and a 5 french arterial sheath inserted.  Right internal jugular vein was accessed using a micro puncture kit and ultrasound guided technique. A 19F venous sheath was inserted. We used Theone Murdoch catheter for right heart catheterization and all the proper measurements were obtained. We used  standard catheters for coronary angiography, left ventriculography and left heart catheterization. Orthogonal views of left and right coronary arteries were obtained in caudal and cranial positions. The aortic valve was crossed easily using pigtail catheter over the wire and LV was entered. LVEDP was measured. Gradient across AV was measured using pullback method. At the end of the procedure the catheters were withdrawn and the sheaths were removed.  Hemostasis was obtained using manual pressure at the IJ site and TR band on the right wrist and the patient was moved to the floor in stable condition.  There were no complications.    Right Heart Catheterization:     Right atrial pressure: 8 mmHg   Right ventricular pressure: 41/10 mmHg   Pulmonary artery pressure:  41/15/22 mmHg   Pulmonary capillary wedge pressure:  14 mmHg   Cardiac output/ cardiac index by Fick 4.1/2.5    Left Heart Catheterization:     Aortic pressure:  105/48/66 mmHg.    LV pressure 105/35 mmHg.    Left ventricular end diastolic pressure: 35 mmHg.   There was no  significant gradient across AV     Left Ventriculography:    Severely reduced LV systolic function; LVEF: 20%   Severe global hypokinesis   1+ mitral regurgitation    Coronary Angiography:     LM: 100% occluded distally.    LAD: 100% occluded at ostium.     LCx: Non-dominant vessel and 100% occluded at ostium.   RCA: Large dominant vessel giving rise to RPDA and RPLB and feeding the left coronary arteries via collaterals. RCA has mild diffuse disease w/o focal stenosis. RPLB is a large branching vessel w/o any significant stenosis. RPDA is a medium size vessel and is subtotally occluded in mid segment.     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    1+ mitral regurgitation    Plan:     Needs revascularization by CABG   No need for any valvular procedures as his MR is only 1+ and he has no significant AS.    Medical therapy for ischemic cardiomyopathy/CHF

## 2017-02-18 NOTE — Plan of Care (Signed)
Problem: Inadequate Tissue Perfusion  Goal: Adequate tissue perfusion will be maintained  Outcome: Progressing   02/18/17 1549   Goal/Interventions addressed this shift   Adequate tissue perfusion will be maintained Monitor/assess vital signs;Monitor/assess lab values and report abnormal values;Monitor/assess neurovascular status (pulses, capillary refill, pain, paresthesia, paralysis, presence of edema);Monitor intake and output       Problem: Impaired Mobility  Goal: Mobility/Activity is maintained at optimal level for patient  Outcome: Progressing   02/18/17 1549   Goal/Interventions addressed this shift   Mobility/activity is maintained at optimal level for patient Increase mobility as tolerated/progressive mobility;Perform active/passive ROM;Plan activities to conserve energy, plan rest periods

## 2017-02-18 NOTE — Progress Notes (Addendum)
Patient off the unit for heart catheterization per unit on- going unit.

## 2017-02-18 NOTE — Progress Notes (Signed)
Received patient into ICAR room 9 to pre-op for LHC. Patient ID, allergies, and procedure verified. Patient connected to telemetry monitor. NPO status confirmed. Alert and oriented x4. Patient ready for procedure. Oriented to room, call light within reach, bed in lowest position.  Reviewed fall precautions and when to call for help; patient verbalizes understanding. Family at the bedside and has tracking sheet information, for which she verbalizes understanding.    Pre- Cath Teaching and Learning Objectives   Learner: [name]   Preference for learning: Verbal  Teaching Method: Verbal Instruction   Outcome of Learning: verbalized understanding    Described/Demonstrated the following:     + Responsibilities of patient's care  + Cardiac Cath/PTCA/Stent  + Purpose of procedure  + Need to be NPO pre-procedure  + Need for maintaining bedrest & straight leg vs arm restriction post-procedure & sheath removal.  + Necessary fluid intake after procedure  + Symptoms of bleeding & states plan to notify nurse.

## 2017-02-18 NOTE — Consults (Signed)
CARDIAC SURGERY CONSULTATION    Date Time: 02/18/17 10:57 AM  Patient Name: [name],[name]  Attending Physician: Schuyler Amor, MD  Primary Care Physician: Philipp Ovens, MD    Reason for Consult: CABG Eval   Assessment:   Multivessel CAD  Reduced Ejection fraction  CHF exacerbation (systolic)  PAF, currently in SR, no AC r/t GI bleed  VT--asymptomatic   HTN  GI bleed  Hx of Ulcerative colitis  Anemia s/p blood transfusion      A cardiac surgery consultation has been requested for Mr. [name] a 83 y.o caucasian male who underwent cardiac catherization and was found to have multivessel CAD w/ reduced ejection fraction.    Plan:   Pt is at an elevated risk for cardiac surgery given STS risk score. He also has a hx of recent GI bleeding, would require a large dose of anticoagulation while on CPB machine.  Met with pt and daughter and Elza Rafter and these findings were discussed. At this time not a surgical candidate recommend medical management and/or PCI if deemed a candidate  History of Presenting Illness:   [name] is a 83 y.o. male who presents to the hospital with symptomatic anemia on 02/09/2017, HCT 24.7 he was transfused. GI consult obtained and he underwent EGD/Colonoscopy, found to have bleeding in cecum which was treated w/ argon beam. He has prior history of AVMS.    During hospitalization he developed PAF, and had some VT, cardiology consulted. He then had an echocardiogram w/ reduced ejection fraction and Mod-severe MR. He underwent cardiac catherization today and was found to have multivessel CAD, and we were consulted for surgical revascularization.    Preliminary STS Risk Score  Mortality 9.789%  Morbidity and Mortality 29.394%  Past Medical History:     Past Medical History:   Diagnosis Date   . Anemia     iron low   . DOE (dyspnea on exertion)     mild   . Glaucoma NEC    . Hypertensive disorder    . Skin cancer     2006 removed mole   . Ulcerative colitis, chronic    . Vision abnormalities         Past Surgical History:     Past Surgical History:   Procedure Laterality Date   . EGD, COLONOSCOPY  04/03/2012    Procedure: EGD, COLONOSCOPY;  Surgeon: Lestine Mount, MD;  Location: [facility];  Service: Gastroenterology;  Laterality: N/A;   . EGD, COLONOSCOPY N/A 02/11/2017    Procedure: EGD, COLONOSCOPY;  Surgeon: Lestine Mount, MD;  Location: [id number];  Service: Gastroenterology;  Laterality: N/A;   . EYE SURGERY      cataract     Family History:   History reviewed. No pertinent family history.    Social History:     History   Smoking Status   . Former Smoker   . Packs/day: 1.00   . Years: 12.00   . Quit date: 12/18/1961   Smokeless Tobacco   . Never Used     History   Alcohol Use   . 0.0 oz/week     Comment: 0.5oz/d     History   Drug Use No     Allergies:   No Known Allergies  Medications:     Current Discharge Medication List      CONTINUE these medications which have NOT CHANGED    Details   IRON PO Take 200 mg by mouth daily.  Review of Systems:   All other systems were reviewed and are negative   Physical Exam:   Patient Vitals for the past 24 hrs:   BP Temp Temp src Pulse Resp SpO2   02/18/17 1000 111/56 - - 71 - 97 %   02/18/17 0930 100/54 - - 70 16 97 %   02/18/17 0915 104/55 - - 72 16 99 %   02/18/17 0900 106/58 - - 77 16 99 %   02/18/17 0845 101/57 - - 66 16 98 %   02/18/17 0830 - 98.3 F (36.8 C) Oral 67 16 98 %   02/18/17 0603 119/61 98.7 F (37.1 C) Oral 68 18 98 %   02/18/17 0339 120/70 98.4 F (36.9 C) Oral 72 - 97 %   02/18/17 0000 144/70 98.4 F (36.9 C) Oral - - 97 %   02/17/17 2000 115/67 98.6 F (37 C) Oral 74 18 100 %   02/17/17 1618 113/63 98.8 F (37.1 C) Oral 63 18 96 %   02/17/17 1100 104/64 98.1 F (36.7 C) Oral 69 16 99 %     Body mass index is 20.82 kg/m.    Intake/Output Summary (Last 24 hours) at 02/18/17 1057  Last data filed at 02/18/17 0300   Gross per 24 hour   Intake              780 ml   Output              700 ml   Net               80 ml      General: awake, alert, oriented x 3; no acute distress.  HEENT: perrla, eomi, sclera anicteric  oropharynx clear without lesions, mucous membranes moist  Neck: supple, no lymphadenopathy, no thyromegaly, no JVD, no carotid bruits  Cardiovascular: regular rate and rhythm, no murmurs, rubs or gallops  Lungs: clear to auscultation bilaterally, without wheezing, rhonchi, or rales  Abdomen: soft, non-tender, non-distended; no palpable masses, no hepatosplenomegaly, normoactive bowel sounds, no rebound or guarding  Extremities: no clubbing, cyanosis, or edema  Neuro: cranial nerves grossly intact, strength 5/5 in upper and lower extremities, sensation intact,   Skin: no rashes or lesions noted  Labs:     Results     Procedure Component Value Units Date/Time    Comprehensive metabolic panel [[id number]]  (Abnormal) Collected:  02/18/17 0343    Specimen:  Blood Updated:  02/18/17 0502     Glucose 97 mg/dL      BUN 40.9 (H) mg/dL      Creatinine 1.2 mg/dL      Sodium 811 mEq/L      Potassium 4.3 mEq/L      Chloride 111 mEq/L      CO2 21 (L) mEq/L      Calcium 8.3 mg/dL      Protein, Total 5.4 (L) g/dL      Albumin 2.7 (L) g/dL      AST (SGOT) 13 U/L      ALT 14 U/L      Alkaline Phosphatase 69 U/L      Bilirubin, Total 0.4 mg/dL      Globulin 2.7 g/dL      Albumin/Globulin Ratio 1.0    GFR [[id number]] Collected:  02/18/17 0343     Updated:  02/18/17 0502     EGFR 57.2    CBC and differential [[id number]]  (Abnormal) Collected:  02/18/17 0343    Specimen:  Blood from Blood Updated:  02/18/17 0412     WBC 7.75 x10 3/uL      Hgb 8.2 (L) g/dL      Hematocrit 60.4 (L) %      Platelets 230 x10 3/uL      RBC 3.24 (L) x10 6/uL      MCV 88.0 fL      MCH 25.3 (L) pg      MCHC 28.8 (L) g/dL      RDW 15 %      MPV 10.8 fL      Neutrophils 72.7 %      Lymphocytes Automated 8.4 %      Monocytes 14.3 %      Eosinophils Automated 3.7 %      Basophils Automated 0.4 %      Immature Granulocyte 0.5 %      Nucleated RBC 0.0 /100 WBC       Neutrophils Absolute 5.63 x10 3/uL      Abs Lymph Automated 0.65 x10 3/uL      Abs Mono Automated 1.11 x10 3/uL      Abs Eos Automated 0.29 x10 3/uL      Absolute Baso Automated 0.03 x10 3/uL      Absolute Immature Granulocyte 0.04 x10 3/uL      Absolute NRBC 0.00 x10 3/uL       Imaging personally reviewed, including: Cardiac cath films and echocardiogram  Above discussed w/ Dr Alysia Penna  Signed by: Bentlee Benningfield Ainsley Spinner, ACNP  CARDIAC SURGERY NP  [phone] or [phone]

## 2017-02-18 NOTE — Progress Notes (Signed)
Medicine Shift Note    Patient Lines/Drains/Airways Status    Active Lines, Drains and Airways     Name:   Placement date:   Placement time:   Site:   Days:    Peripheral IV 02/09/17 Left Hand  02/09/17    1805    Hand    8    Peripheral IV 02/12/17 Right Wrist  02/12/17    1500    Wrist    5                Last BM:11/29  Pending Orders: Cardiac lab  Discharge Plan:home  POC: Pt/daughter  Skin: intact  Tele:SR with PVC's SVT  Activity: standby  Interpreter Needs:No    Shift Note: see CP

## 2017-02-18 NOTE — Progress Notes (Signed)
CATH LAB PROCEDURE HANDOFF REPORT    Date Time: 02/18/17 8:04 AM    INDICATIONS:    See MD notes  PROCEDURE:    Left and right heart cath  ALLERGIES:    Patient has no known allergies.   ACC BLEEDING RISK SCORE   Total Score: 35 = INTERMEDIATE RISK for bleeding (1.1%  to 3.1%)   MEDICAL HISTORY:      Past Medical History:   Diagnosis Date   . Anemia     iron low   . DOE (dyspnea on exertion)     mild   . Glaucoma NEC    . Hypertensive disorder    . Skin cancer     2006 removed mole   . Ulcerative colitis, chronic    . Vision abnormalities       ACCESS:    60F sheath in right radial artery; 7 Fr. right internal jugular w/ manual hemostasis  Hemostasis: TR band, 12 cc of air RRA  Post procedure pulses: 2+ in right arm/hand  Visual appearance: clean/dry/intact with good distal pulses   MEDICATIONS:    Versed: 1 mg IV  Fentanyl: 25 mcg IV  Heparin:  5000 units IV  Verapamil: 2.5 mg IA  Nitro: 200 mcg IA  VITALS:    BP: 91/50           Pulse:64          O2 SAT: 95%       Rhythm: sinus rhythm  PROCEDURE DETAILS:    Outcomes: 3 vessel disease/surgical consult    Complications: none    Final Chest Pain Assessment::0/10    Report given to: [place], RN      See Physicians Op note/ Report for details2

## 2017-02-18 NOTE — Plan of Care (Signed)
Problem: Moderate/High Fall Risk Score >5  Goal: Patient will remain free of falls  Outcome: Progressing   02/18/17 0347   OTHER   Moderate Risk (6-13) MOD-Consider activation of bed alarm if appropriate   High (Greater than 13) LOW-(VH Only) Continuous video monitoring       Problem: Altered GI Function  Goal: Fluid and electrolyte balance are achieved/maintained  Outcome: Progressing   02/17/17 0831   Goal/Interventions addressed this shift   Fluid and electrolyte balance are achieved/maintained Monitor/assess lab values and report abnormal values;Assess and reassess fluid and electrolyte status;Monitor intake and output every shift       Problem: Hemodynamic Status: Cardiac  Goal: Stable vital signs and fluid balance  Outcome: Progressing   02/18/17 0347   Goal/Interventions addressed this shift   Stable vital signs and fluid balance Assess signs and symptoms associated with cardiac rhythm changes;Monitor/assess vital signs and telemetry per unit protocol       Problem: Inadequate Tissue Perfusion  Goal: Adequate tissue perfusion will be maintained  Outcome: Progressing      Problem: Ineffective Gas Exchange  Goal: Effective breathing pattern  Outcome: Progressing   02/16/17 1533   Goal/Interventions addressed this shift   Effective breathing pattern Maintain O2 saturation level per LIP order;Teach/reinforce use of ordered respiratory interventions (ie. CPAP, BiPAP, Incentive Spirometer, Acapella);Monitor for medication induced respiratory depression       Problem: Impaired Mobility  Goal: Mobility/Activity is maintained at optimal level for patient  Outcome: Progressing   02/16/17 1533   Goal/Interventions addressed this shift   Mobility/activity is maintained at optimal level for patient Increase mobility as tolerated/progressive mobility;Encourage independent activity per ability;Reposition patient every 2 hours and as needed unless able to reposition self;Plan activities to conserve energy, plan rest periods        Comments: AXOX4. Minimal assist.  VSS SR with occ runs of SVT / Trigeminies. No CP, no SOB  NPO for  Cardiac cath, CHG bath done.   Good UOP, no SS GIB.

## 2017-02-18 NOTE — Progress Notes (Signed)
Pt was transferred to Cath lab  at 0545.

## 2017-02-18 NOTE — Discharge Instructions (Signed)
Interventional Cardiovascular Admission and Recovery  Catheterization Discharge Instructions            Access Site: Radial Artery    Activity:  1. Do not lift anything greater than five (5) pounds in weight and no strenuous activity for 72 hours.  2. Avoid weight bearing in the affected arm and prevent flexion, extension and manipulation of the wrist area for at least 24 hours.   3. No driving for 48 hours following your procedure.  4. Ask your doctor when you should return to work. Usually you can return within two (2) to three (3) days for desk jobs and within five (5) to seven (7) days for jobs requiring heavy labor.   5. Drink 6-8 glasses of water for at least the next two (2) days to help flush your body of the contrast used during the procedure.    Access Site Care:  1. You may shower 24 hours after your procedure.  Leave the bandage in place and let the water passively flow over the site.  You may shower daily.   2. After 48 hours REMOVE the dressing before or during your shower.  Again, let the water passively flow over the site, wash gently with mild soap and water using your hand, then pat the area dry.    3. Do not submerge access site in water (dishwashing, tub bath, pool, etc) until completely healed (usually five (5) days).   4. Do not rub, pick or scratch the area.   5. Do not apply creams, powders, lotions, or ointments to the site.   6. Apply a regular sized Band-Aid to the puncture site for five (5) days.  Change it daily.   7. Observe for signs of infection:  redness, warmth, swelling, drainage, or temperature greater than 100 degrees F.  If you suspect infection call the doctor who performed the procedure.  8. Monitor for bleeding and swelling. If either occurs, sit or lie down, apply manual pressure directly over the access site for twenty (20) minutes and call the doctor who performed the procedure.     Normal Observation:  1. You may feel tenderness at the puncture site.  May take  ACETAMINOPHEN (TYLENOL) if needed.  May also use ice and elevation for site discomfort.  2. You may experience some mild bruising.      Call 911 if:  1. If you are unable to stop the bleeding with manual pressure. An arterial bleed may become an emergency if left unattended.   2. Your fingers/hand/arm has a loss of normal sensation, becomes cold, numb, painful or grayish in color.   3. You are experiencing unrelieved chest pain.

## 2017-02-18 NOTE — Progress Notes (Signed)
Pt arrived to [facility] alert, oriented in time, person and place. Pt has a R radial access with TR band with 12cc of air, splint in place. clean, dry and intact. RIJ access clean, dry and intact.No bleeding no hematoma. No pain. VSS. Pt was instructed in post cath precautions and he verbalized understanding. Family was called to the room.

## 2017-02-18 NOTE — Progress Notes (Signed)
PROGRESS NOTE    Date Time: 02/18/17 8:28 AM  Patient Name: [name],[name]  Attending Physician: Schuyler Amor, MD    Assessment:   Symptomatic anemia   Tachycardia   Hypotension   Chronic ulcerative colitis   AKI multifactorial   New onset atrial fibrillation   Acute on chronic systolic HFrEF  Plan:   Severe 3V disease recommend CAGB   D/w cardiology CV surgery evaluation   Transfer to CTU   Continue PPI   SCD for DVT prophylaxis   Case d/w patient and staff care coordinated   History of Present Illness:   [name] is a 83 y.o. male who presents to the hospital with abnormal labs with severe anemia in ED he was hypotensive seen by [facility] for evaluation of admission to ICU however his repeat BP showed improvement and he is  admitted to [facility]   He c/o feeling weak and dizziness   Denies any abdominal pain , nausea vomiting   No hematemesis melena or hematochezia  No hemoptysis, cough or fever   No cp or SOB   Rest of ROS  negative    Patient was seen by me in the ED last night however he became hypotensive and tachycardiac and [facility] was consulted for possible ICU admission   Past Medical History:     Past Medical History:   Diagnosis Date   . Anemia     iron low   . DOE (dyspnea on exertion)     mild   . Glaucoma NEC    . Hypertensive disorder    . Skin cancer     2006 removed mole   . Ulcerative colitis, chronic    . Vision abnormalities        Past Surgical History:     Past Surgical History:   Procedure Laterality Date   . EGD, COLONOSCOPY  04/03/2012    Procedure: EGD, COLONOSCOPY;  Surgeon: Lestine Mount, MD;  Location: [facility];  Service: Gastroenterology;  Laterality: N/A;   . EGD, COLONOSCOPY N/A 02/11/2017    Procedure: EGD, COLONOSCOPY;  Surgeon: Lestine Mount, MD;  Location: [facility];  Service: Gastroenterology;  Laterality: N/A;   . EYE SURGERY      cataract       Family History:   History reviewed. No pertinent family history.    Social History:     Social History     Social History   . Marital  status: Married     Spouse name: N/A   . Number of children: N/A   . Years of education: N/A     Social History Main Topics   . Smoking status: Former Smoker     Packs/day: 1.00     Years: 12.00     Quit date: 12/18/1961   . Smokeless tobacco: Never Used   . Alcohol use 0.0 oz/week      Comment: 0.5oz/d   . Drug use: No   . Sexual activity: Not on file     Other Topics Concern   . Not on file     Social History Narrative   . No narrative on file       Allergies:   No Known Allergies    Medications:     Prescriptions Prior to Admission   Medication Sig   . IRON PO Take 200 mg by mouth daily.   Marland Kitchen lisinopril (PRINIVIL,ZESTRIL) 5 MG tablet Take by mouth daily.       Review  of Systems:   A comprehensive review of systems was: per HPI     Physical Exam:     Vitals:    02/18/17 0603   BP: 119/61   Pulse: 68   Resp: 18   Temp: 98.7 F (37.1 C)   SpO2: 98%       Intake and Output Summary (Last 24 hours) at Date Time    Intake/Output Summary (Last 24 hours) at 02/18/17 4034  Last data filed at 02/18/17 0300   Gross per 24 hour   Intake              780 ml   Output              700 ml   Net               80 ml       General appearance - alert, well appearing, and in no distress  Mouth - mucous membranes moist, pharynx normal without lesions  Neck - supple, no significant adenopathy  Lymphatics - no palpable lymphadenopathy, no hepatosplenomegaly  Chest - clear to auscultation, no wheezes, rales or rhonchi, symmetric air entry  Heart - S1 and S2 normal, no murmurs noted, no gallops noted, no JVD, tachycardic   Abdomen - soft, nontender, nondistended, no masses or organomegaly  Neurological - alert, oriented, normal speech, no focal findings or movement disorder noted  Musculoskeletal - no joint tenderness, deformity or swelling  Extremities - peripheral pulses normal, no pedal edema, no clubbing or cyanosis  Skin - normal coloration and turgor, no rashes, no suspicious skin lesions noted         Labs:     Results     Procedure  Component Value Units Date/Time    Comprehensive metabolic panel [[id number]]  (Abnormal) Collected:  02/18/17 0343    Specimen:  Blood Updated:  02/18/17 0502     Glucose 97 mg/dL      BUN 75.6 (H) mg/dL      Creatinine 1.2 mg/dL      Sodium 433 mEq/L      Potassium 4.3 mEq/L      Chloride 111 mEq/L      CO2 21 (L) mEq/L      Calcium 8.3 mg/dL      Protein, Total 5.4 (L) g/dL      Albumin 2.7 (L) g/dL      AST (SGOT) 13 U/L      ALT 14 U/L      Alkaline Phosphatase 69 U/L      Bilirubin, Total 0.4 mg/dL      Globulin 2.7 g/dL      Albumin/Globulin Ratio 1.0    GFR [[id number]] Collected:  02/18/17 0343     Updated:  02/18/17 0502     EGFR 57.2    CBC and differential [[id number]]  (Abnormal) Collected:  02/18/17 0343    Specimen:  Blood from Blood Updated:  02/18/17 0412     WBC 7.75 x10 3/uL      Hgb 8.2 (L) g/dL      Hematocrit 09.3 (L) %      Platelets 230 x10 3/uL      RBC 3.24 (L) x10 6/uL      MCV 88.0 fL      MCH 25.3 (L) pg      MCHC 28.8 (L) g/dL      RDW 15 %      MPV 10.8 fL  Neutrophils 72.7 %      Lymphocytes Automated 8.4 %      Monocytes 14.3 %      Eosinophils Automated 3.7 %      Basophils Automated 0.4 %      Immature Granulocyte 0.5 %      Nucleated RBC 0.0 /100 WBC      Neutrophils Absolute 5.63 x10 3/uL      Abs Lymph Automated 0.65 x10 3/uL      Abs Mono Automated 1.11 x10 3/uL      Abs Eos Automated 0.29 x10 3/uL      Absolute Baso Automated 0.03 x10 3/uL      Absolute Immature Granulocyte 0.04 x10 3/uL      Absolute NRBC 0.00 x10 3/uL               Rads:   Radiological Procedure reviewed.    Signed by: Schuyler Amor MD  [phone]

## 2017-02-19 LAB — CBC AND DIFFERENTIAL
Absolute NRBC: 0 10*3/uL
Basophils Absolute Automated: 0.04 10*3/uL (ref 0.00–0.20)
Basophils Automated: 0.6 %
Eosinophils Absolute Automated: 0.24 10*3/uL (ref 0.00–0.70)
Eosinophils Automated: 3.7 %
Hematocrit: 26.5 % — ABNORMAL LOW (ref 42.0–52.0)
Hgb: 7.7 g/dL — ABNORMAL LOW (ref 13.0–17.0)
Immature Granulocytes Absolute: 0.04 10*3/uL
Immature Granulocytes: 0.6 %
Lymphocytes Absolute Automated: 0.5 10*3/uL (ref 0.50–4.40)
Lymphocytes Automated: 7.6 %
MCH: 25.4 pg — ABNORMAL LOW (ref 28.0–32.0)
MCHC: 29.1 g/dL — ABNORMAL LOW (ref 32.0–36.0)
MCV: 87.5 fL (ref 80.0–100.0)
MPV: 10.6 fL (ref 9.4–12.3)
Monocytes Absolute Automated: 0.98 10*3/uL (ref 0.00–1.20)
Monocytes: 14.9 %
Neutrophils Absolute: 4.77 10*3/uL (ref 1.80–8.10)
Neutrophils: 72.6 %
Nucleated RBC: 0 /100 WBC (ref 0.0–1.0)
Platelets: 229 10*3/uL (ref 140–400)
RBC: 3.03 10*6/uL — ABNORMAL LOW (ref 4.70–6.00)
RDW: 15 % (ref 12–15)
WBC: 6.57 10*3/uL (ref 3.50–10.80)

## 2017-02-19 LAB — COMPREHENSIVE METABOLIC PANEL
ALT: 13 U/L (ref 0–55)
AST (SGOT): 14 U/L (ref 5–34)
Albumin/Globulin Ratio: 1 (ref 0.9–2.2)
Albumin: 2.6 g/dL — ABNORMAL LOW (ref 3.5–5.0)
Alkaline Phosphatase: 66 U/L (ref 38–106)
BUN: 33 mg/dL — ABNORMAL HIGH (ref 9.0–28.0)
Bilirubin, Total: 0.3 mg/dL (ref 0.2–1.2)
CO2: 23 mEq/L (ref 22–29)
Calcium: 8.4 mg/dL (ref 7.9–10.2)
Chloride: 109 mEq/L (ref 100–111)
Creatinine: 1.2 mg/dL (ref 0.7–1.3)
Globulin: 2.7 g/dL (ref 2.0–3.6)
Glucose: 94 mg/dL (ref 70–100)
Potassium: 4.7 mEq/L (ref 3.5–5.1)
Protein, Total: 5.3 g/dL — ABNORMAL LOW (ref 6.0–8.3)
Sodium: 138 mEq/L (ref 136–145)

## 2017-02-19 LAB — GFR: EGFR: 57.2

## 2017-02-19 MED ORDER — ASPIRIN 81 MG PO CHEW
81.00 mg | CHEWABLE_TABLET | Freq: Every day | ORAL | Status: DC
Start: 2017-02-19 — End: 2017-02-23
  Administered 2017-02-19 – 2017-02-23 (×5): 81 mg via ORAL
  Filled 2017-02-19 (×5): qty 1

## 2017-02-19 MED ORDER — ATORVASTATIN CALCIUM 40 MG PO TABS
80.00 mg | ORAL_TABLET | Freq: Every evening | ORAL | Status: DC
Start: 2017-02-19 — End: 2017-02-23
  Administered 2017-02-19 – 2017-02-22 (×4): 80 mg via ORAL
  Filled 2017-02-19 (×3): qty 1
  Filled 2017-02-19: qty 2

## 2017-02-19 NOTE — Plan of Care (Signed)
Problem: Moderate/High Fall Risk Score >5  Goal: Patient will remain free of falls  Outcome: Progressing   02/19/17 0100   OTHER   Moderate Risk (6-13) MOD-Consider activation of bed alarm if appropriate;MOD-Apply bed exit alarm if patient is confused;MOD-Floor mat at bedside (where available) if appropriate;MOD-Use of chair-pad alarm when appropriate;MOD-Consider a move closer to Nurses Station       Problem: Altered GI Function  Goal: Fluid and electrolyte balance are achieved/maintained  Outcome: Progressing   02/19/17 0123   Goal/Interventions addressed this shift   Fluid and electrolyte balance are achieved/maintained Monitor intake and output every shift;Monitor/assess lab values and report abnormal values;Provide adequate hydration;Assess for confusion/personality changes;Monitor daily weight;Assess and reassess fluid and electrolyte status;Observe for seizure activity and initiate seizure precautions if indicated;Observe for cardiac arrhythmias;Monitor for muscle weakness     Goal: No bleeding  Outcome: Progressing   02/19/17 0123   Goal/Interventions addressed this shift   No bleeding  Monitor and assess vitals and hemodynamic parameters;Monitor/assess lab values and report abnormal values;Assess for bruising/petechia       Problem: Hemodynamic Status: Cardiac  Goal: Stable vital signs and fluid balance  Outcome: Progressing   02/18/17 0347   Goal/Interventions addressed this shift   Stable vital signs and fluid balance Assess signs and symptoms associated with cardiac rhythm changes;Monitor/assess vital signs and telemetry per unit protocol       Problem: Inadequate Tissue Perfusion  Goal: Adequate tissue perfusion will be maintained  Outcome: Progressing   02/18/17 1549   Goal/Interventions addressed this shift   Adequate tissue perfusion will be maintained Monitor/assess vital signs;Monitor/assess lab values and report abnormal values;Monitor/assess neurovascular status (pulses, capillary refill, pain,  paresthesia, paralysis, presence of edema);Monitor intake and output       Problem: Ineffective Gas Exchange  Goal: Effective breathing pattern  Outcome: Progressing   02/16/17 1533   Goal/Interventions addressed this shift   Effective breathing pattern Maintain O2 saturation level per LIP order;Teach/reinforce use of ordered respiratory interventions (ie. CPAP, BiPAP, Incentive Spirometer, Acapella);Monitor for medication induced respiratory depression       Problem: Impaired Mobility  Goal: Mobility/Activity is maintained at optimal level for patient  Outcome: Progressing   02/18/17 1549   Goal/Interventions addressed this shift   Mobility/activity is maintained at optimal level for patient Increase mobility as tolerated/progressive mobility;Perform active/passive ROM;Plan activities to conserve energy, plan rest periods

## 2017-02-19 NOTE — Progress Notes (Signed)
Medicine Shift Note    Patient Lines/Drains/Airways Status    Active Lines, Drains and Airways     Name:   Placement date:   Placement time:   Site:   Days:    Peripheral IV 02/09/17 Left Hand  02/09/17    1805    Hand    9    Peripheral IV 02/18/17 Left Antecubital  02/18/17    1153    Antecubital    less than 1                Last BM: 02/18/17    Pending Orders: Morning lab draws    Discharge Plan: Home with family, date TBD    POC: patient and daughter    Skin: Cardiac cath sites on neck and right wrist    Tele: Yes for complex cardiac disorders    Activity: Ambulatory with one person assistance    Interpreter Needs: No, Used: No, Method: N/A, Waiver Need/present: N/A    Shift Note: N/A

## 2017-02-19 NOTE — Progress Notes (Signed)
[facility]   Samyra Limb M. Lucianne Muss, MD, [facility]  PROGRESS NOTE  Office: [phone]  Cell phone [phone]                                       Date Time: 02/19/17 5:20 PM  Patient Name: [name],[name]      Assessment:    Ventricular tachycardia, non-sustained, recorded on tele on 02/10/17 at 12:07- asymptomatic and hemodynamically stable.    Atrial fibrillation, new onset, with rapid ventricular response. Converted to NSR spontaneously. Currently in SR. Patient with high risk of rebleed with use of anticoagulation given multiple AVMs (stomach, duodenum and cecum) noted on endoscopic procedures.   CHF with reduced EF of 36%   Severe pulmonary hypertension with an estimated PASP of 63 mmHg by echo   GI bleeding, no acute bleed.   Anemia, H/H stable.    CKD stage III- Cr stable.    Dyslipidemia   Plan:    ASA 81 mg daily   Lipitor 80 mg qhs   Coreg 3.125 mg bid   Lisinopril 10 mg bid- this would be the max dose of ACEI for him.    Consult Dr. Karsten Ro for PCI/stent to be done next week.  Recent Cardiac Work up:   Echo 02/11/17:   The left ventricle is normal in size.    * Left ventricular systolic function is moderately decreased with an  ejection fraction by Biplane Method of Discs of  36 %.    * Left Ventricular diastolic filling parameters are consistent with Grade II  diastolic dysfunction (pseudonormal pattern).    * Right ventricular size and systolic function are normal.    * Mild bi-atrial enlargement.    * Moderate to severe mitral valve regurgitation.    * There is mild tricuspid regurgitation.    * Moderate pulmonary hypertension with estimated right ventricular systolic  pressure of  63.29 mmHg.  EKG 02/09/17:   AF with RVR at 130 bpm  NS STT abnormalities  BNP 02/10/17:  2921  Subjective:   He has dyspnea with minimal exertion. Denies chest pain.    Medications:     Current Facility-Administered Medications   Medication Dose Route Frequency   . carvedilol  3.125 mg Oral Q12H SCH   .  lisinopril  10 mg Oral Q12H SCH   . pantoprazole  40 mg Oral BID         Physical Exam:     Vitals:    02/19/17 1617   BP: 116/70   Pulse: 89   Resp: 18   Temp: 98.4 F (36.9 C)   SpO2: 97%       Intake and Output Summary (Last 24 hours) at Date Time    Intake/Output Summary (Last 24 hours) at 02/19/17 1720  Last data filed at 02/19/17 0405   Gross per 24 hour   Intake              240 ml   Output              425 ml   Net             -185 ml       General appearance - in no distress   Head: NC/AT, mm moist  Neck - supple, Carotids 2+, no bruit, no JVD   Chest - clear to auscultation, no  wheezes, rales or rhonchi, symmetric air entry   Heart - normal rate and regular rhythm, S1 and S2 normal, no murmurs   Abdomen - soft, nontender, nondistended, no masses or organomegaly   Extremities - peripheral pulses normal, no pedal edema, no clubbing or cyanosis        Labs:   Recent CBC   Recent Labs      02/19/17   0409   RBC  3.03*   Hgb  7.7*   Hematocrit  26.5*   MCV  87.5   MCH  25.4*   MCHC  29.1*   RDW  15   MPV  10.6   Platelets  229     Recent BMP   Recent Labs      02/19/17   0409   Glucose  94   BUN  33.0*   Creatinine  1.2   Calcium  8.4   Sodium  138   Potassium  4.7   Chloride  109   CO2  23     Recent CMP   Recent Labs      02/19/17   0409   Glucose  94   BUN  33.0*   Creatinine  1.2   Sodium  138   CO2  23   Calcium  8.4   Albumin  2.6*   AST (SGOT)  14   ALT  13   Globulin  2.7     Recent CARDIAC ENZYMES No results for input(s): TROPONIN, ISTATTROPONI, CK, CKMB in the last 24 hours.    Invalid input(s): TROPONINT  Recent TSH No results for input(s): TSH in the last 24 hours.  Recent URIC ACID Invalid input(s): URIC  Recent BLOOD CULTURE No results for input(s): CXBLD in the last 24 hours.  Recent URINE CULTURE Invalid input(s): CXURN  Recent OCCULT BLOOD Invalid input(s): OCCBL  Recent PT/PTT No results for input(s): INR, PTT in the last 24 hours.    Invalid input(s): PTI, COUM, COUMP, ACOAG, ACOAP  Recent  PT/INR No results for input(s): INR in the last 24 hours.    Invalid input(s): PTI, COUM, COUMP  Recent LIPID PANEL No results for input(s): CHOL, TRIG, HDL in the last 24 hours.    Invalid input(s): LDLC, VLDLC, LRAT  Recent ABG No results for input(s): TEMP, FIO2, RATE, MODE, ETCO2, PEEP in the last 24 hours.    Invalid input(s): APH, APCO2, APO2, AHCO3, ATCO2, ABE, AOSAT, ABGS, ALLEN, STATS, O2DEL, O2FLO, PRESS, VNTMN, PRSUP, TIVOL      Rads:   Radiological Procedure reviewed.    Signed by: Valerie Roys, MD, [facility]

## 2017-02-19 NOTE — Progress Notes (Signed)
Medicine Shift Note    Patient Lines/Drains/Airways Status    Active Lines, Drains and Airways     Name:   Placement date:   Placement time:   Site:   Days:    Peripheral IV 02/09/17 Left Hand  02/09/17    1805    Hand    9    Peripheral IV 02/18/17 Left Antecubital  02/18/17    1153    Antecubital    1                Last BM: yesterday    Pending Orders: n/a     Discharge Plan: possibly 2 days    POC: daughter updated    Skin: skin    Tele: yes     Activity: stand by assist    Interpreter Needs: n/a Used: n/a, Method: n/a, Waiver Need/present: n/a    Shift Note: Patient's daughter updated by Dr York Grice.

## 2017-02-19 NOTE — Plan of Care (Signed)
Problem: Altered GI Function  Goal: Fluid and electrolyte balance are achieved/maintained  Outcome: Progressing   02/19/17 1034   Goal/Interventions addressed this shift   Fluid and electrolyte balance are achieved/maintained Monitor intake and output every shift;Monitor/assess lab values and report abnormal values;Provide adequate hydration;Assess for confusion/personality changes     Goal: No bleeding  Outcome: Progressing   02/19/17 1034   Goal/Interventions addressed this shift   No bleeding  Monitor and assess vitals and hemodynamic parameters;Monitor/assess lab values and report abnormal values       Problem: Hemodynamic Status: Cardiac  Goal: Stable vital signs and fluid balance  Outcome: Progressing   02/19/17 1034   Goal/Interventions addressed this shift   Stable vital signs and fluid balance Monitor/assess vital signs and telemetry per unit protocol;Weigh on admission and record weight daily;Assess signs and symptoms associated with cardiac rhythm changes;Monitor intake/output per unit protocol and/or LIP order       Problem: Inadequate Tissue Perfusion  Goal: Adequate tissue perfusion will be maintained  Outcome: Progressing   02/19/17 1034   Goal/Interventions addressed this shift   Adequate tissue perfusion will be maintained Monitor/assess lab values and report abnormal values;Monitor/assess vital signs       Problem: Ineffective Gas Exchange  Goal: Effective breathing pattern  Outcome: Progressing   02/19/17 1034   Goal/Interventions addressed this shift   Effective breathing pattern Maintain O2 saturation level per LIP order       Problem: Impaired Mobility  Goal: Mobility/Activity is maintained at optimal level for patient  Outcome: Progressing   02/19/17 1034   Goal/Interventions addressed this shift   Mobility/activity is maintained at optimal level for patient Increase mobility as tolerated/progressive mobility;Maintain proper body alignment;Plan activities to conserve energy, plan rest  periods;Reposition patient every 2 hours and as needed unless able to reposition self;Encourage independent activity per ability

## 2017-02-19 NOTE — Progress Notes (Signed)
PROGRESS NOTE    Date Time: 02/19/17 8:37 AM  Patient Name: [name],[name]  Attending Physician: Schuyler Amor, MD    Assessment:   Symptomatic anemia   Tachycardia   Hypotension   Chronic ulcerative colitis   AKI multifactorial   New onset atrial fibrillation   Acute on chronic systolic HFrEF  Plan:   D/w cardiology and daughter yesterday plan for PCI per cardiology recommendations   Monitor labs   Discharge once cleared   Continue PPI   SCD for DVT prophylaxis   Case d/w patient and staff care coordinated   History of Present Illness:   [name] is a 83 y.o. male who presents to the hospital with abnormal labs with severe anemia in ED he was hypotensive seen by [facility] for evaluation of admission to ICU however his repeat BP showed improvement and he is  admitted to [facility]   He c/o feeling weak and dizziness   Denies any abdominal pain , nausea vomiting   No hematemesis melena or hematochezia  No hemoptysis, cough or fever   No cp or SOB   Rest of ROS  negative    Patient was seen by me in the ED last night however he became hypotensive and tachycardiac and [facility] was consulted for possible ICU admission   Past Medical History:     Past Medical History:   Diagnosis Date   . Anemia     iron low   . DOE (dyspnea on exertion)     mild   . Glaucoma NEC    . Hypertensive disorder    . Skin cancer     2006 removed mole   . Ulcerative colitis, chronic    . Vision abnormalities        Past Surgical History:     Past Surgical History:   Procedure Laterality Date   . EGD, COLONOSCOPY  04/03/2012    Procedure: EGD, COLONOSCOPY;  Surgeon: Lestine Mount, MD;  Location: [facility];  Service: Gastroenterology;  Laterality: N/A;   . EGD, COLONOSCOPY N/A 02/11/2017    Procedure: EGD, COLONOSCOPY;  Surgeon: Lestine Mount, MD;  Location: [facility];  Service: Gastroenterology;  Laterality: N/A;   . EYE SURGERY      cataract       Family History:   History reviewed. No pertinent family history.    Social History:     Social History      Social History   . Marital status: Married     Spouse name: N/A   . Number of children: N/A   . Years of education: N/A     Social History Main Topics   . Smoking status: Former Smoker     Packs/day: 1.00     Years: 12.00     Quit date: 12/18/1961   . Smokeless tobacco: Never Used   . Alcohol use 0.0 oz/week      Comment: 0.5oz/d   . Drug use: No   . Sexual activity: Not on file     Other Topics Concern   . Not on file     Social History Narrative   . No narrative on file       Allergies:   No Known Allergies    Medications:     Prescriptions Prior to Admission   Medication Sig   . IRON PO Take 200 mg by mouth daily.   . [DISCONTINUED] lisinopril (PRINIVIL,ZESTRIL) 5 MG tablet Take by mouth daily.  Review of Systems:   A comprehensive review of systems was: per HPI     Physical Exam:     Vitals:    02/19/17 0747   BP: 148/74   Pulse: 66   Resp: 18   Temp: 97.9 F (36.6 C)   SpO2: 99%       Intake and Output Summary (Last 24 hours) at Date Time    Intake/Output Summary (Last 24 hours) at 02/19/17 0837  Last data filed at 02/19/17 0405   Gross per 24 hour   Intake              480 ml   Output              427 ml   Net               53 ml       General appearance - alert, well appearing, and in no distress  Mouth - mucous membranes moist, pharynx normal without lesions  Neck - supple, no significant adenopathy  Lymphatics - no palpable lymphadenopathy, no hepatosplenomegaly  Chest - clear to auscultation, no wheezes, rales or rhonchi, symmetric air entry  Heart - S1 and S2 normal, no murmurs noted, no gallops noted, no JVD, tachycardic   Abdomen - soft, nontender, nondistended, no masses or organomegaly  Neurological - alert, oriented, normal speech, no focal findings or movement disorder noted  Musculoskeletal - no joint tenderness, deformity or swelling  Extremities - peripheral pulses normal, no pedal edema, no clubbing or cyanosis  Skin - normal coloration and turgor, no rashes, no suspicious skin lesions  noted         Labs:     Results     Procedure Component Value Units Date/Time    Comprehensive metabolic panel [[id number]]  (Abnormal) Collected:  02/19/17 0409    Specimen:  Blood Updated:  02/19/17 0442     Glucose 94 mg/dL      BUN 10.2 (H) mg/dL      Creatinine 1.2 mg/dL      Sodium 725 mEq/L      Potassium 4.7 mEq/L      Chloride 109 mEq/L      CO2 23 mEq/L      Calcium 8.4 mg/dL      Protein, Total 5.3 (L) g/dL      Albumin 2.6 (L) g/dL      AST (SGOT) 14 U/L      ALT 13 U/L      Alkaline Phosphatase 66 U/L      Bilirubin, Total 0.3 mg/dL      Globulin 2.7 g/dL      Albumin/Globulin Ratio 1.0    GFR [[id number]] Collected:  02/19/17 0409     Updated:  02/19/17 0442     EGFR 57.2    CBC and differential [[id number]]  (Abnormal) Collected:  02/19/17 0409    Specimen:  Blood from Blood Updated:  02/19/17 0439     WBC 6.57 x10 3/uL      Hgb 7.7 (L) g/dL      Hematocrit 56.4 (L) %      Platelets 229 x10 3/uL      RBC 3.03 (L) x10 6/uL      MCV 87.5 fL      MCH 25.4 (L) pg      MCHC 29.1 (L) g/dL      RDW 15 %      MPV 10.6 fL  Neutrophils 72.6 %      Lymphocytes Automated 7.6 %      Monocytes 14.9 %      Eosinophils Automated 3.7 %      Basophils Automated 0.6 %      Immature Granulocyte 0.6 %      Nucleated RBC 0.0 /100 WBC      Neutrophils Absolute 4.77 x10 3/uL      Abs Lymph Automated 0.50 x10 3/uL      Abs Mono Automated 0.98 x10 3/uL      Abs Eos Automated 0.24 x10 3/uL      Absolute Baso Automated 0.04 x10 3/uL      Absolute Immature Granulocyte 0.04 x10 3/uL      Absolute NRBC 0.00 x10 3/uL               Rads:   Radiological Procedure reviewed.    Signed by: Schuyler Amor MD  [id number]

## 2017-02-20 LAB — COMPREHENSIVE METABOLIC PANEL
ALT: 13 U/L (ref 0–55)
AST (SGOT): 13 U/L (ref 5–34)
Albumin/Globulin Ratio: 0.9 (ref 0.9–2.2)
Albumin: 2.5 g/dL — ABNORMAL LOW (ref 3.5–5.0)
Alkaline Phosphatase: 70 U/L (ref 38–106)
BUN: 32 mg/dL — ABNORMAL HIGH (ref 9.0–28.0)
Bilirubin, Total: 0.3 mg/dL (ref 0.2–1.2)
CO2: 21 mEq/L — ABNORMAL LOW (ref 22–29)
Calcium: 8.2 mg/dL (ref 7.9–10.2)
Chloride: 108 mEq/L (ref 100–111)
Creatinine: 1.2 mg/dL (ref 0.7–1.3)
Globulin: 2.8 g/dL (ref 2.0–3.6)
Glucose: 93 mg/dL (ref 70–100)
Potassium: 4.3 mEq/L (ref 3.5–5.1)
Protein, Total: 5.3 g/dL — ABNORMAL LOW (ref 6.0–8.3)
Sodium: 135 mEq/L — ABNORMAL LOW (ref 136–145)

## 2017-02-20 LAB — CBC AND DIFFERENTIAL
Absolute NRBC: 0 10*3/uL
Basophils Absolute Automated: 0.03 10*3/uL (ref 0.00–0.20)
Basophils Automated: 0.5 %
Eosinophils Absolute Automated: 0.32 10*3/uL (ref 0.00–0.70)
Eosinophils Automated: 5.3 %
Hematocrit: 26.4 % — ABNORMAL LOW (ref 42.0–52.0)
Hgb: 7.8 g/dL — ABNORMAL LOW (ref 13.0–17.0)
Immature Granulocytes Absolute: 0.02 10*3/uL
Immature Granulocytes: 0.3 %
Lymphocytes Absolute Automated: 0.42 10*3/uL — ABNORMAL LOW (ref 0.50–4.40)
Lymphocytes Automated: 7 %
MCH: 25.7 pg — ABNORMAL LOW (ref 28.0–32.0)
MCHC: 29.5 g/dL — ABNORMAL LOW (ref 32.0–36.0)
MCV: 87.1 fL (ref 80.0–100.0)
MPV: 10.7 fL (ref 9.4–12.3)
Monocytes Absolute Automated: 0.85 10*3/uL (ref 0.00–1.20)
Monocytes: 14.2 %
Neutrophils Absolute: 4.36 10*3/uL (ref 1.80–8.10)
Neutrophils: 72.7 %
Nucleated RBC: 0 /100 WBC (ref 0.0–1.0)
Platelets: 246 10*3/uL (ref 140–400)
RBC: 3.03 10*6/uL — ABNORMAL LOW (ref 4.70–6.00)
RDW: 15 % (ref 12–15)
WBC: 6 10*3/uL (ref 3.50–10.80)

## 2017-02-20 LAB — GFR: EGFR: 57.2

## 2017-02-20 NOTE — Plan of Care (Signed)
Problem: Moderate/High Fall Risk Score >5  Goal: Patient will remain free of falls  Outcome: Progressing   02/19/17 2120   OTHER   Moderate Risk (6-13) MOD-Remain with patient during toileting;MOD-Place bedside commode and assistive devices out of sight when not in use;MOD-Place Fall Risk level on whiteboard in room       Problem: Altered GI Function  Goal: Fluid and electrolyte balance are achieved/maintained  Outcome: Progressing   02/20/17 0056   Goal/Interventions addressed this shift   Fluid and electrolyte balance are achieved/maintained Monitor intake and output every shift;Monitor/assess lab values and report abnormal values;Assess for confusion/personality changes;Assess and reassess fluid and electrolyte status;Observe for cardiac arrhythmias     Goal: No bleeding  Outcome: Progressing   02/20/17 0056   Goal/Interventions addressed this shift   No bleeding  Monitor and assess vitals and hemodynamic parameters;Monitor/assess lab values and report abnormal values;Assess for bruising/petechia       Problem: Hemodynamic Status: Cardiac  Goal: Stable vital signs and fluid balance  Outcome: Progressing   02/20/17 0056   Goal/Interventions addressed this shift   Stable vital signs and fluid balance Monitor/assess vital signs and telemetry per unit protocol;Weigh on admission and record weight daily;Assess signs and symptoms associated with cardiac rhythm changes;Monitor lab values;Monitor intake/output per unit protocol and/or LIP order;Monitor for leg swelling/edema and report to LIP if abnormal       Problem: Inadequate Tissue Perfusion  Goal: Adequate tissue perfusion will be maintained  Outcome: Progressing   02/20/17 0056   Goal/Interventions addressed this shift   Adequate tissue perfusion will be maintained Monitor/assess vital signs;Monitor/assess lab values and report abnormal values;Monitor/assess neurovascular status (pulses, capillary refill, pain, paresthesia, paralysis, presence of edema);Monitor  intake and output       Problem: Ineffective Gas Exchange  Goal: Effective breathing pattern  Outcome: Progressing   02/20/17 0056   Goal/Interventions addressed this shift   Effective breathing pattern Maintain O2 saturation level per LIP order       Problem: Impaired Mobility  Goal: Mobility/Activity is maintained at optimal level for patient  Outcome: Progressing   02/20/17 0056   Goal/Interventions addressed this shift   Mobility/activity is maintained at optimal level for patient Increase mobility as tolerated/progressive mobility;Encourage independent activity per ability;Maintain proper body alignment;Perform active/passive ROM;Assess for changes in respiratory status, level of consciousness and/or development of fatigue

## 2017-02-20 NOTE — Plan of Care (Signed)
Problem: Altered GI Function  Goal: Fluid and electrolyte balance are achieved/maintained  Outcome: Progressing   02/20/17 1132   Goal/Interventions addressed this shift   Fluid and electrolyte balance are achieved/maintained Monitor intake and output every shift;Monitor/assess lab values and report abnormal values;Provide adequate hydration;Assess for confusion/personality changes;Monitor daily weight;Assess and reassess fluid and electrolyte status;Observe for seizure activity and initiate seizure precautions if indicated     Goal: No bleeding  Outcome: Progressing   02/20/17 1132   Goal/Interventions addressed this shift   No bleeding  Monitor and assess vitals and hemodynamic parameters;Monitor/assess lab values and report abnormal values       Problem: Hemodynamic Status: Cardiac  Goal: Stable vital signs and fluid balance  Outcome: Progressing   02/20/17 1132   Goal/Interventions addressed this shift   Stable vital signs and fluid balance Monitor/assess vital signs and telemetry per unit protocol;Weigh on admission and record weight daily       Problem: Inadequate Tissue Perfusion  Goal: Adequate tissue perfusion will be maintained  Outcome: Progressing   02/20/17 1132   Goal/Interventions addressed this shift   Adequate tissue perfusion will be maintained Monitor/assess lab values and report abnormal values;Monitor/assess vital signs;Monitor/assess neurovascular status (pulses, capillary refill, pain, paresthesia, paralysis, presence of edema);Monitor intake and output;Monitor/assess for signs of VTE (edema of calf/thigh redness, pain);Monitor for signs and symptoms of a pulmonary embolism (dyspnea, tachypnea, tachycardia, confusion)       Problem: Ineffective Gas Exchange  Goal: Effective breathing pattern  Outcome: Progressing   02/20/17 1132   Goal/Interventions addressed this shift   Effective breathing pattern Maintain O2 saturation level per LIP order       Problem: Impaired Mobility  Goal:  Mobility/Activity is maintained at optimal level for patient  Outcome: Progressing   02/20/17 1132   Goal/Interventions addressed this shift   Mobility/activity is maintained at optimal level for patient Increase mobility as tolerated/progressive mobility;Maintain proper body alignment;Reposition patient every 2 hours and as needed unless able to reposition self;Plan activities to conserve energy, plan rest periods;Encourage independent activity per ability;Perform active/passive ROM

## 2017-02-20 NOTE — Progress Notes (Signed)
PROGRESS NOTE    Date Time: 02/20/17 10:52 AM  Patient Name: [name],[name]  Attending Physician: Schuyler Amor, MD    Assessment:   Symptomatic anemia   Tachycardia   Hypotension   Chronic ulcerative colitis   AKI multifactorial   New onset atrial fibrillation   Acute on chronic systolic HFrEF  Plan:   D/w cardiology planning for PCI this week per DR Karsten Ro   Labs stable   Ambulate with PT and OT  Continue PPI   SCD for DVT prophylaxis   Case d/w patient and staff care coordinated   History of Present Illness:   [name] is a 83 y.o. male who presents to the hospital with abnormal labs with severe anemia in ED he was hypotensive seen by [facility] for evaluation of admission to ICU however his repeat BP showed improvement and he is  admitted to [facility]   He c/o feeling weak and dizziness   Denies any abdominal pain , nausea vomiting   No hematemesis melena or hematochezia  No hemoptysis, cough or fever   No cp or SOB   Rest of ROS  negative    Patient was seen by me in the ED last night however he became hypotensive and tachycardiac and [facility] was consulted for possible ICU admission   Past Medical History:     Past Medical History:   Diagnosis Date   . Anemia     iron low   . DOE (dyspnea on exertion)     mild   . Glaucoma NEC    . Hypertensive disorder    . Skin cancer     2006 removed mole   . Ulcerative colitis, chronic    . Vision abnormalities        Past Surgical History:     Past Surgical History:   Procedure Laterality Date   . EGD, COLONOSCOPY  04/03/2012    Procedure: EGD, COLONOSCOPY;  Surgeon: Lestine Mount, MD;  Location: [facility];  Service: Gastroenterology;  Laterality: N/A;   . EGD, COLONOSCOPY N/A 02/11/2017    Procedure: EGD, COLONOSCOPY;  Surgeon: Lestine Mount, MD;  Location: [facility];  Service: Gastroenterology;  Laterality: N/A;   . EYE SURGERY      cataract       Family History:   History reviewed. No pertinent family history.    Social History:     Social History     Social History   .  Marital status: Married     Spouse name: N/A   . Number of children: N/A   . Years of education: N/A     Social History Main Topics   . Smoking status: Former Smoker     Packs/day: 1.00     Years: 12.00     Quit date: 12/18/1961   . Smokeless tobacco: Never Used   . Alcohol use 0.0 oz/week      Comment: 0.5oz/d   . Drug use: No   . Sexual activity: Not on file     Other Topics Concern   . Not on file     Social History Narrative   . No narrative on file       Allergies:   No Known Allergies    Medications:     Prescriptions Prior to Admission   Medication Sig   . IRON PO Take 200 mg by mouth daily.   . [DISCONTINUED] lisinopril (PRINIVIL,ZESTRIL) 5 MG tablet Take by mouth daily.  Review of Systems:   A comprehensive review of systems was: per HPI     Physical Exam:     Vitals:    02/20/17 1016   BP: 105/57   Pulse:    Resp:    Temp:    SpO2:        Intake and Output Summary (Last 24 hours) at Date Time    Intake/Output Summary (Last 24 hours) at 02/20/17 1052  Last data filed at 02/20/17 0700   Gross per 24 hour   Intake                0 ml   Output              250 ml   Net             -250 ml       General appearance - alert, well appearing, and in no distress  Mouth - mucous membranes moist, pharynx normal without lesions  Neck - supple, no significant adenopathy  Lymphatics - no palpable lymphadenopathy, no hepatosplenomegaly  Chest - clear to auscultation, no wheezes, rales or rhonchi, symmetric air entry  Heart - S1 and S2 normal, no murmurs noted, no gallops noted, no JVD, tachycardic   Abdomen - soft, nontender, nondistended, no masses or organomegaly  Neurological - alert, oriented, normal speech, no focal findings or movement disorder noted  Musculoskeletal - no joint tenderness, deformity or swelling  Extremities - peripheral pulses normal, no pedal edema, no clubbing or cyanosis  Skin - normal coloration and turgor, no rashes, no suspicious skin lesions noted         Labs:     Results     Procedure  Component Value Units Date/Time    GFR [[id number]] Collected:  02/20/17 0331     Updated:  02/20/17 0427     EGFR 57.2    Comprehensive metabolic panel [[id number]]  (Abnormal) Collected:  02/20/17 0331    Specimen:  Blood Updated:  02/20/17 0427     Glucose 93 mg/dL      BUN 62.1 (H) mg/dL      Creatinine 1.2 mg/dL      Sodium 308 (L) mEq/L      Potassium 4.3 mEq/L      Chloride 108 mEq/L      CO2 21 (L) mEq/L      Calcium 8.2 mg/dL      Protein, Total 5.3 (L) g/dL      Albumin 2.5 (L) g/dL      AST (SGOT) 13 U/L      ALT 13 U/L      Alkaline Phosphatase 70 U/L      Bilirubin, Total 0.3 mg/dL      Globulin 2.8 g/dL      Albumin/Globulin Ratio 0.9    CBC and differential [[id number]]  (Abnormal) Collected:  02/20/17 0331    Specimen:  Blood from Blood Updated:  02/20/17 0407     WBC 6.00 x10 3/uL      Hgb 7.8 (L) g/dL      Hematocrit 95.2 (L) %      Platelets 246 x10 3/uL      RBC 3.03 (L) x10 6/uL      MCV 87.1 fL      MCH 25.7 (L) pg      MCHC 29.5 (L) g/dL      RDW 15 %      MPV 10.7 fL      Neutrophils  72.7 %      Lymphocytes Automated 7.0 %      Monocytes 14.2 %      Eosinophils Automated 5.3 %      Basophils Automated 0.5 %      Immature Granulocyte 0.3 %      Nucleated RBC 0.0 /100 WBC      Neutrophils Absolute 4.36 x10 3/uL      Abs Lymph Automated 0.42 (L) x10 3/uL      Abs Mono Automated 0.85 x10 3/uL      Abs Eos Automated 0.32 x10 3/uL      Absolute Baso Automated 0.03 x10 3/uL      Absolute Immature Granulocyte 0.02 x10 3/uL      Absolute NRBC 0.00 x10 3/uL               Rads:   Radiological Procedure reviewed.    Signed by: Schuyler Amor MD  [id number]

## 2017-02-20 NOTE — Progress Notes (Signed)
[facility]   Lisandra Mathisen M. Lucianne Muss, MD, [facility]  PROGRESS NOTE  Office: [phone]  Cell phone [phone]                                       Date Time: 02/20/17 11:52 AM  Patient Name: [name],[name]      Assessment:    Ventricular tachycardia, non-sustained, recorded on tele on 02/10/17 at 12:07- asymptomatic and hemodynamically stable.    Atrial fibrillation, new onset, with rapid ventricular response. Converted to NSR spontaneously. Currently in SR. Patient with high risk of rebleed with use of anticoagulation given multiple AVMs (stomach, duodenum and cecum) noted on endoscopic procedures.   CHF with reduced EF of 36%   Severe pulmonary hypertension with an estimated PASP of 63 mmHg by echo   GI bleeding, no acute bleed.   Anemia, H/H stable.    CKD stage III- Cr stable.    Dyslipidemia   Plan:    ASA 81 mg daily   Lipitor 80 mg qhs   Coreg 3.125 mg bid   Lisinopril 10 mg bid- this would be the max dose of ACEI for him.    Consult Sr. Sahebi for anemia   To be seen by Dr. Karsten Ro for PCI/stent to LM/LAD   Recent Cardiac Work up:   Echo 02/11/17:   The left ventricle is normal in size.    * Left ventricular systolic function is moderately decreased with an  ejection fraction by Biplane Method of Discs of  36 %.    * Left Ventricular diastolic filling parameters are consistent with Grade II  diastolic dysfunction (pseudonormal pattern).    * Right ventricular size and systolic function are normal.    * Mild bi-atrial enlargement.    * Moderate to severe mitral valve regurgitation.    * There is mild tricuspid regurgitation.    * Moderate pulmonary hypertension with estimated right ventricular systolic  pressure of  63.29 mmHg.  EKG 02/09/17:   AF with RVR at 130 bpm  NS STT abnormalities  BNP 02/10/17:  2921  Subjective:   He has dyspnea with minimal exertion. Denies chest pain.    Medications:     Current Facility-Administered Medications   Medication Dose Route Frequency   . aspirin   81 mg Oral Daily   . atorvastatin  80 mg Oral QHS   . carvedilol  3.125 mg Oral Q12H SCH   . lisinopril  10 mg Oral Q12H SCH   . pantoprazole  40 mg Oral BID         Physical Exam:     Vitals:    02/20/17 1016   BP: 105/57   Pulse:    Resp:    Temp:    SpO2:        Intake and Output Summary (Last 24 hours) at Date Time    Intake/Output Summary (Last 24 hours) at 02/20/17 1152  Last data filed at 02/20/17 0700   Gross per 24 hour   Intake                0 ml   Output              250 ml   Net             -250 ml       General appearance - in no  distress   Head: NC/AT, mm moist  Neck - supple, Carotids 2+, no bruit, no JVD   Chest - clear to auscultation, no wheezes, rales or rhonchi, symmetric air entry   Heart - normal rate and regular rhythm, S1 and S2 normal, no murmurs   Abdomen - soft, nontender, nondistended, no masses or organomegaly   Extremities - peripheral pulses normal, no pedal edema, no clubbing or cyanosis        Labs:   Recent CBC   Recent Labs      02/20/17   0331   RBC  3.03*   Hgb  7.8*   Hematocrit  26.4*   MCV  87.1   MCH  25.7*   MCHC  29.5*   RDW  15   MPV  10.7   Platelets  246     Recent BMP   Recent Labs      02/20/17   0331   Glucose  93   BUN  32.0*   Creatinine  1.2   Calcium  8.2   Sodium  135*   Potassium  4.3   Chloride  108   CO2  21*     Recent CMP   Recent Labs      02/20/17   0331   Glucose  93   BUN  32.0*   Creatinine  1.2   Sodium  135*   CO2  21*   Calcium  8.2   Albumin  2.5*   AST (SGOT)  13   ALT  13   Globulin  2.8     Recent CARDIAC ENZYMES No results for input(s): TROPONIN, ISTATTROPONI, CK, CKMB in the last 24 hours.    Invalid input(s): TROPONINT  Recent TSH No results for input(s): TSH in the last 24 hours.  Recent URIC ACID Invalid input(s): URIC  Recent BLOOD CULTURE No results for input(s): CXBLD in the last 24 hours.  Recent URINE CULTURE Invalid input(s): CXURN  Recent OCCULT BLOOD Invalid input(s): OCCBL  Recent PT/PTT No results for input(s): INR, PTT in the  last 24 hours.    Invalid input(s): PTI, COUM, COUMP, ACOAG, ACOAP  Recent PT/INR No results for input(s): INR in the last 24 hours.    Invalid input(s): PTI, COUM, COUMP  Recent LIPID PANEL No results for input(s): CHOL, TRIG, HDL in the last 24 hours.    Invalid input(s): LDLC, VLDLC, LRAT  Recent ABG No results for input(s): TEMP, FIO2, RATE, MODE, ETCO2, PEEP in the last 24 hours.    Invalid input(s): APH, APCO2, APO2, AHCO3, ATCO2, ABE, AOSAT, ABGS, ALLEN, STATS, O2DEL, O2FLO, PRESS, VNTMN, PRSUP, TIVOL      Rads:   Radiological Procedure reviewed.    Signed by: Valerie Roys, MD, [facility]

## 2017-02-20 NOTE — Progress Notes (Signed)
Medicine Shift Note    Patient Lines/Drains/Airways Status    Active Lines, Drains and Airways     Name:   Placement date:   Placement time:   Site:   Days:    Peripheral IV 02/09/17 Left Hand  02/09/17    1805    Hand    10    Peripheral IV 02/18/17 Left Antecubital  02/18/17    1153    Antecubital    1                Last BM:11/30    Pending Orders: None    Discharge Plan: Possibly tomorrow    POC: patient    Skin: cardiac catheter sites    Tele: Yes 20%EF    Activity: Standby assist    Interpreter Needs: No    Shift Note: Cardiac cath sites dry and intact, AOx4, VSS, denies pain or SOB, using urinal

## 2017-02-20 NOTE — Progress Notes (Signed)
Medicine Shift Note    Patient Lines/Drains/Airways Status    Active Lines, Drains and Airways     Name:   Placement date:   Placement time:   Site:   Days:    Peripheral IV 02/09/17 Left Hand  02/09/17    1805    Hand    10    Peripheral IV 02/18/17 Left Antecubital  02/18/17    1153    Antecubital    2                Last BM: yesterday    Pending Orders: n/a    Discharge Plan: to be determine    POC: daughter    Skin: no skin issue    Tele: yes Patient has extensive cardiac history    Activity: stand by assist    Interpreter Needs: n/a, Used: n/a, Method: n/a, Waiver Need/present: n/a    Shift Note: Patient is alert and oriented X4. Hygienic needs met. Daughter updated

## 2017-02-21 DIAGNOSIS — E8809 Other disorders of plasma-protein metabolism, not elsewhere classified: Secondary | ICD-10-CM

## 2017-02-21 LAB — CBC AND DIFFERENTIAL
Absolute NRBC: 0 10*3/uL
Basophils Absolute Automated: 0.04 10*3/uL (ref 0.00–0.20)
Basophils Automated: 0.7 %
Eosinophils Absolute Automated: 0.31 10*3/uL (ref 0.00–0.70)
Eosinophils Automated: 5.2 %
Hematocrit: 27.6 % — ABNORMAL LOW (ref 42.0–52.0)
Hgb: 8 g/dL — ABNORMAL LOW (ref 13.0–17.0)
Immature Granulocytes Absolute: 0.02 10*3/uL
Immature Granulocytes: 0.3 %
Lymphocytes Absolute Automated: 0.45 10*3/uL — ABNORMAL LOW (ref 0.50–4.40)
Lymphocytes Automated: 7.5 %
MCH: 24.9 pg — ABNORMAL LOW (ref 28.0–32.0)
MCHC: 29 g/dL — ABNORMAL LOW (ref 32.0–36.0)
MCV: 86 fL (ref 80.0–100.0)
MPV: 10.4 fL (ref 9.4–12.3)
Monocytes Absolute Automated: 0.81 10*3/uL (ref 0.00–1.20)
Monocytes: 13.5 %
Neutrophils Absolute: 4.38 10*3/uL (ref 1.80–8.10)
Neutrophils: 72.8 %
Nucleated RBC: 0 /100 WBC (ref 0.0–1.0)
Platelets: 278 10*3/uL (ref 140–400)
RBC: 3.21 10*6/uL — ABNORMAL LOW (ref 4.70–6.00)
RDW: 15 % (ref 12–15)
WBC: 6.01 10*3/uL (ref 3.50–10.80)

## 2017-02-21 LAB — COMPREHENSIVE METABOLIC PANEL
ALT: 12 U/L (ref 0–55)
AST (SGOT): 13 U/L (ref 5–34)
Albumin/Globulin Ratio: 0.9 (ref 0.9–2.2)
Albumin: 2.5 g/dL — ABNORMAL LOW (ref 3.5–5.0)
Alkaline Phosphatase: 70 U/L (ref 38–106)
BUN: 29 mg/dL — ABNORMAL HIGH (ref 9.0–28.0)
Bilirubin, Total: 0.3 mg/dL (ref 0.2–1.2)
CO2: 22 mEq/L (ref 22–29)
Calcium: 8.1 mg/dL (ref 7.9–10.2)
Chloride: 106 mEq/L (ref 100–111)
Creatinine: 1.2 mg/dL (ref 0.7–1.3)
Globulin: 2.8 g/dL (ref 2.0–3.6)
Glucose: 91 mg/dL (ref 70–100)
Potassium: 4.3 mEq/L (ref 3.5–5.1)
Protein, Total: 5.3 g/dL — ABNORMAL LOW (ref 6.0–8.3)
Sodium: 133 mEq/L — ABNORMAL LOW (ref 136–145)

## 2017-02-21 LAB — RETICULOCYTES
Immature Retic Fract: 13.3 % (ref 2.3–13.4)
Reticulocyte Count Absolute: 0.0535 10*6/uL (ref 0.0235–0.1500)
Reticulocyte Count Automated: 1.7 % (ref 0.5–2.5)
Reticulocyte Hemoglobin: 16.9 pg — ABNORMAL LOW (ref 28.2–36.6)

## 2017-02-21 LAB — FERRITIN: Ferritin: 36.53 ng/mL (ref 21.80–274.70)

## 2017-02-21 LAB — GFR: EGFR: 57.2

## 2017-02-21 LAB — FOLATE: Folate: 8.8 ng/mL

## 2017-02-21 LAB — SEDIMENTATION RATE: Sed Rate: 63 mm/Hr — ABNORMAL HIGH (ref 0–15)

## 2017-02-21 LAB — HEMOLYSIS INDEX: Hemolysis Index: 11 (ref 0–18)

## 2017-02-21 LAB — VITAMIN B12: Vitamin B-12: 205 pg/mL — ABNORMAL LOW (ref 211–911)

## 2017-02-21 NOTE — Consults (Signed)
CONSULTATION    Date Time: 02/21/17 6:02 AM  Patient Name: [name],[name]  Requesting Physician: Schuyler Amor, MD      Reason for Consultation:   Anemia  Assessment:   1.Anemia  2.Chronic ulcerative colitis   3.AKI  4.Afib  5.CHF  Plan:   -Will send for Ferritin, LDH, Hapto , retic  -Myeloma panel  -Cont to Hold AC due to AVMs  -Pt to be seen by Dr. Karsten Ro for PCI/stent to LM/LAD   Further mgm based on above W/U and the trend of CBC  Thank you for consultation, will cont to F/U with you   History:   [name] is a 83 y.o. male who presents to the hospital on 02/09/2017 with sever anemia, was Hypotensive, pt was admitted to [facility], results revealing AVMs, AC was held due to GIB, was noted remains anemic and Hematology was consulted for further mgm, no new onset bleeding, no wt loss, no night sweats or fever     Past Medical History:     Past Medical History:   Diagnosis Date   . Anemia     iron low   . DOE (dyspnea on exertion)     mild   . Glaucoma NEC    . Hypertensive disorder    . Skin cancer     2006 removed mole   . Ulcerative colitis, chronic    . Vision abnormalities        Past Surgical History:     Past Surgical History:   Procedure Laterality Date   . EGD, COLONOSCOPY  04/03/2012    Procedure: EGD, COLONOSCOPY;  Surgeon: Lestine Mount, MD;  Location: [facility];  Service: Gastroenterology;  Laterality: N/A;   . EGD, COLONOSCOPY N/A 02/11/2017    Procedure: EGD, COLONOSCOPY;  Surgeon: Lestine Mount, MD;  Location: [facility];  Service: Gastroenterology;  Laterality: N/A;   . EYE SURGERY      cataract       Family History:   History reviewed. No pertinent family history.    Social History:     Social History     Social History   . Marital status: Married     Spouse name: N/A   . Number of children: N/A   . Years of education: N/A     Social History Main Topics   . Smoking status: Former Smoker     Packs/day: 1.00     Years: 12.00     Quit date: 12/18/1961   . Smokeless  tobacco: Never Used   . Alcohol use 0.0 oz/week      Comment: 0.5oz/d   . Drug use: No   . Sexual activity: Not on file     Other Topics Concern   . Not on file     Social History Narrative   . No narrative on file       Allergies:   No Known Allergies    Medications:     Current Facility-Administered Medications   Medication Dose Route Frequency   . aspirin  81 mg Oral Daily   . atorvastatin  80 mg Oral QHS   . carvedilol  3.125 mg Oral Q12H SCH   . lisinopril  10 mg Oral Q12H SCH   . pantoprazole  40 mg Oral BID       Review of Systems:   A comprehensive review of systems was: Ten point review of systems negative or as per above  Physical Exam:     Vitals:    02/21/17 0543   BP: 111/61   Pulse: 63   Resp: 16   Temp: 98.1 F (36.7 C)   SpO2: 99%       Intake and Output Summary (Last 24 hours) at Date Time    Intake/Output Summary (Last 24 hours) at 02/21/17 0602  Last data filed at 02/20/17 0700   Gross per 24 hour   Intake                0 ml   Output              250 ml   Net             -250 ml       General appearance - alert, well appearing, and in no distress  Mouth - mucous membranes moist, pharynx normal without lesions  Neck - supple, no significant adenopathy  Lymphatics - no palpable lymphadenopathy, no hepatosplenomegaly  Chest - clear to auscultation, no wheezes, rales or rhonchi, symmetric air entry  Heart - S1 and S2 normal, no murmurs noted, no gallops noted, no JVD, tachycardic   Abdomen - soft, nontender, nondistended, no masses or organomegaly  Neurological - alert, oriented, normal speech, no focal findings or movement disorder noted  Musculoskeletal - no joint tenderness, deformity or swelling  Extremities - peripheral pulses normal, no pedal edema, no clubbing or cyanosis  Skin - normal coloration and turgor, no rashes, no suspicious skin lesions noted       Labs Reviewed:     Results     Procedure Component Value Units Date/Time    Comprehensive metabolic panel [[id number]] Collected:   02/21/17 0549    Specimen:  Blood Updated:  02/21/17 0548    CBC and differential [[id number]] Collected:  02/21/17 0548    Specimen:  Blood from Blood Updated:  02/21/17 0548            Rads:   No results found.  Radiological Procedure reviewed.     Signed by: Jayln Bellow

## 2017-02-21 NOTE — Plan of Care (Signed)
Problem: Moderate/High Fall Risk Score >5  Goal: Patient will remain free of falls  Outcome: Progressing   02/21/17 0800   OTHER   Moderate Risk (6-13) MOD-Place bedside commode and assistive devices out of sight when not in use;MOD-Use of assistive devices-bedside commode if appropriate;MOD-Remain with patient during toileting;MOD-Use of chair-pad alarm when appropriate       Problem: Altered GI Function  Goal: Fluid and electrolyte balance are achieved/maintained  Outcome: Progressing   02/21/17 0107   Goal/Interventions addressed this shift   Fluid and electrolyte balance are achieved/maintained Monitor/assess lab values and report abnormal values;Provide adequate hydration;Assess for confusion/personality changes;Monitor daily weight;Assess and reassess fluid and electrolyte status     Goal: No bleeding  Outcome: Progressing   02/21/17 0107   Goal/Interventions addressed this shift   No bleeding  Monitor and assess vitals and hemodynamic parameters;Monitor/assess lab values and report abnormal values;Assess for bruising/petechia       Problem: Hemodynamic Status: Cardiac  Goal: Stable vital signs and fluid balance  Outcome: Progressing   02/21/17 0107   Goal/Interventions addressed this shift   Stable vital signs and fluid balance Monitor/assess vital signs and telemetry per unit protocol;Weigh on admission and record weight daily;Assess signs and symptoms associated with cardiac rhythm changes;Monitor lab values;Monitor for leg swelling/edema and report to LIP if abnormal       Problem: Inadequate Tissue Perfusion  Goal: Adequate tissue perfusion will be maintained  Outcome: Progressing   02/21/17 0107   Goal/Interventions addressed this shift   Adequate tissue perfusion will be maintained Monitor/assess vital signs;Monitor/assess lab values and report abnormal values;Monitor/assess neurovascular status (pulses, capillary refill, pain, paresthesia, paralysis, presence of edema);Monitor/assess for signs of VTE  (edema of calf/thigh redness, pain);Monitor for signs and symptoms of a pulmonary embolism (dyspnea, tachypnea, tachycardia, confusion)       Problem: Ineffective Gas Exchange  Goal: Effective breathing pattern  Outcome: Progressing   02/21/17 0107   Goal/Interventions addressed this shift   Effective breathing pattern Maintain O2 saturation level per LIP order       Problem: Impaired Mobility  Goal: Mobility/Activity is maintained at optimal level for patient  Outcome: Progressing   02/21/17 0107   Goal/Interventions addressed this shift   Mobility/activity is maintained at optimal level for patient Increase mobility as tolerated/progressive mobility;Encourage independent activity per ability;Assess for changes in respiratory status, level of consciousness and/or development of fatigue

## 2017-02-21 NOTE — Progress Notes (Signed)
Medicine Shift Note    Patient Lines/Drains/Airways Status    Active Lines, Drains and Airways     Name:   Placement date:   Placement time:   Site:   Days:    Peripheral IV 02/09/17 Left Hand  02/09/17    1805    Hand    11    Peripheral IV 02/18/17 Left Antecubital  02/18/17    1153    Antecubital    3                Last BM: 02/20/17    Pending Orders: PTCA for CTO    Discharge Plan: PTCA for CTO    POC: patient    Skin: bruising BUE    Tele: yes    Activity: standby assist    Interpreter Needs: no    Shift Note: Pt A&Ox4. VSS. Pt's plan is to get PTCA for CTO. Strict I&O's with 1500 mL restriction. Input being documented. Notified tech of strict output. Daily weight done.

## 2017-02-21 NOTE — Progress Notes (Signed)
PROGRESS NOTE    Date Time: 02/21/17 10:53 AM  Patient Name: [name]  Attending Physician: Schuyler Amor, MD    Assessment:   Symptomatic anemia   Tachycardia   Hypotension   Chronic ulcerative colitis   AKI multifactorial   New onset atrial fibrillation   Acute on chronic systolic HFrEF  Plan:   D/w cardiology planning for PCI on Wednesday by  DR Karsten Ro   Labs stable   Ambulate with PT and OT  Continue PPI   SCD for DVT prophylaxis   Case d/w patient and staff care coordinated   History of Present Illness:   [name] is a 83 y.o. male who presents to the hospital with abnormal labs with severe anemia in ED he was hypotensive seen by [facility] for evaluation of admission to ICU however his repeat BP showed improvement and he is  admitted to [facility]   He c/o feeling weak and dizziness   Denies any abdominal pain , nausea vomiting   No hematemesis melena or hematochezia  No hemoptysis, cough or fever   No cp or SOB   Rest of ROS  negative    Patient was seen by me in the ED last night however he became hypotensive and tachycardiac and [facility] was consulted for possible ICU admission   Past Medical History:     Past Medical History:   Diagnosis Date   . Anemia     iron low   . DOE (dyspnea on exertion)     mild   . Glaucoma NEC    . Hypertensive disorder    . Skin cancer     2006 removed mole   . Ulcerative colitis, chronic    . Vision abnormalities        Past Surgical History:     Past Surgical History:   Procedure Laterality Date   . EGD, COLONOSCOPY  04/03/2012    Procedure: EGD, COLONOSCOPY;  Surgeon: Lestine Mount, MD;  Location: [id number];  Service: Gastroenterology;  Laterality: N/A;   . EGD, COLONOSCOPY N/A 02/11/2017    Procedure: EGD, COLONOSCOPY;  Surgeon: Lestine Mount, MD;  Location: [facility];  Service: Gastroenterology;  Laterality: N/A;   . EYE SURGERY      cataract       Family History:   History reviewed. No pertinent family history.    Social History:     Social History     Social History   .  Marital status: Married     Spouse name: N/A   . Number of children: N/A   . Years of education: N/A     Social History Main Topics   . Smoking status: Former Smoker     Packs/day: 1.00     Years: 12.00     Quit date: 12/18/1961   . Smokeless tobacco: Never Used   . Alcohol use 0.0 oz/week      Comment: 0.5oz/d   . Drug use: No   . Sexual activity: Not on file     Other Topics Concern   . Not on file     Social History Narrative   . No narrative on file       Allergies:   No Known Allergies    Medications:     Prescriptions Prior to Admission   Medication Sig   . IRON PO Take 200 mg by mouth daily.   . [DISCONTINUED] lisinopril (PRINIVIL,ZESTRIL) 5 MG tablet Take by mouth daily.  Review of Systems:   A comprehensive review of systems was: per HPI     Physical Exam:     Vitals:    02/21/17 1034   BP: 106/63   Pulse:    Resp:    Temp:    SpO2:        Intake and Output Summary (Last 24 hours) at Date Time    Intake/Output Summary (Last 24 hours) at 02/21/17 1053  Last data filed at 02/21/17 1036   Gross per 24 hour   Intake              478 ml   Output                0 ml   Net              478 ml       General appearance - alert, well appearing, and in no distress  Mouth - mucous membranes moist, pharynx normal without lesions  Neck - supple, no significant adenopathy  Lymphatics - no palpable lymphadenopathy, no hepatosplenomegaly  Chest - clear to auscultation, no wheezes, rales or rhonchi, symmetric air entry  Heart - S1 and S2 normal, no murmurs noted, no gallops noted, no JVD, tachycardic   Abdomen - soft, nontender, nondistended, no masses or organomegaly  Neurological - alert, oriented, normal speech, no focal findings or movement disorder noted  Musculoskeletal - no joint tenderness, deformity or swelling  Extremities - peripheral pulses normal, no pedal edema, no clubbing or cyanosis  Skin - normal coloration and turgor, no rashes, no suspicious skin lesions noted         Labs:     Results     Procedure  Component Value Units Date/Time    Comprehensive metabolic panel [[id number]]  (Abnormal) Collected:  02/21/17 0549    Specimen:  Blood Updated:  02/21/17 0637     Glucose 91 mg/dL      BUN 40.9 (H) mg/dL      Creatinine 1.2 mg/dL      Sodium 811 (L) mEq/L      Potassium 4.3 mEq/L      Chloride 106 mEq/L      CO2 22 mEq/L      Calcium 8.1 mg/dL      Protein, Total 5.3 (L) g/dL      Albumin 2.5 (L) g/dL      AST (SGOT) 13 U/L      ALT 12 U/L      Alkaline Phosphatase 70 U/L      Bilirubin, Total 0.3 mg/dL      Globulin 2.8 g/dL      Albumin/Globulin Ratio 0.9    GFR [[id number]] Collected:  02/21/17 0549     Updated:  02/21/17 0637     EGFR 57.2    CBC and differential [[id number]]  (Abnormal) Collected:  02/21/17 0548    Specimen:  Blood from Blood Updated:  02/21/17 0609     WBC 6.01 x10 3/uL      Hgb 8.0 (L) g/dL      Hematocrit 46.9 (L) %      Platelets 278 x10 3/uL      RBC 3.21 (L) x10 6/uL      MCV 86.0 fL      MCH 24.9 (L) pg      MCHC 29.0 (L) g/dL      RDW 15 %      MPV 10.4 fL      Neutrophils  72.8 %      Lymphocytes Automated 7.5 %      Monocytes 13.5 %      Eosinophils Automated 5.2 %      Basophils Automated 0.7 %      Immature Granulocyte 0.3 %      Nucleated RBC 0.0 /100 WBC      Neutrophils Absolute 4.38 x10 3/uL      Abs Lymph Automated 0.45 (L) x10 3/uL      Abs Mono Automated 0.81 x10 3/uL      Abs Eos Automated 0.31 x10 3/uL      Absolute Baso Automated 0.04 x10 3/uL      Absolute Immature Granulocyte 0.02 x10 3/uL      Absolute NRBC 0.00 x10 3/uL               Rads:   Radiological Procedure reviewed.    Signed by: Schuyler Amor MD  [id number]

## 2017-02-21 NOTE — Plan of Care (Signed)
Problem: Moderate/High Fall Risk Score >5  Goal: Patient will remain free of falls  Outcome: Progressing   02/20/17 2145   OTHER   Moderate Risk (6-13) MOD-Remain with patient during toileting;MOD-Use of assistive devices-bedside commode if appropriate;MOD-Place bedside commode and assistive devices out of sight when not in use;MOD-Place Fall Risk level on whiteboard in room       Problem: Altered GI Function  Goal: Fluid and electrolyte balance are achieved/maintained  Outcome: Progressing   02/21/17 0107   Goal/Interventions addressed this shift   Fluid and electrolyte balance are achieved/maintained Monitor/assess lab values and report abnormal values;Provide adequate hydration;Assess for confusion/personality changes;Monitor daily weight;Assess and reassess fluid and electrolyte status     Goal: No bleeding  Outcome: Progressing   02/21/17 0107   Goal/Interventions addressed this shift   No bleeding  Monitor and assess vitals and hemodynamic parameters;Monitor/assess lab values and report abnormal values;Assess for bruising/petechia       Problem: Hemodynamic Status: Cardiac  Goal: Stable vital signs and fluid balance  Outcome: Progressing   02/21/17 0107   Goal/Interventions addressed this shift   Stable vital signs and fluid balance Monitor/assess vital signs and telemetry per unit protocol;Weigh on admission and record weight daily;Assess signs and symptoms associated with cardiac rhythm changes;Monitor lab values;Monitor for leg swelling/edema and report to LIP if abnormal       Problem: Inadequate Tissue Perfusion  Goal: Adequate tissue perfusion will be maintained  Outcome: Progressing   02/21/17 0107   Goal/Interventions addressed this shift   Adequate tissue perfusion will be maintained Monitor/assess vital signs;Monitor/assess lab values and report abnormal values;Monitor/assess neurovascular status (pulses, capillary refill, pain, paresthesia, paralysis, presence of edema);Monitor/assess for signs of  VTE (edema of calf/thigh redness, pain);Monitor for signs and symptoms of a pulmonary embolism (dyspnea, tachypnea, tachycardia, confusion)       Problem: Ineffective Gas Exchange  Goal: Effective breathing pattern  Outcome: Progressing   02/21/17 0107   Goal/Interventions addressed this shift   Effective breathing pattern Maintain O2 saturation level per LIP order       Problem: Impaired Mobility  Goal: Mobility/Activity is maintained at optimal level for patient  Outcome: Progressing   02/21/17 0107   Goal/Interventions addressed this shift   Mobility/activity is maintained at optimal level for patient Increase mobility as tolerated/progressive mobility;Encourage independent activity per ability;Assess for changes in respiratory status, level of consciousness and/or development of fatigue

## 2017-02-21 NOTE — Progress Notes (Signed)
[facility]   Sharay Bellissimo M. Lucianne Muss, MD, [facility]  PROGRESS NOTE  Office: [phone]  Cell phone [phone]                                       Date Time: 02/21/17 10:34 AM  Patient Name: [name],[name]      Assessment:    Ventricular tachycardia, non-sustained, recorded on tele on 02/10/17 at 12:07- asymptomatic and hemodynamically stable.    Atrial fibrillation, new onset, with rapid ventricular response. Converted to NSR spontaneously. Currently in SR. Patient with high risk of rebleed with use of anticoagulation given multiple AVMs (stomach, duodenum and cecum) noted on endoscopic procedures.   CHF with reduced EF of 36%   Severe pulmonary hypertension with an estimated PASP of 63 mmHg by echo   GI bleeding, no acute bleed.   Anemia, H/H stable.    CKD stage III- Cr stable.    Dyslipidemia   Plan:    ASA 81 mg daily   Lipitor 80 mg qhs   Coreg 3.125 mg bid   Lisinopril 10 mg bid- this would be the max dose of ACEI for him.    PCI/stent to LM/LAD by Dr. Karsten Ro on Wednesday  Recent Cardiac Work up:   Echo 02/11/17:   The left ventricle is normal in size.    * Left ventricular systolic function is moderately decreased with an  ejection fraction by Biplane Method of Discs of  36 %.    * Left Ventricular diastolic filling parameters are consistent with Grade II  diastolic dysfunction (pseudonormal pattern).    * Right ventricular size and systolic function are normal.    * Mild bi-atrial enlargement.    * Moderate to severe mitral valve regurgitation.    * There is mild tricuspid regurgitation.    * Moderate pulmonary hypertension with estimated right ventricular systolic  pressure of  63.29 mmHg.  EKG 02/09/17:   AF with RVR at 130 bpm  NS STT abnormalities  BNP 02/10/17:  2921  Subjective:   He has dyspnea with minimal exertion. Denies chest pain.    Medications:     Current Facility-Administered Medications   Medication Dose Route Frequency   . aspirin  81 mg Oral Daily   . atorvastatin   80 mg Oral QHS   . carvedilol  3.125 mg Oral Q12H SCH   . lisinopril  10 mg Oral Q12H SCH   . pantoprazole  40 mg Oral BID         Physical Exam:     Vitals:    02/21/17 0850   BP: 106/58   Pulse: 65   Resp:    Temp:    SpO2:        Intake and Output Summary (Last 24 hours) at Date Time    Intake/Output Summary (Last 24 hours) at 02/21/17 1034  Last data filed at 02/21/17 0847   Gross per 24 hour   Intake              350 ml   Output                0 ml   Net              350 ml       General appearance - in no distress   Head: NC/AT, mm moist  Neck -  supple, Carotids 2+, no bruit, no JVD   Chest - clear to auscultation, no wheezes, rales or rhonchi, symmetric air entry   Heart - normal rate and regular rhythm, S1 and S2 normal, no murmurs   Abdomen - soft, nontender, nondistended, no masses or organomegaly   Extremities - peripheral pulses normal, no pedal edema, no clubbing or cyanosis        Labs:   Recent CBC   Recent Labs      02/21/17   0548   RBC  3.21*   Hgb  8.0*   Hematocrit  27.6*   MCV  86.0   MCH  24.9*   MCHC  29.0*   RDW  15   MPV  10.4   Platelets  278     Recent BMP   Recent Labs      02/21/17   0549   Glucose  91   BUN  29.0*   Creatinine  1.2   Calcium  8.1   Sodium  133*   Potassium  4.3   Chloride  106   CO2  22     Recent CMP   Recent Labs      02/21/17   0549   Glucose  91   BUN  29.0*   Creatinine  1.2   Sodium  133*   CO2  22   Calcium  8.1   Albumin  2.5*   AST (SGOT)  13   ALT  12   Globulin  2.8     Recent CARDIAC ENZYMES No results for input(s): TROPONIN, ISTATTROPONI, CK, CKMB in the last 24 hours.    Invalid input(s): TROPONINT  Recent TSH No results for input(s): TSH in the last 24 hours.  Recent URIC ACID Invalid input(s): URIC  Recent BLOOD CULTURE No results for input(s): CXBLD in the last 24 hours.  Recent URINE CULTURE Invalid input(s): CXURN  Recent OCCULT BLOOD Invalid input(s): OCCBL  Recent PT/PTT No results for input(s): INR, PTT in the last 24 hours.    Invalid input(s):  PTI, COUM, COUMP, ACOAG, ACOAP  Recent PT/INR No results for input(s): INR in the last 24 hours.    Invalid input(s): PTI, COUM, COUMP  Recent LIPID PANEL No results for input(s): CHOL, TRIG, HDL in the last 24 hours.    Invalid input(s): LDLC, VLDLC, LRAT  Recent ABG No results for input(s): TEMP, FIO2, RATE, MODE, ETCO2, PEEP in the last 24 hours.    Invalid input(s): APH, APCO2, APO2, AHCO3, ATCO2, ABE, AOSAT, ABGS, ALLEN, STATS, O2DEL, O2FLO, PRESS, VNTMN, PRSUP, TIVOL      Rads:   Radiological Procedure reviewed.    Signed by: Valerie Roys, MD, [facility]

## 2017-02-21 NOTE — Progress Notes (Signed)
Medicine Shift Note    Patient Lines/Drains/Airways Status    Active Lines, Drains and Airways     Name:   Placement date:   Placement time:   Site:   Days:    Peripheral IV 02/09/17 Left Hand  02/09/17    1805    Hand    11    Peripheral IV 02/18/17 Left Antecubital  02/18/17    1153    Antecubital    2                Last BM: 12/1    Pending Orders: No    Discharge Plan: TBD    POC: Pt    Skin: cardiac cath sites    Tele: Yes, extensive cardiac hx    Activity: standby assist    Interpreter Needs: No    Shift Note: AOx4, BP low (90s/40s) held HS lisinopril, pt refused midnight vitals.

## 2017-02-22 ENCOUNTER — Encounter
Admission: EM | Disposition: A | Discharge status: Home / Self Care | Payer: Self-pay | Source: Home / Self Care | Attending: Specialist

## 2017-02-22 DIAGNOSIS — D638 Anemia in other chronic diseases classified elsewhere: Secondary | ICD-10-CM | POA: Insufficient documentation

## 2017-02-22 DIAGNOSIS — D508 Other iron deficiency anemias: Secondary | ICD-10-CM | POA: Insufficient documentation

## 2017-02-22 LAB — CBC AND DIFFERENTIAL
Absolute NRBC: 0 10*3/uL
Basophils Absolute Automated: 0.04 10*3/uL (ref 0.00–0.20)
Basophils Automated: 0.6 %
Eosinophils Absolute Automated: 0.26 10*3/uL (ref 0.00–0.70)
Eosinophils Automated: 4 %
Hematocrit: 26.1 % — ABNORMAL LOW (ref 42.0–52.0)
Hgb: 7.7 g/dL — ABNORMAL LOW (ref 13.0–17.0)
Immature Granulocytes Absolute: 0.03 10*3/uL
Immature Granulocytes: 0.5 %
Lymphocytes Absolute Automated: 0.57 10*3/uL (ref 0.50–4.40)
Lymphocytes Automated: 8.8 %
MCH: 25.2 pg — ABNORMAL LOW (ref 28.0–32.0)
MCHC: 29.5 g/dL — ABNORMAL LOW (ref 32.0–36.0)
MCV: 85.6 fL (ref 80.0–100.0)
MPV: 10.5 fL (ref 9.4–12.3)
Monocytes Absolute Automated: 0.8 10*3/uL (ref 0.00–1.20)
Monocytes: 12.4 %
Neutrophils Absolute: 4.75 10*3/uL (ref 1.80–8.10)
Neutrophils: 73.7 %
Nucleated RBC: 0 /100 WBC (ref 0.0–1.0)
Platelets: 293 10*3/uL (ref 140–400)
RBC: 3.05 10*6/uL — ABNORMAL LOW (ref 4.70–6.00)
RDW: 15 % (ref 12–15)
WBC: 6.45 10*3/uL (ref 3.50–10.80)

## 2017-02-22 LAB — COMPREHENSIVE METABOLIC PANEL
ALT: 17 U/L (ref 0–55)
AST (SGOT): 22 U/L (ref 5–34)
Albumin/Globulin Ratio: 0.8 — ABNORMAL LOW (ref 0.9–2.2)
Albumin: 2.4 g/dL — ABNORMAL LOW (ref 3.5–5.0)
Alkaline Phosphatase: 69 U/L (ref 38–106)
BUN: 28 mg/dL (ref 9.0–28.0)
Bilirubin, Total: 0.2 mg/dL (ref 0.2–1.2)
CO2: 18 mEq/L — ABNORMAL LOW (ref 22–29)
Calcium: 8.3 mg/dL (ref 7.9–10.2)
Chloride: 109 mEq/L (ref 100–111)
Creatinine: 1 mg/dL (ref 0.7–1.3)
Globulin: 2.9 g/dL (ref 2.0–3.6)
Glucose: 96 mg/dL (ref 70–100)
Potassium: 4.8 mEq/L (ref 3.5–5.1)
Protein, Total: 5.3 g/dL — ABNORMAL LOW (ref 6.0–8.3)
Sodium: 136 mEq/L (ref 136–145)

## 2017-02-22 LAB — SERUM PROTEIN ELECTROPHORESIS REVIEW

## 2017-02-22 LAB — PROTEIN ELECTROPHORESIS, SERUM
Albumin %: 45.3 % — ABNORMAL LOW (ref 46.6–62.6)
Albumin, Synovial: 2.5 g/dL — ABNORMAL LOW (ref 3.4–4.8)
Alpha-1 Glob %: 5.8 % — ABNORMAL HIGH (ref 1.7–4.1)
Alpha-1 Globulin: 0.3 g/dL (ref 0.1–0.4)
Alpha-2 Glob %: 17.7 % — ABNORMAL HIGH (ref 8.9–14.9)
Alpha-2 Globulin: 1 g/dL (ref 0.8–1.2)
Beta Glob %: 16.4 % (ref 10.9–18.9)
Beta Globulin: 0.9 g/dL (ref 0.6–1.2)
Gamma Globulin %: 14.8 % (ref 9.8–24.4)
Gamma Globulin: 0.8 g/dL (ref 0.6–1.7)
Protein, Total: 5.5 g/dL — ABNORMAL LOW (ref 6.0–8.3)

## 2017-02-22 LAB — KAPPA/LAMBDA LIGHT CHAIN, TOTAL
Kappa, Quantitative: 243 mg/dL (ref 176–443)
Kappa/Lambda Ratio: 1.81 (ref 1.29–2.55)
Lambda, Quantitative: 134 mg/dL (ref 91–240)

## 2017-02-22 LAB — IFE REVIEW, SERUM

## 2017-02-22 LAB — GFR: EGFR: >60

## 2017-02-22 LAB — IMMUNOFIXATION ELECTROPHORESIS

## 2017-02-22 SURGERY — PTCA FOR CTO
Anesthesia: Conscious Sedation | Laterality: Left

## 2017-02-22 MED ORDER — NITROGLYCERIN IN D5W 200-5 MCG/ML-% IV SOLN
INTRAVENOUS | Status: AC
Start: 2017-02-22 — End: 2017-02-22
  Administered 2017-02-22: 11:00:00 400 ug via INTRACARDIAC
  Filled 2017-02-22: qty 250

## 2017-02-22 MED ORDER — HEPARIN SODIUM (PORCINE) 1000 UNIT/ML IJ SOLN
INTRAMUSCULAR | Status: AC
Start: 2017-02-22 — End: 2017-02-22
  Administered 2017-02-22: 11:00:00 10000 [IU] via INTRAVENOUS
  Filled 2017-02-22: qty 10

## 2017-02-22 MED ORDER — IODIXANOL 320 MG/ML IV SOLN
170.00 mL | Freq: Once | INTRAVENOUS | Status: AC | PRN
Start: 2017-02-22 — End: 2017-02-22
  Administered 2017-02-22: 11:00:00 170 mL via INTRA_ARTERIAL

## 2017-02-22 MED ORDER — MIDAZOLAM HCL 2 MG/2ML IJ SOLN
INTRAMUSCULAR | Status: AC
Start: 2017-02-22 — End: 2017-02-22
  Administered 2017-02-22: 11:00:00 1 mg via INTRAVENOUS
  Filled 2017-02-22: qty 2

## 2017-02-22 MED ORDER — LIDOCAINE HCL (PF) 1 % IJ SOLN
INTRAMUSCULAR | Status: AC
Start: 2017-02-22 — End: 2017-02-22
  Filled 2017-02-22: qty 30

## 2017-02-22 MED ORDER — HEPARIN WASH BOWL 5 UNITS/ML SOLN (CATH LAB)
Status: AC
Start: 2017-02-22 — End: 2017-02-22
  Filled 2017-02-22: qty 2000

## 2017-02-22 MED ORDER — CLOPIDOGREL BISULFATE 75 MG PO TABS
75.00 mg | ORAL_TABLET | Freq: Every day | ORAL | Status: DC
Start: 2017-02-23 — End: 2017-02-23
  Administered 2017-02-23: 10:00:00 75 mg via ORAL
  Filled 2017-02-22: qty 1

## 2017-02-22 MED ORDER — CYANOCOBALAMIN 1000 MCG/ML IJ SOLN
1000.00 ug | Freq: Every day | INTRAMUSCULAR | Status: DC
Start: 2017-02-22 — End: 2017-02-23
  Administered 2017-02-22 – 2017-02-23 (×2): 1000 ug via INTRAMUSCULAR
  Filled 2017-02-22 (×2): qty 1

## 2017-02-22 MED ORDER — ATROPINE SULFATE 0.1 MG/ML IJ SOLN (WRAP)
INTRAMUSCULAR | Status: DC
Start: 2017-02-22 — End: 2017-02-22
  Filled 2017-02-22: qty 10

## 2017-02-22 MED ORDER — IRON SUCROSE 20 MG/ML IV SOLN
200.00 mg | INTRAVENOUS | Status: DC
Start: 2017-02-22 — End: 2017-02-23
  Administered 2017-02-22 – 2017-02-23 (×2): 200 mg via INTRAVENOUS
  Filled 2017-02-22 (×3): qty 10

## 2017-02-22 MED ORDER — FENTANYL CITRATE (PF) 50 MCG/ML IJ SOLN (WRAP)
INTRAMUSCULAR | Status: AC
Start: 2017-02-22 — End: 2017-02-22
  Administered 2017-02-22: 11:00:00 50 ug via INTRAVENOUS
  Filled 2017-02-22: qty 2

## 2017-02-22 MED ORDER — CLOPIDOGREL BISULFATE 300 MG PO TABS
ORAL_TABLET | ORAL | Status: AC
Start: 2017-02-22 — End: 2017-02-22
  Administered 2017-02-22: 11:00:00 600 mg via ORAL
  Filled 2017-02-22: qty 2

## 2017-02-22 NOTE — Progress Notes (Signed)
PROGRESS NOTE    Date Time: 02/22/17 8:20 AM  Patient Name: [name],[name]      Assessment:   1.Anemia  2.Chronic ulcerative colitis   3.AKI  4.Afib  5.CHF  Plan:   -B12 and Ferritin borderline, will start pt on B12 1000 mcg IM daily X 7 days  -Venofer 200 mg IV x 3 doses  -Pending Myeloma Panel  -planning for PCI on Wednesday by  DR Karsten Ro   -Will cont to monitor CBC  Cont plan of care  All labs, notes and past 24 hrs events reviewed   Case was DW pt   Subjective:   Pt was seen at bedside, feels OK, no CP or SOB, no N/V/D, no other GI or GU symptoms   Medications:     Current Facility-Administered Medications   Medication Dose Route Frequency   . aspirin  81 mg Oral Daily   . atorvastatin  80 mg Oral QHS   . carvedilol  3.125 mg Oral Q12H SCH   . lisinopril  10 mg Oral Q12H SCH   . pantoprazole  40 mg Oral BID       Review of Systems:   A comprehensive review of systems was:  Ten point review of systems negative or as per above    Physical Exam:     Vitals:    02/22/17 0713   BP: 112/61   Pulse: 61   Resp: 18   Temp: 98 F (36.7 C)   SpO2: 96%       Intake and Output Summary (Last 24 hours) at Date Time    Intake/Output Summary (Last 24 hours) at 02/22/17 0820  Last data filed at 02/22/17 0100   Gross per 24 hour   Intake              778 ml   Output              500 ml   Net              278 ml       General appearance - alert, well appearing, and in no distress  Mouth - mucous membranes moist, pharynx normal without lesions  Neck - supple, no significant adenopathy  Lymphatics - no palpable lymphadenopathy, no hepatosplenomegaly  Chest - clear to auscultation, no wheezes, rales or rhonchi, symmetric air entry  Heart - S1 and S2 normal, no murmurs noted, no gallops noted, no JVD, tachycardic   Abdomen - soft, nontender, nondistended, no masses or organomegaly  Neurological - alert, oriented, normal speech, no focal findings or movement disorder noted  Musculoskeletal - no joint tenderness, deformity or  swelling  Extremities - peripheral pulses normal, no pedal edema, no clubbing or cyanosis  Skin - normal coloration and turgor, no rashes, no suspicious skin lesions noted      Labs:     Results     Procedure Component Value Units Date/Time    Comprehensive metabolic panel [[id number]]  (Abnormal) Collected:  02/22/17 0440    Specimen:  Blood Updated:  02/22/17 0542     Glucose 96 mg/dL      BUN 40.9 mg/dL      Creatinine 1.0 mg/dL      Sodium 811 mEq/L      Potassium 4.8 mEq/L      Chloride 109 mEq/L      CO2 18 (L) mEq/L      Calcium 8.3 mg/dL      Protein, Total 5.3 (  L) g/dL      Albumin 2.4 (L) g/dL      AST (SGOT) 22 U/L      ALT 17 U/L      Alkaline Phosphatase 69 U/L      Bilirubin, Total 0.2 mg/dL      Globulin 2.9 g/dL      Albumin/Globulin Ratio 0.8 (L)    GFR [[id number]] Collected:  02/22/17 0440     Updated:  02/22/17 0542     EGFR >60.0    CBC and differential [[id number]]  (Abnormal) Collected:  02/22/17 0440    Specimen:  Blood from Blood Updated:  02/22/17 0517     WBC 6.45 x10 3/uL      Hgb 7.7 (L) g/dL      Hematocrit 78.2 (L) %      Platelets 293 x10 3/uL      RBC 3.05 (L) x10 6/uL      MCV 85.6 fL      MCH 25.2 (L) pg      MCHC 29.5 (L) g/dL      RDW 15 %      MPV 10.5 fL      Neutrophils 73.7 %      Lymphocytes Automated 8.8 %      Monocytes 12.4 %      Eosinophils Automated 4.0 %      Basophils Automated 0.6 %      Immature Granulocyte 0.5 %      Nucleated RBC 0.0 /100 WBC      Neutrophils Absolute 4.75 x10 3/uL      Abs Lymph Automated 0.57 x10 3/uL      Abs Mono Automated 0.80 x10 3/uL      Abs Eos Automated 0.26 x10 3/uL      Absolute Baso Automated 0.04 x10 3/uL      Absolute Immature Granulocyte 0.03 x10 3/uL      Absolute NRBC 0.00 x10 3/uL     Folate [[id number]] Collected:  02/21/17 1422    Specimen:  Blood Updated:  02/21/17 2009     Folate 8.8 ng/mL     Vitamin B12 [[id number]]  (Abnormal) Collected:  02/21/17 1422    Specimen:  Blood Updated:  02/21/17 2009     Vitamin B-12 205 (L)  pg/mL     Ferritin [[id number]] Collected:  02/21/17 1422    Specimen:  Blood Updated:  02/21/17 1956     Ferritin 36.53 ng/mL     Protein electrophoresis, serum [[id number]]  (Abnormal) Collected:  02/21/17 1422    Specimen:  Blood Updated:  02/21/17 1936     Protein, Total 5.5 (L) g/dL     Hemolysis index [[id number]] Collected:  02/21/17 1422     Updated:  02/21/17 1928     Hemolysis Index 11    Immunofixation electrophoresis, Serum [[id number]] Collected:  02/21/17 1422    Specimen:  Blood Updated:  02/21/17 1917    Sedimentation rate (ESR) [[id number]]  (Abnormal) Collected:  02/21/17 1422    Specimen:  Blood Updated:  02/21/17 1624     Sed Rate 63 (H) mm/Hr     Reticulocytes [[id number]]  (Abnormal) Collected:  02/21/17 1422    Specimen:  Blood Updated:  02/21/17 1530     Reticulocyte Count Automated 1.7 %      Retic Ct Abs 0.0535 x10 6/uL      Immature Retic Fract 13.3 %      Retic Hgb 16.9 (L) pg  Kappa/Lambda Light Chain, Total [[id number]] Collected:  02/21/17 1422     Updated:  02/21/17 1453            Rads:   No results found.  Radiological Procedure reviewed.    Signed by: Zarek Bellow

## 2017-02-22 NOTE — Progress Notes (Signed)
Pt given stent ID card, perclose pamphlet, and cardiac rehab packet, reviewed all info w/ pt, pt verb understanding.

## 2017-02-22 NOTE — Progress Notes (Signed)
Patient admitted to [facility] room 10 for PTCA for CTO with Dr. Karsten Ro.  Oriented to unit and plan of care. Call bell at bedside.  Appropriate pre-procedure and documentation verified.    Post procedure teachings given.  Maintained npo status    Awaiting MD to see patient and consent  before pt ready for procedure.

## 2017-02-22 NOTE — Brief Op Note (Signed)
Successful CTO intervention of the LCX with DES

## 2017-02-22 NOTE — Plan of Care (Signed)
Problem: Moderate/High Fall Risk Score >5  Goal: Patient will remain free of falls  Outcome: Progressing   02/22/17 0447   OTHER   Moderate Risk (6-13) LOW-Fall Interventions Appropriate for Low Fall Risk       Problem: Hemodynamic Status: Cardiac  Goal: Stable vital signs and fluid balance  Outcome: Progressing   02/21/17 0107   Goal/Interventions addressed this shift   Stable vital signs and fluid balance Monitor/assess vital signs and telemetry per unit protocol;Weigh on admission and record weight daily;Assess signs and symptoms associated with cardiac rhythm changes;Monitor lab values;Monitor for leg swelling/edema and report to LIP if abnormal       Comments: AXOX3-4, forgetful, drowsy.  MAE, generalized weakness..  VS WNL, SR RA.  Refused MN VS held Lisinopril for SPB  borderlined 100.  Denied pain, no SS bleeding.   NPO for PTCA in AM.

## 2017-02-22 NOTE — Progress Notes (Signed)
PROGRESS NOTE    Date Time: 02/22/17 10:20 AM  Patient Name: [name],[name]  Attending Physician: Schuyler Amor, MD    Assessment:   Symptomatic anemia   Tachycardia   Hypotension   Chronic ulcerative colitis   AKI multifactorial   New onset atrial fibrillation   Acute on chronic systolic HFrEF  Plan:   PCI today   Hematology work up for anemia   Ambulate with PT and OT  Continue PPI   SCD for DVT prophylaxis   Case d/w patient and staff care coordinated   History of Present Illness:   [name] is a 83 y.o. male who presents to the hospital with abnormal labs with severe anemia in ED he was hypotensive seen by [facility] for evaluation of admission to ICU however his repeat BP showed improvement and he is  admitted to [facility]   He c/o feeling weak and dizziness   Denies any abdominal pain , nausea vomiting   No hematemesis melena or hematochezia  No hemoptysis, cough or fever   No cp or SOB   Rest of ROS  negative    Patient was seen by me in the ED last night however he became hypotensive and tachycardiac and [facility] was consulted for possible ICU admission   Past Medical History:     Past Medical History:   Diagnosis Date   . Anemia     iron low   . DOE (dyspnea on exertion)     mild   . Glaucoma NEC    . Hypertensive disorder    . Skin cancer     2006 removed mole   . Ulcerative colitis, chronic    . Vision abnormalities        Past Surgical History:     Past Surgical History:   Procedure Laterality Date   . EGD, COLONOSCOPY  04/03/2012    Procedure: EGD, COLONOSCOPY;  Surgeon: Lestine Mount, MD;  Location: [facility];  Service: Gastroenterology;  Laterality: N/A;   . EGD, COLONOSCOPY N/A 02/11/2017    Procedure: EGD, COLONOSCOPY;  Surgeon: Lestine Mount, MD;  Location: [facility];  Service: Gastroenterology;  Laterality: N/A;   . EYE SURGERY      cataract       Family History:   History reviewed. No pertinent family history.    Social History:     Social History     Social History   . Marital status: Married      Spouse name: N/A   . Number of children: N/A   . Years of education: N/A     Social History Main Topics   . Smoking status: Former Smoker     Packs/day: 1.00     Years: 12.00     Quit date: 12/18/1961   . Smokeless tobacco: Never Used   . Alcohol use 0.0 oz/week      Comment: 0.5oz/d   . Drug use: No   . Sexual activity: Not on file     Other Topics Concern   . Not on file     Social History Narrative   . No narrative on file       Allergies:   No Known Allergies    Medications:     Prescriptions Prior to Admission   Medication Sig   . IRON PO Take 200 mg by mouth daily.   . [DISCONTINUED] lisinopril (PRINIVIL,ZESTRIL) 5 MG tablet Take by mouth daily.       Review of  Systems:   A comprehensive review of systems was: per HPI     Physical Exam:     Vitals:    02/22/17 0713   BP: 112/61   Pulse: 61   Resp: 18   Temp: 98 F (36.7 C)   SpO2: 96%       Intake and Output Summary (Last 24 hours) at Date Time    Intake/Output Summary (Last 24 hours) at 02/22/17 1020  Last data filed at 02/22/17 0100   Gross per 24 hour   Intake              428 ml   Output              500 ml   Net              -72 ml       General appearance - alert, well appearing, and in no distress  Mouth - mucous membranes moist, pharynx normal without lesions  Neck - supple, no significant adenopathy  Lymphatics - no palpable lymphadenopathy, no hepatosplenomegaly  Chest - clear to auscultation, no wheezes, rales or rhonchi, symmetric air entry  Heart - S1 and S2 normal, no murmurs noted, no gallops noted, no JVD, tachycardic   Abdomen - soft, nontender, nondistended, no masses or organomegaly  Neurological - alert, oriented, normal speech, no focal findings or movement disorder noted  Musculoskeletal - no joint tenderness, deformity or swelling  Extremities - peripheral pulses normal, no pedal edema, no clubbing or cyanosis  Skin - normal coloration and turgor, no rashes, no suspicious skin lesions noted         Labs:     Results     Procedure  Component Value Units Date/Time    Comprehensive metabolic panel [[id number]]  (Abnormal) Collected:  02/22/17 0440    Specimen:  Blood Updated:  02/22/17 0542     Glucose 96 mg/dL      BUN 40.9 mg/dL      Creatinine 1.0 mg/dL      Sodium 811 mEq/L      Potassium 4.8 mEq/L      Chloride 109 mEq/L      CO2 18 (L) mEq/L      Calcium 8.3 mg/dL      Protein, Total 5.3 (L) g/dL      Albumin 2.4 (L) g/dL      AST (SGOT) 22 U/L      ALT 17 U/L      Alkaline Phosphatase 69 U/L      Bilirubin, Total 0.2 mg/dL      Globulin 2.9 g/dL      Albumin/Globulin Ratio 0.8 (L)    GFR [[id number]] Collected:  02/22/17 0440     Updated:  02/22/17 0542     EGFR >60.0    CBC and differential [[id number]]  (Abnormal) Collected:  02/22/17 0440    Specimen:  Blood from Blood Updated:  02/22/17 0517     WBC 6.45 x10 3/uL      Hgb 7.7 (L) g/dL      Hematocrit 46.9 (L) %      Platelets 293 x10 3/uL      RBC 3.05 (L) x10 6/uL      MCV 85.6 fL      MCH 25.2 (L) pg      MCHC 29.5 (L) g/dL      RDW 15 %      MPV 10.5 fL      Neutrophils 73.7 %  Lymphocytes Automated 8.8 %      Monocytes 12.4 %      Eosinophils Automated 4.0 %      Basophils Automated 0.6 %      Immature Granulocyte 0.5 %      Nucleated RBC 0.0 /100 WBC      Neutrophils Absolute 4.75 x10 3/uL      Abs Lymph Automated 0.57 x10 3/uL      Abs Mono Automated 0.80 x10 3/uL      Abs Eos Automated 0.26 x10 3/uL      Absolute Baso Automated 0.04 x10 3/uL      Absolute Immature Granulocyte 0.03 x10 3/uL      Absolute NRBC 0.00 x10 3/uL     Folate [[id number]] Collected:  02/21/17 1422    Specimen:  Blood Updated:  02/21/17 2009     Folate 8.8 ng/mL     Vitamin B12 [[id number]]  (Abnormal) Collected:  02/21/17 1422    Specimen:  Blood Updated:  02/21/17 2009     Vitamin B-12 205 (L) pg/mL     Ferritin [[id number]] Collected:  02/21/17 1422    Specimen:  Blood Updated:  02/21/17 1956     Ferritin 36.53 ng/mL     Protein electrophoresis, serum [[id number]]  (Abnormal) Collected:  02/21/17 1422     Specimen:  Blood Updated:  02/21/17 1936     Protein, Total 5.5 (L) g/dL     Hemolysis index [[id number]] Collected:  02/21/17 1422     Updated:  02/21/17 1928     Hemolysis Index 11    Immunofixation electrophoresis, Serum [[id number]] Collected:  02/21/17 1422    Specimen:  Blood Updated:  02/21/17 1917    Sedimentation rate (ESR) [[id number]]  (Abnormal) Collected:  02/21/17 1422    Specimen:  Blood Updated:  02/21/17 1624     Sed Rate 63 (H) mm/Hr     Reticulocytes [[id number]]  (Abnormal) Collected:  02/21/17 1422    Specimen:  Blood Updated:  02/21/17 1530     Reticulocyte Count Automated 1.7 %      Retic Ct Abs 0.0535 x10 6/uL      Immature Retic Fract 13.3 %      Retic Hgb 16.9 (L) pg     Kappa/Lambda Light Chain, Total [[id number]] Collected:  02/21/17 1422     Updated:  02/21/17 1453              Rads:   Radiological Procedure reviewed.    Signed by: Schuyler Amor MD  [phone]

## 2017-02-22 NOTE — Progress Notes (Addendum)
Received report and pt to rm35 s/p interventional LHC by Dr. Karsten Ro. Verified pt ID band. R groin site (gauze/tegaderm)=CDI, soft around site, no oozing or hematoma, pt denies CP or SOB, pt denies pain or tenderness at RFA puncture site, pt denies RLE numbness/coolness/pain/discomfort, bilateral pedal and tibial pulses verified by doppler. RRA site (TR band/armboard)=CDI, soft around site, no oozing or hematoma but noted ecchymosis, pt denies CP or SOB, pt denies pain or tenderness at RRA puncture site, pt denies RUE numbness/coolness/pain/discomfort, palpable R radial and ulnar pulses.    Pt is drowsy but easily arousable, Ox4, follows commands, afebrile, VSS, SR on tele, HR=60s, RR=16, 02 sat=100% on 3L NC, resp easy and unlabored at rest, breath sounds=clear, currently asymptomatic, no s/s of distress.     Educated pt r/t post-procedure instructions including RUE/RLE mvt restrictions, bedrest x4hrs, bleeding precautions, and fall precautions, instructed pt to report new onset s/s immediately via call bell, pt verb understanding.     Pt oriented to rm, call bell w/in reach, and bed in lowest position. Pt close to nurse's station, will continue to provide close observation.

## 2017-02-22 NOTE — Progress Notes (Signed)
CATH LAB PROCEDURE HANDOFF REPORT    Date Time: 02/22/17 10:00 AM    INDICATIONS:    chest pain  PROCEDURE:    Left heart cath; Possible percutaneous coronary intervention  ALLERGIES:    Patient has no known allergies.   ACC BLEEDING RISK SCORE   Total Score: 50 = HIGH RISK for bleeding (greater than 3.1%)   MEDICAL HISTORY:      Past Medical History:   Diagnosis Date   . Anemia     iron low   . DOE (dyspnea on exertion)     mild   . Glaucoma NEC    . Hypertensive disorder    . Skin cancer     2006 removed mole   . Ulcerative colitis, chronic    . Vision abnormalities       ACCESS:    36F in right radial artery, 87F sheath in right femoral artery  Hemostasis: TR band, 11 cc of air, Perclose  Post procedure pulses: 1+ in right arm/hand, right leg/foot  Visual appearance: clean/dry/intact with good distal pulses   MEDICATIONS:    Versed: 2 mg IV  Fentanyl: 50 mcg IV  Heparin:  16109 units IV  Nitro: 400 mcg IC  Loading Dose of: Clopidogrel 600 mg PO  VITALS:    BP: 99/54          Pulse: 66          O2 SAT: 99%       Rhythm: sinus rhythm  PROCEDURE DETAILS:    Outcomes: successful PCI to CIRC artery                                     Last ACT:  219    Complications: none    Final Chest Pain Assessment::0/10    Report given to: Renea Ee, RN      See Physicians Op note/ Report for details

## 2017-02-22 NOTE — Progress Notes (Signed)
Medicine Shift Note    Patient Lines/Drains/Airways Status    Active Lines, Drains and Airways     Name:   Placement date:   Placement time:   Site:   Days:    Peripheral IV 02/09/17 Left Hand  02/09/17    1805    Hand    12    Peripheral IV 02/18/17 Left Antecubital  02/18/17    1153    Antecubital    3                Last BM: 12/2    Pending Orders: cath lab  Discharge Plan:  home  POC: Daughter  Skin: intact  Tele: Hx AF/VT  Activity: stand by assist    Interpreter Needs:No  Shift Note: see POC

## 2017-02-22 NOTE — Progress Notes (Signed)
02/22/17 0759   Bleeding Risk Score   Acute STEMI No   Previous PCI No   PCI Status Urgent   Chronic Kidney Disease No   Cardiac Arrest within 24 hour No   Shock No   Age Category greater than 70 years   Gender Category Male   BMI Category 20-30   Hemoglobin Category 13.0 to less than 15.0   Total Score 50 = HIGH RISK for bleeding (greater than 3.1%)

## 2017-02-22 NOTE — H&P (Signed)
H&P UPDATE WITH ASA/MALLAMPATTI    Date Time: 02/22/17 7:59 AM    PROCEDURE:    Left heart cath, possible percutaneous coronary intervention  INDICATIONS:    chest pain  H&P:    The history and physical including past medical, family, and social history were reviewed   and there are no significant interval changes from what is currently available in the chart  from prior evaluation. He has no complaints.  He was seen and examined by me prior   to the procedure.   ALLERGIES:    Patient has no known allergies.   LABS:      Lab Results   Component Value Date    WBC 6.45 02/22/2017    HGB 7.7 (L) 02/22/2017    HCT 26.1 (L) 02/22/2017    PLT 293 02/22/2017    NA 136 02/22/2017    K 4.8 02/22/2017    CL 109 02/22/2017    CO2 18 (L) 02/22/2017    MG 2.0 02/15/2017    BUN 28.0 02/22/2017    CREAT 1.0 02/22/2017    EGFR >60.0 02/22/2017    GLU 96 02/22/2017     ASA PHYSICAL STATUS    Class 3 - Severe systemic disease, limits normal activity but not incapacitating  MALLAMPATTI AIRWAY CLASSIFICATION    Class II: Visibility of hard and soft palate, upper portion of tonsils and uvula  ACC BLEEDING RISK SCORE   Total Score: 35 = INTERMEDIATE RISK for bleeding (1.1%  to 3.1%)  PLANNED SEDATION:    ( ) NO SEDATION   (x) MODERATE SEDATION   ( ) DEEP SEDATION WITH ANESTHESIA   CONCLUSION:    The risks, benefits and alternatives of the procedure have been discussed in detail and   he has indicated that he understands the procedure, indications, and risks inherent to the   procedure and is amenable to proceeding.  All questions were answered. Informed   consent was signed and verified.      Signed by: Unice Bailey, MD

## 2017-02-22 NOTE — Progress Notes (Signed)
[facility]   Leoda Smithhart M. Lucianne Muss, MD, [facility]  PROGRESS NOTE  Office: [phone]  Cell phone [phone]                                       Date Time: 02/22/17 6:49 AM  Patient Name: [name],[name]      Assessment:    Ventricular tachycardia, non-sustained, recorded on tele on 02/10/17 at 12:07- asymptomatic and hemodynamically stable.    Atrial fibrillation, new onset, with rapid ventricular response. Converted to NSR spontaneously. Currently in SR. Patient with high risk of rebleed with use of anticoagulation given multiple AVMs (stomach, duodenum and cecum) noted on endoscopic procedures.   CHF with reduced EF of 36%   Severe pulmonary hypertension with an estimated PASP of 63 mmHg by echo   GI bleeding, no acute bleed.   Anemia, H/H stable.    CKD stage III- Cr stable.    Dyslipidemia   Plan:    ASA 81 mg daily   Lipitor 80 mg qhs   Coreg 3.125 mg bid   Lisinopril 10 mg bid- this would be the max dose of ACEI for him.    PCI/stent to LM/LAD by Dr. Karsten Ro this morning  Recent Cardiac Work up:   Echo 02/11/17:   The left ventricle is normal in size.    * Left ventricular systolic function is moderately decreased with an  ejection fraction by Biplane Method of Discs of  36 %.    * Left Ventricular diastolic filling parameters are consistent with Grade II  diastolic dysfunction (pseudonormal pattern).    * Right ventricular size and systolic function are normal.    * Mild bi-atrial enlargement.    * Moderate to severe mitral valve regurgitation.    * There is mild tricuspid regurgitation.    * Moderate pulmonary hypertension with estimated right ventricular systolic  pressure of  63.29 mmHg.  EKG 02/09/17:   AF with RVR at 130 bpm  NS STT abnormalities  BNP 02/10/17:  2921  Subjective:   He has dyspnea with minimal exertion. Denies chest pain.    Medications:     Current Facility-Administered Medications   Medication Dose Route Frequency   . aspirin  81 mg Oral Daily   . atorvastatin  80  mg Oral QHS   . carvedilol  3.125 mg Oral Q12H SCH   . lisinopril  10 mg Oral Q12H SCH   . pantoprazole  40 mg Oral BID         Physical Exam:     Vitals:    02/22/17 0557   BP: 100/56   Pulse: 63   Resp: 18   Temp: 98.2 F (36.8 C)   SpO2: 97%       Intake and Output Summary (Last 24 hours) at Date Time    Intake/Output Summary (Last 24 hours) at 02/22/17 0649  Last data filed at 02/22/17 0100   Gross per 24 hour   Intake              778 ml   Output              500 ml   Net              278 ml       General appearance - in no distress   Head: NC/AT, mm moist  Neck - supple, Carotids 2+, no bruit, no JVD   Chest - clear to auscultation, no wheezes, rales or rhonchi, symmetric air entry   Heart - normal rate and regular rhythm, S1 and S2 normal, no murmurs   Abdomen - soft, nontender, nondistended, no masses or organomegaly   Extremities - peripheral pulses normal, no pedal edema, no clubbing or cyanosis        Labs:   Recent CBC   Recent Labs      02/22/17   0440   RBC  3.05*   Hgb  7.7*   Hematocrit  26.1*   MCV  85.6   MCH  25.2*   MCHC  29.5*   RDW  15   MPV  10.5   Platelets  293     Recent BMP   Recent Labs      02/22/17   0440   Glucose  96   BUN  28.0   Creatinine  1.0   Calcium  8.3   Sodium  136   Potassium  4.8   Chloride  109   CO2  18*     Recent CMP   Recent Labs      02/22/17   0440   Glucose  96   BUN  28.0   Creatinine  1.0   Sodium  136   CO2  18*   Calcium  8.3   Albumin  2.4*   AST (SGOT)  22   ALT  17   Globulin  2.9     Recent CARDIAC ENZYMES No results for input(s): TROPONIN, ISTATTROPONI, CK, CKMB in the last 24 hours.    Invalid input(s): TROPONINT  Recent TSH No results for input(s): TSH in the last 24 hours.  Recent URIC ACID Invalid input(s): URIC  Recent BLOOD CULTURE No results for input(s): CXBLD in the last 24 hours.  Recent URINE CULTURE Invalid input(s): CXURN  Recent OCCULT BLOOD Invalid input(s): OCCBL  Recent PT/PTT No results for input(s): INR, PTT in the last 24  hours.    Invalid input(s): PTI, COUM, COUMP, ACOAG, ACOAP  Recent PT/INR No results for input(s): INR in the last 24 hours.    Invalid input(s): PTI, COUM, COUMP  Recent LIPID PANEL No results for input(s): CHOL, TRIG, HDL in the last 24 hours.    Invalid input(s): LDLC, VLDLC, LRAT  Recent ABG No results for input(s): TEMP, FIO2, RATE, MODE, ETCO2, PEEP in the last 24 hours.    Invalid input(s): APH, APCO2, APO2, AHCO3, ATCO2, ABE, AOSAT, ABGS, ALLEN, STATS, O2DEL, O2FLO, PRESS, VNTMN, PRSUP, TIVOL      Rads:   Radiological Procedure reviewed.    Signed by: Valerie Roys, MD, [facility]

## 2017-02-22 NOTE — Progress Notes (Signed)
Patient refused midnight vitals.

## 2017-02-23 LAB — CBC AND DIFFERENTIAL
Absolute NRBC: 0 10*3/uL
Basophils Absolute Automated: 0.06 10*3/uL (ref 0.00–0.20)
Basophils Automated: 0.7 %
Eosinophils Absolute Automated: 0.15 10*3/uL (ref 0.00–0.70)
Eosinophils Automated: 1.7 %
Hematocrit: 28.5 % — ABNORMAL LOW (ref 42.0–52.0)
Hgb: 8.2 g/dL — ABNORMAL LOW (ref 13.0–17.0)
Immature Granulocytes Absolute: 0.04 10*3/uL
Immature Granulocytes: 0.5 %
Lymphocytes Absolute Automated: 0.46 10*3/uL — ABNORMAL LOW (ref 0.50–4.40)
Lymphocytes Automated: 5.2 %
MCH: 24.6 pg — ABNORMAL LOW (ref 28.0–32.0)
MCHC: 28.8 g/dL — ABNORMAL LOW (ref 32.0–36.0)
MCV: 85.3 fL (ref 80.0–100.0)
MPV: 10.6 fL (ref 9.4–12.3)
Monocytes Absolute Automated: 1.01 10*3/uL (ref 0.00–1.20)
Monocytes: 11.4 %
Neutrophils Absolute: 7.13 10*3/uL (ref 1.80–8.10)
Neutrophils: 80.5 %
Nucleated RBC: 0 /100 WBC (ref 0.0–1.0)
Platelets: 383 10*3/uL (ref 140–400)
RBC: 3.34 10*6/uL — ABNORMAL LOW (ref 4.70–6.00)
RDW: 15 % (ref 12–15)
WBC: 8.85 10*3/uL (ref 3.50–10.80)

## 2017-02-23 LAB — COMPREHENSIVE METABOLIC PANEL
ALT: 13 U/L (ref 0–55)
AST (SGOT): 21 U/L (ref 5–34)
Albumin/Globulin Ratio: 0.9 (ref 0.9–2.2)
Albumin: 2.7 g/dL — ABNORMAL LOW (ref 3.5–5.0)
Alkaline Phosphatase: 82 U/L (ref 38–106)
BUN: 34 mg/dL — ABNORMAL HIGH (ref 9.0–28.0)
Bilirubin, Total: 0.3 mg/dL (ref 0.2–1.2)
CO2: 17 mEq/L — ABNORMAL LOW (ref 22–29)
Calcium: 8.7 mg/dL (ref 7.9–10.2)
Chloride: 107 mEq/L (ref 100–111)
Creatinine: 1.1 mg/dL (ref 0.7–1.3)
Globulin: 3.1 g/dL (ref 2.0–3.6)
Glucose: 101 mg/dL — ABNORMAL HIGH (ref 70–100)
Potassium: 4.6 mEq/L (ref 3.5–5.1)
Protein, Total: 5.8 g/dL — ABNORMAL LOW (ref 6.0–8.3)
Sodium: 135 mEq/L — ABNORMAL LOW (ref 136–145)

## 2017-02-23 LAB — GFR: EGFR: >60

## 2017-02-23 MED ORDER — LISINOPRIL 10 MG PO TABS
10.00 mg | ORAL_TABLET | Freq: Two times a day (BID) | ORAL | 0 refills | Status: DC
Start: 2017-02-23 — End: 2017-03-07

## 2017-02-23 MED ORDER — ATORVASTATIN CALCIUM 80 MG PO TABS
80.00 mg | ORAL_TABLET | Freq: Every evening | ORAL | 1 refills | Status: AC
Start: 2017-02-23 — End: ?

## 2017-02-23 MED ORDER — ASPIRIN 81 MG PO CHEW
81.00 mg | CHEWABLE_TABLET | Freq: Every day | ORAL | 1 refills | Status: DC
Start: 2017-02-24 — End: 2017-03-07

## 2017-02-23 MED ORDER — CARVEDILOL 3.125 MG PO TABS
3.1250 mg | ORAL_TABLET | Freq: Two times a day (BID) | ORAL | 0 refills | Status: AC
Start: 2017-02-23 — End: 2017-03-25

## 2017-02-23 MED ORDER — PANTOPRAZOLE SODIUM 40 MG PO TBEC
40.00 mg | DELAYED_RELEASE_TABLET | Freq: Two times a day (BID) | ORAL | 0 refills | Status: AC
Start: 2017-02-23 — End: 2017-03-25

## 2017-02-23 MED ORDER — CLOPIDOGREL BISULFATE 75 MG PO TABS
75.00 mg | ORAL_TABLET | Freq: Every day | ORAL | 1 refills | Status: DC
Start: 2017-02-24 — End: 2017-12-12

## 2017-02-23 NOTE — Progress Notes (Signed)
PROGRESS NOTE    Date Time: 02/23/17 10:44 AM  Patient Name: [name]  Attending Physician: Schuyler Amor, MD    Assessment:   Acute blood loss symptomatic anemia   Tachycardia   Hypotension   Chronic ulcerative colitis   AKI multifactorial   New onset atrial fibrillation   Acute on chronic systolic HFrEF  PCI DES to LM/LCx on 02/22/17   Plan:   S/p PCI on plavix and aspirin now   Continue PPI   H/h stable   Cleared by cardiology for discharge   SCD for DVT prophylaxis   Case d/w patient and staff care coordinated   History of Present Illness:   [name] is a 83 y.o. male who presents to the hospital with abnormal labs with severe anemia in ED he was hypotensive seen by [facility] for evaluation of admission to ICU however his repeat BP showed improvement and he is  admitted to [facility]   He c/o feeling weak and dizziness   Denies any abdominal pain , nausea vomiting   No hematemesis melena or hematochezia  No hemoptysis, cough or fever   No cp or SOB   Rest of ROS  negative    Patient was seen by me in the ED last night however he became hypotensive and tachycardiac and [facility] was consulted for possible ICU admission   Past Medical History:     Past Medical History:   Diagnosis Date   . Anemia     iron low   . DOE (dyspnea on exertion)     mild   . Glaucoma NEC    . Hypertensive disorder    . Skin cancer     2006 removed mole   . Ulcerative colitis, chronic    . Vision abnormalities        Past Surgical History:     Past Surgical History:   Procedure Laterality Date   . EGD, COLONOSCOPY  04/03/2012    Procedure: EGD, COLONOSCOPY;  Surgeon: Lestine Mount, MD;  Location: [facility];  Service: Gastroenterology;  Laterality: N/A;   . EGD, COLONOSCOPY N/A 02/11/2017    Procedure: EGD, COLONOSCOPY;  Surgeon: Lestine Mount, MD;  Location: [facility];  Service: Gastroenterology;  Laterality: N/A;   . EYE SURGERY      cataract       Family History:   History reviewed. No pertinent family history.    Social History:      Social History     Social History   . Marital status: Married     Spouse name: N/A   . Number of children: N/A   . Years of education: N/A     Social History Main Topics   . Smoking status: Former Smoker     Packs/day: 1.00     Years: 12.00     Quit date: 12/18/1961   . Smokeless tobacco: Never Used   . Alcohol use 0.0 oz/week      Comment: 0.5oz/d   . Drug use: No   . Sexual activity: Not on file     Other Topics Concern   . Not on file     Social History Narrative   . No narrative on file       Allergies:   No Known Allergies    Medications:     Prescriptions Prior to Admission   Medication Sig   . IRON PO Take 200 mg by mouth daily.   . [DISCONTINUED] lisinopril (PRINIVIL,ZESTRIL) 5  MG tablet Take by mouth daily.       Review of Systems:   A comprehensive review of systems was: per HPI     Physical Exam:     Vitals:    02/23/17 1006   BP: (!) 87/52   Pulse:    Resp:    Temp:    SpO2:        Intake and Output Summary (Last 24 hours) at Date Time    Intake/Output Summary (Last 24 hours) at 02/23/17 1044  Last data filed at 02/23/17 0347   Gross per 24 hour   Intake              120 ml   Output             1400 ml   Net            -1280 ml       General appearance - alert, well appearing, and in no distress  Mouth - mucous membranes moist, pharynx normal without lesions  Neck - supple, no significant adenopathy  Lymphatics - no palpable lymphadenopathy, no hepatosplenomegaly  Chest - clear to auscultation, no wheezes, rales or rhonchi, symmetric air entry  Heart - S1 and S2 normal, no murmurs noted, no gallops noted, no JVD, tachycardic   Abdomen - soft, nontender, nondistended, no masses or organomegaly  Neurological - alert, oriented, normal speech, no focal findings or movement disorder noted  Musculoskeletal - no joint tenderness, deformity or swelling  Extremities - peripheral pulses normal, no pedal edema, no clubbing or cyanosis  Skin - normal coloration and turgor, no rashes, no suspicious skin lesions  noted         Labs:     Results     Procedure Component Value Units Date/Time    Comprehensive metabolic panel [[id number]]  (Abnormal) Collected:  02/23/17 0314    Specimen:  Blood Updated:  02/23/17 0351     Glucose 101 (H) mg/dL      BUN 40.9 (H) mg/dL      Creatinine 1.1 mg/dL      Sodium 811 (L) mEq/L      Potassium 4.6 mEq/L      Chloride 107 mEq/L      CO2 17 (L) mEq/L      Calcium 8.7 mg/dL      Protein, Total 5.8 (L) g/dL      Albumin 2.7 (L) g/dL      AST (SGOT) 21 U/L      ALT 13 U/L      Alkaline Phosphatase 82 U/L      Bilirubin, Total 0.3 mg/dL      Globulin 3.1 g/dL      Albumin/Globulin Ratio 0.9    GFR [[id number]] Collected:  02/23/17 0314     Updated:  02/23/17 0351     EGFR >60.0    CBC and differential [[id number]]  (Abnormal) Collected:  02/23/17 0314    Specimen:  Blood from Blood Updated:  02/23/17 0346     WBC 8.85 x10 3/uL      Hgb 8.2 (L) g/dL      Hematocrit 46.9 (L) %      Platelets 383 x10 3/uL      RBC 3.34 (L) x10 6/uL      MCV 85.3 fL      MCH 24.6 (L) pg      MCHC 28.8 (L) g/dL      RDW 15 %  MPV 10.6 fL      Neutrophils 80.5 %      Lymphocytes Automated 5.2 %      Monocytes 11.4 %      Eosinophils Automated 1.7 %      Basophils Automated 0.7 %      Immature Granulocyte 0.5 %      Nucleated RBC 0.0 /100 WBC      Neutrophils Absolute 7.13 x10 3/uL      Abs Lymph Automated 0.46 (L) x10 3/uL      Abs Mono Automated 1.01 x10 3/uL      Abs Eos Automated 0.15 x10 3/uL      Absolute Baso Automated 0.06 x10 3/uL      Absolute Immature Granulocyte 0.04 x10 3/uL      Absolute NRBC 0.00 x10 3/uL     Kappa/Lambda Light Chain, Total [[id number]] Collected:  02/21/17 1422     Updated:  02/22/17 2147     Kappa, Quantitative 243 mg/dL      Lambda, Quantitative 134 mg/dL      Kappa/Lambda Ratio 1.81    IFE Review, Serum [[id number]] Collected:  02/21/17 1422     Updated:  02/22/17 1555     IFE Serum Review See Note    Immunofixation electrophoresis, Serum [[id number]] Collected:  02/21/17 1422     Specimen:  Blood Updated:  02/22/17 1554     IFE Serum Interp See IFE Review    Serum Protein Electrophoresis Review [[id number]] Collected:  02/21/17 1422     Updated:  02/22/17 1449     Serum Prot Electrophoresis Revwew See Note    Protein electrophoresis, serum [[id number]]  (Abnormal) Collected:  02/21/17 1422    Specimen:  Blood Updated:  02/22/17 1449     Protein, Total 5.5 (L) g/dL      Albumin % 38.7 (L) %      Albumin, Synovial 2.5 (L) g/dL      Alpha-1 Glob % 5.8 (H) %      Alpha-1 Globulin 0.3 g/dL      Alpha-2 Glob % 56.4 (H) %      Alpha-2 Globulin 1.0 g/dL      Beta Glob % 33.2 %      Beta Globulin 0.9 g/dL      Gamma Globulin % 95.1 %      Gamma Globulin 0.8 g/dL               Rads:   Radiological Procedure reviewed.    Signed by: Schuyler Amor MD  [phone]

## 2017-02-23 NOTE — Progress Notes (Addendum)
[facility]   Barbi Kumagai M. Lucianne Muss, MD, [facility]  PROGRESS NOTE  Office: [phone]  Cell phone [phone]                                       Date Time: 02/23/17 7:52 AM  Patient Name: [name],[name]      Assessment:    Two-vessel obstructive CAD s/p PCI/DES to LM/LCx on 02/22/17.    Ventricular tachycardia, non-sustained, recorded on tele on 02/10/17 at 12:07- asymptomatic and hemodynamically stable.    Atrial fibrillation, new onset, with rapid ventricular response. Converted to NSR spontaneously. Currently in SR. Patient with high risk of rebleed with use of anticoagulation given multiple AVMs (stomach, duodenum and cecum) noted on endoscopic procedures.   CHF with reduced EF of 36%   Severe pulmonary hypertension with an estimated PASP of 63 mmHg by echo   GI bleeding, no acute bleed.   Anemia, H/H stable.    CKD stage III- Cr stable.    Dyslipidemia   Plan:    ASA 81 mg daily   Lipitor 80 mg qhs   Coreg 3.125 mg bid   Lisinopril 10 mg bid- this would be the max dose of ACEI for him.    Viability study as outpatient to guide possible future PCI to LAD   Cardiac rehab as outpatient   TEE for LAA measurements for planning for Watchman device as outpatient.    He is stable from a cardiac standpoint  Recent Cardiac Work up:   Echo 02/11/17:   The left ventricle is normal in size.    * Left ventricular systolic function is moderately decreased with an  ejection fraction by Biplane Method of Discs of  36 %.    * Left Ventricular diastolic filling parameters are consistent with Grade II  diastolic dysfunction (pseudonormal pattern).    * Right ventricular size and systolic function are normal.    * Mild bi-atrial enlargement.    * Moderate to severe mitral valve regurgitation.    * There is mild tricuspid regurgitation.    * Moderate pulmonary hypertension with estimated right ventricular systolic  pressure of  63.29 mmHg.  EKG 02/09/17:   AF with RVR at 130 bpm  NS STT abnormalities  BNP  02/10/17:  2921  Cardiac cath 02/18/17:   Right Heart Catheterization:     Right atrial pressure: 8 mmHg   Right ventricular pressure: 41/10 mmHg   Pulmonary artery pressure:  41/15/22 mmHg   Pulmonary capillary wedge pressure:  14 mmHg   Cardiac output/ cardiac index by Fick 4.1/2.5  Left Heart Catheterization:     Aortic pressure:  105/48/66 mmHg.    LV pressure 105/35 mmHg.    Left ventricular end diastolic pressure: 35 mmHg.   There was no significant gradient across AV   Left Ventriculography:    Severely reduced LV systolic function; LVEF: 20%   Severe global hypokinesis   1+ mitral regurgitation  Coronary Angiography:     LM: 100% occluded distally.    LAD: 100% occluded at ostium.     LCx: Non-dominant vessel and 100% occluded at ostium.   RCA: Large dominant vessel giving rise to RPDA and RPLB and feeding the left coronary arteries via collaterals. RCA has mild diffuse disease w/o focal stenosis. RPLB is a large branching vessel w/o any significant stenosis. RPDA is a medium size vessel  and is subtotally occluded in mid segment.   Subjective:   He has dyspnea with minimal exertion. Denies chest pain.  Right radial cath site looks good w/o any complication. Radial pulse is 2+ palpable.   Medications:     Current Facility-Administered Medications   Medication Dose Route Frequency   . aspirin  81 mg Oral Daily   . atorvastatin  80 mg Oral QHS   . carvedilol  3.125 mg Oral Q12H SCH   . clopidogrel  75 mg Oral Daily   . cyanocobalamin  1,000 mcg Intramuscular Daily   . iron sucrose  200 mg Intravenous Q24H SCH   . lisinopril  10 mg Oral Q12H SCH   . pantoprazole  40 mg Oral BID         Physical Exam:     Vitals:    02/23/17 0300   BP: 109/55   Pulse: (!) 102   Resp: 22   Temp: 97.7 F (36.5 C)   SpO2: 97%       Intake and Output Summary (Last 24 hours) at Date Time    Intake/Output Summary (Last 24 hours) at 02/23/17 0752  Last data filed at 02/23/17 0347   Gross per 24 hour   Intake               120 ml   Output             1400 ml   Net            -1280 ml       General appearance - in no distress   Head: NC/AT, mm moist  Neck - supple, Carotids 2+, no bruit, no JVD   Chest - clear to auscultation, no wheezes, rales or rhonchi, symmetric air entry   Heart - normal rate and regular rhythm, S1 and S2 normal, no murmurs   Abdomen - soft, nontender, nondistended, no masses or organomegaly   Extremities - peripheral pulses normal, no pedal edema, no clubbing or cyanosis. Right radial cath site looks good w/o any complication. Radial pulse is 2+ palpable.         Labs:   Recent CBC   Recent Labs      02/23/17   0314   RBC  3.34*   Hgb  8.2*   Hematocrit  28.5*   MCV  85.3   MCH  24.6*   MCHC  28.8*   RDW  15   MPV  10.6   Platelets  383     Recent BMP   Recent Labs      02/23/17   0314   Glucose  101*   BUN  34.0*   Creatinine  1.1   Calcium  8.7   Sodium  135*   Potassium  4.6   Chloride  107   CO2  17*     Recent CMP   Recent Labs      02/23/17   0314   Glucose  101*   BUN  34.0*   Creatinine  1.1   Sodium  135*   CO2  17*   Calcium  8.7   Albumin  2.7*   AST (SGOT)  21   ALT  13   Globulin  3.1     Recent CARDIAC ENZYMES No results for input(s): TROPONIN, ISTATTROPONI, CK, CKMB in the last 24 hours.    Invalid input(s): TROPONINT  Recent TSH No results for input(s): TSH in the last 24 hours.  Recent URIC ACID Invalid input(s): URIC  Recent BLOOD CULTURE No results for input(s): CXBLD in the last 24 hours.  Recent URINE CULTURE Invalid input(s): CXURN  Recent OCCULT BLOOD Invalid input(s): OCCBL  Recent PT/PTT No results for input(s): INR, PTT in the last 24 hours.    Invalid input(s): PTI, COUM, COUMP, ACOAG, ACOAP  Recent PT/INR No results for input(s): INR in the last 24 hours.    Invalid input(s): PTI, COUM, COUMP  Recent LIPID PANEL No results for input(s): CHOL, TRIG, HDL in the last 24 hours.    Invalid input(s): LDLC, VLDLC, LRAT  Recent ABG No results for input(s): TEMP, FIO2, RATE, MODE, ETCO2, PEEP  in the last 24 hours.    Invalid input(s): APH, APCO2, APO2, AHCO3, ATCO2, ABE, AOSAT, ABGS, ALLEN, STATS, O2DEL, O2FLO, PRESS, VNTMN, PRSUP, TIVOL      Rads:   Radiological Procedure reviewed.    Signed by: Valerie Roys, MD, [facility]

## 2017-02-23 NOTE — UM Notes (Signed)
Continued Stay Review for 02/23/17:    S/p CTO intervention of the LCX with DES 12/04  Per cardiology note from 12/04: ASA 81 mg daily  Lipitor 80 mg qhs  Coreg 3.125 mg bid  Lisinopril 10 mg bid- this would be the max dose of ACEI for him.   Viability study as outpatient to guide possible future PCI to LAD  Cardiac rehab as outpatient  TEE for LAA measurements for planning for Watchman device as outpatient  97.8, 87, 101/55, 74  O2 sat 99%  Na 134, CO2 17, h=h 8.2/28.5, glucose 101  Plan: awaiting MD visit        Bobette Mo. Karel Jarvis, RN BSN  Utilization Review  [employer]  Tel: [phone]

## 2017-02-23 NOTE — Discharge Summary -  Nursing (Signed)
Pt discharged home with family. Discharge instructions discussed in length with patient. All questions answered. Pt ambulated and voided without difficulty with standby guard assist . Right wrist and right groin dressing remains CDI, soft, no hematoma or oozing. Pt escorted to car via wheelchair for safe discharge with family.

## 2017-02-23 NOTE — Plan of Care (Signed)
Problem: Hemodynamic Status: Cardiac  Goal: Stable vital signs and fluid balance  Outcome: Progressing   02/23/17 0911   Goal/Interventions addressed this shift   Stable vital signs and fluid balance Monitor/assess vital signs and telemetry per unit protocol;Assess signs and symptoms associated with cardiac rhythm changes;Monitor lab values;Monitor for leg swelling/edema and report to LIP if abnormal       Comments: Patient vital signs stable. No complain of short of breath or chest pain. Dressing on right wrist and right groin CDI, no sign of hematoma or oozing. Will continue to monitor Pt care and maintain safety.

## 2017-02-23 NOTE — Plan of Care (Signed)
Problem: Hemodynamic Status: Cardiac  Goal: Stable vital signs and fluid balance  Outcome: Progressing  Patient ambulated unit x1, with SBA.  Became SOB with activity, 97% on RA.  Right radial site bruised but soft and non-tender, splint in place.  R groin soft and non-tender, dressing c/d/i.  Denies any pain.  NSR.

## 2017-03-03 ENCOUNTER — Emergency Department: Payer: Medicare Other

## 2017-03-03 ENCOUNTER — Inpatient Hospital Stay
Admission: EM | Admit: 2017-03-03 | Discharge: 2017-03-07 | DRG: 378 | Disposition: A | Discharge status: Home / Self Care | Payer: Medicare Other | Attending: Specialist | Admitting: Specialist

## 2017-03-03 DIAGNOSIS — Z7982 Long term (current) use of aspirin: Secondary | ICD-10-CM

## 2017-03-03 DIAGNOSIS — Z79899 Other long term (current) drug therapy: Secondary | ICD-10-CM

## 2017-03-03 DIAGNOSIS — D649 Anemia, unspecified: Secondary | ICD-10-CM

## 2017-03-03 DIAGNOSIS — I5020 Unspecified systolic (congestive) heart failure: Secondary | ICD-10-CM | POA: Diagnosis present

## 2017-03-03 DIAGNOSIS — Z87891 Personal history of nicotine dependence: Secondary | ICD-10-CM

## 2017-03-03 DIAGNOSIS — Z7902 Long term (current) use of antithrombotics/antiplatelets: Secondary | ICD-10-CM

## 2017-03-03 DIAGNOSIS — I2510 Atherosclerotic heart disease of native coronary artery without angina pectoris: Secondary | ICD-10-CM | POA: Diagnosis present

## 2017-03-03 DIAGNOSIS — N179 Acute kidney failure, unspecified: Secondary | ICD-10-CM | POA: Diagnosis present

## 2017-03-03 DIAGNOSIS — K5521 Angiodysplasia of colon with hemorrhage: Principal | ICD-10-CM | POA: Diagnosis present

## 2017-03-03 DIAGNOSIS — Z955 Presence of coronary angioplasty implant and graft: Secondary | ICD-10-CM

## 2017-03-03 DIAGNOSIS — D62 Acute posthemorrhagic anemia: Secondary | ICD-10-CM | POA: Diagnosis present

## 2017-03-03 DIAGNOSIS — I110 Hypertensive heart disease with heart failure: Secondary | ICD-10-CM | POA: Diagnosis present

## 2017-03-03 DIAGNOSIS — E785 Hyperlipidemia, unspecified: Secondary | ICD-10-CM | POA: Diagnosis present

## 2017-03-03 DIAGNOSIS — I480 Paroxysmal atrial fibrillation: Secondary | ICD-10-CM | POA: Diagnosis present

## 2017-03-03 DIAGNOSIS — K31811 Angiodysplasia of stomach and duodenum with bleeding: Secondary | ICD-10-CM | POA: Diagnosis present

## 2017-03-03 DIAGNOSIS — I252 Old myocardial infarction: Secondary | ICD-10-CM

## 2017-03-03 DIAGNOSIS — K922 Gastrointestinal hemorrhage, unspecified: Secondary | ICD-10-CM

## 2017-03-03 LAB — ECG 12-LEAD
Atrial Rate: 84 {beats}/min
P Axis: 57 degrees
P-R Interval: 180 ms
Q-T Interval: 392 ms
QRS Duration: 112 ms
QTC Calculation (Bezet): 463 ms
R Axis: -4 degrees
T Axis: 87 degrees
Ventricular Rate: 84 {beats}/min

## 2017-03-03 LAB — I-STAT CG4 VENOUS CARTRIDGE
Lactic Acid I-Stat: 1.4 mmol/L (ref 0.2–2.0)
i-STAT Base Excess Venous: -3 mEq/L
i-STAT FIO2: 21
i-STAT HCO3 Bicarbonate Venous: 21 mEq/L
i-STAT O2 Saturation Venous: 43 %
i-STAT Patient Temperature: 98.2
i-STAT Total CO2 Venous: 22 mEq/L
i-STAT pCO2 Venous: 32.2
i-STAT pH Venous: 7.423
i-STAT pO2 Venous: 23

## 2017-03-03 LAB — CBC AND DIFFERENTIAL
Absolute NRBC: 0.02 10*3/uL — ABNORMAL HIGH
Basophils Absolute Automated: 0.03 10*3/uL (ref 0.00–0.20)
Basophils Automated: 0.3 %
Eosinophils Absolute Automated: 0.08 10*3/uL (ref 0.00–0.70)
Eosinophils Automated: 0.8 %
Hematocrit: 19.7 % — ABNORMAL LOW (ref 42.0–52.0)
Hgb: 5.7 g/dL — CL (ref 13.0–17.0)
Immature Granulocytes Absolute: 0.05 10*3/uL
Immature Granulocytes: 0.5 %
Lymphocytes Absolute Automated: 0.52 10*3/uL (ref 0.50–4.40)
Lymphocytes Automated: 5 %
MCH: 26.1 pg — ABNORMAL LOW (ref 28.0–32.0)
MCHC: 28.9 g/dL — ABNORMAL LOW (ref 32.0–36.0)
MCV: 90.4 fL (ref 80.0–100.0)
MPV: 10.4 fL (ref 9.4–12.3)
Monocytes Absolute Automated: 1.57 10*3/uL — ABNORMAL HIGH (ref 0.00–1.20)
Monocytes: 15.2 %
Neutrophils Absolute: 8.09 10*3/uL (ref 1.80–8.10)
Neutrophils: 78.2 %
Nucleated RBC: 0.2 /100 WBC (ref 0.0–1.0)
Platelets: 652 10*3/uL — ABNORMAL HIGH (ref 140–400)
RBC: 2.18 10*6/uL — ABNORMAL LOW (ref 4.70–6.00)
RDW: 21 % — ABNORMAL HIGH (ref 12–15)
WBC: 10.34 10*3/uL (ref 3.50–10.80)

## 2017-03-03 LAB — CROSSMATCH PRBCS, 2 UNITS
Expiration Date: 201812282359
Expiration Date: 201812282359
Status: TRANSFUSED
Status: TRANSFUSED
UTYPE: O POS
UTYPE: O POS

## 2017-03-03 LAB — PT AND APTT
PT INR: 1.2 — ABNORMAL HIGH (ref 0.9–1.1)
PT: 15.2 s — ABNORMAL HIGH (ref 12.6–15.0)
PTT: 29 s (ref 23–37)

## 2017-03-03 LAB — COMPREHENSIVE METABOLIC PANEL
ALT: 14 U/L (ref 0–55)
AST (SGOT): 12 U/L (ref 5–34)
Albumin/Globulin Ratio: 1 (ref 0.9–2.2)
Albumin: 3 g/dL — ABNORMAL LOW (ref 3.5–5.0)
Alkaline Phosphatase: 84 U/L (ref 38–106)
BUN: 49 mg/dL — ABNORMAL HIGH (ref 9.0–28.0)
Bilirubin, Total: 0.4 mg/dL (ref 0.2–1.2)
CO2: 17 mEq/L — ABNORMAL LOW (ref 22–29)
Calcium: 8.6 mg/dL (ref 7.9–10.2)
Chloride: 105 mEq/L (ref 100–111)
Creatinine: 2.2 mg/dL — ABNORMAL HIGH (ref 0.7–1.3)
Globulin: 3.1 g/dL (ref 2.0–3.6)
Glucose: 109 mg/dL — ABNORMAL HIGH (ref 70–100)
Potassium: 4.2 mEq/L (ref 3.5–5.1)
Protein, Total: 6.1 g/dL (ref 6.0–8.3)
Sodium: 137 mEq/L (ref 136–145)

## 2017-03-03 LAB — TYPE AND SCREEN
AB Screen Gel: NEGATIVE
ABO Rh: O POS

## 2017-03-03 LAB — GFR: EGFR: 28.4

## 2017-03-03 LAB — TROPONIN I: Troponin I: 0.03 ng/mL (ref 0.00–0.09)

## 2017-03-03 MED ORDER — IRON SUCROSE 20 MG/ML IV SOLN
200.00 mg | INTRAVENOUS | Status: DC
Start: 2017-03-03 — End: 2017-03-07
  Administered 2017-03-03 – 2017-03-06 (×4): 200 mg via INTRAVENOUS
  Filled 2017-03-03 (×5): qty 10

## 2017-03-03 MED ORDER — SODIUM CHLORIDE 0.9 % IV BOLUS
500.00 mL | Freq: Once | INTRAVENOUS | Status: AC
Start: 2017-03-03 — End: 2017-03-03
  Administered 2017-03-03: 15:00:00 500 mL via INTRAVENOUS

## 2017-03-03 MED ORDER — ATORVASTATIN CALCIUM 80 MG PO TABS
80.00 mg | ORAL_TABLET | Freq: Every evening | ORAL | Status: DC
Start: 2017-03-03 — End: 2017-03-07
  Administered 2017-03-03 – 2017-03-06 (×4): 80 mg via ORAL
  Filled 2017-03-03 (×4): qty 1

## 2017-03-03 MED ORDER — CYANOCOBALAMIN 1000 MCG/ML IJ SOLN
1000.00 ug | INTRAMUSCULAR | Status: DC
Start: 2017-03-03 — End: 2017-03-07
  Administered 2017-03-03 – 2017-03-06 (×4): 1000 ug via INTRAMUSCULAR
  Filled 2017-03-03 (×5): qty 1

## 2017-03-03 MED ORDER — PANTOPRAZOLE SODIUM 40 MG IV SOLR
40.00 mg | Freq: Two times a day (BID) | INTRAVENOUS | Status: DC
Start: 2017-03-03 — End: 2017-03-07
  Administered 2017-03-03 – 2017-03-07 (×8): 40 mg via INTRAVENOUS
  Filled 2017-03-03 (×8): qty 40

## 2017-03-03 MED ORDER — SODIUM CHLORIDE 0.9 % IV BOLUS
250.00 mL | Freq: Once | INTRAVENOUS | Status: AC
Start: 2017-03-03 — End: 2017-03-03
  Administered 2017-03-03: 13:00:00 250 mL via INTRAVENOUS

## 2017-03-03 MED ORDER — PANTOPRAZOLE SODIUM 40 MG IV SOLR
80.00 mg | Freq: Once | INTRAVENOUS | Status: AC
Start: 2017-03-03 — End: 2017-03-03
  Administered 2017-03-03: 15:00:00 80 mg via INTRAVENOUS
  Filled 2017-03-03: qty 80

## 2017-03-03 MED ORDER — ACETAMINOPHEN 325 MG PO TABS
650.0000 mg | ORAL_TABLET | ORAL | Status: DC | PRN
Start: 2017-03-03 — End: 2017-03-07

## 2017-03-03 NOTE — Consults (Signed)
[facility]  CONSULTATION NOTE  [facility] call: [phone], [phone]  [facility] call: [phone]  After hours call [phone]        Date Time: 03/03/17 6:59 PM  Patient Name: [name]  Requesting Physician: Susie Cassette, MD       Reason for Consultation:   Hematochezia  Acute on chronic, blood loss anemia  Gastrointestinal angioectasias    Assessment and Plan:   Assessment:  1. Maroon stool, reported by ED staff. Pt notes "thick and pasty" stool for past several days but does not notice color. Prior symptomatic anemia workup 11/23 by EGD and colonoscopy - demonstrating multiple AVMs in stomach and duodenum, few large patchy angioectasias with bleeding in cecum. Current bleeding likely from AVMs, exacerbated in setting of ASA and Plavix.  2. Acute on chronic, blood loss anemia, likely contributed from above  3. CAD s/p DES (12/5) on Plavix and ASA, last dose 12/13 AM  4. A fib    Plan:  1. Continue to trend H/H, transfuse prn.  2. Continue Protonix 40 mg BID.  3. Endoscopic intervention is limited in the setting of dual anti-platelet therapy. Ideally would need to hold Plavix for 5 days before any intervention can be performed. Given recent drug eluting stent placement on 12/4, suspect cardiology would feel holding these agents is too high risk.   4. Continue supportive care. If he becomes hemodynamically unstable, may need to proceed with high risk urgent endoscopic evaluation. If cardiology is ok with holding Plavix, may consider endoscopic evaluation next week.    History:   [name] is an 83 y.o. male with a PMH of anemia, prior GI bleeds, A fib, CAD with recent DES placement (12/4) on ASA and Plavix who presents to the hospital on 03/03/2017 with maroon stool and anemia. Maroon stool reported by ED staff. Patient reports that he has not looked at his stool color but that it is "thick and pasty." He has a BM once every 1-2 days on average. No abdominal pain. Pt was recently  discharged from hospital on 12/5 after recent admission for hypotension and anemia. At that time GI evaluation revealed AVMs in the stomach, duodenum, and agioectasias in the colon. Argon beam coagulation was applied to the AVMs, and pt was discharged on PPI BID. He also had A fib during last admission, and severe CAD. He had PCI with DES on 12/5 and has remained on ASA and Plavix.    Past Medical History:     Past Medical History:   Diagnosis Date   . Anemia     iron low   . DOE (dyspnea on exertion)     mild   . Glaucoma NEC    . Hypertensive disorder    . Lupus 1990   . Skin cancer     2006 removed mole   . Ulcerative colitis, chronic    . Vision abnormalities        Past Surgical History:     Past Surgical History:   Procedure Laterality Date   . CARDIAC SURGERY      3 stents   . EGD, COLONOSCOPY  04/03/2012    Procedure: EGD, COLONOSCOPY;  Surgeon: Lestine Mount, MD;  Location: [id number];  Service: Gastroenterology;  Laterality: N/A;   . EGD, COLONOSCOPY N/A 02/11/2017    Procedure: EGD, COLONOSCOPY;  Surgeon: Lestine Mount, MD;  Location: [facility];  Service: Gastroenterology;  Laterality: N/A;   . EYE SURGERY  cataract       Family History:   No family history on file.    Social History:     Social History     Social History   . Marital status: Married     Spouse name: N/A   . Number of children: N/A   . Years of education: N/A     Social History Main Topics   . Smoking status: Former Smoker     Packs/day: 1.00     Years: 12.00     Types: Cigarettes     Quit date: 12/18/1961   . Smokeless tobacco: Never Used   . Alcohol use 0.0 oz/week      Comment: 0.5oz/d   . Drug use: No   . Sexual activity: Not on file     Other Topics Concern   . Not on file     Social History Narrative   . No narrative on file       Allergies:   No Known Allergies    Medications:     Current Facility-Administered Medications   Medication Dose Route Frequency   . cyanocobalamin  1,000 mcg Intramuscular Daily   . iron sucrose   200 mg Intravenous Q24H SCH       Review of Systems:   General:  Patient denies lack of appetite, night sweats, weight loss, fatigue, fever.   HEENT:  Patient denies headache, hoarseness   Cardiovascular:  Patient denies swelling of hands/feet, fainting/blacking out, chest pain.   Respiratory:  Patient denies chronic cough, difficulty breathing, wheezing.   Genitourinary:  Patient denies blood in urine, dark urine  Musculoskeletal: Patient denies joint pain, joint stiffness, joint swelling.   Skin:  Patient denies itching, rash.   Neurologic:  Patient denies dizziness, loss of consciousness, fainting, confusion  Heme/Lymphatic:  Patient denies easy bruising.       Pertinent positives noted in HPI.    Physical Exam:     Vitals:    03/03/17 1830   BP: 99/55   Pulse: 90   Resp:    Temp:    SpO2: 100%       General appearance: Elderly, well nourished, appears stated age and in NAD  Eyes: Sclera anicteric, pink conjunctivae, no ptosis  ENMT: mucous membranes moist, nose and ears appear normal.  Oropharynx clear.  Chest: Non labored respirations, no audible wheezing, no clubbing or cyanosis  CV:  Regular rate and rhythm, no JVD, no LE edema  Abdomen: soft, non-tender, non-distended, no masses or organomegaly  Skin: Normal color and turgor, no rashes, no suspicious skin lesions noted  Neuro: CN II-XII grossly intact.  No gross movement disorders noted.  Mental status: Appropriate affect, alert and oriented x 3    Labs Reviewed:     Recent Labs      03/03/17   1301   WBC  10.34   Hgb  5.7*   Hematocrit  19.7*   Platelets  652*   MCV  90.4       Recent Labs      03/03/17   1301   Sodium  137   Potassium  4.2   Chloride  105   CO2  17*   BUN  49.0*   Creatinine  2.2*   Glucose  109*   Calcium  8.6       Recent Labs      03/03/17   1301   AST (SGOT)  12   ALT  14  Alkaline Phosphatase  84   Bilirubin, Total  0.4   Protein, Total  6.1   Albumin  3.0*       Recent Labs      03/03/17   1301   PTT  29   PT  15.2*   PT INR  1.2*         Radiology:   Radiological Procedure reviewed:      Endoscopy:     Colon 02/11/17: hemorrhoids, few large patchy angioectasias with bleeding in cecum (+coag with argon plasma)    EGD 02/11/17: few non-bleeding angioectasias in stomach (+argon beam coag), few angioectasias in duodenum and D2 (+ABC)    Colonoscopy 04/03/12: Angioectasia/AVM found in the ascending colon. Argon beam coagulation was applied to control bleeding. Hemorrhoids found.    EGD 04/03/12: Areas of angioectasia/AVM in the body of the stomach. Argon beam coagulation was applied to control bleeding. An ulcer was found in the prepyloric region. Areas of angioectasia/AVM were found in the duodenal bulb and 2nd portion of the duodenum.    Colonoscopy 07/21/11: Angioectasia/AVM found in the cecum, ascending colon and distal transverse colon. Argon beam coagulation used to control bleeding. Severe diverticulosis in the sigmoid colon. Internal grade II hemorrhoids.     EGD 07/21/11: Multiple AVMs in the hiatal hernia treated with APC cautery. Areas of angioectasia/AVM were found in the body of the stomach and cardia. Argon beam coagulation was applied to control bleeding. Areas of angioectasia/AVM were found in the 2nd portion of the duodenum and 3rd portion of the duodenum. Argon beam coagulation was applied to control bleeding.

## 2017-03-03 NOTE — ED Provider Notes (Signed)
[facility] EMERGENCY DEPARTMENT H&P      Visit date: 03/03/2017      CLINICAL SUMMARY          Diagnosis:    .     Final diagnoses:   Gastrointestinal hemorrhage, unspecified gastrointestinal hemorrhage type   Symptomatic anemia         MDM Notes:    Pt Presented with abnormal labs, specifically low H and H in a setting of recurrent gastrointestinal bleed.  Patient complains of feeling weak and fatigued.  Noted to have maroon bloody stools on rectal exam.  Soft, blood pressure is in ED, however, baseline BPs to run low and patient did respond to fluids.  Evaluated by [facility], cleared for admission to floor.  Plan to admit to Dr. York Grice service for further evaluation and management.  Gastrointestinal consulted         Disposition:         Inpatient Admit      ED Disposition     ED Disposition Condition Date/Time Comment    Admit  Thu Mar 03, 2017  4:07 PM Admitting Physician: Schuyler Amor [[id number]]   Diagnosis: GI bleed [660630]   Estimated Length of Stay: > or = to 2 midnights   Tentative Discharge Plan?: Home or Self Care [1]   Patient Class: Inpatient [101]           CASE ACUITY SUMMARY        GI bleed, anemia             CLINICAL INFORMATION        HPI:      Chief Complaint: Abnormal Lab  .    [name] is a 83 y.o. male with pmhx including HTN, UC, anemia, lupus and s/p 3 stents who presents sent into the ED d/t low hgb of 5.8 found on routine outpatient labs with GI. C/o worsening weakness. No vomiting, abdominal pain, trouble breathing or CP.   Of note, he was admitted to the [facility] on 02/09/17 for GI bleed and hypotension.    Location: NA  Quality: weak  Severity: moderate  Duration: NA  Modifying Factors: NA  Associated symptoms: NA    Symptoms worsened in the last 24-48 hrs:  No  Patient sent from another medical provider: Yes    History obtained from: patient, family      ROS:      Positive and negative ROS elements as per HPI.  All other systems reviewed and negative.       Physical Exam:      Pulse 96  BP 98/46  Resp 19  SpO2 100 %  Temp 98.5 F (36.9 C)    Physical Exam   Constitutional: He is oriented to person, place, and time. He appears well-developed and well-nourished. No distress.   pale   HENT:   Head: Normocephalic and atraumatic.   Eyes: EOM are normal.   Neck: Neck supple.   Cardiovascular: Normal rate, regular rhythm and normal heart sounds.    Pulmonary/Chest: Effort normal and breath sounds normal. No respiratory distress.   Abdominal: Soft. He exhibits no distension.   Genitourinary:   Genitourinary Comments: Rectal: maroon stool    Musculoskeletal: Normal range of motion.   Neurological: He is alert and oriented to person, place, and time.   Skin: Skin is warm and dry. Ecchymosis (scattered ecchymosis anterior chest wall) noted.   Psychiatric: He has a normal mood and affect.   Nursing note  and vitals reviewed.             PAST HISTORY        Primary Care Provider: Philipp Ovens, MD        PMH/PSH:    .     Past Medical History:   Diagnosis Date   . Anemia     iron low   . DOE (dyspnea on exertion)     mild   . Glaucoma NEC    . Hypertensive disorder    . Lupus 1990   . Skin cancer     2006 removed mole   . Ulcerative colitis, chronic    . Vision abnormalities        He has a past surgical history that includes Eye surgery; EGD, COLONOSCOPY (04/03/2012); EGD, COLONOSCOPY (N/A, 02/11/2017); and Cardiac surgery.      Social/Family History:      He reports that he quit smoking about 55 years ago. His smoking use included Cigarettes. He has a 12.00 pack-year smoking history. He has never used smokeless tobacco. He reports that he drinks alcohol. He reports that he does not use drugs.    No family history on file.      Listed Medications on Arrival:    .     Home Medications     Med List Status:  In Progress Set By: Zack Seal, RN at 03/03/2017  1:06 PM                aspirin 81 MG chewable tablet     Chew 1 tablet (81 mg total) by mouth daily.     atorvastatin  (LIPITOR) 80 MG tablet     Take 1 tablet (80 mg total) by mouth nightly.     carvedilol (COREG) 3.125 MG tablet     Take 1 tablet (3.125 mg total) by mouth every 12 (twelve) hours.     clopidogrel (PLAVIX) 75 mg tablet     Take 1 tablet (75 mg total) by mouth daily.     FUROSEMIDE PO     Take by mouth.     IRON PO     Take 200 mg by mouth daily.     lisinopril (PRINIVIL,ZESTRIL) 10 MG tablet     Take 1 tablet (10 mg total) by mouth every 12 (twelve) hours.     pantoprazole (PROTONIX) 40 MG tablet     Take 1 tablet (40 mg total) by mouth 2 (two) times daily.         Allergies: He has No Known Allergies.            VISIT INFORMATION        Interpretations:       Differential Diagnosis Considered (not completely inclusive):  Initial differential diagnosis included: GI bleed, symptomatic anemia    Pulse Oximetry Analysis:  saturation: 100 %; Oxygen use: room air; Interpretation: Normal      Laboratory results reviewed and Interpreted by me:  Yes    EKG reviewed: interpreted by me: NSR at 84, mild intraventricular conduction delay, normal axis, non specific ST changes    Cardiac Monitor Analysis:  interpreted by me: normal sinus at 70s.    Critical Care Time(not including procedures): 35 minutes.   Due to the high risk of critical illness or multi-organ failure at initial presentation and/or during ED course.    System(s) at risk for compromise:  circulatory and hematologic  Critical Diagnosis:   1. Gastrointestinal hemorrhage, unspecified gastrointestinal  hemorrhage type    2. Symptomatic anemia         The patient was Hypotensive:   Yes   The patient was Hypoxic:   No     This does not including time spent performing other reported procedures or services.   Critical care time involved full attention to the patient's condition and included:   Review of nursing notes and/or old charts - Yes  Documentation time - Yes  Care, transfer of care, and discharge plans - Yes  Obtaining necessary history from family, EMS,  nursing home staff and/or treating physicians - Yes  Review of medications, allergies, and vital signs - Yes   Consultant collaboration on findings and treatment options - Yes  Ordering, interpreting, and reviewing diagnostic studies/tab tests - Yes           Clinical Course:         ED Course as of Mar 03 1606   Thu Mar 03, 2017   1436 Dr York Grice paged for admission  [OH]   1457 Per nurse, pt's BP dropped.   [OH]   1459 Called Ishaq back and he is requesting ICU.  [OH]   1509 D/w [facility]. Will eval.  [OH]   1536 Lindsey from GI aware.  [OH]   1545 Per [facility]. Per son baseline bp high 80 to 90s. Did orthostatics. Not orthostatic. Pt feels well. Does not think he needs ICU.  [OH]   1607 Per York Grice to go to med tele inpt  [OH]      ED Course User Index  [OH] Herder, Zollie Scale       Procedures:      Procedures      Medications Given in the ED:      ED Medication Orders     Start Ordered     Status Ordering Provider    03/03/17 1501 03/03/17 1500  sodium chloride 0.9 % bolus 500 mL  Once     Route: Intravenous  Ordered Dose: 500 mL     Last MAR action:  New Bag [name] M    03/03/17 1456 03/03/17 1455  pantoprazole (PROTONIX) injection 80 mg  Once     Route: Intravenous  Ordered Dose: 80 mg     Last MAR action:  Given [name] M    03/03/17 1236 03/03/17 1235  sodium chloride 0.9 % bolus 250 mL  Once     Route: Intravenous  Ordered Dose: 250 mL     Last MAR action:  Stopped [name]                  RESULTS        Lab Results:      Results     Procedure Component Value Units Date/Time    i-Stat CG4 Venous CartrIDge [[id number]] Collected:  03/03/17 1526     Updated:  03/03/17 1535     i-STAT pH Venous 7.423     i-STAT pCO2 Venous 32.2     i-STAT pO2 Venous 23.0     i-STAT HCO3 Bicarbonate Venous 21.0 mEq/L      i-STAT Total CO2 Venous 22.0 mEq/L      i-STAT Base Excess Venous -3.0 mEq/L      i-STAT O2 Saturation Venous 43.0 %      i-STAT Lactic acid 1.4 mmol/L      i-STAT Patient Temperature 98.2 F     i-STAT  FIO2 21     i-STAT O2  Delivery Room Air     i-STAT Allen's Test NA     i-STAT ECMO No     i-STAT Draw Site Venous    Prepare/Crossmatch Red Blood Cells:  Two Units [[id number]] Collected:  03/03/17 1301     Updated:  03/03/17 1522     RBC Leukoreduced RBC Leukoreduced     BLUNIT [id number]     Status selected     PRODUCT CODE (NON READABLE) E0226V00     Expiration Date 562130865784     UTYPE O POS     RBC Leukoreduced RBC Leukoreduced     BLUNIT [id number]     Status selected     PRODUCT CODE (NON READABLE) E0226V00     Expiration Date 401027253664     UTYPE O POS    Type and Screen [[id number]] Collected:  03/03/17 1301    Specimen:  Blood Updated:  03/03/17 1412     ABO Rh O POS     AB Screen Gel NEG    CBC with differential [[id number]]  (Abnormal) Collected:  03/03/17 1301    Specimen:  Blood from Blood Updated:  03/03/17 1400     WBC 10.34 x10 3/uL      Hgb 5.7 (LL) g/dL      Hematocrit 32.9 (L) %      Platelets 652 (H) x10 3/uL      RBC 2.18 (L) x10 6/uL      MCV 90.4 fL      MCH 26.1 (L) pg      MCHC 28.9 (L) g/dL      RDW 21 (H) %      MPV 10.4 fL      Neutrophils 78.2 %      Lymphocytes Automated 5.0 %      Monocytes 15.2 %      Eosinophils Automated 0.8 %      Basophils Automated 0.3 %      Immature Granulocyte 0.5 %      Nucleated RBC 0.2 /100 WBC      Neutrophils Absolute 8.09 x10 3/uL      Abs Lymph Automated 0.52 x10 3/uL      Abs Mono Automated 1.57 (H) x10 3/uL      Abs Eos Automated 0.08 x10 3/uL      Absolute Baso Automated 0.03 x10 3/uL      Absolute Immature Granulocyte 0.05 x10 3/uL      Absolute NRBC 0.02 (H) x10 3/uL     GFR [[id number]] Collected:  03/03/17 1301     Updated:  03/03/17 1340     EGFR 28.4    Comprehensive metabolic panel [[id number]]  (Abnormal) Collected:  03/03/17 1301    Specimen:  Blood Updated:  03/03/17 1340     Glucose 109 (H) mg/dL      BUN 32.3 (H) mg/dL      Creatinine 2.2 (H) mg/dL      Sodium 557 mEq/L      Potassium 4.2 mEq/L      Chloride 105 mEq/L      CO2 17  (L) mEq/L      Calcium 8.6 mg/dL      Protein, Total 6.1 g/dL      Albumin 3.0 (L) g/dL      AST (SGOT) 12 U/L      ALT 14 U/L      Alkaline Phosphatase 84 U/L      Bilirubin, Total 0.4 mg/dL      Globulin 3.1  g/dL      Albumin/Globulin Ratio 1.0    PT/APTT [[id number]]  (Abnormal) Collected:  03/03/17 1301     Updated:  03/03/17 1339     PT 15.2 (H) sec      PT INR 1.2 (H)     PT Anticoag. Given Within 48 hrs. None     PTT 29 sec               Radiology Results:      XR Chest  AP Portable   Final Result    Interstitial prominence with bibasilar airspace disease and   a probable small left pleural effusion. The findings could be related to   edema in the appropriate clinical setting however infection cannot be   excluded.      Marius Ditch, MD    03/03/2017 1:06 PM                  Scribe Attestation:      I was acting as a Neurosurgeon for Ethan Cassette, MD on [name],[name]  Treatment Team: Scribe: Despina Hidden     I am the first provider for this patient and I personally performed the services documented. Treatment Team: Scribe: Despina Hidden is scribing for me on [name],[name]. This note and the patient instructions accurately reflect work and decisions made by me.  Ethan Cassette, MD    *This note was generated by the Epic EMR system/ Dragon speech recognition and may contain inherent errors or omissions not intended by the user. Grammatical errors, random word insertions, deletions, pronoun errors and incomplete sentences are occasional consequences of this technology due to software limitations. Not all errors are caught or corrected. If there are questions or concerns about the content of this note or information contained within the body of this dictation they should be addressed directly with the author for clarification.Ethan Cassette, MD  03/10/17 [phone]

## 2017-03-03 NOTE — H&P (Signed)
ADMISSION HISTORY AND PHYSICAL EXAM    Date Time: 03/03/17 5:50 PM  Patient Name: [name],[name]  Attending Physician: Susie Cassette, MD    Assessment:   Acute anemia due to acute blood loss from GIB  Recent Hx AVM on endoscopy   Severe CAD s/p PCI with DES 02/23/17  Baseline low BP   Plan:   Transfuse 2U PRBC in ED today   Monitor h/h  D/w cardiology regarding plavix   Seen by GI hold endoscopy per GI being on plavix  Continue Lipitor   D/w patient and his son in ED and ED physician   History of Present Illness:   [name] is a 83 y.o. male who presents to the hospital with abnormally low h/h in out patient labs   He noticed pasty maroon color stools for th epast few days   No cp, SOB, nausea vomiting  or abdominal pain   RES is negative for dizziness or lightheadedness   No hematamasis   Past Medical History:     Past Medical History:   Diagnosis Date   . Anemia     iron low   . DOE (dyspnea on exertion)     mild   . Glaucoma NEC    . Hypertensive disorder    . Lupus 1990   . Skin cancer     2006 removed mole   . Ulcerative colitis, chronic    . Vision abnormalities        Past Surgical History:     Past Surgical History:   Procedure Laterality Date   . CARDIAC SURGERY      3 stents   . EGD, COLONOSCOPY  04/03/2012    Procedure: EGD, COLONOSCOPY;  Surgeon: Lestine Mount, MD;  Location: [facility];  Service: Gastroenterology;  Laterality: N/A;   . EGD, COLONOSCOPY N/A 02/11/2017    Procedure: EGD, COLONOSCOPY;  Surgeon: Lestine Mount, MD;  Location: [id number];  Service: Gastroenterology;  Laterality: N/A;   . EYE SURGERY      cataract       Family History:   No family history on file.    Social History:     Social History     Social History   . Marital status: Married     Spouse name: N/A   . Number of children: N/A   . Years of education: N/A     Social History Main Topics   . Smoking status: Former Smoker     Packs/day: 1.00     Years: 12.00     Types: Cigarettes     Quit date: 12/18/1961   . Smokeless  tobacco: Never Used   . Alcohol use 0.0 oz/week      Comment: 0.5oz/d   . Drug use: No   . Sexual activity: Not on file     Other Topics Concern   . Not on file     Social History Narrative   . No narrative on file       Allergies:   No Known Allergies    Medications:     (Not in a hospital admission)    Review of Systems:   A comprehensive review of systems was: per HPI     Physical Exam:     Vitals:    03/03/17 1748   BP: 97/56   Pulse: 88   Resp: (!) 24   Temp: 99.1 F (37.3 C)   SpO2: 99%  Intake and Output Summary (Last 24 hours) at Date Time    Intake/Output Summary (Last 24 hours) at 03/03/17 1750  Last data filed at 03/03/17 1734   Gross per 24 hour   Intake              500 ml   Output                0 ml   Net              500 ml       NAD   ALERT AND ORIENT X3  EOMI, PEERLA , PALE SCLERA  S1, S2, CTA BILATERALLY   NEURO INTACT CRANIAL NERVES  NO PEDAL EDEMA   NO BONE DEFORMITY     Labs:     Results     Procedure Component Value Units Date/Time    Prepare/Crossmatch Red Blood Cells:  Two Units [[id number]] Collected:  03/03/17 1301     Updated:  03/03/17 1715     RBC Leukoreduced RBC Leukoreduced     BLUNIT [id number]     Status selected     PRODUCT CODE (NON READABLE) E0226V00     Expiration Date 562130865784     UTYPE O POS     RBC Leukoreduced RBC Leukoreduced     BLUNIT [id number]     Status issued     PRODUCT CODE (NON READABLE) E0226V00     Expiration Date 401027253664     UTYPE O POS    i-Stat CG4 Venous CartrIDge [[id number]] Collected:  03/03/17 1526     Updated:  03/03/17 1535     i-STAT pH Venous 7.423     i-STAT pCO2 Venous 32.2     i-STAT pO2 Venous 23.0     i-STAT HCO3 Bicarbonate Venous 21.0 mEq/L      i-STAT Total CO2 Venous 22.0 mEq/L      i-STAT Base Excess Venous -3.0 mEq/L      i-STAT O2 Saturation Venous 43.0 %      i-STAT Lactic acid 1.4 mmol/L      i-STAT Patient Temperature 98.2 F     i-STAT FIO2 21     i-STAT O2 Delivery Room Air     i-STAT Allen's Test NA     i-STAT ECMO  No     i-STAT Draw Site Venous    Type and Screen [[id number]] Collected:  03/03/17 1301    Specimen:  Blood Updated:  03/03/17 1412     ABO Rh O POS     AB Screen Gel NEG    CBC with differential [[id number]]  (Abnormal) Collected:  03/03/17 1301    Specimen:  Blood from Blood Updated:  03/03/17 1400     WBC 10.34 x10 3/uL      Hgb 5.7 (LL) g/dL      Hematocrit 66.0 (L) %      Platelets 652 (H) x10 3/uL      RBC 2.18 (L) x10 6/uL      MCV 90.4 fL      MCH 26.1 (L) pg      MCHC 28.9 (L) g/dL      RDW 21 (H) %      MPV 10.4 fL      Neutrophils 78.2 %      Lymphocytes Automated 5.0 %      Monocytes 15.2 %      Eosinophils Automated 0.8 %      Basophils Automated 0.3 %  Immature Granulocyte 0.5 %      Nucleated RBC 0.2 /100 WBC      Neutrophils Absolute 8.09 x10 3/uL      Abs Lymph Automated 0.52 x10 3/uL      Abs Mono Automated 1.57 (H) x10 3/uL      Abs Eos Automated 0.08 x10 3/uL      Absolute Baso Automated 0.03 x10 3/uL      Absolute Immature Granulocyte 0.05 x10 3/uL      Absolute NRBC 0.02 (H) x10 3/uL     GFR [[id number]] Collected:  03/03/17 1301     Updated:  03/03/17 1340     EGFR 28.4    Comprehensive metabolic panel [[id number]]  (Abnormal) Collected:  03/03/17 1301    Specimen:  Blood Updated:  03/03/17 1340     Glucose 109 (H) mg/dL      BUN 78.2 (H) mg/dL      Creatinine 2.2 (H) mg/dL      Sodium 956 mEq/L      Potassium 4.2 mEq/L      Chloride 105 mEq/L      CO2 17 (L) mEq/L      Calcium 8.6 mg/dL      Protein, Total 6.1 g/dL      Albumin 3.0 (L) g/dL      AST (SGOT) 12 U/L      ALT 14 U/L      Alkaline Phosphatase 84 U/L      Bilirubin, Total 0.4 mg/dL      Globulin 3.1 g/dL      Albumin/Globulin Ratio 1.0    PT/APTT [[id number]]  (Abnormal) Collected:  03/03/17 1301     Updated:  03/03/17 1339     PT 15.2 (H) sec      PT INR 1.2 (H)     PT Anticoag. Given Within 48 hrs. None     PTT 29 sec               Rads:   Radiological Procedure reviewed.    Signed by: Schuyler Amor MD  [phone]

## 2017-03-03 NOTE — Consults (Signed)
CONSULTATION    Date Time: 03/03/17 5:46 PM  Patient Name: [name],[name]  Requesting Physician: Susie Cassette, MD      Reason for Consultation:   Anemia  Assessment:   1.Anemia  2.CAD S/P  DES (12/5)  3.Afib  4.AVM  5.HTN  Plan:   -Will start pt on IV Venofer  -Cont to hold Plavix  -Cont with Protonix BID  -Will start pt on B12 1000 mcg IM daily   -PRBC Tx with Goal of Hb>7  Thank you consultation will cont to FU with you   History:   [name] is a 83 y.o. male who presents to the hospital on 03/03/2017 with Abnl labs, pt was known Hx of AVMs, was discharged on Plavix was noted to have low Hb and Hematology was consulted for further mgm     Past Medical History:     Past Medical History:   Diagnosis Date   . Anemia     iron low   . DOE (dyspnea on exertion)     mild   . Glaucoma NEC    . Hypertensive disorder    . Lupus 1990   . Skin cancer     2006 removed mole   . Ulcerative colitis, chronic    . Vision abnormalities        Past Surgical History:     Past Surgical History:   Procedure Laterality Date   . CARDIAC SURGERY      3 stents   . EGD, COLONOSCOPY  04/03/2012    Procedure: EGD, COLONOSCOPY;  Surgeon: Lestine Mount, MD;  Location: [facility];  Service: Gastroenterology;  Laterality: N/A;   . EGD, COLONOSCOPY N/A 02/11/2017    Procedure: EGD, COLONOSCOPY;  Surgeon: Lestine Mount, MD;  Location: [facility];  Service: Gastroenterology;  Laterality: N/A;   . EYE SURGERY      cataract       Family History:   No family history on file.    Social History:     Social History     Social History   . Marital status: Married     Spouse name: N/A   . Number of children: N/A   . Years of education: N/A     Social History Main Topics   . Smoking status: Former Smoker     Packs/day: 1.00     Years: 12.00     Types: Cigarettes     Quit date: 12/18/1961   . Smokeless tobacco: Never Used   . Alcohol use 0.0 oz/week      Comment: 0.5oz/d   . Drug use: No   . Sexual activity: Not on file     Other Topics Concern   .  Not on file     Social History Narrative   . No narrative on file       Allergies:   No Known Allergies    Medications:       Review of Systems:   A comprehensive review of systems was: History obtained from chart review and the patient  General ROS: negative for - chills, fatigue or fever  ENT ROS: negative for - epistaxis or headaches  Hematological and Lymphatic ROS: negative for - bleeding problems or night sweats  Respiratory ROS: no cough, shortness of breath, or wheezing  Cardiovascular ROS: no chest pain or dyspnea on exertion  Gastrointestinal ROS: no abdominal pain, change in bowel habits, or black or bloody stools  Genito-Urinary ROS: no dysuria,  trouble voiding, or hematuria  Musculoskeletal ROS: negative for - gait disturbance or joint pain  Neurological ROS: no TIA or stroke symptoms  Dermatological ROS: negative for acne, dry skin, eczema and hair changes    Physical Exam:     Vitals:    03/03/17 1733   BP: (!) 88/52   Pulse: 99   Resp: 18   Temp: 98.6 F (37 C)   SpO2:        Intake and Output Summary (Last 24 hours) at Date Time    Intake/Output Summary (Last 24 hours) at 03/03/17 1746  Last data filed at 03/03/17 1734   Gross per 24 hour   Intake              500 ml   Output                0 ml   Net              500 ml       General appearance: Elderly, well nourished, appears stated age and in NAD  Eyes: Sclera anicteric, pink conjunctivae, no ptosis  ENMT: mucous membranes moist, nose and ears appear normal.  Oropharynx clear.  Chest: Non labored respirations, no audible wheezing, no clubbing or cyanosis  CV:  Regular rate and rhythm, no JVD, no LE edema  Abdomen: soft, non-tender, non-distended, no masses or organomegaly  Skin: Normal color and turgor, no rashes, no suspicious skin lesions noted  Neuro: CN II-XII grossly intact.  No gross movement disorders noted.  Mental status: Appropriate affect, alert and oriented x 3    Labs Reviewed:     Results     Procedure Component Value Units  Date/Time    Prepare/Crossmatch Red Blood Cells:  Two Units [[id number]] Collected:  03/03/17 1301     Updated:  03/03/17 1715     RBC Leukoreduced RBC Leukoreduced     BLUNIT [id number]     Status selected     PRODUCT CODE (NON READABLE) E0226V00     Expiration Date [id number]     UTYPE O POS     RBC Leukoreduced RBC Leukoreduced     BLUNIT [id number]     Status issued     PRODUCT CODE (NON READABLE) E0226V00     Expiration Date [id number]     UTYPE O POS    i-Stat CG4 Venous CartrIDge [[id number]] Collected:  03/03/17 1526     Updated:  03/03/17 1535     i-STAT pH Venous 7.423     i-STAT pCO2 Venous 32.2     i-STAT pO2 Venous 23.0     i-STAT HCO3 Bicarbonate Venous 21.0 mEq/L      i-STAT Total CO2 Venous 22.0 mEq/L      i-STAT Base Excess Venous -3.0 mEq/L      i-STAT O2 Saturation Venous 43.0 %      i-STAT Lactic acid 1.4 mmol/L      i-STAT Patient Temperature 98.2 F     i-STAT FIO2 21     i-STAT O2 Delivery Room Air     i-STAT Allen's Test NA     i-STAT ECMO No     i-STAT Draw Site Venous    Type and Screen [[id number]] Collected:  03/03/17 1301    Specimen:  Blood Updated:  03/03/17 1412     ABO Rh O POS     AB Screen Gel NEG    CBC with differential [[id number]]  (Abnormal) Collected:  03/03/17  1301    Specimen:  Blood from Blood Updated:  03/03/17 1400     WBC 10.34 x10 3/uL      Hgb 5.7 (LL) g/dL      Hematocrit 11.9 (L) %      Platelets 652 (H) x10 3/uL      RBC 2.18 (L) x10 6/uL      MCV 90.4 fL      MCH 26.1 (L) pg      MCHC 28.9 (L) g/dL      RDW 21 (H) %      MPV 10.4 fL      Neutrophils 78.2 %      Lymphocytes Automated 5.0 %      Monocytes 15.2 %      Eosinophils Automated 0.8 %      Basophils Automated 0.3 %      Immature Granulocyte 0.5 %      Nucleated RBC 0.2 /100 WBC      Neutrophils Absolute 8.09 x10 3/uL      Abs Lymph Automated 0.52 x10 3/uL      Abs Mono Automated 1.57 (H) x10 3/uL      Abs Eos Automated 0.08 x10 3/uL      Absolute Baso Automated 0.03 x10 3/uL      Absolute Immature  Granulocyte 0.05 x10 3/uL      Absolute NRBC 0.02 (H) x10 3/uL     GFR [[id number]] Collected:  03/03/17 1301     Updated:  03/03/17 1340     EGFR 28.4    Comprehensive metabolic panel [[id number]]  (Abnormal) Collected:  03/03/17 1301    Specimen:  Blood Updated:  03/03/17 1340     Glucose 109 (H) mg/dL      BUN 69.6 (H) mg/dL      Creatinine 2.2 (H) mg/dL      Sodium 295 mEq/L      Potassium 4.2 mEq/L      Chloride 105 mEq/L      CO2 17 (L) mEq/L      Calcium 8.6 mg/dL      Protein, Total 6.1 g/dL      Albumin 3.0 (L) g/dL      AST (SGOT) 12 U/L      ALT 14 U/L      Alkaline Phosphatase 84 U/L      Bilirubin, Total 0.4 mg/dL      Globulin 3.1 g/dL      Albumin/Globulin Ratio 1.0    PT/APTT [[id number]]  (Abnormal) Collected:  03/03/17 1301     Updated:  03/03/17 1339     PT 15.2 (H) sec      PT INR 1.2 (H)     PT Anticoag. Given Within 48 hrs. None     PTT 29 sec               Rads:   Xr Chest  Ap Portable    Result Date: 03/03/2017   Interstitial prominence with bibasilar airspace disease and a probable small left pleural effusion. The findings could be related to edema in the appropriate clinical setting however infection cannot be excluded. Marius Ditch, MD 03/03/2017 1:06 PM    Radiological Procedure reviewed.     Signed by: Cartez Bellow

## 2017-03-03 NOTE — ED Provider Notes (Addendum)
Dr. Wilma Flavin, acting as triage physician, entered orders to expedite patient care.    83 y.o. F with h/o CAD, A. Fib, CHF, V. Tach, CKD discharged on 12/5 with GI bleed. Followed up yesterday for labs and called today for low H/H. Hemoglobin 5.8. Pt c/o fatigue and feeling lightheaded.     Jobe Gibbon, MD  03/03/17 1233       Jobe Gibbon, MD  03/03/17 1234       Jobe Gibbon, MD  03/03/17 [phone]

## 2017-03-03 NOTE — Consults (Signed)
[facility]- Medical Critical Care Service [facility])      Consultation Note    Date Time: 03/03/17 6:17 PM  Patient Name: [name],[name]  Attending Physician: Susie Cassette, MD  Primary Care Physician: Philipp Ovens, MD  Location/Room: N 35/N [phone]     [facility] Consult requested by: ED      Chief Complaint / Primary Reason for ICU evaluation :      Chief Complaint   Patient presents with   . Abnormal Lab     HPI: Patient is a  83 yo with afib CAD s/p DES on 12/5 who presented with fatigue and dizzyness for the last day. Patient was recently discharged from the hospital. No hematemesis melena hemoptysis cough shortness of breath chest pain fevers chills rigors sick contacts. Patient was found to have a guaic positive stool in the ER and was hypotensive to the 80 which according to the son is baseline        Assessment:       Problem List:   Active Hospital Problems    Diagnosis   . GI bleed     CAD        Plan:     Patient appears to be close to baseline. Patient was not orthostatic upon exam with no overt bleed.   - GI consult to re evaluate for bleed. Monitor H&H.   - Cardiology consult to adjust anti hypertensive as well as antiplatelets in the setting of bleed    Patient does not need ICu level of care at this time. Please call if condition changes. Discussed with patient his son as well as ED attending    Past Medical History:     Past Medical History:   Diagnosis Date   . Anemia     iron low   . DOE (dyspnea on exertion)     mild   . Glaucoma NEC    . Hypertensive disorder    . Lupus 1990   . Skin cancer     2006 removed mole   . Ulcerative colitis, chronic    . Vision abnormalities        Available old records reviewed, including:      Past Surgical History:     Past Surgical History:   Procedure Laterality Date   . CARDIAC SURGERY      3 stents   . EGD, COLONOSCOPY  04/03/2012    Procedure: EGD, COLONOSCOPY;  Surgeon: Lestine Mount, MD;  Location: [facility];  Service: Gastroenterology;  Laterality:  N/A;   . EGD, COLONOSCOPY N/A 02/11/2017    Procedure: EGD, COLONOSCOPY;  Surgeon: Lestine Mount, MD;  Location: [facility];  Service: Gastroenterology;  Laterality: N/A;   . EYE SURGERY      cataract       Family History:   No family history on file.    Social History:     Social History     Social History   . Marital status: Married     Spouse name: N/A   . Number of children: N/A   . Years of education: N/A     Social History Main Topics   . Smoking status: Former Smoker     Packs/day: 1.00     Years: 12.00     Types: Cigarettes     Quit date: 12/18/1961   . Smokeless tobacco: Never Used   . Alcohol use 0.0 oz/week      Comment: 0.5oz/d   .  Drug use: No   . Sexual activity: Not on file     Other Topics Concern   . Not on file     Social History Narrative   . No narrative on file       Allergies:   No Known Allergies        Medications:     (Not in a hospital admission)    Current Inpatient :  Current Facility-Administered Medications   Medication Dose Route Frequency   . cyanocobalamin  1,000 mcg Intramuscular Daily   . iron sucrose  200 mg Intravenous Q24H [facility]       Home Medications :     Prior to Admission medications    Medication Sig Start Date End Date Taking? Authorizing Provider   FUROSEMIDE PO Take by mouth.   Yes [provider]   aspirin 81 MG chewable tablet Chew 1 tablet (81 mg total) by mouth daily. 02/24/17   Schuyler Amor, MD   atorvastatin (LIPITOR) 80 MG tablet Take 1 tablet (80 mg total) by mouth nightly. 02/23/17   Schuyler Amor, MD   carvedilol (COREG) 3.125 MG tablet Take 1 tablet (3.125 mg total) by mouth every 12 (twelve) hours. 02/23/17 03/25/17  Schuyler Amor, MD   clopidogrel (PLAVIX) 75 mg tablet Take 1 tablet (75 mg total) by mouth daily. 02/24/17   Schuyler Amor, MD   IRON PO Take 200 mg by mouth daily.    [provider]   lisinopril (PRINIVIL,ZESTRIL) 10 MG tablet Take 1 tablet (10 mg total) by mouth every 12 (twelve) hours. 02/23/17 03/25/17  Schuyler Amor, MD    pantoprazole (PROTONIX) 40 MG tablet Take 1 tablet (40 mg total) by mouth 2 (two) times daily. 02/23/17 03/25/17  Schuyler Amor, MD            Review of Systems:   All other systems were reviewed and are negative except:   Pertinent items are noted in HPI.    Physical Exam:     Vitals:    03/03/17 1800   BP: 94/50   Pulse: 88   Resp: (!) 23   Temp:    SpO2: 100%       Intake and Output Summary (Last 24 hours) at Date Time    Intake/Output Summary (Last 24 hours) at 03/03/17 1817  Last data filed at 03/03/17 1734   Gross per 24 hour   Intake              500 ml   Output                0 ml   Net              500 ml       General Appearance:  alert, well appearing, and in no distress  Mental status:  alert, oriented to person, place, and time, normal mood, behavior, speech, dress, motor activity, and thought processes  Neuro:  alert, oriented, normal speech, no focal findings or movement disorder noted, screening mental status exam normal, neck supple without rigidity, cranial nerves II through XII intact  Neck:supple, no significant adenopathy  Lungs: clear to auscultation, no wheezes, rales or rhonchi, symmetric air entry  Cardiac: normal rate, regular rhythm, normal S1, S2, no murmurs, rubs, clicks or gallops  Abdomen:  soft, nontender, nondistended, no masses or organomegaly  Extremities: peripheral pulses normal, no pedal edema, no clubbing or cyanosis  Labs:           Labs (last 72 hours):  Recent Labs      03/03/17   1301   WBC  10.34   Hgb  5.7*   Hematocrit  19.7*     Recent Labs      03/03/17   1301   PT  15.2*   PT INR  1.2*   PTT  29    Recent Labs      03/03/17   1301   Sodium  137   Potassium  4.2   Chloride  105   CO2  17*   BUN  49.0*   Creatinine  2.2*   Glucose  109*   Calcium  8.6                   Radiology / Imaging:     Imaging personally reviewed by me  including:      XR Chest  AP Portable   Final Result    Interstitial prominence with bibasilar airspace disease and   a probable  small left pleural effusion. The findings could be related to   edema in the appropriate clinical setting however infection cannot be   excluded.      Marius Ditch, MD    03/03/2017 1:06 PM            Other ICU Data     Invasive ICU Hemodynamics:          Line, Drains,Airway :   Patient Lines/Drains/Airways Status    Active PICC Line / CVC Line / PIV Line / Drain / Airway / Intraosseous Line / Epidural Line / ART Line / Line / Wound / Pressure Ulcer / NG/OG Tube     Name:   Placement date:   Placement time:   Site:   Days:    Peripheral IV 03/03/17 Left Antecubital  03/03/17    1306    Antecubital    less than 1                IBW:           I have personally reviewed the patient's history and 24 hour interval events, along with vitals, labs, radiology images and  additional findings found in detail within ICU team notes from house staff, NPPs and nursing, with their care plans developed with and reviewed by me.         Signed by: Sharyn Lull  ZO:XWRU, Trish Fountain, MD

## 2017-03-03 NOTE — Consults (Signed)
[facility]   Creedence Heiss M. Lucianne Muss, MD, [facility]  Office: [phone]  Cell phone [phone]        Date Time: 03/03/17 6:55 PM  Patient Name: [name],[name]  Requesting Physician: Susie Cassette, MD       Reason for Consultation:   SOB, GIB, recent NSTEMI s/p PCI/stent on DAPT  Assessment:    Acute GI bleed   Severe symptomatic anemia   Acute kidney injury   Hypotension   Tachycardia   Two-vessel obstructive CAD s/p PCI/DES to LM/LCx on 02/22/17.    Ventricular tachycardia, non-sustained, recorded on tele on 02/10/17 at 12:07   Atrial fibrillation, paroxysmal. Patient with high risk of rebleed with use of anticoagulation given multiple AVMs (stomach, duodenum and cecum) noted on endoscopic procedures.   CHF with reduced EF of 36%   Severe pulmonary hypertension with an estimated PASP of 63 mmHg by echo   Dyslipidemia   Plan:    Admit to ICU   Hold ASA 81 mg daily and Plavix 75   Check PRU    Check Tn series   Check BNP   PRBC transfusion to keep Hb>9   Lipitor 80 mg qhs   Hold Coreg 3.125 mg bid for hypotension, AKI   Hold Lisinopril 10 mg bid for acute kidney injury  Recent Cardiac Work Up:    EKG 03/03/17:   SR, IRBBB, PACs, NS T wave abnormality, prolonged QT  Echo 02/11/17:   The left ventricle is normal in size.  * Left ventricular systolic function is moderately decreased with an  ejection fraction by Biplane Method of Discs of 36 %.  * Left Ventricular diastolic filling parameters are consistent with Grade II  diastolic dysfunction (pseudonormal pattern).  * Right ventricular size and systolic function are normal.  * Mild bi-atrial enlargement.  * Moderate to severe mitral valve regurgitation.  * There is mild tricuspid regurgitation.  * Moderate pulmonary hypertension with estimated right ventricular systolic  pressure of 63.29 mmHg.  Coronary Angiography 02/18/17:     LM: 100% occluded distally.    LAD: 100% occluded at ostium.     LCx: Non-dominant vessel  and 100% occluded at ostium.   RCA: Large dominant vessel giving rise to RPDA and RPLB and feeding the left coronary arteries via collaterals. RCA has mild diffuse disease w/o focal stenosis. RPLB is a large branching vessel w/o any significant stenosis. RPDA is a medium size vessel and is subtotally occluded in mid segment.   He underwent PCI/DES to LM/LCx on 02/22/17 and was started on ASA 81 and Plavix 75.   History:   [name] is a 83 y.o. male who presents to the hospital on 03/03/2017 with abnormal lab showing low H/H. He was recently hospitalized at [facility] from 02/09/2017- 02/23/17 for GI bleeding, NSTEMI, ventricular tachycardia and atrial fibrillation.     He underwent cardiac cath on 02/18/17 which showed very severe CAD (see above) and he received PCI/DES to LM/LCx on 02/22/17 and was started on ASA 81 and Plavix 75.     For his atrial fibrillation which converted to NSR spontaneously, it was decided not to be started on anticoagulation therapy due to high risk of GI bleeding given multiple AVMs (stomach, duodenum and cecum) noted on endoscopic procedures. He was to be evaluated for a Watchman device at a later time.     He was doing well until this morning when he felt a little short of breath  with exertion. Denies any chest pain. He received a call from his doctor's office that his Hb is low and to go to ER. He does not endorse seeing any bleeding but he says he did not look into his stool.   Past Medical History:     Past Medical History:   Diagnosis Date   . Anemia     iron low   . DOE (dyspnea on exertion)     mild   . Glaucoma NEC    . Hypertensive disorder    . Lupus 1990   . Skin cancer     2006 removed mole   . Ulcerative colitis, chronic    . Vision abnormalities        Past Surgical History:     Past Surgical History:   Procedure Laterality Date   . CARDIAC SURGERY      3 stents   . EGD, COLONOSCOPY  04/03/2012    Procedure: EGD, COLONOSCOPY;  Surgeon: Lestine Mount, MD;  Location:  [facility];  Service: Gastroenterology;  Laterality: N/A;   . EGD, COLONOSCOPY N/A 02/11/2017    Procedure: EGD, COLONOSCOPY;  Surgeon: Lestine Mount, MD;  Location: [facility];  Service: Gastroenterology;  Laterality: N/A;   . EYE SURGERY      cataract       Family History:   No family history on file.    Social History:     Social History     Social History   . Marital status: Married     Spouse name: N/A   . Number of children: N/A   . Years of education: N/A     Social History Main Topics   . Smoking status: Former Smoker     Packs/day: 1.00     Years: 12.00     Types: Cigarettes     Quit date: 12/18/1961   . Smokeless tobacco: Never Used   . Alcohol use 0.0 oz/week      Comment: 0.5oz/d   . Drug use: No   . Sexual activity: Not on file     Other Topics Concern   . Not on file     Social History Narrative   . No narrative on file       Allergies:   No Known Allergies    Medications:     Current Facility-Administered Medications   Medication Dose Route Frequency   . cyanocobalamin  1,000 mcg Intramuscular Daily   . iron sucrose  200 mg Intravenous Q24H [facility]       Review of Systems:   Pertinent items are noted in HPI.    Physical Exam:   BP 99/55   Pulse 90   Temp 99.1 F (37.3 C) (Oral)   Resp (!) 23   Ht 1.549 m (5\' 1" )   Wt 60.3 kg (133 lb)   SpO2 100%   BMI 25.13 kg/m   General: awake, alert, oriented x 3; no acute distress.   HEENT: no pallor, sclera anicteric oropharynx clear without lesions, mucous membranes moist   Neck: supple, no lymphadenopathy, no thyromegaly, no JVD, no carotid bruits   Cardiovascular: regular rate and rhythm, no murmurs, rubs or gallops   Lungs: clear to auscultation bilaterally, without wheezing, rhonchi, or rales   Abdomen: soft, non-tender, non-distended; normoactive bowel sounds  Extremities: no clubbing, cyanosis, or edema   Neuro: cranial nerves grossly intact, no focal findings   Skin:Warm, dry      Intake and Output Summary (Last  24 hours) at Date  Time    Intake/Output Summary (Last 24 hours) at 03/03/17 1855  Last data filed at 03/03/17 1734   Gross per 24 hour   Intake              500 ml   Output                0 ml   Net              500 ml         Labs Reviewed:     Results     Procedure Component Value Units Date/Time    Prepare/Crossmatch Red Blood Cells:  Two Units [[id number]] Collected:  03/03/17 1301     Updated:  03/03/17 1715     RBC Leukoreduced RBC Leukoreduced     BLUNIT [id number]     Status selected     PRODUCT CODE (NON READABLE) E0226V00     Expiration Date [id number]     UTYPE O POS     RBC Leukoreduced RBC Leukoreduced     BLUNIT [id number]     Status issued     PRODUCT CODE (NON READABLE) E0226V00     Expiration Date [id number]     UTYPE O POS    i-Stat CG4 Venous CartrIDge [[id number]] Collected:  03/03/17 1526     Updated:  03/03/17 1535     i-STAT pH Venous 7.423     i-STAT pCO2 Venous 32.2     i-STAT pO2 Venous 23.0     i-STAT HCO3 Bicarbonate Venous 21.0 mEq/L      i-STAT Total CO2 Venous 22.0 mEq/L      i-STAT Base Excess Venous -3.0 mEq/L      i-STAT O2 Saturation Venous 43.0 %      i-STAT Lactic acid 1.4 mmol/L      i-STAT Patient Temperature 98.2 F     i-STAT FIO2 21     i-STAT O2 Delivery Room Air     i-STAT Allen's Test NA     i-STAT ECMO No     i-STAT Draw Site Venous    Type and Screen [[id number]] Collected:  03/03/17 1301    Specimen:  Blood Updated:  03/03/17 1412     ABO Rh O POS     AB Screen Gel NEG    CBC with differential [[id number]]  (Abnormal) Collected:  03/03/17 1301    Specimen:  Blood from Blood Updated:  03/03/17 1400     WBC 10.34 x10 3/uL      Hgb 5.7 (LL) g/dL      Hematocrit 66.0 (L) %      Platelets 652 (H) x10 3/uL      RBC 2.18 (L) x10 6/uL      MCV 90.4 fL      MCH 26.1 (L) pg      MCHC 28.9 (L) g/dL      RDW 21 (H) %      MPV 10.4 fL      Neutrophils 78.2 %      Lymphocytes Automated 5.0 %      Monocytes 15.2 %      Eosinophils Automated 0.8 %      Basophils Automated 0.3 %      Immature  Granulocyte 0.5 %      Nucleated RBC 0.2 /100 WBC      Neutrophils Absolute 8.09 x10 3/uL      Abs Lymph Automated 0.52 x10 3/uL  Abs Mono Automated 1.57 (H) x10 3/uL      Abs Eos Automated 0.08 x10 3/uL      Absolute Baso Automated 0.03 x10 3/uL      Absolute Immature Granulocyte 0.05 x10 3/uL      Absolute NRBC 0.02 (H) x10 3/uL     GFR [[id number]] Collected:  03/03/17 1301     Updated:  03/03/17 1340     EGFR 28.4    Comprehensive metabolic panel [[id number]]  (Abnormal) Collected:  03/03/17 1301    Specimen:  Blood Updated:  03/03/17 1340     Glucose 109 (H) mg/dL      BUN 78.2 (H) mg/dL      Creatinine 2.2 (H) mg/dL      Sodium 956 mEq/L      Potassium 4.2 mEq/L      Chloride 105 mEq/L      CO2 17 (L) mEq/L      Calcium 8.6 mg/dL      Protein, Total 6.1 g/dL      Albumin 3.0 (L) g/dL      AST (SGOT) 12 U/L      ALT 14 U/L      Alkaline Phosphatase 84 U/L      Bilirubin, Total 0.4 mg/dL      Globulin 3.1 g/dL      Albumin/Globulin Ratio 1.0    PT/APTT [[id number]]  (Abnormal) Collected:  03/03/17 1301     Updated:  03/03/17 1339     PT 15.2 (H) sec      PT INR 1.2 (H)     PT Anticoag. Given Within 48 hrs. None     PTT 29 sec               Signed by: Valerie Roys, MD, [facility]

## 2017-03-04 LAB — COMPREHENSIVE METABOLIC PANEL
ALT: 13 U/L (ref 0–55)
AST (SGOT): 13 U/L (ref 5–34)
Albumin/Globulin Ratio: 1 (ref 0.9–2.2)
Albumin: 2.7 g/dL — ABNORMAL LOW (ref 3.5–5.0)
Alkaline Phosphatase: 79 U/L (ref 38–106)
BUN: 42 mg/dL — ABNORMAL HIGH (ref 9.0–28.0)
Bilirubin, Total: 1.2 mg/dL (ref 0.2–1.2)
CO2: 18 mEq/L — ABNORMAL LOW (ref 22–29)
Calcium: 8.3 mg/dL (ref 7.9–10.2)
Chloride: 108 mEq/L (ref 100–111)
Creatinine: 1.6 mg/dL — ABNORMAL HIGH (ref 0.7–1.3)
Globulin: 2.8 g/dL (ref 2.0–3.6)
Glucose: 93 mg/dL (ref 70–100)
Potassium: 3.8 mEq/L (ref 3.5–5.1)
Protein, Total: 5.5 g/dL — ABNORMAL LOW (ref 6.0–8.3)
Sodium: 137 mEq/L (ref 136–145)

## 2017-03-04 LAB — CBC AND DIFFERENTIAL
Absolute NRBC: 0.02 10*3/uL — ABNORMAL HIGH
Basophils Absolute Automated: 0.02 10*3/uL (ref 0.00–0.20)
Basophils Automated: 0.3 %
Eosinophils Absolute Automated: 0.09 10*3/uL (ref 0.00–0.70)
Eosinophils Automated: 1.2 %
Hematocrit: 23.3 % — ABNORMAL LOW (ref 42.0–52.0)
Hgb: 7.2 g/dL — ABNORMAL LOW (ref 13.0–17.0)
Immature Granulocytes Absolute: 0.04 10*3/uL
Immature Granulocytes: 0.5 %
Lymphocytes Absolute Automated: 0.53 10*3/uL (ref 0.50–4.40)
Lymphocytes Automated: 6.9 %
MCH: 27.7 pg — ABNORMAL LOW (ref 28.0–32.0)
MCHC: 30.9 g/dL — ABNORMAL LOW (ref 32.0–36.0)
MCV: 89.6 fL (ref 80.0–100.0)
MPV: 10.4 fL (ref 9.4–12.3)
Monocytes Absolute Automated: 1.39 10*3/uL — ABNORMAL HIGH (ref 0.00–1.20)
Monocytes: 18.1 %
Neutrophils Absolute: 5.61 10*3/uL (ref 1.80–8.10)
Neutrophils: 73 %
Nucleated RBC: 0.3 /100 WBC (ref 0.0–1.0)
Platelets: 465 10*3/uL — ABNORMAL HIGH (ref 140–400)
RBC: 2.6 10*6/uL — ABNORMAL LOW (ref 4.70–6.00)
RDW: 18 % — ABNORMAL HIGH (ref 12–15)
WBC: 7.68 10*3/uL (ref 3.50–10.80)

## 2017-03-04 LAB — CROSSMATCH PRBCS, 2 UNITS
Expiration Date: 201901132359
Expiration Date: 201901132359
Status: TRANSFUSED
Status: TRANSFUSED
UTYPE: O POS
UTYPE: O POS

## 2017-03-04 LAB — GFR: EGFR: 41

## 2017-03-04 LAB — HEMOGLOBIN AND HEMATOCRIT, BLOOD
Hematocrit: 32.4 % — ABNORMAL LOW (ref 42.0–52.0)
Hgb: 10.2 g/dL — ABNORMAL LOW (ref 13.0–17.0)

## 2017-03-04 LAB — TROPONIN I: Troponin I: 0.04 ng/mL (ref 0.00–0.09)

## 2017-03-04 LAB — B-TYPE NATRIURETIC PEPTIDE: B-Natriuretic Peptide: 1745 pg/mL — ABNORMAL HIGH (ref 0–100)

## 2017-03-04 LAB — PRU: PRU: 123 — ABNORMAL LOW (ref 194–418)

## 2017-03-04 MED ORDER — SODIUM CHLORIDE 0.9 % IV SOLN
INTRAVENOUS | Status: DC | PRN
Start: 2017-03-04 — End: 2017-03-07

## 2017-03-04 NOTE — Plan of Care (Signed)
Problem: Moderate/High Fall Risk Score >5  Goal: Patient will remain free of falls  Outcome: Progressing   03/04/17 1000   OTHER   Moderate Risk (6-13) MOD-Floor mat at bedside (where available) if appropriate;MOD-Use of chair-pad alarm when appropriate;MOD-(VH Only) Apply yellow "Fall Risk" arm band;MOD-(VH Only) Yellow slippers;MOD-(VH Only) Yellow "Fall Risk" signage       Problem: Safety  Goal: Patient will be free from injury during hospitalization  Outcome: Progressing   03/04/17 1014   Goal/Interventions addressed this shift   Patient will be free from injury during hospitalization  Use appropriate transfer methods;Provide and maintain safe environment     Goal: Patient will be free from infection during hospitalization  Outcome: Progressing   03/04/17 1014   Goal/Interventions addressed this shift   Free from Infection during hospitalization Assess and monitor for signs and symptoms of infection;Monitor lab/diagnostic results       Problem: Discharge Barriers  Goal: Patient will be discharged home or other facility with appropriate resources  Outcome: Progressing   03/04/17 1014   Goal/Interventions addressed this shift   Discharge to home or other facility with appropriate resources Provide appropriate patient education       Problem: Psychosocial and Spiritual Needs  Goal: Demonstrates ability to cope with hospitalization/illness  Outcome: Progressing   03/04/17 1014   Goal/Interventions addressed this shift   Demonstrates ability to cope with hospitalizations/illness Provide quiet environment;Encourage verbalization of feelings/concerns/expectations;Assist patient to identify own strengths and abilities       Problem: Altered GI Function  Goal: Mobility/Activity is maintained at optimal level for patient  Outcome: Progressing   03/04/17 1014   Goal/Interventions addressed this shift   Mobility/activity is maintained at optimal level for patient Increase mobility as tolerated/progressive mobility     Goal:  No bleeding  Outcome: Progressing   03/04/17 1014   Goal/Interventions addressed this shift   No bleeding  Monitor/assess lab values and report abnormal values

## 2017-03-04 NOTE — Progress Notes (Signed)
[facility] ADMISSION    Admitted from:ED, PIR:[id number], family:None present    Originally stays YT:KZSW    Orientation:Staff, room, whiteboard, and call bell. Call bell and belongings within reach.    Belongings:Clothing, glasses    4 Eyes/Dual Skin Assessment:Annie Mensah, RN    Skin Assess findings:Scattered bruising, blanchable redness to gluteal fold    Wound care Consult: No    Admission Note: Pt AOx4, admission completed, VSS.

## 2017-03-04 NOTE — Progress Notes (Signed)
2nd unit of blood completed. VSS, no s/s of adverse reaction. Pt tolerated well.

## 2017-03-04 NOTE — UM Notes (Signed)
Admit to Inpatient [[id number]]     Electronically signed by: Despina Hidden on 03/03/17 1607 Status: Completed   Ordering user: Despina Hidden 03/03/17 1607 Ordering provider: Susie Cassette, MD   Authorized by: Susie Cassette, MD   Cosigning events  Electronically cosigned by Schuyler Amor, MD 03/03/17 1745 for Ordering   Add Signature Requirement   Frequency: Once 03/03/17 1607 - 1 Occurrences   Questionnaire     Question Answer   Admitting Physician York Grice, SYED N   Diagnosis GI bleed   Estimated Length of Stay > or = to 2 midnights   Tentative Discharge Plan? Home or Self Care   Patient Class Inpatient     83 y.o. male who presents to the hospital with abnormally low h/h in out patient labs   He noticed pasty maroon color stools for the past few days     12/13 CXR: Interstitial prominence with bibasilar airspace disease and a probable small left pleural effusion. The findings could be related to edema in the appropriate clinical setting however infection cannot be excluded.    Plan:   Transfuse 2U PRBC in ED today   Monitor h/h  D/w cardiology regarding plavix   Seen by GI hold endoscopy per GI being on plavix  Continue Lipitor     12/13 Oncology consult:  Reason for Consultation:   Anemia  Plan:   -Will start pt on IV Venofer  -Cont to hold Plavix  -Cont with Protonix BID  -Will start pt on B12 1000 mcg IM daily   -PRBC Tx with Goal of Hb>7    12/13 Cardiology consult:  Reason for Consultation:   SOB, GIB, recent NSTEMI s/p PCI/stent on DAPT  Plan:    Admit to ICU   Hold ASA 81 mg daily and Plavix 75   Check PRU    Check Tn series   Check BNP   PRBC transfusion to keep Hb>9   Lipitor 80 mg qhs   Hold Coreg 3.125 mg bid for hypotension, AKI   Hold Lisinopril 10 mg bid for acute kidney injury    12/13 Critical care consult:  Patient appears to be close to baseline. Patient was not orthostatic upon exam with no overt bleed.   - GI consult to re evaluate for bleed. Monitor H&H.   - Cardiology consult to  adjust anti hypertensive as well as antiplatelets in the setting of bleed    Patient does not need ICU level of care at this time. Please call if condition changes.    12/13 Gastroenterology consult:  Plan:  1. Continue to trend H/H, transfuse prn.  2. Continue Protonix 40 mg BID.  3. Endoscopic intervention is limited in the setting of dual anti-platelet therapy. Ideally would need to hold Plavix for 5 days before any intervention can be performed. Given recent drug eluting stent placement on 12/4, suspect cardiology would feel holding these agents is too high risk.   4. Continue supportive care. If he becomes hemodynamically unstable, may need to proceed with high risk urgent endoscopic evaluation. If cardiology is ok with holding Plavix, may consider endoscopic evaluation next week.    12/14 Medicine note:  Plan:   S/p 2 units PRBC with improvement of h/h with hx CAD will give additional 2 units today   Hold plavix and aspirin cardiology following   GI following     12/13 Labs: BUN 49, Creat 2.2, CO2 17, PT 15.2, INR 1.2, H&H 5.7/19.7, plt 652, RBC 2.18,  BNP 1745    VS: T98, P69, R16, 86/51, 96%    Gillian Scarce  UR Case Manager, MSN, RN  [employer]  [phone] (voicemail)

## 2017-03-04 NOTE — Progress Notes (Incomplete)
Medicine Shift Note    Patient Lines/Drains/Airways Status    Active Lines, Drains and Airways     None                Last BM: 03/03/17    Pending Orders: PRU 123    Discharge Plan: {comment}    POC: {who was updated}    Skin: {any wound? Consult}    Tele: {yes/no, comment why}    Activity: {patient's level of participation/movement}    Interpreter Needs: {yes/no}, Used: {yes/NO}, Method: {type}, Waiver Need/present: {comment}    Shift Note: {comment}

## 2017-03-04 NOTE — Progress Notes (Signed)
[facility]   [name] M. Lucianne Muss, MD, [facility]  PROGRESS NOTE  Office: [phone]  Cell phone [phone]                                       Date Time: 03/04/17 4:21 PM  Patient Name: [name],[name]      Assessment:    Acute GI bleed   Severe symptomatic anemia   Acute kidney injury   Hypotension   Tachycardia   Two-vessel obstructive CAD s/p PCI/DES to LM/LCx on 02/22/17.    Ventricular tachycardia, non-sustained, recorded on tele on 02/10/17 at 12:07   Atrial fibrillation, paroxysmal. Patient with high risk of rebleed with use of anticoagulation given multiple AVMs (stomach, duodenum and cecum) noted on endoscopic procedures.   CHF with reduced EF of 36%   Severe pulmonary hypertension with an estimated PASP of 63 mmHg by echo   Dyslipidemia   Plan:    Hold ASA 81 mg daily and Plavix 75 for now- PRU 123 on 03/04/17 indicating enough antiplatelet activity for the time being. He is normoactive to plavix (not resistant, and not hyperreactive). Will consider restarting plavix w/o aspirin tomorrow depending on his bleeding status and trend of H/H. Will also trend troponin.    PRBC transfusion to keep Hb>9   Lipitor 80 mg qhs   Hold Coreg 3.125 mg bid for hypotension, AKI   Hold Lisinopril 10 mg bid for acute kidney injury  Recent Cardiac Work up:   EKG 03/03/17:   SR, IRBBB, PACs, NS T wave abnormality, prolonged QT  Echo 02/11/17:   The left ventricle is normal in size.  * Left ventricular systolic function is moderately decreased with an  ejection fraction by Biplane Method of Discs of 36 %.  * Left Ventricular diastolic filling parameters are consistent with Grade II  diastolic dysfunction (pseudonormal pattern).  * Right ventricular size and systolic function are normal.  * Mild bi-atrial enlargement.  * Moderate to severe mitral valve regurgitation.  * There is mild tricuspid regurgitation.  * Moderate pulmonary hypertension with estimated right ventricular  systolic  pressure of 63.29 mmHg.  Coronary Angiography 02/18/17:   LM: 100% occluded distally.    LAD: 100% occluded at ostium.    LCx: Non-dominant vessel and 100% occluded at ostium.   RCA: Large dominant vessel giving rise to RPDA and RPLB and feeding the left coronary arteries via collaterals. RCA has mild diffuse disease w/o focal stenosis. RPLB is a large branching vessel w/o any significant stenosis. RPDA is a medium size vessel and is subtotally occluded in mid segment.   He underwent PCI/DES to LM/LCx on 02/22/17 and was started on ASA 81 and Plavix 75.   Subjective:   He is feeling better this morning. No CP or SOB at rest. No gross bleeding from anywhere.   Medications:     Current Facility-Administered Medications   Medication Dose Route Frequency   . atorvastatin  80 mg Oral QHS   . cyanocobalamin  1,000 mcg Intramuscular Q24H   . iron sucrose  200 mg Intravenous Q24H   . pantoprazole  40 mg Intravenous BID         Physical Exam:     Vitals:    03/04/17 1433   BP: 93/56   Pulse: 80   Resp: 16   Temp: 98.5 F (36.9 C)   SpO2:  Intake and Output Summary (Last 24 hours) at Date Time    Intake/Output Summary (Last 24 hours) at 03/04/17 1621  Last data filed at 03/04/17 1252   Gross per 24 hour   Intake             1123 ml   Output              200 ml   Net              923 ml       General appearance - in no distress   Head: NC/AT, mm moist  Neck - supple, Carotids 2+, no bruit, no JVD   Chest - clear to auscultation, no wheezes, rales or rhonchi, symmetric air entry   Heart - normal rate and regular rhythm, S1 and S2 normal, no murmurs   Abdomen - soft, nontender, nondistended, no masses or organomegaly   Extremities - peripheral pulses normal, no pedal edema, no clubbing or cyanosis        Labs:   Recent CBC Recent Labs      03/04/17   0227   RBC  2.60*   Hgb  7.2*   Hematocrit  23.3*   MCV  89.6   MCH  27.7*   MCHC  30.9*   RDW  18*   MPV  10.4   Platelets  465*     Recent BMP Recent Labs       03/04/17   0227   Glucose  93   BUN  42.0*   Creatinine  1.6*   Calcium  8.3   Sodium  137   Potassium  3.8   Chloride  108   CO2  18*     Recent CMP   Recent Labs      03/04/17   0227   Glucose  93   BUN  42.0*   Creatinine  1.6*   Sodium  137   CO2  18*   Calcium  8.3   Albumin  2.7*   AST (SGOT)  13   ALT  13   Globulin  2.8     Recent CARDIAC ENZYMES No results for input(s): TROPONIN, ISTATTROPONI, CK, CKMB in the last 24 hours.    Invalid input(s): TROPONINT  Recent TSH No results for input(s): TSH in the last 24 hours.  Recent URIC ACID Invalid input(s): URIC  Recent BLOOD CULTURE No results for input(s): CXBLD in the last 24 hours.  Recent URINE CULTURE Invalid input(s): CXURN  Recent OCCULT BLOOD Invalid input(s): OCCBL  Recent PT/PTT No results for input(s): INR, PTT in the last 24 hours.    Invalid input(s): PTI, COUM, COUMP, ACOAG, ACOAP  Recent PT/INR No results for input(s): INR in the last 24 hours.    Invalid input(s): PTI, COUM, COUMP  Recent LIPID PANEL No results for input(s): CHOL, TRIG, HDL in the last 24 hours.    Invalid input(s): LDLC, VLDLC, LRAT  Recent ABG No results for input(s): TEMP, FIO2, RATE, MODE, ETCO2, PEEP in the last 24 hours.    Invalid input(s): APH, APCO2, APO2, AHCO3, ATCO2, ABE, AOSAT, ABGS, ALLEN, STATS, O2DEL, O2FLO, PRESS, VNTMN, PRSUP, TIVOL      Rads:   Radiological Procedure reviewed.    Signed by: Valerie Roys, MD, [facility]

## 2017-03-04 NOTE — Progress Notes (Signed)
1 unit pRBC transfused and completed. No reaction noted. VSS. Will continue to monitor

## 2017-03-04 NOTE — Plan of Care (Signed)
Problem: Moderate/High Fall Risk Score >5  Goal: Patient will remain free of falls  Outcome: Progressing   03/03/17 2245   OTHER   Moderate Risk (6-13) MOD-Initiate Yellow "Fall Risk" magnet communication tool;MOD-Remain with patient during toileting;MOD-Place Fall Risk level on whiteboard in room       Problem: Safety  Goal: Patient will be free from injury during hospitalization  Outcome: Progressing   03/04/17 0220   Goal/Interventions addressed this shift   Patient will be free from injury during hospitalization  Assess patient's risk for falls and implement fall prevention plan of care per policy;Provide and maintain safe environment;Hourly rounding;Assess for patients risk for elopement and implement Elopement Risk Plan per policy     Goal: Patient will be free from infection during hospitalization  Outcome: Progressing   03/04/17 0220   Goal/Interventions addressed this shift   Free from Infection during hospitalization Assess and monitor for signs and symptoms of infection;Monitor lab/diagnostic results       Problem: Pain  Goal: Pain at adequate level as identified by patient  Outcome: Completed Date Met: 03/04/17  Pt states he does not have pain    Problem: Side Effects from Pain Analgesia  Goal: Patient will experience minimal side effects of analgesic therapy  Outcome: Completed Date Met: 03/04/17  Not receiving pain medications    Problem: Discharge Barriers  Goal: Patient will be discharged home or other facility with appropriate resources  Outcome: Progressing   03/04/17 0220   Goal/Interventions addressed this shift   Discharge to home or other facility with appropriate resources Provide appropriate patient education       Problem: Psychosocial and Spiritual Needs  Goal: Demonstrates ability to cope with hospitalization/illness  Outcome: Progressing   03/04/17 0220   Goal/Interventions addressed this shift   Demonstrates ability to cope with hospitalizations/illness Provide quiet environment        Problem: Altered GI Function  Goal: Mobility/Activity is maintained at optimal level for patient  Outcome: Progressing   03/04/17 0220   Goal/Interventions addressed this shift   Mobility/activity is maintained at optimal level for patient Increase mobility as tolerated/progressive mobility;Encourage independent activity per ability     Goal: No bleeding  Outcome: Progressing   03/04/17 0220   Goal/Interventions addressed this shift   No bleeding  Monitor and assess vitals and hemodynamic parameters;Monitor/assess lab values and report abnormal values

## 2017-03-04 NOTE — Progress Notes (Signed)
PROGRESS NOTE    Date Time: 03/04/17 10:04 AM  Patient Name: [name],[name]      Assessment:   1.Anemia  2.CAD S/P  DES (12/5)  3.Afib  4.AVM  5.HTN  Plan:   -Pt is S/P 2 units of PRBC Tx  -Cont with IV Venofer 200 mg IV X 5  -Cont with B12 1000 mcg IM daily for 7 days  -Cont to hols Plavix  -Will cont to monitor CBC  Cont plan of care  All labs, notes and past 24 hrs events reviewed   Subjective:   Pt was seen at bedside, feels better today, no N/V/D, no HA or Blurry vision   Medications:     Current Facility-Administered Medications   Medication Dose Route Frequency   . atorvastatin  80 mg Oral QHS   . cyanocobalamin  1,000 mcg Intramuscular Q24H   . iron sucrose  200 mg Intravenous Q24H   . pantoprazole  40 mg Intravenous BID       Review of Systems:   A comprehensive review of systems was: History obtained from chart review and the patient  General ROS: negative for - chills, fatigue or fever  ENT ROS: negative for - epistaxis or headaches  Hematological and Lymphatic ROS: negative for - bleeding problems or night sweats  Respiratory ROS: no cough, shortness of breath, or wheezing  Cardiovascular ROS: no chest pain or dyspnea on exertion  Gastrointestinal ROS: no abdominal pain, change in bowel habits, or black or bloody stools  Genito-Urinary ROS: no dysuria, trouble voiding, or hematuria  Musculoskeletal ROS: negative for - gait disturbance or joint pain  Neurological ROS: no TIA or stroke symptoms  Dermatological ROS: negative for acne, dry skin, eczema and hair changes    Physical Exam:     Vitals:    03/04/17 0948   BP: 95/44   Pulse: 84   Resp: 16   Temp: 98.2 F (36.8 C)   SpO2:        Intake and Output Summary (Last 24 hours) at Date Time    Intake/Output Summary (Last 24 hours) at 03/04/17 1004  Last data filed at 03/03/17 2044   Gross per 24 hour   Intake              755 ml   Output              200 ml   Net              555 ml       General appearance: Elderly, well nourished, appears stated age and  in NAD  Eyes: Sclera anicteric, pink conjunctivae, no ptosis  ENMT: mucous membranes moist, nose and ears appear normal. Oropharynx clear.  Chest: Non labored respirations, no audible wheezing, no clubbing or cyanosis  CV: Regular rate and rhythm, no JVD, no LE edema  Abdomen: soft, non-tender, non-distended, no masses or organomegaly  Skin: Normal color and turgor, no rashes, no suspicious skin lesions noted  Neuro: CN II-XII grossly intact. No gross movement disorders noted.  Mental status: Appropriate affect, alert and oriented x 3    Labs:     Results     Procedure Component Value Units Date/Time    Prepare/Crossmatch Red Blood Cells:  Two Units [[id number]] Collected:  03/03/17 1301     Updated:  03/04/17 0938     RBC Leukoreduced RBC Leukoreduced     BLUNIT [id number]     Status selected  PRODUCT CODE (NON READABLE) [id number]     Expiration Date 811914782956     UTYPE O POS     RBC Leukoreduced RBC Leukoreduced     BLUNIT [id number]     Status issued     PRODUCT CODE (NON READABLE) E4533V00     Expiration Date [id number]     UTYPE O POS    PRU [[id number]]  (Abnormal) Collected:  03/04/17 0227     Updated:  03/04/17 0317     PRU 123 (L)    B-type Natriuretic Peptide [[id number]]  (Abnormal) Collected:  03/04/17 0227    Specimen:  Blood Updated:  03/04/17 0314     B-Natriuretic Peptide 1,745 (H) pg/mL     Troponin I [[id number]] Collected:  03/04/17 0227    Specimen:  Blood Updated:  03/04/17 0314     Troponin I 0.04 ng/mL     GFR [[id number]] Collected:  03/04/17 0227     Updated:  03/04/17 0307     EGFR 41.0    Comprehensive metabolic panel [[id number]]  (Abnormal) Collected:  03/04/17 0227    Specimen:  Blood Updated:  03/04/17 0307     Glucose 93 mg/dL      BUN 01.0 (H) mg/dL      Creatinine 1.6 (H) mg/dL      Sodium 932 mEq/L      Potassium 3.8 mEq/L      Chloride 108 mEq/L      CO2 18 (L) mEq/L      Calcium 8.3 mg/dL      Protein, Total 5.5 (L) g/dL      Albumin 2.7 (L) g/dL      AST (SGOT) 13 U/L       ALT 13 U/L      Alkaline Phosphatase 79 U/L      Bilirubin, Total 1.2 mg/dL      Globulin 2.8 g/dL      Albumin/Globulin Ratio 1.0    CBC and differential [[id number]]  (Abnormal) Collected:  03/04/17 0227    Specimen:  Blood from Blood Updated:  03/04/17 0255     WBC 7.68 x10 3/uL      Hgb 7.2 (L) g/dL      Hematocrit 54.2 (L) %      Platelets 465 (H) x10 3/uL      RBC 2.60 (L) x10 6/uL      MCV 89.6 fL      MCH 27.7 (L) pg      MCHC 30.9 (L) g/dL      RDW 18 (H) %      MPV 10.4 fL      Neutrophils 73.0 %      Lymphocytes Automated 6.9 %      Monocytes 18.1 %      Eosinophils Automated 1.2 %      Basophils Automated 0.3 %      Immature Granulocyte 0.5 %      Nucleated RBC 0.3 /100 WBC      Neutrophils Absolute 5.61 x10 3/uL      Abs Lymph Automated 0.53 x10 3/uL      Abs Mono Automated 1.39 (H) x10 3/uL      Abs Eos Automated 0.09 x10 3/uL      Absolute Baso Automated 0.02 x10 3/uL      Absolute Immature Granulocyte 0.04 x10 3/uL      Absolute NRBC 0.02 (H) x10 3/uL     Prepare/Crossmatch Red Blood Cells:  Two Units [[id number]] Collected:  03/03/17 1301     Updated:  03/04/17 0042     RBC Leukoreduced RBC Leukoreduced     BLUNIT [id number]     Status transfused     PRODUCT CODE (NON READABLE) E0226V00     Expiration Date [id number]     UTYPE O POS     RBC Leukoreduced RBC Leukoreduced     BLUNIT [id number]     Status transfused     PRODUCT CODE (NON READABLE) E0226V00     Expiration Date [id number]     UTYPE O POS    Troponin I [[id number]] Collected:  03/03/17 2306    Specimen:  Blood Updated:  03/03/17 2342     Troponin I 0.03 ng/mL     i-Stat CG4 Venous CartrIDge [[id number]] Collected:  03/03/17 1526     Updated:  03/03/17 1535     i-STAT pH Venous 7.423     i-STAT pCO2 Venous 32.2     i-STAT pO2 Venous 23.0     i-STAT HCO3 Bicarbonate Venous 21.0 mEq/L      i-STAT Total CO2 Venous 22.0 mEq/L      i-STAT Base Excess Venous -3.0 mEq/L      i-STAT O2 Saturation Venous 43.0 %      i-STAT Lactic acid 1.4  mmol/L      i-STAT Patient Temperature 98.2 F     i-STAT FIO2 21     i-STAT O2 Delivery Room Air     i-STAT Allen's Test NA     i-STAT ECMO No     i-STAT Draw Site Venous    Type and Screen [[id number]] Collected:  03/03/17 1301    Specimen:  Blood Updated:  03/03/17 1412     ABO Rh O POS     AB Screen Gel NEG    CBC with differential [[id number]]  (Abnormal) Collected:  03/03/17 1301    Specimen:  Blood from Blood Updated:  03/03/17 1400     WBC 10.34 x10 3/uL      Hgb 5.7 (LL) g/dL      Hematocrit 66.0 (L) %      Platelets 652 (H) x10 3/uL      RBC 2.18 (L) x10 6/uL      MCV 90.4 fL      MCH 26.1 (L) pg      MCHC 28.9 (L) g/dL      RDW 21 (H) %      MPV 10.4 fL      Neutrophils 78.2 %      Lymphocytes Automated 5.0 %      Monocytes 15.2 %      Eosinophils Automated 0.8 %      Basophils Automated 0.3 %      Immature Granulocyte 0.5 %      Nucleated RBC 0.2 /100 WBC      Neutrophils Absolute 8.09 x10 3/uL      Abs Lymph Automated 0.52 x10 3/uL      Abs Mono Automated 1.57 (H) x10 3/uL      Abs Eos Automated 0.08 x10 3/uL      Absolute Baso Automated 0.03 x10 3/uL      Absolute Immature Granulocyte 0.05 x10 3/uL      Absolute NRBC 0.02 (H) x10 3/uL     GFR [[id number]] Collected:  03/03/17 1301     Updated:  03/03/17 1340     EGFR 28.4    Comprehensive metabolic panel [[id number]]  (Abnormal) Collected:  03/03/17 1301  Specimen:  Blood Updated:  03/03/17 1340     Glucose 109 (H) mg/dL      BUN 16.1 (H) mg/dL      Creatinine 2.2 (H) mg/dL      Sodium 096 mEq/L      Potassium 4.2 mEq/L      Chloride 105 mEq/L      CO2 17 (L) mEq/L      Calcium 8.6 mg/dL      Protein, Total 6.1 g/dL      Albumin 3.0 (L) g/dL      AST (SGOT) 12 U/L      ALT 14 U/L      Alkaline Phosphatase 84 U/L      Bilirubin, Total 0.4 mg/dL      Globulin 3.1 g/dL      Albumin/Globulin Ratio 1.0    PT/APTT [[id number]]  (Abnormal) Collected:  03/03/17 1301     Updated:  03/03/17 1339     PT 15.2 (H) sec      PT INR 1.2 (H)     PT Anticoag. Given Within  48 hrs. None     PTT 29 sec               Rads:   Xr Chest  Ap Portable    Result Date: 03/03/2017   Interstitial prominence with bibasilar airspace disease and a probable small left pleural effusion. The findings could be related to edema in the appropriate clinical setting however infection cannot be excluded. Marius Ditch, MD 03/03/2017 1:06 PM    Radiological Procedure reviewed.    Signed by: Riyad Bellow

## 2017-03-04 NOTE — Progress Notes (Signed)
Medicine Shift Note    Patient Lines/Drains/Airways Status    Active Lines, Drains and Airways     Name:   Placement date:   Placement time:   Site:   Days:    Peripheral IV 03/03/17 Left Antecubital  03/03/17    1306    Antecubital    less than 1                Last BM: 12/13    Pending Orders: PRU lab test    Discharge Plan: Home when medically stable    POC: Pt updated    Skin: scattered bruising, slight blanchable redness to gluteal fold    Tele: yes- NSR    Activity: ambulatory with stand-by assist    Interpreter Needs: no, Used: no, Method: n/a, Waiver Need/present: n/a    Shift Note: Pt received 2nd unit of blood. Tolerated well. Awaiting lab results. Appears to be resting comfortably in bed. Pt denies any pain.

## 2017-03-04 NOTE — Plan of Care (Signed)
Problem: Moderate/High Fall Risk Score >5  Goal: Patient will remain free of falls   03/04/17 2000   OTHER   Moderate Risk (6-13) MOD-Apply bed exit alarm if patient is confused       Problem: Safety  Goal: Patient will be free from injury during hospitalization  Outcome: Progressing   03/04/17 2114   Goal/Interventions addressed this shift   Patient will be free from injury during hospitalization  Assess patient's risk for falls and implement fall prevention plan of care per policy;Provide and maintain safe environment;Use appropriate transfer methods;Ensure appropriate safety devices are available at the bedside;Include patient/ family/ care giver in decisions related to safety;Hourly rounding;Assess for patients risk for elopement and implement Elopement Risk Plan per policy;Provide alternative method of communication if needed (communication boards, writing)     Goal: Patient will be free from infection during hospitalization  Outcome: Progressing   03/04/17 2114   Goal/Interventions addressed this shift   Free from Infection during hospitalization Assess and monitor for signs and symptoms of infection;Monitor lab/diagnostic results       Problem: Discharge Barriers  Goal: Patient will be discharged home or other facility with appropriate resources  Outcome: Progressing   03/04/17 2114   Goal/Interventions addressed this shift   Discharge to home or other facility with appropriate resources Provide appropriate patient education       Problem: Psychosocial and Spiritual Needs  Goal: Demonstrates ability to cope with hospitalization/illness  Outcome: Progressing   03/04/17 2114   Goal/Interventions addressed this shift   Demonstrates ability to cope with hospitalizations/illness Encourage verbalization of feelings/concerns/expectations;Provide quiet environment;Assist patient to identify own strengths and abilities;Encourage patient to set small goals for self;Encourage participation in diversional  activity;Reinforce positive adaptation of new coping behaviors;Include patient/ patient care companion in decisions       Problem: Altered GI Function  Goal: Mobility/Activity is maintained at optimal level for patient  Outcome: Progressing   03/04/17 2114   Goal/Interventions addressed this shift   Mobility/activity is maintained at optimal level for patient Increase mobility as tolerated/progressive mobility;Encourage independent activity per ability;Perform active/passive ROM;Maintain proper body alignment;Plan activities to conserve energy, plan rest periods;Reposition patient every 2 hours and as needed unless able to reposition self;Assess for changes in respiratory status, level of consciousness and/or development of fatigue;Consult/collaborate with Physical Therapy and/or Occupational Therapy     Goal: No bleeding  Outcome: Progressing   03/04/17 2114   Goal/Interventions addressed this shift   No bleeding  Monitor and assess vitals and hemodynamic parameters

## 2017-03-04 NOTE — Progress Notes (Signed)
Writer s/w pt. And son. After assessment son is requesting information on private aide resources to help with minor needs at home. Writer has provided family with Non Skilled Personal Care agency list. Son is appreciative and states "I will look into these places and see what is most appropriate."       03/04/17 1138   Patient Type   Within 30 Days of Previous Admission? Yes   Healthcare Decisions   Interviewed: Patient;Family   Interviewee Contact Information: [address] [place], [place] [address] Phone # [phone]   Orientation/Decision Making Abilities of Patient Alert and Oriented x3, able to make decisions   Advance Directive Patient does not have advance directive   Healthcare Agent Appointed Yes   Healthcare Agent's Name Aadhav Uhlig (daughter)   Healthcare Agent's Phone Number [phone]   Additional Emergency Contacts? N/A   Prior to admission   Prior level of function Independent with ADLs   Type of Residence Private residence   Home Layout Multi-level;Other  (patient lives in 3 level home. One half step into the door. To get from one level to the next 14 steps which pt. does go up and down)   Have running water, electricity, heat, etc? Yes   Living Arrangements Alone   How do you get to your MD appointments? p.t gets help from daughter   How do you get your groceries? pt. daughter helps with groceries   Who fixes your meals? pt. can fix own meals   Who does your laundry? pt. can do own laundry   Who picks up your prescriptions? pt. can get prescriptions or daughters   Dressing Independent   Grooming Independent   Feeding Independent   Bathing Independent   Toileting Independent   DME Currently at Home Other (Comment)  (none)   Name of Prior Assisted Living Facility n/a   Home Care/Community Services None   Prior SNF admission? (Detail) n/a   Prior Rehab admission? (Detail) n/a   Adult Protective Services (APS) involved? No   Discharge Planning   Support Systems Children;Family members   Patient  expects to be discharged to: Home   Anticipated [place] plan discussed with: Same as interviewed   [place] discussion contact information: same as above   Potential barriers to discharge: Other  (Possible need for private aide at home upon D/C)   Mode of transportation: Private car (family member)   Consults/Providers   PT Evaluation Needed 2   OT Evalulation Needed 2   SLP Evaluation Needed 2   Outcome Palliative Care Screen Screened but did not meet criteria for intervention   Correct PCP listed in Epic? Yes   Important Message from [facility] Notice   Patient received 1st IMM Letter? Yes   Date of most recent IMM given: 03/03/17     Jaci Carrel. Aimie Wagman RN BSN   Case Management Department  RN Case Manager I  [facility]  [phone]

## 2017-03-04 NOTE — Progress Notes (Signed)
Medicine Shift Note    Patient Lines/Drains/Airways Status    Active Lines, Drains and Airways     None                Last BM: 03/02/17    Pending Orders: PRU 123 on 03/04/17    Discharge Plan: Hold Aspirin and Plavix for now until reordered, keep Hemoglobin above 9    ZOX:[id number]    Skin: No wounds    Tele:Yes, Afib on tele    Activity: bed mobility    Interpreter Needs: No Used: No, Method: N/AWaiver Need/present: N/A    Shift Note: Patient s/p blod transfusion of 2 units RBC's, no transfusion reaction noted, denies pain, no bleeding noted, Son was at bedside, monitoring in progress.

## 2017-03-04 NOTE — Progress Notes (Signed)
PROGRESS NOTE    Date Time: 03/04/17 7:53 AM  Patient Name: [name]  Attending Physician: Schuyler Amor, MD    Assessment:   Acute anemia due to acute blood loss from GIB  Recent Hx AVM on endoscopy   Severe CAD s/p PCI with DES 02/23/17  Baseline low BP   Plan:   S/p 2 units PRBC with improvement of h/h with hx CAD will give additional 2 units today   Hold plavix and aspirin cardiology following   GI following   D/w patient and his son in ED and ED physician   History of Present Illness:   [name] is a 83 y.o. male who presents to the hospital with abnormally low h/h in out patient labs   He noticed pasty maroon color stools for th epast few days   No cp, SOB, nausea vomiting  or abdominal pain   RES is negative for dizziness or lightheadedness   No hematamasis   Past Medical History:     Past Medical History:   Diagnosis Date   . Anemia     iron low   . DOE (dyspnea on exertion)     mild   . Glaucoma NEC    . Hypertensive disorder    . Lupus 1990   . Skin cancer     2006 removed mole   . Ulcerative colitis, chronic    . Vision abnormalities        Past Surgical History:     Past Surgical History:   Procedure Laterality Date   . CARDIAC SURGERY      3 stents   . EGD, COLONOSCOPY  04/03/2012    Procedure: EGD, COLONOSCOPY;  Surgeon: Lestine Mount, MD;  Location: [facility];  Service: Gastroenterology;  Laterality: N/A;   . EGD, COLONOSCOPY N/A 02/11/2017    Procedure: EGD, COLONOSCOPY;  Surgeon: Lestine Mount, MD;  Location: [facility];  Service: Gastroenterology;  Laterality: N/A;   . EYE SURGERY      cataract       Family History:   History reviewed. No pertinent family history.    Social History:     Social History     Social History   . Marital status: Married     Spouse name: N/A   . Number of children: N/A   . Years of education: N/A     Social History Main Topics   . Smoking status: Former Smoker     Packs/day: 1.00     Years: 12.00     Types: Cigarettes     Quit date: 12/18/1961   . Smokeless  tobacco: Never Used   . Alcohol use 0.0 oz/week      Comment: 0.5oz/d   . Drug use: No   . Sexual activity: Not on file     Other Topics Concern   . Not on file     Social History Narrative   . No narrative on file       Allergies:   No Known Allergies    Medications:     Prescriptions Prior to Admission   Medication Sig   . FUROSEMIDE PO Take by mouth.   Marland Kitchen aspirin 81 MG chewable tablet Chew 1 tablet (81 mg total) by mouth daily.   Marland Kitchen atorvastatin (LIPITOR) 80 MG tablet Take 1 tablet (80 mg total) by mouth nightly.   . carvedilol (COREG) 3.125 MG tablet Take 1 tablet (3.125 mg total) by mouth every 12 (twelve)  hours.   . clopidogrel (PLAVIX) 75 mg tablet Take 1 tablet (75 mg total) by mouth daily.   . IRON PO Take 200 mg by mouth daily.   Marland Kitchen lisinopril (PRINIVIL,ZESTRIL) 10 MG tablet Take 1 tablet (10 mg total) by mouth every 12 (twelve) hours.   . pantoprazole (PROTONIX) 40 MG tablet Take 1 tablet (40 mg total) by mouth 2 (two) times daily.       Review of Systems:   A comprehensive review of systems was: per HPI     Physical Exam:     Vitals:    03/04/17 0739   BP: 100/46   Pulse: 74   Resp: 16   Temp: 98.4 F (36.9 C)   SpO2: 96%       Intake and Output Summary (Last 24 hours) at Date Time    Intake/Output Summary (Last 24 hours) at 03/04/17 0753  Last data filed at 03/03/17 2044   Gross per 24 hour   Intake              755 ml   Output              200 ml   Net              555 ml       NAD   ALERT AND ORIENT X3  EOMI, PEERLA , PALE SCLERA  S1, S2, CTA BILATERALLY   NEURO INTACT CRANIAL NERVES  NO PEDAL EDEMA   NO BONE DEFORMITY     Labs:     Results     Procedure Component Value Units Date/Time    PRU [[id number]]  (Abnormal) Collected:  03/04/17 0227     Updated:  03/04/17 0317     PRU 123 (L)    B-type Natriuretic Peptide [[id number]]  (Abnormal) Collected:  03/04/17 0227    Specimen:  Blood Updated:  03/04/17 0314     B-Natriuretic Peptide 1,745 (H) pg/mL     Troponin I [[id number]] Collected:  03/04/17 0227     Specimen:  Blood Updated:  03/04/17 0314     Troponin I 0.04 ng/mL     GFR [[id number]] Collected:  03/04/17 0227     Updated:  03/04/17 0307     EGFR 41.0    Comprehensive metabolic panel [[id number]]  (Abnormal) Collected:  03/04/17 0227    Specimen:  Blood Updated:  03/04/17 0307     Glucose 93 mg/dL      BUN 24.4 (H) mg/dL      Creatinine 1.6 (H) mg/dL      Sodium 010 mEq/L      Potassium 3.8 mEq/L      Chloride 108 mEq/L      CO2 18 (L) mEq/L      Calcium 8.3 mg/dL      Protein, Total 5.5 (L) g/dL      Albumin 2.7 (L) g/dL      AST (SGOT) 13 U/L      ALT 13 U/L      Alkaline Phosphatase 79 U/L      Bilirubin, Total 1.2 mg/dL      Globulin 2.8 g/dL      Albumin/Globulin Ratio 1.0    CBC and differential [[id number]]  (Abnormal) Collected:  03/04/17 0227    Specimen:  Blood from Blood Updated:  03/04/17 0255     WBC 7.68 x10 3/uL      Hgb 7.2 (L) g/dL      Hematocrit 03.4 (L) %  Platelets 465 (H) x10 3/uL      RBC 2.60 (L) x10 6/uL      MCV 89.6 fL      MCH 27.7 (L) pg      MCHC 30.9 (L) g/dL      RDW 18 (H) %      MPV 10.4 fL      Neutrophils 73.0 %      Lymphocytes Automated 6.9 %      Monocytes 18.1 %      Eosinophils Automated 1.2 %      Basophils Automated 0.3 %      Immature Granulocyte 0.5 %      Nucleated RBC 0.3 /100 WBC      Neutrophils Absolute 5.61 x10 3/uL      Abs Lymph Automated 0.53 x10 3/uL      Abs Mono Automated 1.39 (H) x10 3/uL      Abs Eos Automated 0.09 x10 3/uL      Absolute Baso Automated 0.02 x10 3/uL      Absolute Immature Granulocyte 0.04 x10 3/uL      Absolute NRBC 0.02 (H) x10 3/uL     Prepare/Crossmatch Red Blood Cells:  Two Units [[id number]] Collected:  03/03/17 1301     Updated:  03/04/17 0042     RBC Leukoreduced RBC Leukoreduced     BLUNIT [id number]     Status transfused     PRODUCT CODE (NON READABLE) E0226V00     Expiration Date [id number]     UTYPE O POS     RBC Leukoreduced RBC Leukoreduced     BLUNIT [id number]     Status transfused     PRODUCT CODE (NON READABLE)  E0226V00     Expiration Date [id number]     [place] POS    Troponin I [[id number]] Collected:  03/03/17 2306    Specimen:  Blood Updated:  03/03/17 2342     Troponin I 0.03 ng/mL     i-Stat CG4 Venous CartrIDge [[id number]] Collected:  03/03/17 1526     Updated:  03/03/17 1535     i-STAT pH Venous 7.423     i-STAT pCO2 Venous 32.2     i-STAT pO2 Venous 23.0     i-STAT HCO3 Bicarbonate Venous 21.0 mEq/L      i-STAT Total CO2 Venous 22.0 mEq/L      i-STAT Base Excess Venous -3.0 mEq/L      i-STAT O2 Saturation Venous 43.0 %      i-STAT Lactic acid 1.4 mmol/L      i-STAT Patient Temperature 98.2 F     i-STAT FIO2 21     i-STAT O2 Delivery Room Air     i-STAT Allen's Test NA     i-STAT ECMO No     i-STAT Draw Site Venous    Type and Screen [[id number]] Collected:  03/03/17 1301    Specimen:  Blood Updated:  03/03/17 1412     ABO Rh O POS     AB Screen Gel NEG    CBC with differential [[id number]]  (Abnormal) Collected:  03/03/17 1301    Specimen:  Blood from Blood Updated:  03/03/17 1400     WBC 10.34 x10 3/uL      Hgb 5.7 (LL) g/dL      Hematocrit 32.2 (L) %      Platelets 652 (H) x10 3/uL      RBC 2.18 (L) x10 6/uL      MCV 90.4 fL  MCH 26.1 (L) pg      MCHC 28.9 (L) g/dL      RDW 21 (H) %      MPV 10.4 fL      Neutrophils 78.2 %      Lymphocytes Automated 5.0 %      Monocytes 15.2 %      Eosinophils Automated 0.8 %      Basophils Automated 0.3 %      Immature Granulocyte 0.5 %      Nucleated RBC 0.2 /100 WBC      Neutrophils Absolute 8.09 x10 3/uL      Abs Lymph Automated 0.52 x10 3/uL      Abs Mono Automated 1.57 (H) x10 3/uL      Abs Eos Automated 0.08 x10 3/uL      Absolute Baso Automated 0.03 x10 3/uL      Absolute Immature Granulocyte 0.05 x10 3/uL      Absolute NRBC 0.02 (H) x10 3/uL     GFR [[id number]] Collected:  03/03/17 1301     Updated:  03/03/17 1340     EGFR 28.4    Comprehensive metabolic panel [[id number]]  (Abnormal) Collected:  03/03/17 1301    Specimen:  Blood Updated:  03/03/17 1340     Glucose  109 (H) mg/dL      BUN 86.5 (H) mg/dL      Creatinine 2.2 (H) mg/dL      Sodium 784 mEq/L      Potassium 4.2 mEq/L      Chloride 105 mEq/L      CO2 17 (L) mEq/L      Calcium 8.6 mg/dL      Protein, Total 6.1 g/dL      Albumin 3.0 (L) g/dL      AST (SGOT) 12 U/L      ALT 14 U/L      Alkaline Phosphatase 84 U/L      Bilirubin, Total 0.4 mg/dL      Globulin 3.1 g/dL      Albumin/Globulin Ratio 1.0    PT/APTT [[id number]]  (Abnormal) Collected:  03/03/17 1301     Updated:  03/03/17 1339     PT 15.2 (H) sec      PT INR 1.2 (H)     PT Anticoag. Given Within 48 hrs. None     PTT 29 sec               Rads:   Radiological Procedure reviewed.    Signed by: Schuyler Amor MD  [phone]

## 2017-03-05 LAB — CBC AND DIFFERENTIAL
Absolute NRBC: 0.03 10*3/uL — ABNORMAL HIGH
Basophils Absolute Automated: 0.02 10*3/uL (ref 0.00–0.20)
Basophils Automated: 0.3 %
Eosinophils Absolute Automated: 0.28 10*3/uL (ref 0.00–0.70)
Eosinophils Automated: 3.7 %
Hematocrit: 30.1 % — ABNORMAL LOW (ref 42.0–52.0)
Hgb: 9.6 g/dL — ABNORMAL LOW (ref 13.0–17.0)
Immature Granulocytes Absolute: 0.05 10*3/uL
Immature Granulocytes: 0.7 %
Lymphocytes Absolute Automated: 0.62 10*3/uL (ref 0.50–4.40)
Lymphocytes Automated: 8.3 %
MCH: 28.3 pg (ref 28.0–32.0)
MCHC: 31.9 g/dL — ABNORMAL LOW (ref 32.0–36.0)
MCV: 88.8 fL (ref 80.0–100.0)
MPV: 10.2 fL (ref 9.4–12.3)
Monocytes Absolute Automated: 1.3 10*3/uL — ABNORMAL HIGH (ref 0.00–1.20)
Monocytes: 17.4 %
Neutrophils Absolute: 5.21 10*3/uL (ref 1.80–8.10)
Neutrophils: 69.6 %
Nucleated RBC: 0.4 /100 WBC (ref 0.0–1.0)
Platelets: 425 10*3/uL — ABNORMAL HIGH (ref 140–400)
RBC: 3.39 10*6/uL — ABNORMAL LOW (ref 4.70–6.00)
RDW: 17 % — ABNORMAL HIGH (ref 12–15)
WBC: 7.48 10*3/uL (ref 3.50–10.80)

## 2017-03-05 LAB — COMPREHENSIVE METABOLIC PANEL
ALT: 12 U/L (ref 0–55)
AST (SGOT): 17 U/L (ref 5–34)
Albumin/Globulin Ratio: 0.9 (ref 0.9–2.2)
Albumin: 2.5 g/dL — ABNORMAL LOW (ref 3.5–5.0)
Alkaline Phosphatase: 78 U/L (ref 38–106)
BUN: 33 mg/dL — ABNORMAL HIGH (ref 9.0–28.0)
Bilirubin, Total: 0.5 mg/dL (ref 0.2–1.2)
CO2: 17 mEq/L — ABNORMAL LOW (ref 22–29)
Calcium: 8.1 mg/dL (ref 7.9–10.2)
Chloride: 108 mEq/L (ref 100–111)
Creatinine: 1.3 mg/dL (ref 0.7–1.3)
Globulin: 2.8 g/dL (ref 2.0–3.6)
Glucose: 81 mg/dL (ref 70–100)
Potassium: 3.9 mEq/L (ref 3.5–5.1)
Protein, Total: 5.3 g/dL — ABNORMAL LOW (ref 6.0–8.3)
Sodium: 136 mEq/L (ref 136–145)

## 2017-03-05 LAB — HEMOGLOBIN AND HEMATOCRIT, BLOOD
Hematocrit: 32.7 % — ABNORMAL LOW (ref 42.0–52.0)
Hgb: 10.3 g/dL — ABNORMAL LOW (ref 13.0–17.0)

## 2017-03-05 LAB — GFR: EGFR: 52.1

## 2017-03-05 LAB — TROPONIN I: Troponin I: 0.04 ng/mL (ref 0.00–0.09)

## 2017-03-05 MED ORDER — DOCUSATE SODIUM 100 MG PO CAPS
100.00 mg | ORAL_CAPSULE | Freq: Two times a day (BID) | ORAL | Status: DC | PRN
Start: 2017-03-05 — End: 2017-03-07

## 2017-03-05 MED ORDER — BISACODYL 10 MG RE SUPP
10.00 mg | Freq: Every day | RECTAL | Status: DC | PRN
Start: 2017-03-05 — End: 2017-03-07

## 2017-03-05 MED ORDER — CARVEDILOL 3.125 MG PO TABS
3.1250 mg | ORAL_TABLET | Freq: Two times a day (BID) | ORAL | Status: DC
Start: 2017-03-05 — End: 2017-03-07
  Administered 2017-03-05 – 2017-03-07 (×4): 3.125 mg via ORAL
  Filled 2017-03-05 (×5): qty 1

## 2017-03-05 MED ORDER — POLYETHYLENE GLYCOL 3350 17 G PO PACK
17.00 g | PACK | Freq: Every day | ORAL | Status: DC | PRN
Start: 2017-03-05 — End: 2017-03-07
  Administered 2017-03-05: 13:00:00 17 g via ORAL
  Filled 2017-03-05: qty 1

## 2017-03-05 MED ORDER — CLOPIDOGREL BISULFATE 75 MG PO TABS
75.00 mg | ORAL_TABLET | Freq: Every day | ORAL | Status: DC
Start: 2017-03-05 — End: 2017-03-07
  Administered 2017-03-05 – 2017-03-07 (×3): 75 mg via ORAL
  Filled 2017-03-05 (×3): qty 1

## 2017-03-05 NOTE — Plan of Care (Signed)
Problem: Moderate/High Fall Risk Score >5  Goal: Patient will remain free of falls  Outcome: Progressing   03/05/17 2000   OTHER   Moderate Risk (6-13) MOD-Apply bed exit alarm if patient is confused       Problem: Safety  Goal: Patient will be free from injury during hospitalization  Outcome: Progressing   03/05/17 2009   Goal/Interventions addressed this shift   Patient will be free from injury during hospitalization  Assess patient's risk for falls and implement fall prevention plan of care per policy;Provide and maintain safe environment;Use appropriate transfer methods;Provide alternative method of communication if needed ([employer], writing);Assess for patients risk for elopement and implement Elopement Risk Plan per policy;Hourly rounding;Include patient/ family/ care giver in decisions related to safety;Ensure appropriate safety devices are available at the bedside     Goal: Patient will be free from infection during hospitalization  Outcome: Progressing   03/05/17 2009   Goal/Interventions addressed this shift   Free from Infection during hospitalization Assess and monitor for signs and symptoms of infection;Monitor lab/diagnostic results       Problem: Discharge Barriers  Goal: Patient will be discharged home or other facility with appropriate resources  Outcome: Progressing   03/05/17 2009   Goal/Interventions addressed this shift   Discharge to home or other facility with appropriate resources Provide appropriate patient education       Problem: Psychosocial and Spiritual Needs  Goal: Demonstrates ability to cope with hospitalization/illness  Outcome: Progressing   03/05/17 2009   Goal/Interventions addressed this shift   Demonstrates ability to cope with hospitalizations/illness Encourage verbalization of feelings/concerns/expectations;Provide quiet environment;Assist patient to identify own strengths and abilities;Encourage patient to set small goals for self;Encourage participation in  diversional activity;Reinforce positive adaptation of new coping behaviors;Include patient/ patient care companion in decisions       Problem: Altered GI Function  Goal: Mobility/Activity is maintained at optimal level for patient  Outcome: Progressing   03/05/17 2009   Goal/Interventions addressed this shift   Mobility/activity is maintained at optimal level for patient Increase mobility as tolerated/progressive mobility;Encourage independent activity per ability;Perform active/passive ROM;Maintain proper body alignment;Plan activities to conserve energy, plan rest periods;Reposition patient every 2 hours and as needed unless able to reposition self;Assess for changes in respiratory status, level of consciousness and/or development of fatigue;Consult/collaborate with Physical Therapy and/or Occupational Therapy     Goal: No bleeding  Outcome: Progressing   03/05/17 2009   Goal/Interventions addressed this shift   No bleeding  Monitor and assess vitals and hemodynamic parameters;Monitor/assess lab values and report abnormal values

## 2017-03-05 NOTE — Plan of Care (Signed)
Problem: Moderate/High Fall Risk Score >5  Goal: Patient will remain free of falls  Outcome: Progressing   03/05/17 0845   OTHER   Moderate Risk (6-13) MOD-Place Fall Risk level on whiteboard in room;MOD-include family in multidisciplinary POC discussions;MOD-Utilize diversion activities;MOD-Use of assistive devices-bedside commode if appropriate;MOD-Remain with patient during toileting;MOD-Consider activation of bed alarm if appropriate;MOD-Initiate Yellow "Fall Risk" magnet communication tool       Problem: Safety  Goal: Patient will be free from injury during hospitalization  Outcome: Progressing   03/05/17 0909   Goal/Interventions addressed this shift   Patient will be free from injury during hospitalization  Assess patient's risk for falls and implement fall prevention plan of care per policy;Provide and maintain safe environment;Use appropriate transfer methods;Ensure appropriate safety devices are available at the bedside;Include patient/ family/ care giver in decisions related to safety;Hourly rounding;Assess for patients risk for elopement and implement Elopement Risk Plan per policy;Provide alternative method of communication if needed (communication boards, writing)     Goal: Patient will be free from infection during hospitalization  Outcome: Progressing   03/05/17 0909   Goal/Interventions addressed this shift   Free from Infection during hospitalization Assess and monitor for signs and symptoms of infection;Monitor lab/diagnostic results;Monitor all insertion sites (i.e. indwelling lines, tubes, urinary catheters, and drains)       Problem: Discharge Barriers  Goal: Patient will be discharged home or other facility with appropriate resources  Outcome: Progressing   03/05/17 0909   Goal/Interventions addressed this shift   Discharge to home or other facility with appropriate resources Provide appropriate patient education;Provide information on available health resources       Problem: Psychosocial and  Spiritual Needs  Goal: Demonstrates ability to cope with hospitalization/illness  Outcome: Progressing   03/05/17 0909   Goal/Interventions addressed this shift   Demonstrates ability to cope with hospitalizations/illness Encourage verbalization of feelings/concerns/expectations;Provide quiet environment;Encourage participation in diversional activity;Include patient/ patient care companion in decisions       Problem: Altered GI Function  Goal: Mobility/Activity is maintained at optimal level for patient  Outcome: Progressing   03/05/17 0909   Goal/Interventions addressed this shift   Mobility/activity is maintained at optimal level for patient Increase mobility as tolerated/progressive mobility;Encourage independent activity per ability;Reposition patient every 2 hours and as needed unless able to reposition self;Assess for changes in respiratory status, level of consciousness and/or development of fatigue;Plan activities to conserve energy, plan rest periods     Goal: No bleeding  Outcome: Progressing   03/05/17 0909   Goal/Interventions addressed this shift   No bleeding  Monitor and assess vitals and hemodynamic parameters;Monitor/assess lab values and report abnormal values;Assess for bruising/petechia

## 2017-03-05 NOTE — Progress Notes (Signed)
PROGRESS NOTE    Date Time: 03/05/17 12:13 PM  Patient Name: [name],[name]  Attending Physician: Schuyler Amor, MD    Assessment:   Acute anemia due to acute blood loss from GIB  Recent Hx AVM on endoscopy   Severe CAD s/p PCI with DES 02/23/17  Baseline low BP   Plan:   H/h improved post transfusion   Monitor labs  Plavix started   Stool softeners   PT and OT   D/w patient and his son in ED and ED physician   History of Present Illness:   [name] is a 83 y.o. male who presents to the hospital with abnormally low h/h in out patient labs   He noticed pasty maroon color stools for th epast few days   No cp, SOB, nausea vomiting  or abdominal pain   RES is negative for dizziness or lightheadedness   No hematamasis   Past Medical History:     Past Medical History:   Diagnosis Date   . Anemia     iron low   . DOE (dyspnea on exertion)     mild   . Glaucoma NEC    . Hypertensive disorder    . Lupus 1990   . Skin cancer     2006 removed mole   . Ulcerative colitis, chronic    . Vision abnormalities        Past Surgical History:     Past Surgical History:   Procedure Laterality Date   . CARDIAC SURGERY      3 stents   . EGD, COLONOSCOPY  04/03/2012    Procedure: EGD, COLONOSCOPY;  Surgeon: Lestine Mount, MD;  Location: [facility];  Service: Gastroenterology;  Laterality: N/A;   . EGD, COLONOSCOPY N/A 02/11/2017    Procedure: EGD, COLONOSCOPY;  Surgeon: Lestine Mount, MD;  Location: [facility];  Service: Gastroenterology;  Laterality: N/A;   . EYE SURGERY      cataract       Family History:   History reviewed. No pertinent family history.    Social History:     Social History     Social History   . Marital status: Married     Spouse name: N/A   . Number of children: N/A   . Years of education: N/A     Social History Main Topics   . Smoking status: Former Smoker     Packs/day: 1.00     Years: 12.00     Types: Cigarettes     Quit date: 12/18/1961   . Smokeless tobacco: Never Used   . Alcohol use 0.0 oz/week       Comment: 0.5oz/d   . Drug use: No   . Sexual activity: Not on file     Other Topics Concern   . Not on file     Social History Narrative   . No narrative on file       Allergies:   No Known Allergies    Medications:     Prescriptions Prior to Admission   Medication Sig   . FUROSEMIDE PO Take by mouth.   Marland Kitchen aspirin 81 MG chewable tablet Chew 1 tablet (81 mg total) by mouth daily.   Marland Kitchen atorvastatin (LIPITOR) 80 MG tablet Take 1 tablet (80 mg total) by mouth nightly.   . carvedilol (COREG) 3.125 MG tablet Take 1 tablet (3.125 mg total) by mouth every 12 (twelve) hours.   . clopidogrel (PLAVIX) 75 mg tablet  Take 1 tablet (75 mg total) by mouth daily.   . IRON PO Take 200 mg by mouth daily.   Marland Kitchen lisinopril (PRINIVIL,ZESTRIL) 10 MG tablet Take 1 tablet (10 mg total) by mouth every 12 (twelve) hours.   . pantoprazole (PROTONIX) 40 MG tablet Take 1 tablet (40 mg total) by mouth 2 (two) times daily.       Review of Systems:   A comprehensive review of systems was: per HPI     Physical Exam:     Vitals:    03/05/17 0845   BP: 123/68   Pulse: 75   Resp:    Temp:    SpO2:        Intake and Output Summary (Last 24 hours) at Date Time    Intake/Output Summary (Last 24 hours) at 03/05/17 1213  Last data filed at 03/04/17 2343   Gross per 24 hour   Intake              368 ml   Output              725 ml   Net             -357 ml       NAD   ALERT AND ORIENT X3  EOMI, PEERLA , PALE SCLERA  S1, S2, CTA BILATERALLY   NEURO INTACT CRANIAL NERVES  NO PEDAL EDEMA   NO BONE DEFORMITY     Labs:     Results     Procedure Component Value Units Date/Time    Comprehensive metabolic panel [[id number]]  (Abnormal) Collected:  03/05/17 0357    Specimen:  Blood Updated:  03/05/17 0621     Glucose 81 mg/dL      BUN 40.9 (H) mg/dL      Creatinine 1.3 mg/dL      Sodium 811 mEq/L      Potassium 3.9 mEq/L      Chloride 108 mEq/L      CO2 17 (L) mEq/L      Calcium 8.1 mg/dL      Protein, Total 5.3 (L) g/dL      Albumin 2.5 (L) g/dL      AST (SGOT) 17 U/L       ALT 12 U/L      Alkaline Phosphatase 78 U/L      Bilirubin, Total 0.5 mg/dL      Globulin 2.8 g/dL      Albumin/Globulin Ratio 0.9    GFR [[id number]] Collected:  03/05/17 0357     Updated:  03/05/17 0621     EGFR 52.1    CBC and differential [[id number]]  (Abnormal) Collected:  03/05/17 0357    Specimen:  Blood from Blood Updated:  03/05/17 0614     WBC 7.48 x10 3/uL      Hgb 9.6 (L) g/dL      Hematocrit 46.9 (L) %      Platelets 425 (H) x10 3/uL      RBC 3.39 (L) x10 6/uL      MCV 88.8 fL      MCH 28.3 pg      MCHC 31.9 (L) g/dL      RDW 17 (H) %      MPV 10.2 fL      Neutrophils 69.6 %      Lymphocytes Automated 8.3 %      Monocytes 17.4 %      Eosinophils Automated 3.7 %  Basophils Automated 0.3 %      Immature Granulocyte 0.7 %      Nucleated RBC 0.4 /100 WBC      Neutrophils Absolute 5.21 x10 3/uL      Abs Lymph Automated 0.62 x10 3/uL      Abs Mono Automated 1.30 (H) x10 3/uL      Abs Eos Automated 0.28 x10 3/uL      Absolute Baso Automated 0.02 x10 3/uL      Absolute Immature Granulocyte 0.05 x10 3/uL      Absolute NRBC 0.03 (H) x10 3/uL     Troponin I [[id number]] Collected:  03/05/17 0357    Specimen:  Blood Updated:  03/05/17 0613     Troponin I 0.04 ng/mL     Prepare/Crossmatch Red Blood Cells:  Two Units [[id number]] Collected:  03/03/17 1301     Updated:  03/05/17 0042     RBC Leukoreduced RBC Leukoreduced     BLUNIT [id number]     Status transfused     PRODUCT CODE (NON READABLE) E4532V00     Expiration Date [id number]     UTYPE O POS     RBC Leukoreduced RBC Leukoreduced     BLUNIT [id number]     Status transfused     PRODUCT CODE (NON READABLE) E4533V00     Expiration Date [id number]     UTYPE O POS    Hemoglobin and hematocrit, blood [[id number]]  (Abnormal) Collected:  03/04/17 1755    Specimen:  Blood Updated:  03/04/17 1822     Hgb 10.2 (L) g/dL      Hematocrit 32.9 (L) %               Rads:   Radiological Procedure reviewed.    Signed by: Schuyler Amor MD  [phone]

## 2017-03-05 NOTE — Progress Notes (Signed)
Medicine Shift Note    Patient Lines/Drains/Airways Status    Active Lines, Drains and Airways     Name:   Placement date:   Placement time:   Site:   Days:    Peripheral IV 03/04/17 Right Forearm  03/04/17    0230    Forearm    1                Last BM 03-02-17    Pending Orders: no pending test    Discharge Plan:Discharge planning in progress by doctor. Pt will possibly go home on medications at discharge.    BJY:[facility] , medications and PRU123 testing    Skin:no changes    Tele:Telemetry monitor in place    Activity:oob with assist.    Interpreter Needs: no Used: no Method: no Waiver Need/present: no    Shift Note:the pt sleeping well most of this shift. VSS. No respiratory distress. Denied pain and nausea. No vomiting. Call light within reach

## 2017-03-05 NOTE — Progress Notes (Signed)
Medicine Shift Note    Patient Lines/Drains/Airways Status    Active Lines, Drains and Airways     Name:   Placement date:   Placement time:   Site:   Days:    Peripheral IV 03/04/17 Right Forearm  03/04/17    0230    Forearm    1                Last BM: 12/15    Pending Orders: am labs    Discharge Plan: TBD    POC: son at bedside    Skin: red sacrum/scattered bruising     Tele: yes - hx of afib    Activity: OOB with standby assist    Interpreter Needs: No  Shift Note: Pt asking for stool softener this morning. MD added PRN stool softeners. Patient chose miralax and had "3-4" BM's today. Pt states he "feels better" & "doesn't have that full feeling". BM's still dark.

## 2017-03-05 NOTE — Progress Notes (Signed)
[facility]   [name] M. Ethan Muss, MD, [facility]  PROGRESS NOTE  Office: [phone]  Cell phone [phone]                                       Date Time: 03/05/17 6:36 AM  Patient Name: [name],[name]      Assessment:    Acute GI bleed- no apparent active bleeding today.    Severe symptomatic anemia- improving after transfusion   Acute kidney injury- improving.    Hypotension, resolved.   Tachycardia, resolved.   Two-vessel obstructive CAD s/p PCI/DES to LM/LCx on 02/22/17.    Ventricular tachycardia, non-sustained, recorded on tele on 02/10/17 at 12:07   Atrial fibrillation, paroxysmal. Patient with high risk of rebleed with use of anticoagulation given multiple AVMs (stomach, duodenum and cecum) noted on endoscopic procedures.   CHF with reduced EF of 36%   Severe pulmonary hypertension with an estimated PASP of 63 mmHg by echo   Dyslipidemia   Plan:    Keep off Aspirin   Restart Plavix at 75   PRBC transfusion as needed to keep Hb>9   Lipitor 80 mg qhs   Resume Coreg 3.125 mg bid    Keep off Lisinopril 10 mg bid for acute kidney injury  Recent Cardiac Work up:   EKG 03/03/17:   SR, IRBBB, PACs, NS T wave abnormality, prolonged QT  Echo 02/11/17:   The left ventricle is normal in size.  * Left ventricular systolic function is moderately decreased with an  ejection fraction by Biplane Method of Discs of 36 %.  * Left Ventricular diastolic filling parameters are consistent with Grade II  diastolic dysfunction (pseudonormal pattern).  * Right ventricular size and systolic function are normal.  * Mild bi-atrial enlargement.  * Moderate to severe mitral valve regurgitation.  * There is mild tricuspid regurgitation.  * Moderate pulmonary hypertension with estimated right ventricular systolic  pressure of 63.29 mmHg.  Coronary Angiography 02/18/17:   LM: 100% occluded distally.    LAD: 100% occluded at ostium.    LCx: Non-dominant vessel and 100% occluded at  ostium.   RCA: Large dominant vessel giving rise to RPDA and RPLB and feeding the left coronary arteries via collaterals. RCA has mild diffuse disease w/o focal stenosis. RPLB is a large branching vessel w/o any significant stenosis. RPDA is a medium size vessel and is subtotally occluded in mid segment.   He underwent PCI/DES to LM/LCx on 02/22/17 and was started on ASA 81 and Plavix 75.   Subjective:   He is feeling better this morning. No CP or SOB at rest. No gross bleeding from anywhere.   Medications:     Current Facility-Administered Medications   Medication Dose Route Frequency   . atorvastatin  80 mg Oral QHS   . cyanocobalamin  1,000 mcg Intramuscular Q24H   . iron sucrose  200 mg Intravenous Q24H   . pantoprazole  40 mg Intravenous BID         Physical Exam:     Vitals:    03/05/17 0354   BP: 93/54   Pulse: 74   Resp: 20   Temp: 98.5 F (36.9 C)   SpO2: 95%       Intake and Output Summary (Last 24 hours) at Date Time    Intake/Output Summary (Last 24 hours) at 03/05/17 0636  Last data  filed at 03/04/17 2343   Gross per 24 hour   Intake              368 ml   Output              725 ml   Net             -357 ml       General appearance - in no distress   Head: NC/AT, mm moist  Neck - supple, Carotids 2+, no bruit, no JVD   Chest - clear to auscultation, no wheezes, rales or rhonchi, symmetric air entry   Heart - normal rate and regular rhythm, S1 and S2 normal, no murmurs   Abdomen - soft, nontender, nondistended, no masses or organomegaly   Extremities - peripheral pulses normal, no pedal edema, no clubbing or cyanosis        Labs:   Recent CBC   Recent Labs      03/05/17   0357   RBC  3.39*   Hgb  9.6*   Hematocrit  30.1*   MCV  88.8   MCH  28.3   MCHC  31.9*   RDW  17*   MPV  10.2   Platelets  425*     Recent BMP   Recent Labs      03/05/17   0357   Glucose  81   BUN  33.0*   Creatinine  1.3   Calcium  8.1   Sodium  136   Potassium  3.9   Chloride  108   CO2  17*     Recent CMP   Recent Labs       03/05/17   0357   Glucose  81   BUN  33.0*   Creatinine  1.3   Sodium  136   CO2  17*   Calcium  8.1   Albumin  2.5*   AST (SGOT)  17   ALT  12   Globulin  2.8     Recent CARDIAC ENZYMES No results for input(s): TROPONIN, ISTATTROPONI, CK, CKMB in the last 24 hours.    Invalid input(s): TROPONINT  Recent TSH No results for input(s): TSH in the last 24 hours.  Recent URIC ACID Invalid input(s): URIC  Recent BLOOD CULTURE No results for input(s): CXBLD in the last 24 hours.  Recent URINE CULTURE Invalid input(s): CXURN  Recent OCCULT BLOOD Invalid input(s): OCCBL  Recent PT/PTT No results for input(s): INR, PTT in the last 24 hours.    Invalid input(s): PTI, COUM, COUMP, ACOAG, ACOAP  Recent PT/INR No results for input(s): INR in the last 24 hours.    Invalid input(s): PTI, COUM, COUMP  Recent LIPID PANEL No results for input(s): CHOL, TRIG, HDL in the last 24 hours.    Invalid input(s): LDLC, VLDLC, LRAT  Recent ABG No results for input(s): TEMP, FIO2, RATE, MODE, ETCO2, PEEP in the last 24 hours.    Invalid input(s): APH, APCO2, APO2, AHCO3, ATCO2, ABE, AOSAT, ABGS, ALLEN, STATS, O2DEL, O2FLO, PRESS, VNTMN, PRSUP, TIVOL      Rads:   Radiological Procedure reviewed.    Signed by: Valerie Roys, MD, [facility]

## 2017-03-06 LAB — COMPREHENSIVE METABOLIC PANEL
ALT: 16 U/L (ref 0–55)
AST (SGOT): 17 U/L (ref 5–34)
Albumin/Globulin Ratio: 0.9 (ref 0.9–2.2)
Albumin: 2.5 g/dL — ABNORMAL LOW (ref 3.5–5.0)
Alkaline Phosphatase: 89 U/L (ref 38–106)
BUN: 28 mg/dL (ref 9.0–28.0)
Bilirubin, Total: 0.6 mg/dL (ref 0.2–1.2)
CO2: 18 mEq/L — ABNORMAL LOW (ref 22–29)
Calcium: 8 mg/dL (ref 7.9–10.2)
Chloride: 108 mEq/L (ref 100–111)
Creatinine: 1.1 mg/dL (ref 0.7–1.3)
Globulin: 2.9 g/dL (ref 2.0–3.6)
Glucose: 92 mg/dL (ref 70–100)
Potassium: 4 mEq/L (ref 3.5–5.1)
Protein, Total: 5.4 g/dL — ABNORMAL LOW (ref 6.0–8.3)
Sodium: 136 mEq/L (ref 136–145)

## 2017-03-06 LAB — CBC AND DIFFERENTIAL
Absolute NRBC: 0.03 10*3/uL — ABNORMAL HIGH
Basophils Absolute Automated: 0.04 10*3/uL (ref 0.00–0.20)
Basophils Automated: 0.5 %
Eosinophils Absolute Automated: 0.51 10*3/uL (ref 0.00–0.70)
Eosinophils Automated: 6.1 %
Hematocrit: 31.9 % — ABNORMAL LOW (ref 42.0–52.0)
Hgb: 10.2 g/dL — ABNORMAL LOW (ref 13.0–17.0)
Immature Granulocytes Absolute: 0.05 10*3/uL
Immature Granulocytes: 0.6 %
Lymphocytes Absolute Automated: 0.58 10*3/uL (ref 0.50–4.40)
Lymphocytes Automated: 7 %
MCH: 28.7 pg (ref 28.0–32.0)
MCHC: 32 g/dL (ref 32.0–36.0)
MCV: 89.6 fL (ref 80.0–100.0)
MPV: 10.1 fL (ref 9.4–12.3)
Monocytes Absolute Automated: 1.25 10*3/uL — ABNORMAL HIGH (ref 0.00–1.20)
Monocytes: 15 %
Neutrophils Absolute: 5.9 10*3/uL (ref 1.80–8.10)
Neutrophils: 70.8 %
Nucleated RBC: 0.4 /100 WBC (ref 0.0–1.0)
Platelets: 449 10*3/uL — ABNORMAL HIGH (ref 140–400)
RBC: 3.56 10*6/uL — ABNORMAL LOW (ref 4.70–6.00)
RDW: 18 % — ABNORMAL HIGH (ref 12–15)
WBC: 8.33 10*3/uL (ref 3.50–10.80)

## 2017-03-06 LAB — HEMOGLOBIN AND HEMATOCRIT, BLOOD
Hematocrit: 32.4 % — ABNORMAL LOW (ref 42.0–52.0)
Hgb: 10.2 g/dL — ABNORMAL LOW (ref 13.0–17.0)

## 2017-03-06 LAB — GFR: EGFR: >60

## 2017-03-06 MED ORDER — LISINOPRIL 5 MG PO TABS
5.00 mg | ORAL_TABLET | Freq: Every day | ORAL | Status: DC
Start: 2017-03-06 — End: 2017-03-07
  Administered 2017-03-06 – 2017-03-07 (×2): 5 mg via ORAL
  Filled 2017-03-06 (×2): qty 1

## 2017-03-06 NOTE — Progress Notes (Signed)
Medicine Shift Note    Patient Lines/Drains/Airways Status    Active Lines, Drains and Airways     Name:   Placement date:   Placement time:   Site:   Days:    Peripheral IV 03/04/17 Right Forearm  03/04/17    0230    Forearm    2                Last BM: 03/05/17    Pending Orders: NA    Discharge Plan: MD to clear    POC: Daughter at bedside today    Skin: red sacrum / scattered bruising    Tele: yes - hx of afib    Activity: oob with standby assist    Interpreter Needs: No    Shift Note: Pt rested during the day. No complaints of pain.

## 2017-03-06 NOTE — Progress Notes (Signed)
PROGRESS NOTE    Date Time: 03/06/17 10:39 AM  Patient Name: [name],[name]      Assessment:   1.Anemia  2.CAD S/P  DES (12/5)  3.Afib  4.AVM  5.HTN  Plan:   -Pt is S/P 2 units of PRBC Tx  -Cont with IV Venofer 200 mg IV X 5  -Cont with B12 1000 mcg IM daily for 7 days  -Hb trending up nicely   -Off of ASA  -Was started on Plavix  -Will cont to monitor CBC  Cont plan of care  All labs, notes and past 24 hrs events reviewed   Subjective:   Pt was seen at bedside, feels better today, no N/V/D, no HA or Blurry vision   Medications:     Current Facility-Administered Medications   Medication Dose Route Frequency   . atorvastatin  80 mg Oral QHS   . carvedilol  3.125 mg Oral Q12H SCH   . clopidogrel  75 mg Oral Daily   . cyanocobalamin  1,000 mcg Intramuscular Q24H   . iron sucrose  200 mg Intravenous Q24H   . pantoprazole  40 mg Intravenous BID       Review of Systems:   A comprehensive review of systems was: History obtained from chart review and the patient  General ROS: negative for - chills, fatigue or fever  ENT ROS: negative for - epistaxis or headaches  Hematological and Lymphatic ROS: negative for - bleeding problems or night sweats  Respiratory ROS: no cough, shortness of breath, or wheezing  Cardiovascular ROS: no chest pain or dyspnea on exertion  Gastrointestinal ROS: no abdominal pain, change in bowel habits, or black or bloody stools  Genito-Urinary ROS: no dysuria, trouble voiding, or hematuria  Musculoskeletal ROS: negative for - gait disturbance or joint pain  Neurological ROS: no TIA or stroke symptoms  Dermatological ROS: negative for acne, dry skin, eczema and hair changes    Physical Exam:     Vitals:    03/06/17 0855   BP: 106/63   Pulse: 76   Resp:    Temp:    SpO2:        Intake and Output Summary (Last 24 hours) at Date Time    Intake/Output Summary (Last 24 hours) at 03/06/17 1039  Last data filed at 03/05/17 1728   Gross per 24 hour   Intake                0 ml   Output                3 ml   Net                -3 ml       General appearance: Elderly, well nourished, appears stated age and in NAD  Eyes: Sclera anicteric, pink conjunctivae, no ptosis  ENMT: mucous membranes moist, nose and ears appear normal. Oropharynx clear.  Chest: Non labored respirations, no audible wheezing, no clubbing or cyanosis  CV: Regular rate and rhythm, no JVD, no LE edema  Abdomen: soft, non-tender, non-distended, no masses or organomegaly  Skin: Normal color and turgor, no rashes, no suspicious skin lesions noted  Neuro: CN II-XII grossly intact. No gross movement disorders noted.  Mental status: Appropriate affect, alert and oriented x 3    Labs:     Results     Procedure Component Value Units Date/Time    Comprehensive metabolic panel [[id number]]  (Abnormal) Collected:  03/06/17 0450  Specimen:  Blood Updated:  03/06/17 0534     Glucose 92 mg/dL      BUN 16.1 mg/dL      Creatinine 1.1 mg/dL      Sodium 096 mEq/L      Potassium 4.0 mEq/L      Chloride 108 mEq/L      CO2 18 (L) mEq/L      Calcium 8.0 mg/dL      Protein, Total 5.4 (L) g/dL      Albumin 2.5 (L) g/dL      AST (SGOT) 17 U/L      ALT 16 U/L      Alkaline Phosphatase 89 U/L      Bilirubin, Total 0.6 mg/dL      Globulin 2.9 g/dL      Albumin/Globulin Ratio 0.9    GFR [[id number]] Collected:  03/06/17 0450     Updated:  03/06/17 0534     EGFR >60.0    CBC and differential [[id number]]  (Abnormal) Collected:  03/06/17 0450    Specimen:  Blood from Blood Updated:  03/06/17 0518     WBC 8.33 x10 3/uL      Hgb 10.2 (L) g/dL      Hematocrit 21.3 (L) %      Platelets 449 (H) x10 3/uL      RBC 3.56 (L) x10 6/uL      MCV 89.6 fL      MCH 28.7 pg      MCHC 32.0 g/dL      RDW 18 (H) %      MPV 10.1 fL      Neutrophils 70.8 %      Lymphocytes Automated 7.0 %      Monocytes 15.0 %      Eosinophils Automated 6.1 %      Basophils Automated 0.5 %      Immature Granulocyte 0.6 %      Nucleated RBC 0.4 /100 WBC      Neutrophils Absolute 5.90 x10 3/uL      Abs Lymph Automated 0.58  x10 3/uL      Abs Mono Automated 1.25 (H) x10 3/uL      Abs Eos Automated 0.51 x10 3/uL      Absolute Baso Automated 0.04 x10 3/uL      Absolute Immature Granulocyte 0.05 x10 3/uL      Absolute NRBC 0.03 (H) x10 3/uL     Hemoglobin and hematocrit, blood [[id number]]  (Abnormal) Collected:  03/05/17 1825    Specimen:  Blood Updated:  03/05/17 1839     Hgb 10.3 (L) g/dL      Hematocrit 62.9 (L) %               Rads:   No results found.  Radiological Procedure reviewed.    Signed by: Javione Bellow

## 2017-03-06 NOTE — Plan of Care (Signed)
Problem: Moderate/High Fall Risk Score >5  Goal: Patient will remain free of falls  Outcome: Progressing   03/06/17 1149   OTHER   Moderate Risk (6-13) MOD-Initiate Yellow "Fall Risk" magnet communication tool;MOD-Remain with patient during toileting;MOD-Utilize diversion activities;MOD-Place Fall Risk level on whiteboard in room       Problem: Safety  Goal: Patient will be free from injury during hospitalization  Outcome: Progressing   03/05/17 2009   Goal/Interventions addressed this shift   Patient will be free from injury during hospitalization  Assess patient's risk for falls and implement fall prevention plan of care per policy;Provide and maintain safe environment;Use appropriate transfer methods;Provide alternative method of communication if needed [employer], writing);Assess for patients risk for elopement and implement Elopement Risk Plan per policy;Hourly rounding;Include patient/ family/ care giver in decisions related to safety;Ensure appropriate safety devices are available at the bedside     Goal: Patient will be free from infection during hospitalization  Outcome: Progressing   03/06/17 1149   Goal/Interventions addressed this shift   Free from Infection during hospitalization Assess and monitor for signs and symptoms of infection;Monitor lab/diagnostic results;Monitor all insertion sites (i.e. indwelling lines, tubes, urinary catheters, and drains)       Problem: Discharge Barriers  Goal: Patient will be discharged home or other facility with appropriate resources  Outcome: Progressing   03/06/17 1149   Goal/Interventions addressed this shift   Discharge to home or other facility with appropriate resources Provide appropriate patient education;Provide information on available health resources       Problem: Psychosocial and Spiritual Needs  Goal: Demonstrates ability to cope with hospitalization/illness  Outcome: Progressing   03/06/17 1149   Goal/Interventions addressed this shift    Demonstrates ability to cope with hospitalizations/illness Provide quiet environment;Encourage verbalization of feelings/concerns/expectations;Encourage participation in diversional activity;Include patient/ patient care companion in decisions       Problem: Altered GI Function  Goal: Mobility/Activity is maintained at optimal level for patient  Outcome: Progressing   03/06/17 1149   Goal/Interventions addressed this shift   Mobility/activity is maintained at optimal level for patient Increase mobility as tolerated/progressive mobility;Encourage independent activity per ability;Plan activities to conserve energy, plan rest periods;Reposition patient every 2 hours and as needed unless able to reposition self     Goal: No bleeding  Outcome: Progressing   03/06/17 1149   Goal/Interventions addressed this shift   No bleeding  Monitor and assess vitals and hemodynamic parameters;Monitor/assess lab values and report abnormal values;Assess for bruising/petechia

## 2017-03-06 NOTE — Progress Notes (Signed)
PROGRESS NOTE    Date Time: 03/06/17 10:19 AM  Patient Name: [name],[name]  Attending Physician: Schuyler Amor, MD    Assessment:   Acute anemia due to acute blood loss from GIB  Recent Hx AVM on endoscopy   Severe CAD s/p PCI with DES 02/23/17  Baseline low BP   Plan:   H/h improved post transfusion 10.2/31.9   Monitor labs  On Plavix  Continue venofer   PT and OT   D/w patient and staff care coordinated   History of Present Illness:   [name] is a 83 y.o. male who presents to the hospital with abnormally low h/h in out patient labs   He noticed pasty maroon color stools for th epast few days   No cp, SOB, nausea vomiting  or abdominal pain   RES is negative for dizziness or lightheadedness   No hematamasis   Past Medical History:     Past Medical History:   Diagnosis Date   . Anemia     iron low   . DOE (dyspnea on exertion)     mild   . Glaucoma NEC    . Hypertensive disorder    . Lupus 1990   . Skin cancer     2006 removed mole   . Ulcerative colitis, chronic    . Vision abnormalities        Past Surgical History:     Past Surgical History:   Procedure Laterality Date   . CARDIAC SURGERY      3 stents   . EGD, COLONOSCOPY  04/03/2012    Procedure: EGD, COLONOSCOPY;  Surgeon: Lestine Mount, MD;  Location: [facility];  Service: Gastroenterology;  Laterality: N/A;   . EGD, COLONOSCOPY N/A 02/11/2017    Procedure: EGD, COLONOSCOPY;  Surgeon: Lestine Mount, MD;  Location: [facility];  Service: Gastroenterology;  Laterality: N/A;   . EYE SURGERY      cataract       Family History:   History reviewed. No pertinent family history.    Social History:     Social History     Social History   . Marital status: Married     Spouse name: N/A   . Number of children: N/A   . Years of education: N/A     Social History Main Topics   . Smoking status: Former Smoker     Packs/day: 1.00     Years: 12.00     Types: Cigarettes     Quit date: 12/18/1961   . Smokeless tobacco: Never Used   . Alcohol use 0.0 oz/week      Comment:  0.5oz/d   . Drug use: No   . Sexual activity: Not on file     Other Topics Concern   . Not on file     Social History Narrative   . No narrative on file       Allergies:   No Known Allergies    Medications:     Prescriptions Prior to Admission   Medication Sig   . FUROSEMIDE PO Take by mouth.   Marland Kitchen aspirin 81 MG chewable tablet Chew 1 tablet (81 mg total) by mouth daily.   Marland Kitchen atorvastatin (LIPITOR) 80 MG tablet Take 1 tablet (80 mg total) by mouth nightly.   . carvedilol (COREG) 3.125 MG tablet Take 1 tablet (3.125 mg total) by mouth every 12 (twelve) hours.   . clopidogrel (PLAVIX) 75 mg tablet Take 1 tablet (75  mg total) by mouth daily.   . IRON PO Take 200 mg by mouth daily.   Marland Kitchen lisinopril (PRINIVIL,ZESTRIL) 10 MG tablet Take 1 tablet (10 mg total) by mouth every 12 (twelve) hours.   . pantoprazole (PROTONIX) 40 MG tablet Take 1 tablet (40 mg total) by mouth 2 (two) times daily.       Review of Systems:   A comprehensive review of systems was: per HPI     Physical Exam:     Vitals:    03/06/17 0855   BP: 106/63   Pulse: 76   Resp:    Temp:    SpO2:        Intake and Output Summary (Last 24 hours) at Date Time    Intake/Output Summary (Last 24 hours) at 03/06/17 1019  Last data filed at 03/05/17 1728   Gross per 24 hour   Intake                0 ml   Output                3 ml   Net               -3 ml       NAD   ALERT AND ORIENT X3  EOMI, PEERLA , PALE SCLERA  S1, S2, CTA BILATERALLY   NEURO INTACT CRANIAL NERVES  NO PEDAL EDEMA   NO BONE DEFORMITY     Labs:     Results     Procedure Component Value Units Date/Time    Comprehensive metabolic panel [[id number]]  (Abnormal) Collected:  03/06/17 0450    Specimen:  Blood Updated:  03/06/17 0534     Glucose 92 mg/dL      BUN 82.9 mg/dL      Creatinine 1.1 mg/dL      Sodium 562 mEq/L      Potassium 4.0 mEq/L      Chloride 108 mEq/L      CO2 18 (L) mEq/L      Calcium 8.0 mg/dL      Protein, Total 5.4 (L) g/dL      Albumin 2.5 (L) g/dL      AST (SGOT) 17 U/L      ALT 16 U/L       Alkaline Phosphatase 89 U/L      Bilirubin, Total 0.6 mg/dL      Globulin 2.9 g/dL      Albumin/Globulin Ratio 0.9    GFR [[id number]] Collected:  03/06/17 0450     Updated:  03/06/17 0534     EGFR >60.0    CBC and differential [[id number]]  (Abnormal) Collected:  03/06/17 0450    Specimen:  Blood from Blood Updated:  03/06/17 0518     WBC 8.33 x10 3/uL      Hgb 10.2 (L) g/dL      Hematocrit 13.2 (L) %      Platelets 449 (H) x10 3/uL      RBC 3.56 (L) x10 6/uL      MCV 89.6 fL      MCH 28.7 pg      MCHC 32.0 g/dL      RDW 18 (H) %      MPV 10.1 fL      Neutrophils 70.8 %      Lymphocytes Automated 7.0 %      Monocytes 15.0 %      Eosinophils Automated 6.1 %  Basophils Automated 0.5 %      Immature Granulocyte 0.6 %      Nucleated RBC 0.4 /100 WBC      Neutrophils Absolute 5.90 x10 3/uL      Abs Lymph Automated 0.58 x10 3/uL      Abs Mono Automated 1.25 (H) x10 3/uL      Abs Eos Automated 0.51 x10 3/uL      Absolute Baso Automated 0.04 x10 3/uL      Absolute Immature Granulocyte 0.05 x10 3/uL      Absolute NRBC 0.03 (H) x10 3/uL     Hemoglobin and hematocrit, blood [[id number]]  (Abnormal) Collected:  03/05/17 1825    Specimen:  Blood Updated:  03/05/17 1839     Hgb 10.3 (L) g/dL      Hematocrit 40.9 (L) %               Rads:   Radiological Procedure reviewed.    Signed by: Schuyler Amor MD  [phone]

## 2017-03-06 NOTE — OT Progress Note (Signed)
Occupational Therapy Cancellation Note    Patient: [name]  UVO:[id number]    Unit: K742/V956.38    Patient not seen for occupational therapy secondary to pt agitated by orientation questions and declining to participate. 2nd attempt made, but pt still declining.     Pati Gallo, OTR/L   [id number]

## 2017-03-06 NOTE — Progress Notes (Signed)
Medicine Shift Note    Patient Lines/Drains/Airways Status    Active Lines, Drains and Airways     Name:   Placement date:   Placement time:   Site:   Days:    Peripheral IV 03/04/17 Right Forearm  03/04/17    0230    Forearm    2                Last BM 03-05-17    Pending Orders: no pending test    Discharge Plan:Discharge planning in progress by doctor. Pt will possibly go home on medications at discharge.    YQM:VHQI , medications    Skin:no changes    Tele:Telemetry monitor in place    Activity:oob with assist.    Interpreter Needs: no Used: no Method: no Waiver Need/present: no    Shift Note: The pt sleeping well most of this shift. VSS. No respiratory distress. Denied pain and nausea. No vomiting. The pt stated he had  A very large BM on the day shift. All needs met.Call light within reach

## 2017-03-06 NOTE — Progress Notes (Signed)
PROGRESS NOTE    Date Time: 03/06/17 10:39 AM  Patient Name: [name]      Assessment:   1.Anemia  2.CAD S/P  DES (12/5)  3.Afib  4.AVM  5.HTN  Plan:   -Pt is S/P 2 units of PRBC Tx  -Cont with IV Venofer 200 mg IV X 5  -Cont with B12 1000 mcg IM daily for 7 days  -Hb trending up nicely   -Will cont to monitor CBC  Cont plan of care  All labs, notes and past 24 hrs events reviewed   Subjective:   Pt was seen at bedside, feels better today, no N/V/D, no HA or Blurry vision   Medications:     Current Facility-Administered Medications   Medication Dose Route Frequency   . atorvastatin  80 mg Oral QHS   . carvedilol  3.125 mg Oral Q12H SCH   . clopidogrel  75 mg Oral Daily   . cyanocobalamin  1,000 mcg Intramuscular Q24H   . iron sucrose  200 mg Intravenous Q24H   . pantoprazole  40 mg Intravenous BID       Review of Systems:   A comprehensive review of systems was: History obtained from chart review and the patient  General ROS: negative for - chills, fatigue or fever  ENT ROS: negative for - epistaxis or headaches  Hematological and Lymphatic ROS: negative for - bleeding problems or night sweats  Respiratory ROS: no cough, shortness of breath, or wheezing  Cardiovascular ROS: no chest pain or dyspnea on exertion  Gastrointestinal ROS: no abdominal pain, change in bowel habits, or black or bloody stools  Genito-Urinary ROS: no dysuria, trouble voiding, or hematuria  Musculoskeletal ROS: negative for - gait disturbance or joint pain  Neurological ROS: no TIA or stroke symptoms  Dermatological ROS: negative for acne, dry skin, eczema and hair changes    Physical Exam:     Vitals:    03/06/17 0855   BP: 106/63   Pulse: 76   Resp:    Temp:    SpO2:        Intake and Output Summary (Last 24 hours) at Date Time    Intake/Output Summary (Last 24 hours) at 03/06/17 1039  Last data filed at 03/05/17 1728   Gross per 24 hour   Intake                0 ml   Output                3 ml   Net               -3 ml       General  appearance: Elderly, well nourished, appears stated age and in NAD  Eyes: Sclera anicteric, pink conjunctivae, no ptosis  ENMT: mucous membranes moist, nose and ears appear normal. Oropharynx clear.  Chest: Non labored respirations, no audible wheezing, no clubbing or cyanosis  CV: Regular rate and rhythm, no JVD, no LE edema  Abdomen: soft, non-tender, non-distended, no masses or organomegaly  Skin: Normal color and turgor, no rashes, no suspicious skin lesions noted  Neuro: CN II-XII grossly intact. No gross movement disorders noted.  Mental status: Appropriate affect, alert and oriented x 3    Labs:     Results     Procedure Component Value Units Date/Time    Comprehensive metabolic panel [[id number]]  (Abnormal) Collected:  03/06/17 0450    Specimen:  Blood Updated:  03/06/17 0534  Glucose 92 mg/dL      BUN 53.6 mg/dL      Creatinine 1.1 mg/dL      Sodium 644 mEq/L      Potassium 4.0 mEq/L      Chloride 108 mEq/L      CO2 18 (L) mEq/L      Calcium 8.0 mg/dL      Protein, Total 5.4 (L) g/dL      Albumin 2.5 (L) g/dL      AST (SGOT) 17 U/L      ALT 16 U/L      Alkaline Phosphatase 89 U/L      Bilirubin, Total 0.6 mg/dL      Globulin 2.9 g/dL      Albumin/Globulin Ratio 0.9    GFR [[id number]] Collected:  03/06/17 0450     Updated:  03/06/17 0534     EGFR >60.0    CBC and differential [[id number]]  (Abnormal) Collected:  03/06/17 0450    Specimen:  Blood from Blood Updated:  03/06/17 0518     WBC 8.33 x10 3/uL      Hgb 10.2 (L) g/dL      Hematocrit 29.5 (L) %      Platelets 449 (H) x10 3/uL      RBC 3.56 (L) x10 6/uL      MCV 89.6 fL      MCH 28.7 pg      MCHC 32.0 g/dL      RDW 18 (H) %      MPV 10.1 fL      Neutrophils 70.8 %      Lymphocytes Automated 7.0 %      Monocytes 15.0 %      Eosinophils Automated 6.1 %      Basophils Automated 0.5 %      Immature Granulocyte 0.6 %      Nucleated RBC 0.4 /100 WBC      Neutrophils Absolute 5.90 x10 3/uL      Abs Lymph Automated 0.58 x10 3/uL      Abs Mono Automated  1.25 (H) x10 3/uL      Abs Eos Automated 0.51 x10 3/uL      Absolute Baso Automated 0.04 x10 3/uL      Absolute Immature Granulocyte 0.05 x10 3/uL      Absolute NRBC 0.03 (H) x10 3/uL     Hemoglobin and hematocrit, blood [[id number]]  (Abnormal) Collected:  03/05/17 1825    Specimen:  Blood Updated:  03/05/17 1839     Hgb 10.3 (L) g/dL      Hematocrit 30.1 (L) %               Rads:   No results found.  Radiological Procedure reviewed.    Signed by: Ayansh Bellow

## 2017-03-06 NOTE — Progress Notes (Signed)
[facility]   Madisen Ludvigsen M. Lucianne Muss, MD, [facility]  PROGRESS NOTE  Office: [phone]  Cell phone [phone]                                       Date Time: 03/06/17 11:21 AM  Patient Name: [name],[name]      Assessment:    Acute GI bleed- no apparent active bleeding today.    Severe symptomatic anemia- improving after transfusion   Acute kidney injury- improving.    Hypotension, resolved.   Tachycardia, resolved.   Two-vessel obstructive CAD s/p PCI/DES to LM/LCx on 02/22/17.    Ventricular tachycardia, non-sustained, recorded on tele on 02/10/17 at 12:07   Atrial fibrillation, paroxysmal. Patient with high risk of rebleed with use of anticoagulation given multiple AVMs (stomach, duodenum and cecum) noted on endoscopic procedures.   CHF with reduced EF of 36%   Severe pulmonary hypertension with an estimated PASP of 63 mmHg by echo   Dyslipidemia   Plan:    Keep off Aspirin   Plavix 75   Lipitor 80 mg qhs   Coreg 3.125 mg bid    Restart Lisinopril 5 mg bid   Recent Cardiac Work up:   EKG 03/03/17:   SR, IRBBB, PACs, NS T wave abnormality, prolonged QT  Echo 02/11/17:   The left ventricle is normal in size.  * Left ventricular systolic function is moderately decreased with an  ejection fraction by Biplane Method of Discs of 36 %.  * Left Ventricular diastolic filling parameters are consistent with Grade II  diastolic dysfunction (pseudonormal pattern).  * Right ventricular size and systolic function are normal.  * Mild bi-atrial enlargement.  * Moderate to severe mitral valve regurgitation.  * There is mild tricuspid regurgitation.  * Moderate pulmonary hypertension with estimated right ventricular systolic  pressure of 63.29 mmHg.  Coronary Angiography 02/18/17:   LM: 100% occluded distally.    LAD: 100% occluded at ostium.    LCx: Non-dominant vessel and 100% occluded at ostium.   RCA: Large dominant vessel giving rise to RPDA and RPLB and feeding the left coronary  arteries via collaterals. RCA has mild diffuse disease w/o focal stenosis. RPLB is a large branching vessel w/o any significant stenosis. RPDA is a medium size vessel and is subtotally occluded in mid segment.   He underwent PCI/DES to LM/LCx on 02/22/17 and was started on ASA 81 and Plavix 75.   Subjective:   He is feeling better this morning. No CP or SOB at rest. No gross bleeding from anywhere.   Medications:     Current Facility-Administered Medications   Medication Dose Route Frequency   . atorvastatin  80 mg Oral QHS   . carvedilol  3.125 mg Oral Q12H SCH   . clopidogrel  75 mg Oral Daily   . cyanocobalamin  1,000 mcg Intramuscular Q24H   . iron sucrose  200 mg Intravenous Q24H   . pantoprazole  40 mg Intravenous BID         Physical Exam:     Vitals:    03/06/17 0855   BP: 106/63   Pulse: 76   Resp:    Temp:    SpO2:        Intake and Output Summary (Last 24 hours) at Date Time    Intake/Output Summary (Last 24 hours) at 03/06/17 1121  Last data  filed at 03/05/17 1728   Gross per 24 hour   Intake                0 ml   Output                3 ml   Net               -3 ml       General appearance - in no distress   Head: NC/AT, mm moist  Neck - supple, Carotids 2+, no bruit, no JVD   Chest - clear to auscultation, no wheezes, rales or rhonchi, symmetric air entry   Heart - normal rate and regular rhythm, S1 and S2 normal, no murmurs   Abdomen - soft, nontender, nondistended, no masses or organomegaly   Extremities - peripheral pulses normal, no pedal edema, no clubbing or cyanosis        Labs:   Recent CBC   Recent Labs      03/06/17   0450   RBC  3.56*   Hgb  10.2*   Hematocrit  31.9*   MCV  89.6   MCH  28.7   MCHC  32.0   RDW  18*   MPV  10.1   Platelets  449*     Recent BMP   Recent Labs      03/06/17   0450   Glucose  92   BUN  28.0   Creatinine  1.1   Calcium  8.0   Sodium  136   Potassium  4.0   Chloride  108   CO2  18*     Recent CMP   Recent Labs      03/06/17   0450   Glucose  92   BUN  28.0    Creatinine  1.1   Sodium  136   CO2  18*   Calcium  8.0   Albumin  2.5*   AST (SGOT)  17   ALT  16   Globulin  2.9     Recent CARDIAC ENZYMES No results for input(s): TROPONIN, ISTATTROPONI, CK, CKMB in the last 24 hours.    Invalid input(s): TROPONINT  Recent TSH No results for input(s): TSH in the last 24 hours.  Recent URIC ACID Invalid input(s): URIC  Recent BLOOD CULTURE No results for input(s): CXBLD in the last 24 hours.  Recent URINE CULTURE Invalid input(s): CXURN  Recent OCCULT BLOOD Invalid input(s): OCCBL  Recent PT/PTT No results for input(s): INR, PTT in the last 24 hours.    Invalid input(s): PTI, COUM, COUMP, ACOAG, ACOAP  Recent PT/INR No results for input(s): INR in the last 24 hours.    Invalid input(s): PTI, COUM, COUMP  Recent LIPID PANEL No results for input(s): CHOL, TRIG, HDL in the last 24 hours.    Invalid input(s): LDLC, VLDLC, LRAT  Recent ABG No results for input(s): TEMP, FIO2, RATE, MODE, ETCO2, PEEP in the last 24 hours.    Invalid input(s): APH, APCO2, APO2, AHCO3, ATCO2, ABE, AOSAT, ABGS, ALLEN, STATS, O2DEL, O2FLO, PRESS, VNTMN, PRSUP, TIVOL      Rads:   Radiological Procedure reviewed.    Signed by: Valerie Roys, MD, [facility]

## 2017-03-07 LAB — COMPREHENSIVE METABOLIC PANEL
ALT: 15 U/L (ref 0–55)
AST (SGOT): 15 U/L (ref 5–34)
Albumin/Globulin Ratio: 0.9 (ref 0.9–2.2)
Albumin: 2.4 g/dL — ABNORMAL LOW (ref 3.5–5.0)
Alkaline Phosphatase: 86 U/L (ref 38–106)
BUN: 25 mg/dL (ref 9.0–28.0)
Bilirubin, Total: 0.6 mg/dL (ref 0.2–1.2)
CO2: 18 mEq/L — ABNORMAL LOW (ref 22–29)
Calcium: 7.8 mg/dL — ABNORMAL LOW (ref 7.9–10.2)
Chloride: 107 mEq/L (ref 100–111)
Creatinine: 1.1 mg/dL (ref 0.7–1.3)
Globulin: 2.8 g/dL (ref 2.0–3.6)
Glucose: 94 mg/dL (ref 70–100)
Potassium: 3.8 mEq/L (ref 3.5–5.1)
Protein, Total: 5.2 g/dL — ABNORMAL LOW (ref 6.0–8.3)
Sodium: 135 mEq/L — ABNORMAL LOW (ref 136–145)

## 2017-03-07 LAB — CBC AND DIFFERENTIAL
Absolute NRBC: 0 10*3/uL
Basophils Absolute Automated: 0.02 10*3/uL (ref 0.00–0.20)
Basophils Automated: 0.3 %
Eosinophils Absolute Automated: 0.42 10*3/uL (ref 0.00–0.70)
Eosinophils Automated: 5.4 %
Hematocrit: 31.4 % — ABNORMAL LOW (ref 42.0–52.0)
Hgb: 9.8 g/dL — ABNORMAL LOW (ref 13.0–17.0)
Immature Granulocytes Absolute: 0.04 10*3/uL
Immature Granulocytes: 0.5 %
Lymphocytes Absolute Automated: 0.5 10*3/uL (ref 0.50–4.40)
Lymphocytes Automated: 6.5 %
MCH: 28.4 pg (ref 28.0–32.0)
MCHC: 31.2 g/dL — ABNORMAL LOW (ref 32.0–36.0)
MCV: 91 fL (ref 80.0–100.0)
MPV: 10 fL (ref 9.4–12.3)
Monocytes Absolute Automated: 1.1 10*3/uL (ref 0.00–1.20)
Monocytes: 14.2 %
Neutrophils Absolute: 5.67 10*3/uL (ref 1.80–8.10)
Neutrophils: 73.1 %
Nucleated RBC: 0 /100 WBC (ref 0.0–1.0)
Platelets: 414 10*3/uL — ABNORMAL HIGH (ref 140–400)
RBC: 3.45 10*6/uL — ABNORMAL LOW (ref 4.70–6.00)
RDW: 18 % — ABNORMAL HIGH (ref 12–15)
WBC: 7.75 10*3/uL (ref 3.50–10.80)

## 2017-03-07 LAB — GFR: EGFR: >60

## 2017-03-07 MED ORDER — LISINOPRIL 5 MG PO TABS
5.00 mg | ORAL_TABLET | Freq: Two times a day (BID) | ORAL | 0 refills | Status: AC
Start: 2017-03-07 — End: 2017-04-06

## 2017-03-07 MED ORDER — LISINOPRIL 5 MG PO TABS
5.00 mg | ORAL_TABLET | Freq: Two times a day (BID) | ORAL | Status: DC
Start: 2017-03-07 — End: 2017-03-07

## 2017-03-07 NOTE — Progress Notes (Signed)
[facility]   Janesa Dockery M. Lucianne Muss, MD, [facility]  PROGRESS NOTE  Office: [phone]  Cell phone [phone]                                       Date Time: 03/07/17 10:07 AM  Patient Name: [name],[name]      Assessment:    Acute GI bleed- no apparent active bleeding today.    Severe symptomatic anemia- improving after transfusion   Acute kidney injury- improving.    Hypotension, resolved.   Tachycardia, resolved.   Two-vessel obstructive CAD s/p PCI/DES to LM/LCx on 02/22/17. Angina free.   Ventricular tachycardia, non-sustained, recorded on tele on 02/10/17 at 12:07   Atrial fibrillation, paroxysmal. Patient with high risk of rebleed with use of anticoagulation given multiple AVMs (stomach, duodenum and cecum) noted on endoscopic procedures.   CHF with reduced EF of 36%   Severe pulmonary hypertension with an estimated PASP of 63 mmHg by echo   Dyslipidemia   Plan:    Keep off Aspirin   Plavix 75   Lipitor 80 mg qhs   Coreg 3.125 mg bid    Lisinopril 5 mg bid  Recent Cardiac Work up:   EKG 03/03/17:   SR, IRBBB, PACs, NS T wave abnormality, prolonged QT  Echo 02/11/17:   The left ventricle is normal in size.  * Left ventricular systolic function is moderately decreased with an  ejection fraction by Biplane Method of Discs of 36 %.  * Left Ventricular diastolic filling parameters are consistent with Grade II  diastolic dysfunction (pseudonormal pattern).  * Right ventricular size and systolic function are normal.  * Mild bi-atrial enlargement.  * Moderate to severe mitral valve regurgitation.  * There is mild tricuspid regurgitation.  * Moderate pulmonary hypertension with estimated right ventricular systolic  pressure of 63.29 mmHg.  Coronary Angiography 02/18/17:   LM: 100% occluded distally.    LAD: 100% occluded at ostium.    LCx: Non-dominant vessel and 100% occluded at ostium.   RCA: Large dominant vessel giving rise to RPDA and RPLB and feeding the left  coronary arteries via collaterals. RCA has mild diffuse disease w/o focal stenosis. RPLB is a large branching vessel w/o any significant stenosis. RPDA is a medium size vessel and is subtotally occluded in mid segment.   He underwent PCI/DES to LM/LCx on 02/22/17 and was started on ASA 81 and Plavix 75.   Subjective:   He is feeling better this morning. No CP or SOB at rest. No gross bleeding from anywhere.   Medications:     Current Facility-Administered Medications   Medication Dose Route Frequency   . atorvastatin  80 mg Oral QHS   . carvedilol  3.125 mg Oral Q12H SCH   . clopidogrel  75 mg Oral Daily   . cyanocobalamin  1,000 mcg Intramuscular Q24H   . iron sucrose  200 mg Intravenous Q24H   . lisinopril  5 mg Oral Daily   . pantoprazole  40 mg Intravenous BID         Physical Exam:     Vitals:    03/07/17 0739   BP: 114/58   Pulse: 62   Resp: 16   Temp: 98 F (36.7 C)   SpO2: 99%       Intake and Output Summary (Last 24 hours) at Date Time  No intake  or output data in the 24 hours ending 03/07/17 1007    General appearance - in no distress   Head: NC/AT, mm moist  Neck - supple, Carotids 2+, no bruit, no JVD   Chest - clear to auscultation, no wheezes, rales or rhonchi, symmetric air entry   Heart - normal rate and regular rhythm, S1 and S2 normal, no murmurs   Abdomen - soft, nontender, nondistended, no masses or organomegaly   Extremities - peripheral pulses normal, no pedal edema, no clubbing or cyanosis        Labs:   Recent CBC   Recent Labs      03/07/17   0529   RBC  3.45*   Hgb  9.8*   Hematocrit  31.4*   MCV  91.0   MCH  28.4   MCHC  31.2*   RDW  18*   MPV  10.0   Platelets  414*     Recent BMP   Recent Labs      03/07/17   0529   Glucose  94   BUN  25.0   Creatinine  1.1   Calcium  7.8*   Sodium  135*   Potassium  3.8   Chloride  107   CO2  18*     Recent CMP   Recent Labs      03/07/17   0529   Glucose  94   BUN  25.0   Creatinine  1.1   Sodium  135*   CO2  18*   Calcium  7.8*   Albumin  2.4*   AST  (SGOT)  15   ALT  15   Globulin  2.8     Recent CARDIAC ENZYMES No results for input(s): TROPONIN, ISTATTROPONI, CK, CKMB in the last 24 hours.    Invalid input(s): TROPONINT  Recent TSH No results for input(s): TSH in the last 24 hours.  Recent URIC ACID Invalid input(s): URIC  Recent BLOOD CULTURE No results for input(s): CXBLD in the last 24 hours.  Recent URINE CULTURE Invalid input(s): CXURN  Recent OCCULT BLOOD Invalid input(s): OCCBL  Recent PT/PTT No results for input(s): INR, PTT in the last 24 hours.    Invalid input(s): PTI, COUM, COUMP, ACOAG, ACOAP  Recent PT/INR No results for input(s): INR in the last 24 hours.    Invalid input(s): PTI, COUM, COUMP  Recent LIPID PANEL No results for input(s): CHOL, TRIG, HDL in the last 24 hours.    Invalid input(s): LDLC, VLDLC, LRAT  Recent ABG No results for input(s): TEMP, FIO2, RATE, MODE, ETCO2, PEEP in the last 24 hours.    Invalid input(s): APH, APCO2, APO2, AHCO3, ATCO2, ABE, AOSAT, ABGS, ALLEN, STATS, O2DEL, O2FLO, PRESS, VNTMN, PRSUP, TIVOL      Rads:   Radiological Procedure reviewed.    Signed by: Valerie Roys, MD, [facility]

## 2017-03-07 NOTE — Progress Notes (Signed)
Medicine Shift Note    Patient Lines/Drains/Airways Status    Active Lines, Drains and Airways     Name:   Placement date:   Placement time:   Site:   Days:    Peripheral IV 03/04/17 Right Forearm  03/04/17    0230    Forearm    3                Last BM 03-06-17    Pending Orders: N/A    Discharge Plan: Discharge planning in progress when cleared by MD    POC: patient    Skin: redness at sacrum, scattered bruising      Tele: d/t complex cardiac disorders, hx of Afib    Activity: OOB with standby assist.    Interpreter Needs: No    Shift Note: Per pt, had a BM today. Continues to monitor labs.  Last H/H = 10.2/32.4.

## 2017-03-07 NOTE — Progress Notes (Signed)
PROGRESS NOTE    Date Time: 03/07/17 11:40 AM  Patient Name: [name],[name]  Attending Physician: Schuyler Amor, MD    Assessment:   Acute anemia due to acute blood loss from GIB  Recent Hx AVM on endoscopy   Severe CAD s/p PCI with DES 02/23/17  Baseline low BP   Plan:   H/h improved post transfusion 10.2/31.9   Monitor labs  On Plavix off of aspirin   Continue venofer   PT and OT   Discharge planning today   D/w patient and staff care coordinated   History of Present Illness:   [name] is a 83 y.o. male who presents to the hospital with abnormally low h/h in out patient labs   He noticed pasty maroon color stools for th epast few days   No cp, SOB, nausea vomiting  or abdominal pain   RES is negative for dizziness or lightheadedness   No hematamasis   Past Medical History:     Past Medical History:   Diagnosis Date   . Anemia     iron low   . DOE (dyspnea on exertion)     mild   . Glaucoma NEC    . Hypertensive disorder    . Lupus 1990   . Skin cancer     2006 removed mole   . Ulcerative colitis, chronic    . Vision abnormalities        Past Surgical History:     Past Surgical History:   Procedure Laterality Date   . CARDIAC SURGERY      3 stents   . EGD, COLONOSCOPY  04/03/2012    Procedure: EGD, COLONOSCOPY;  Surgeon: Lestine Mount, MD;  Location: [facility];  Service: Gastroenterology;  Laterality: N/A;   . EGD, COLONOSCOPY N/A 02/11/2017    Procedure: EGD, COLONOSCOPY;  Surgeon: Lestine Mount, MD;  Location: [facility];  Service: Gastroenterology;  Laterality: N/A;   . EYE SURGERY      cataract       Family History:   History reviewed. No pertinent family history.    Social History:     Social History     Social History   . Marital status: Married     Spouse name: N/A   . Number of children: N/A   . Years of education: N/A     Social History Main Topics   . Smoking status: Former Smoker     Packs/day: 1.00     Years: 12.00     Types: Cigarettes     Quit date: 12/18/1961   . Smokeless tobacco: Never  Used   . Alcohol use 0.0 oz/week      Comment: 0.5oz/d   . Drug use: No   . Sexual activity: Not on file     Other Topics Concern   . Not on file     Social History Narrative   . No narrative on file       Allergies:   No Known Allergies    Medications:     Prescriptions Prior to Admission   Medication Sig   . FUROSEMIDE PO Take by mouth.   Marland Kitchen aspirin 81 MG chewable tablet Chew 1 tablet (81 mg total) by mouth daily.   Marland Kitchen atorvastatin (LIPITOR) 80 MG tablet Take 1 tablet (80 mg total) by mouth nightly.   . carvedilol (COREG) 3.125 MG tablet Take 1 tablet (3.125 mg total) by mouth every 12 (twelve) hours.   Marland Kitchen  clopidogrel (PLAVIX) 75 mg tablet Take 1 tablet (75 mg total) by mouth daily.   . IRON PO Take 200 mg by mouth daily.   Marland Kitchen lisinopril (PRINIVIL,ZESTRIL) 10 MG tablet Take 1 tablet (10 mg total) by mouth every 12 (twelve) hours.   . pantoprazole (PROTONIX) 40 MG tablet Take 1 tablet (40 mg total) by mouth 2 (two) times daily.       Review of Systems:   A comprehensive review of systems was: per HPI     Physical Exam:     Vitals:    03/07/17 1000   BP: 120/74   Pulse:    Resp:    Temp:    SpO2:        Intake and Output Summary (Last 24 hours) at Date Time  No intake or output data in the 24 hours ending 03/07/17 1140    NAD   ALERT AND ORIENT X3  EOMI, PEERLA , PALE SCLERA  S1, S2, CTA BILATERALLY   NEURO INTACT CRANIAL NERVES  NO PEDAL EDEMA   NO BONE DEFORMITY     Labs:     Results     Procedure Component Value Units Date/Time    GFR [[id number]] Collected:  03/07/17 0529     Updated:  03/07/17 0656     EGFR >60.0    Comprehensive metabolic panel [[id number]]  (Abnormal) Collected:  03/07/17 0529    Specimen:  Blood Updated:  03/07/17 0655     Glucose 94 mg/dL      BUN 78.2 mg/dL      Creatinine 1.1 mg/dL      Sodium 956 (L) mEq/L      Potassium 3.8 mEq/L      Chloride 107 mEq/L      CO2 18 (L) mEq/L      Calcium 7.8 (L) mg/dL      Protein, Total 5.2 (L) g/dL      Albumin 2.4 (L) g/dL      AST (SGOT) 15 U/L      ALT  15 U/L      Alkaline Phosphatase 86 U/L      Bilirubin, Total 0.6 mg/dL      Globulin 2.8 g/dL      Albumin/Globulin Ratio 0.9    CBC and differential [[id number]]  (Abnormal) Collected:  03/07/17 0529    Specimen:  Blood from Blood Updated:  03/07/17 0556     WBC 7.75 x10 3/uL      Hgb 9.8 (L) g/dL      Hematocrit 46.9 (L) %      Platelets 414 (H) x10 3/uL      RBC 3.45 (L) x10 6/uL      MCV 91.0 fL      MCH 28.4 pg      MCHC 31.2 (L) g/dL      RDW 18 (H) %      MPV 10.0 fL      Neutrophils 73.1 %      Lymphocytes Automated 6.5 %      Monocytes 14.2 %      Eosinophils Automated 5.4 %      Basophils Automated 0.3 %      Immature Granulocyte 0.5 %      Nucleated RBC 0.0 /100 WBC      Neutrophils Absolute 5.67 x10 3/uL      Abs Lymph Automated 0.50 x10 3/uL      Abs Mono Automated 1.10 x10 3/uL      Abs  Eos Automated 0.42 x10 3/uL      Absolute Baso Automated 0.02 x10 3/uL      Absolute Immature Granulocyte 0.04 x10 3/uL      Absolute NRBC 0.00 x10 3/uL     Prepare/Crossmatch Red Blood Cells:  Two Units [[id number]] Collected:  03/03/17 1301     Updated:  03/07/17 0044     RBC Leukoreduced RBC Leukoreduced     BLUNIT [id number]     Status transfused     PRODUCT CODE (NON READABLE) E0226V00     Expiration Date [id number]     UTYPE O POS     RBC Leukoreduced RBC Leukoreduced     BLUNIT [id number]     Status transfused     PRODUCT CODE (NON READABLE) E0226V00     Expiration Date [id number]     UTYPE O POS    Prepare/Crossmatch Red Blood Cells:  Two Units [[id number]] Collected:  03/03/17 1301     Updated:  03/07/17 0044     RBC Leukoreduced RBC Leukoreduced     BLUNIT [id number]     Status transfused     PRODUCT CODE (NON READABLE) E4532V00     Expiration Date [id number]     UTYPE O POS     RBC Leukoreduced RBC Leukoreduced     BLUNIT [id number]     Status transfused     PRODUCT CODE (NON READABLE) E4533V00     Expiration Date [id number]     UTYPE O POS    Hemoglobin and hematocrit, blood [[id number]]   (Abnormal) Collected:  03/06/17 1805    Specimen:  Blood Updated:  03/06/17 1820     Hgb 10.2 (L) g/dL      Hematocrit 38.1 (L) %               Rads:   Radiological Procedure reviewed.    Signed by: Schuyler Amor MD  [phone]

## 2017-03-07 NOTE — Discharge Summary (Signed)
DISCHARGE NOTE    Date Time: 03/07/17 1:06 PM  Patient Name: [name],[name]  Attending Physician: Schuyler Amor, MD    Date of Admission:   03/03/2017    Date of Discharge:   03/07/17    Reason for Admission:   Symptomatic anemia [D64.9]  Gastrointestinal hemorrhage, unspecified gastrointestinal hemorrhage type [K92.2]  GI bleed [K92.2]    Problems:   Lists the present on admission hospital problems  Present on Admission:  . GI bleed      Hospital Problems:  Active Problems:    GI bleed      Problem Lists:  Patient Active Problem List   Diagnosis   . GI bleed   . Anemia   . Iron deficiency anemia secondary to inadequate dietary iron intake   . Anemia in other chronic diseases classified elsewhere         Discharge Dx:   Acute anemia due to acute blood loss from GIB  Recent Hx AVM on endoscopy   Severe CAD s/p PCI with DES 02/23/17  Baseline low BP     Consultations:   GI  Cardiology  PT  Hematology   Procedures performed:   PRBC 4 units     Hospital Course:   [name] is a 83 y.o. male who presents to the hospital with abnormally low h/h in out patient labs   He noticed pasty maroon color stools for th epast few days   No cp, SOB, nausea vomiting  or abdominal pain   RES is negative for dizziness or lightheadedness   No hematamasis     Admitted to medicine tele floor 929  Transfused PRBC with h/h improvement to 9.8/31.4  Seen by hematology IV venofer started     plavix and aspirin on hold   Seen by cardiology plavix restarted   No bleeding h/h stable   Seen by GI no plans for endoscopy given recent colonoscopy   Cleared for discharge home with repeat h/h on Thursday   And follow up with PCP and cardiology     Discharge Medications:     Current Discharge Medication List      CONTINUE these medications which have CHANGED    Details   lisinopril (PRINIVIL,ZESTRIL) 5 MG tablet Take 1 tablet (5 mg total) by mouth 2 (two) times daily.  Qty: 60 tablet, Refills: 0         CONTINUE these medications which have NOT  CHANGED    Details   atorvastatin (LIPITOR) 80 MG tablet Take 1 tablet (80 mg total) by mouth nightly.  Qty: 30 tablet, Refills: 1      carvedilol (COREG) 3.125 MG tablet Take 1 tablet (3.125 mg total) by mouth every 12 (twelve) hours.  Qty: 60 tablet, Refills: 0      clopidogrel (PLAVIX) 75 mg tablet Take 1 tablet (75 mg total) by mouth daily.  Qty: 30 tablet, Refills: 1      IRON PO Take 200 mg by mouth daily.      pantoprazole (PROTONIX) 40 MG tablet Take 1 tablet (40 mg total) by mouth 2 (two) times daily.  Qty: 60 tablet, Refills: 0         STOP taking these medications       FUROSEMIDE PO        aspirin 81 MG chewable tablet                Discharge Instructions:       Follow-up with  PCP, cardiology       Signed by: Schuyler Amor  MD  [phone]

## 2017-03-07 NOTE — Progress Notes (Signed)
03/07/17 1529   Discharge Disposition   Patient preference/choice provided? Yes   Physical Discharge Disposition Home   Mode of Transportation Car  (pt.'s daughter will pick up)   Medicare Checklist   Is this a Medicare patient? Yes   Patient received 1st IMM Letter? Yes   If LOS 3 days or greater, did patient received 2nd IMM Letter? Yes     Jaci Carrel. Lajoya Dombek RN BSN   Case Management Department  RN Case Manager I  [facility]  [phone]

## 2017-03-07 NOTE — Progress Notes (Signed)
PROGRESS NOTE    Date Time: 03/07/17 8:33 AM  Patient Name: [name],[name]      Assessment:   1.Anemia  2.CAD S/P  DES (12/5)  3.Afib  4.AVM  5.HTN  Plan:   -Pt is S/P 2 units of PRBC Tx  -Cont with IV Venofer 200 mg IV X 5  -Cont with B12 1000 mcg IM daily for 7 days  -Hb stable   -Off of ASA  -On Plavix  -Will cont to monitor CBC  -Upon [place] pt to see me in 10 days for close monitoring the CBC  Cont plan of care  All labs, notes and past 24 hrs events reviewed   Subjective:   Pt was seen at bedside, feels better today, no N/V/D, no HA or Blurry vision   Medications:     Current Facility-Administered Medications   Medication Dose Route Frequency   . atorvastatin  80 mg Oral QHS   . carvedilol  3.125 mg Oral Q12H SCH   . clopidogrel  75 mg Oral Daily   . cyanocobalamin  1,000 mcg Intramuscular Q24H   . iron sucrose  200 mg Intravenous Q24H   . lisinopril  5 mg Oral Daily   . pantoprazole  40 mg Intravenous BID       Review of Systems:   A comprehensive review of systems was: History obtained from chart review and the patient  General ROS: negative for - chills, fatigue or fever  ENT ROS: negative for - epistaxis or headaches  Hematological and Lymphatic ROS: negative for - bleeding problems or night sweats  Respiratory ROS: no cough, shortness of breath, or wheezing  Cardiovascular ROS: no chest pain or dyspnea on exertion  Gastrointestinal ROS: no abdominal pain, change in bowel habits, or black or bloody stools  Genito-Urinary ROS: no dysuria, trouble voiding, or hematuria  Musculoskeletal ROS: negative for - gait disturbance or joint pain  Neurological ROS: no TIA or stroke symptoms  Dermatological ROS: negative for acne, dry skin, eczema and hair changes    Physical Exam:     Vitals:    03/07/17 0739   BP: 114/58   Pulse: 62   Resp: 16   Temp: 98 F (36.7 C)   SpO2: 99%       Intake and Output Summary (Last 24 hours) at Date Time  No intake or output data in the 24 hours ending 03/07/17 7829    General appearance:  Elderly, well nourished, appears stated age and in NAD  Eyes: Sclera anicteric, pink conjunctivae, no ptosis  ENMT: mucous membranes moist, nose and ears appear normal. Oropharynx clear.  Chest: Non labored respirations, no audible wheezing, no clubbing or cyanosis  CV: Regular rate and rhythm, no JVD, no LE edema  Abdomen: soft, non-tender, non-distended, no masses or organomegaly  Skin: Normal color and turgor, no rashes, no suspicious skin lesions noted  Neuro: CN II-XII grossly intact. No gross movement disorders noted.  Mental status: Appropriate affect, alert and oriented x 3    Labs:     Results     Procedure Component Value Units Date/Time    GFR [[id number]] Collected:  03/07/17 0529     Updated:  03/07/17 0656     EGFR >60.0    Comprehensive metabolic panel [[id number]]  (Abnormal) Collected:  03/07/17 0529    Specimen:  Blood Updated:  03/07/17 0655     Glucose 94 mg/dL      BUN 28.4 mg/dL  Creatinine 1.1 mg/dL      Sodium 161 (L) mEq/L      Potassium 3.8 mEq/L      Chloride 107 mEq/L      CO2 18 (L) mEq/L      Calcium 7.8 (L) mg/dL      Protein, Total 5.2 (L) g/dL      Albumin 2.4 (L) g/dL      AST (SGOT) 15 U/L      ALT 15 U/L      Alkaline Phosphatase 86 U/L      Bilirubin, Total 0.6 mg/dL      Globulin 2.8 g/dL      Albumin/Globulin Ratio 0.9    CBC and differential [[id number]]  (Abnormal) Collected:  03/07/17 0529    Specimen:  Blood from Blood Updated:  03/07/17 0556     WBC 7.75 x10 3/uL      Hgb 9.8 (L) g/dL      Hematocrit 81.1 (L) %      Platelets 414 (H) x10 3/uL      RBC 3.45 (L) x10 6/uL      MCV 91.0 fL      MCH 28.4 pg      MCHC 31.2 (L) g/dL      RDW 18 (H) %      MPV 10.0 fL      Neutrophils 73.1 %      Lymphocytes Automated 6.5 %      Monocytes 14.2 %      Eosinophils Automated 5.4 %      Basophils Automated 0.3 %      Immature Granulocyte 0.5 %      Nucleated RBC 0.0 /100 WBC      Neutrophils Absolute 5.67 x10 3/uL      Abs Lymph Automated 0.50 x10 3/uL      Abs Mono Automated 1.10  x10 3/uL      Abs Eos Automated 0.42 x10 3/uL      Absolute Baso Automated 0.02 x10 3/uL      Absolute Immature Granulocyte 0.04 x10 3/uL      Absolute NRBC 0.00 x10 3/uL     Prepare/Crossmatch Red Blood Cells:  Two Units [[id number]] Collected:  03/03/17 1301     Updated:  03/07/17 0044     RBC Leukoreduced RBC Leukoreduced     BLUNIT [id number]     Status transfused     PRODUCT CODE (NON READABLE) E0226V00     Expiration Date [id number]     UTYPE O POS     RBC Leukoreduced RBC Leukoreduced     BLUNIT [id number]     Status transfused     PRODUCT CODE (NON READABLE) E0226V00     Expiration Date [id number]     UTYPE O POS    Prepare/Crossmatch Red Blood Cells:  Two Units [[id number]] Collected:  03/03/17 1301     Updated:  03/07/17 0044     RBC Leukoreduced RBC Leukoreduced     BLUNIT [id number]     Status transfused     PRODUCT CODE (NON READABLE) E4532V00     Expiration Date [id number]     UTYPE O POS     RBC Leukoreduced RBC Leukoreduced     BLUNIT [id number]     Status transfused     PRODUCT CODE (NON READABLE) E4533V00     Expiration Date [id number]     UTYPE O POS    Hemoglobin and hematocrit, blood [[id number]]  (Abnormal) Collected:  03/06/17 1805  Specimen:  Blood Updated:  03/06/17 1820     Hgb 10.2 (L) g/dL      Hematocrit 72.5 (L) %               Rads:   No results found.  Radiological Procedure reviewed.    Signed by: Sayvon Bellow

## 2017-03-07 NOTE — PT Eval Note (Signed)
[facility]   Physical Therapy Evaluation   Patient: [name]    MRN#: [id number]   Unit: [facility] TOWER 9  Bed: F929/F929.01        Discharge Recommendations:   Discharge Recommendation: Home with supervision   DME Recommended for Discharge:  (None at this time)                                                Discharge recommendations are subject to change based on patient's progress / regression. Please refer to the most recent treatment note for the current recommendation. Thank You!     Assessment:   [name] is a 83 y.o. male admitted 03/03/2017 with anemia.  Pt's functional mobility is impacted by:  decreased activity tolerance.  There are a few comorbidities or other factors that affect plan of care and require modification of task including: has stairs to manage and lives alone.  Standardized tests and exams incorporated into evaluation include cognition/orientation.  Pt demonstrates a stable clinical presentation. Pt able to ambulate 400 ft without LOB, able to pick up object from floor and negotiate 1 FOS with rail and reciprocal gait pattern. Pt with occasional veering L/R with ambulation and scissoring however without LOB. Pt appears to be at/near functional baseline for mobility, [facility] PT services.     Impairments: Assessment: Appears to be at baseline for mobility;Appears to be at baseline for balance.     Rehabilitation Potential: Prognosis: Good    Treatment Activities:  PT Evaluation.   Educated the patient to role of physical therapy, plan of care, goals of therapy and [name] recommendations and safety with mobility.    Plan:   Treatment/Interventions: No skilled interventions needed at this time     PT Frequency: one time visit   Risks/Benefits/POC Discussed with Pt/Family: With patient        Precautions and Contraindications:   Other Precautions:  (None)    Consult received for [name] for PT Evaluation and Treatment.  Patient's medical condition is  appropriate for Physical therapy intervention at this time.    Medical Diagnosis: Symptomatic anemia [D64.9]  Gastrointestinal hemorrhage, unspecified gastrointestinal hemorrhage type [K92.2]  GI bleed [K92.2]      History of Present Illness:   [name] is a 83 y.o. male admitted on 03/03/2017 with anemia        Past Medical/Surgical History:  Past Medical History:   Diagnosis Date   . Anemia     iron low   . DOE (dyspnea on exertion)     mild   . Glaucoma NEC    . Hypertensive disorder    . Lupus 1990   . Skin cancer     2006 removed mole   . Ulcerative colitis, chronic    . Vision abnormalities        Past Surgical History:   Procedure Laterality Date   . CARDIAC SURGERY      3 stents   . EGD, COLONOSCOPY  04/03/2012    Procedure: EGD, COLONOSCOPY;  Surgeon: Lestine Mount, MD;  Location: [facility];  Service: Gastroenterology;  Laterality: N/A;   . EGD, COLONOSCOPY N/A 02/11/2017    Procedure: EGD, COLONOSCOPY;  Surgeon: Lestine Mount, MD;  Location: [facility];  Service: Gastroenterology;  Laterality: N/A;   . EYE  SURGERY      cataract         X-Rays/Tests/Labs:  XR Chest  AP Portable   IMPRESSION:    Interstitial prominence with bibasilar airspace disease and  a probable small left pleural effusion. The findings could be related to  edema in the appropriate clinical setting however infection cannot be  excluded.       Social History:   Prior Level of Function:  Prior level of function: Independent with ADLs, Ambulates independently  Baseline Activity Level: Community ambulation  Driving: does not drive (dgts drive home to appts)  DME Currently at Home:  (None)    Home Living Arrangements:  Living Arrangements: Alone  Type of Home: House  Home Layout: Multi-level, Bed/bath upstairs (1 STE without rail)  DME Currently at Home:  (None)  Home Living - Notes / Comments:  (dgts close to assist as needed)    Subjective:   Patient is agreeable to participation in the therapy session. Nursing clears patient for  therapy.     "Oh no honey, I dont need that."       Pain Assessment  Pain Assessment: No/denies pain    Objective:   Observation of Patient/Vital Signs:  Patient is in bed with telemetry in place.    Observation of Patient/Vital signs:  Inspection/Posture:  (telemetry, PIV access)    Cognition/Neuro Status  Arousal/Alertness: Appropriate responses to stimuli  Attention Span: Appears intact  Orientation Level: Oriented X4  Memory: Appears intact  Following Commands: Follows all commands and directions without difficulty  Safety Awareness: independent  Insights: Fully aware of deficits         Musculoskeletal Examination:  Gross ROM  Neck/Trunk ROM: within functional limits  Right Upper Extremity ROM: within functional limits  Left Upper Extremity ROM: within functional limits  Right Lower Extremity ROM: within functional limits  Left Lower Extremity ROM: within functional limits    Gross Strength  Neck/Trunk Strength: WFL  Right Upper Extremity Strength: within functional limits  Left Upper Extremity Strength: within functional limits  Right Lower Extremity Strength: 3/5 (at least, gross assessment)  Left Lower Extremity Strength: 3/5 (at least, gross assessment)         Functional Mobility:  Supine to Sit: Independent  Scooting to EOB: Independent  Sit to Supine: Independent  Sit to Stand: Independent  Stand to Sit: Independent       PMP - Progressive Mobility Protocol   PMP Activity: Step 7 - Walks out of Room  Distance Walked (ft) (Step 6,7): 400 Feet     Ambulation:  Ambulation: Supervision  Pattern: decreased cadence;decreased step length;scissoring  Stair Management: Stand by Assist;one rail R;alternating pattern  Number of Stairs:  (ascend/descend 1 FOS)     Balance:  Sitting - Static: Good  Sitting - Dynamic: Good  Standing - Static: Good  Standing - Dynamic: Good (picking up object from floor)    Participation and Activity Tolerance:  Participation Effort: good  Endurance: Tolerates 30 min exercise with  multiple rests      Patient left with call bell within reach, SCDs off as found, chair alarm n/a, bed alarm on, fall mats in place as found all needs met and all questions answered, RN notified of session outcome and patient response.    [facility] IP PT services    Time of treatment:   PT Received On: 03/07/17  Start Time: 1400  Stop Time: 1423  Time Calculation (min): 23 min  Clent Jacks PT, DPT   [phone]

## 2017-03-07 NOTE — Discharge Instr - AVS First Page (Signed)
Reason for your Hospital Admission:  Blood loss anemia       Instructions for after your discharge:  CBC lab on Thursday 03/10/17 with DR Smiley [place]

## 2017-03-07 NOTE — Plan of Care (Signed)
Problem: Moderate/High Fall Risk Score >5  Goal: Patient will remain free of falls  Outcome: Progressing   03/06/17 2135   OTHER   Moderate Risk (6-13) MOD-Initiate Yellow "Fall Risk" magnet communication tool;MOD-Use of chair-pad alarm when appropriate;MOD-Consider a move closer to Nurses Station;MOD-Remain with patient during toileting;MOD-Use of assistive devices-bedside commode if appropriate;MOD-Place bedside commode and assistive devices out of sight when not in use;MOD-Place Fall Risk level on whiteboard in room       Problem: Safety  Goal: Patient will be free from injury during hospitalization  Outcome: Progressing   03/07/17 0418   Goal/Interventions addressed this shift   Patient will be free from injury during hospitalization  Assess patient's risk for falls and implement fall prevention plan of care per policy;Use appropriate transfer methods;Provide and maintain safe environment;Include patient/ family/ care giver in decisions related to safety;Assess for patients risk for elopement and implement Elopement Risk Plan per policy     Goal: Patient will be free from infection during hospitalization  Outcome: Progressing   03/07/17 0418   Goal/Interventions addressed this shift   Free from Infection during hospitalization Assess and monitor for signs and symptoms of infection;Monitor lab/diagnostic results       Problem: Discharge Barriers  Goal: Patient will be discharged home or other facility with appropriate resources  Outcome: Progressing   03/07/17 0418   Goal/Interventions addressed this shift   Discharge to home or other facility with appropriate resources Provide appropriate patient education;Provide information on available health resources       Problem: Psychosocial and Spiritual Needs  Goal: Demonstrates ability to cope with hospitalization/illness  Outcome: Progressing   03/07/17 0418   Goal/Interventions addressed this shift   Demonstrates ability to cope with hospitalizations/illness  Encourage verbalization of feelings/concerns/expectations;Provide quiet environment;Include patient/ patient care companion in decisions       Problem: Altered GI Function  Goal: Mobility/Activity is maintained at optimal level for patient  Outcome: Progressing   03/07/17 0418   Goal/Interventions addressed this shift   Mobility/activity is maintained at optimal level for patient Increase mobility as tolerated/progressive mobility;Encourage independent activity per ability;Plan activities to conserve energy, plan rest periods;Reposition patient every 2 hours and as needed unless able to reposition self     Goal: No bleeding  Outcome: Progressing   03/07/17 0418   Goal/Interventions addressed this shift   No bleeding  Monitor and assess vitals and hemodynamic parameters;Monitor/assess lab values and report abnormal values;Assess for bruising/petechia

## 2017-03-07 NOTE — Progress Notes (Signed)
Medicine Shift Note    Patient Lines/Drains/Airways Status    Active Lines, Drains and Airways     Name:   Placement date:   Placement time:   Site:   Days:    Peripheral IV 03/04/17 Right Forearm  03/04/17    0230    Forearm    3                Last BM: 03/06/2017    Pending Orders: no    Discharge Plan: 03/07/2017    POC: patient    Skin: redness on sacrum, scattered brusing    Tele: yes, complex cardiac disorders, Hx A fib    Activity: Stand by assist    Interpreter Needs: no    Shift Note: AOx4. Denies pain or SOB. No respiratory distress noted. No signs and symptoms of GI bleeding.

## 2017-03-07 NOTE — Plan of Care (Signed)
Gave discharge instruction and pt verbalized understanding. Removed IV and no bleeding noted. All belongs were given. Pt left unit by wheel chair.

## 2017-03-07 NOTE — Plan of Care (Signed)
Problem: Safety  Goal: Patient will be free from injury during hospitalization  Outcome: Progressing   03/07/17 1200   Goal/Interventions addressed this shift   Patient will be free from injury during hospitalization  Assess patient's risk for falls and implement fall prevention plan of care per policy;Provide and maintain safe environment;Use appropriate transfer methods;Ensure appropriate safety devices are available at the bedside;Include patient/ family/ care giver in decisions related to safety;Hourly rounding;Assess for patients risk for elopement and implement Elopement Risk Plan per policy       Problem: Discharge Barriers  Goal: Patient will be discharged home or other facility with appropriate resources  Outcome: Progressing   03/07/17 1200   Goal/Interventions addressed this shift   Discharge to home or other facility with appropriate resources Provide appropriate patient education;Provide information on available health resources;Initiate discharge planning       Problem: Psychosocial and Spiritual Needs  Goal: Demonstrates ability to cope with hospitalization/illness  Outcome: Progressing   03/07/17 1200   Goal/Interventions addressed this shift   Demonstrates ability to cope with hospitalizations/illness Encourage verbalization of feelings/concerns/expectations;Provide quiet environment;Encourage patient to set small goals for self;Assist patient to identify own strengths and abilities;Encourage participation in diversional activity;Reinforce positive adaptation of new coping behaviors;Include patient/ patient care companion in decisions;Communicate referral to spiritual care as appropriate       Problem: Altered GI Function  Goal: Mobility/Activity is maintained at optimal level for patient  Outcome: Progressing   03/07/17 1200   Goal/Interventions addressed this shift   Mobility/activity is maintained at optimal level for patient Increase mobility as tolerated/progressive mobility;Encourage  independent activity per ability;Maintain proper body alignment;Perform active/passive ROM;Plan activities to conserve energy, plan rest periods;Reposition patient every 2 hours and as needed unless able to reposition self;Assess for changes in respiratory status, level of consciousness and/or development of fatigue

## 2017-03-22 DIAGNOSIS — K921 Melena: Secondary | ICD-10-CM

## 2017-03-22 HISTORY — DX: Melena: K92.1

## 2017-12-03 ENCOUNTER — Emergency Department: Payer: Medicare Other

## 2017-12-03 ENCOUNTER — Inpatient Hospital Stay
Admission: EM | Admit: 2017-12-03 | Discharge: 2017-12-12 | DRG: 377 | Disposition: A | Discharge status: Home / Self Care | Payer: Medicare Other | Attending: Internal Medicine | Admitting: Internal Medicine

## 2017-12-03 DIAGNOSIS — K5190 Ulcerative colitis, unspecified, without complications: Secondary | ICD-10-CM | POA: Diagnosis present

## 2017-12-03 DIAGNOSIS — D649 Anemia, unspecified: Secondary | ICD-10-CM

## 2017-12-03 DIAGNOSIS — R42 Dizziness and giddiness: Secondary | ICD-10-CM | POA: Diagnosis present

## 2017-12-03 DIAGNOSIS — D7589 Other specified diseases of blood and blood-forming organs: Secondary | ICD-10-CM | POA: Diagnosis present

## 2017-12-03 DIAGNOSIS — D62 Acute posthemorrhagic anemia: Secondary | ICD-10-CM | POA: Diagnosis present

## 2017-12-03 DIAGNOSIS — K31811 Angiodysplasia of stomach and duodenum with bleeding: Principal | ICD-10-CM | POA: Diagnosis present

## 2017-12-03 DIAGNOSIS — I959 Hypotension, unspecified: Secondary | ICD-10-CM | POA: Diagnosis present

## 2017-12-03 DIAGNOSIS — N183 Chronic kidney disease, stage 3 (moderate): Secondary | ICD-10-CM | POA: Diagnosis present

## 2017-12-03 DIAGNOSIS — Z79899 Other long term (current) drug therapy: Secondary | ICD-10-CM

## 2017-12-03 DIAGNOSIS — E538 Deficiency of other specified B group vitamins: Secondary | ICD-10-CM | POA: Diagnosis present

## 2017-12-03 DIAGNOSIS — Z955 Presence of coronary angioplasty implant and graft: Secondary | ICD-10-CM

## 2017-12-03 DIAGNOSIS — R0602 Shortness of breath: Secondary | ICD-10-CM | POA: Diagnosis present

## 2017-12-03 DIAGNOSIS — K228 Other specified diseases of esophagus: Secondary | ICD-10-CM | POA: Diagnosis present

## 2017-12-03 DIAGNOSIS — K922 Gastrointestinal hemorrhage, unspecified: Secondary | ICD-10-CM | POA: Diagnosis present

## 2017-12-03 DIAGNOSIS — I5023 Acute on chronic systolic (congestive) heart failure: Secondary | ICD-10-CM | POA: Diagnosis present

## 2017-12-03 DIAGNOSIS — E785 Hyperlipidemia, unspecified: Secondary | ICD-10-CM | POA: Diagnosis present

## 2017-12-03 DIAGNOSIS — M329 Systemic lupus erythematosus, unspecified: Secondary | ICD-10-CM | POA: Diagnosis present

## 2017-12-03 DIAGNOSIS — I272 Pulmonary hypertension, unspecified: Secondary | ICD-10-CM | POA: Diagnosis present

## 2017-12-03 DIAGNOSIS — K641 Second degree hemorrhoids: Secondary | ICD-10-CM | POA: Diagnosis present

## 2017-12-03 DIAGNOSIS — I2510 Atherosclerotic heart disease of native coronary artery without angina pectoris: Secondary | ICD-10-CM | POA: Diagnosis present

## 2017-12-03 DIAGNOSIS — E8809 Other disorders of plasma-protein metabolism, not elsewhere classified: Secondary | ICD-10-CM | POA: Diagnosis present

## 2017-12-03 DIAGNOSIS — K5520 Angiodysplasia of colon without hemorrhage: Secondary | ICD-10-CM | POA: Diagnosis present

## 2017-12-03 DIAGNOSIS — E875 Hyperkalemia: Secondary | ICD-10-CM | POA: Diagnosis present

## 2017-12-03 DIAGNOSIS — E872 Acidosis: Secondary | ICD-10-CM | POA: Diagnosis present

## 2017-12-03 DIAGNOSIS — Z85828 Personal history of other malignant neoplasm of skin: Secondary | ICD-10-CM

## 2017-12-03 DIAGNOSIS — Z7902 Long term (current) use of antithrombotics/antiplatelets: Secondary | ICD-10-CM

## 2017-12-03 DIAGNOSIS — R040 Epistaxis: Secondary | ICD-10-CM | POA: Diagnosis present

## 2017-12-03 DIAGNOSIS — I130 Hypertensive heart and chronic kidney disease with heart failure and stage 1 through stage 4 chronic kidney disease, or unspecified chronic kidney disease: Secondary | ICD-10-CM | POA: Diagnosis present

## 2017-12-03 DIAGNOSIS — N179 Acute kidney failure, unspecified: Secondary | ICD-10-CM | POA: Diagnosis present

## 2017-12-03 DIAGNOSIS — I480 Paroxysmal atrial fibrillation: Secondary | ICD-10-CM | POA: Diagnosis present

## 2017-12-03 LAB — URINALYSIS, REFLEX TO MICROSCOPIC EXAM IF INDICATED
Bilirubin, UA: NEGATIVE
Blood, UA: NEGATIVE
Glucose, UA: NEGATIVE
Ketones UA: NEGATIVE
Leukocyte Esterase, UA: NEGATIVE
Nitrite, UA: NEGATIVE
Protein, UR: NEGATIVE
Specific Gravity UA: 1.009 (ref 1.001–1.035)
Urine pH: 6 (ref 5.0–8.0)
Urobilinogen, UA: NORMAL mg/dL

## 2017-12-03 LAB — CBC AND DIFFERENTIAL
Absolute NRBC: 0 10*3/uL (ref 0.00–0.00)
Basophils Absolute Automated: 0.02 10*3/uL (ref 0.00–0.08)
Basophils Automated: 0.3 %
Eosinophils Absolute Automated: 0.32 10*3/uL (ref 0.00–0.44)
Eosinophils Automated: 5.6 %
Hematocrit: 21.6 % — ABNORMAL LOW (ref 37.6–49.6)
Hgb: 6.2 g/dL — ABNORMAL LOW (ref 12.5–17.1)
Immature Granulocytes Absolute: 0.02 10*3/uL (ref 0.00–0.07)
Immature Granulocytes: 0.3 %
Lymphocytes Absolute Automated: 0.38 10*3/uL — ABNORMAL LOW (ref 0.42–3.22)
Lymphocytes Automated: 6.6 %
MCH: 30.7 pg (ref 25.1–33.5)
MCHC: 28.7 g/dL — ABNORMAL LOW (ref 31.5–35.8)
MCV: 106.9 fL — ABNORMAL HIGH (ref 78.0–96.0)
MPV: 9.8 fL (ref 8.9–12.5)
Monocytes Absolute Automated: 0.68 10*3/uL (ref 0.21–0.85)
Monocytes: 11.9 %
Neutrophils Absolute: 4.3 10*3/uL (ref 1.10–6.33)
Neutrophils: 75.3 %
Nucleated RBC: 0 /100 WBC (ref 0.0–0.0)
Platelets: 389 10*3/uL — ABNORMAL HIGH (ref 142–346)
RBC: 2.02 10*6/uL — ABNORMAL LOW (ref 4.20–5.90)
RDW: 14 % (ref 11–15)
WBC: 5.72 10*3/uL (ref 3.10–9.50)

## 2017-12-03 LAB — COMPREHENSIVE METABOLIC PANEL
ALT: 13 U/L (ref 0–55)
AST (SGOT): 10 U/L (ref 5–34)
Albumin/Globulin Ratio: 1.1 (ref 0.9–2.2)
Albumin: 3.3 g/dL — ABNORMAL LOW (ref 3.5–5.0)
Alkaline Phosphatase: 194 U/L — ABNORMAL HIGH (ref 38–106)
BUN: 61 mg/dL — ABNORMAL HIGH (ref 9.0–28.0)
Bilirubin, Total: 0.1 mg/dL — ABNORMAL LOW (ref 0.2–1.2)
CO2: 19 mEq/L — ABNORMAL LOW (ref 22–29)
Calcium: 8.2 mg/dL (ref 7.9–10.2)
Chloride: 108 mEq/L (ref 100–111)
Creatinine: 3.1 mg/dL — ABNORMAL HIGH (ref 0.7–1.3)
Globulin: 2.9 g/dL (ref 2.0–3.6)
Glucose: 97 mg/dL (ref 70–100)
Potassium: 4.5 mEq/L (ref 3.5–5.1)
Protein, Total: 6.2 g/dL (ref 6.0–8.3)
Sodium: 137 mEq/L (ref 136–145)

## 2017-12-03 LAB — CROSSMATCH PRBC, 1 UNIT
Expiration Date: 201910112359
Status: TRANSFUSED
UTYPE: O POS

## 2017-12-03 LAB — I-STAT TROPONIN: i-STAT Troponin: 0.01 ng/mL (ref 0.00–0.09)

## 2017-12-03 LAB — TROPONIN I: Troponin I: 0.01 ng/mL (ref 0.00–0.09)

## 2017-12-03 LAB — TYPE AND SCREEN
AB Screen Gel: NEGATIVE
ABO Rh: O POS

## 2017-12-03 LAB — GFR: EGFR: 19.1

## 2017-12-03 MED ORDER — SODIUM CHLORIDE 0.9 % IV BOLUS
500.00 mL | Freq: Once | INTRAVENOUS | Status: AC
Start: 2017-12-03 — End: 2017-12-03
  Administered 2017-12-03: 15:00:00 500 mL via INTRAVENOUS

## 2017-12-03 MED ORDER — PANTOPRAZOLE SODIUM 40 MG IV SOLR
40.00 mg | Freq: Two times a day (BID) | INTRAVENOUS | Status: DC
Start: 2017-12-03 — End: 2017-12-12
  Administered 2017-12-03 – 2017-12-12 (×18): 40 mg via INTRAVENOUS
  Filled 2017-12-03 (×17): qty 40

## 2017-12-03 MED ORDER — ATORVASTATIN CALCIUM 80 MG PO TABS
80.00 mg | ORAL_TABLET | Freq: Every evening | ORAL | Status: DC
Start: 2017-12-03 — End: 2017-12-12
  Administered 2017-12-03 – 2017-12-11 (×9): 80 mg via ORAL
  Filled 2017-12-03 (×9): qty 1

## 2017-12-03 MED ORDER — NALOXONE HCL 0.4 MG/ML IJ SOLN (WRAP)
0.20 mg | INTRAMUSCULAR | Status: DC | PRN
Start: 2017-12-03 — End: 2017-12-12

## 2017-12-03 MED ORDER — ACETAMINOPHEN 325 MG PO TABS
650.00 mg | ORAL_TABLET | ORAL | Status: DC | PRN
Start: 2017-12-03 — End: 2017-12-12

## 2017-12-03 MED ORDER — ACETAMINOPHEN 650 MG RE SUPP
650.00 mg | RECTAL | Status: DC | PRN
Start: 2017-12-03 — End: 2017-12-12

## 2017-12-03 MED ORDER — SODIUM CHLORIDE 0.9 % IV BOLUS
500.00 mL | Freq: Once | INTRAVENOUS | Status: AC
Start: 2017-12-03 — End: 2017-12-03
  Administered 2017-12-03: 19:00:00 500 mL via INTRAVENOUS

## 2017-12-03 NOTE — ED Provider Notes (Signed)
Dr. Wilma Flavin, acting as triage physician, entered orders to expedite patient care.    84 y.o. Male presents with dizziness and hypotension. Pt has h/o UC and often anemic and gets blood transfusions. Daughter notes BP was low today. Denies CP, Abdominal pain, SOB. +fatigue     Wilma Flavin Estil Daft, MD  12/03/17 1358

## 2017-12-03 NOTE — Progress Notes (Signed)
12/03/17 1811   CM Review   Acknowledgment of Outpatient/Observation Observation letter given

## 2017-12-03 NOTE — ED Provider Notes (Signed)
[facility] EMERGENCY DEPARTMENT H&P      Visit date: 12/03/2017      CLINICAL SUMMARY           Diagnosis:    .     Final diagnoses:   Anemia, unspecified type   Gastrointestinal hemorrhage, unspecified gastrointestinal hemorrhage type         MDM Notes:      Patient with known history of ulcerative colitis and anemia requiring transfusion today because of feeling lightheaded dizzy.  On exam his guaiac was positive to be pale but was awake and alert no other obvious findings on my exam.  Anemia of 6.2 today.  Has been consented for blood and blood is been ordered.  Will admit to the hospitalist for further management.         Disposition:         Observation Admit      ED Disposition     ED Disposition Condition Date/Time Comment    Observation  Sat Dec 03, 2017  5:09 PM Admitting Physician: Herbert Moors [[id number]]   Diagnosis: GI bleed [295621]   Estimated Length of Stay: < 2 midnights   Tentative Discharge Plan?: Home or Self Care [1]   Patient Class: Observation [104]                        CLINICAL INFORMATION        HPI:      Chief Complaint: Dizziness and Hypotension  .    [name] is a 84 y.o. male with PMHx HTN, UC, anemia, skin cancer, and lupus who presents with episode of dizziness today. Pt described dizziness as feeling near syncopal. Pt reports having dark stools recently. Pt reports h/o anemia and has gotten transfusions in the past. Pt last transfusion here December 2018.    Denies vomiting, CP, SOB.     History obtained from: Patient          ROS:      Positive and negative ROS elements as per HPI.  All other systems reviewed and negative.      Physical Exam:      Pulse 70  BP (!) 85/48  Resp 18  SpO2 100 %  Temp 97.2 F (36.2 C)    Physical Exam   Constitutional: Oriented to person, place, and time. Appears well-developed and well-nourished. No distress.   HENT:   Head: Normocephalic and atraumatic.   Eyes: EOM are normal. Right eye exhibits no discharge.  Left eye exhibits no discharge. pale conjunctivae.  Neck: Normal range of motion. Neck supple. No tracheal deviation present.   Cardiovascular: Normal rate, regular rhythm, normal heart sounds and intact distal pulses.  Exam reveals no gallop and no friction rub.    No murmur heard.  Pulmonary/Chest: Effort normal and breath sounds normal. No respiratory distress. No wheezes. no rales. no tenderness.   Abdominal: Soft. Bowel sounds are normal. no distension and no mass. There is no tenderness. There is no rebound and no guarding.   Musculoskeletal:   RUE: Normal range of motion. no edema, tenderness or deformity.   LUE: Normal range of motion. no edema, tenderness or deformity.  RLE: Normal range of motion. no edema, tenderness or deformity.  LLE: Normal range of motion. no edema, tenderness or deformity.  Neurological: alert and oriented to person, place, and time.   No focal deficit   Skin: Skin is warm and dry. No rash noted. Not  diaphoretic. No erythema. No pallor.   Psychiatric: normal mood and affect. Behavior is normal.   Nursing note and vitals reviewed.              PAST HISTORY        Primary Care Provider: Philipp Ovens, MD        PMH/PSH:    .     Past Medical History:   Diagnosis Date   . Anemia     iron low   . DOE (dyspnea on exertion)     mild   . Glaucoma NEC    . Hypertensive disorder    . Lupus 1990   . Skin cancer     2006 removed mole   . Ulcerative colitis, chronic    . Vision abnormalities        He has a past surgical history that includes Eye surgery; EGD, COLONOSCOPY (04/03/2012); EGD, COLONOSCOPY (N/A, 02/11/2017); and Cardiac surgery.      Social/Family History:      He reports that he quit smoking about 56 years ago. His smoking use included Cigarettes. He has a 12.00 pack-year smoking history. He has never used smokeless tobacco. He reports that he drinks alcohol. He reports that he does not use drugs.    History reviewed. No pertinent family history.      Listed Medications on  Arrival:    .     Home Medications     Med List Status:  Complete Set By: Marzetta Board, RN at 12/03/2017  3:20 PM                atorvastatin (LIPITOR) 80 MG tablet     Take 1 tablet (80 mg total) by mouth nightly.     clopidogrel (PLAVIX) 75 mg tablet     Take 1 tablet (75 mg total) by mouth daily.     furosemide (LASIX) 40 MG tablet     Take 40 mg by mouth daily     IRON PO     Take 200 mg by mouth daily.     lisinopril (PRINIVIL,ZESTRIL) 5 MG tablet     Take 5 mg by mouth daily     pantoprazole (PROTONIX) 40 MG tablet     Take 40 mg by mouth 2 (two) times daily         Allergies: He has No Known Allergies.            VISIT INFORMATION        Clinical Course in the ED:        Critical Care Time(not including procedures): 35 minutes.   Due to the high risk of critical illness or multi-organ failure at initial presentation and/or during ED course.    System(s) at risk for compromise:  circulatory  Critical Diagnosis:   1. Anemia, unspecified type    2. Gastrointestinal hemorrhage, unspecified gastrointestinal hemorrhage type         The patient was Hypotensive:   No     The patient was Hypoxic:   No     This does not including time spent performing other reported procedures or services.   Critical care time involved full attention to the patient's condition and included:   Review of nursing notes and/or old charts - Yes  Documentation time - Yes  Care, transfer of care, and discharge plans - Yes  Obtaining necessary history from family, EMS, nursing home staff and/or treating physicians - Yes  Review of medications,  allergies, and vital signs - Yes   Consultant collaboration on findings and treatment options - Yes  Ordering, interpreting, and reviewing diagnostic studies/tab tests - Yes              Medications Given in the ED:    .     ED Medication Orders     Start Ordered     Status Ordering Provider    12/03/17 1400 12/03/17 1359  sodium chloride 0.9 % bolus 500 mL  Once     Route: Intravenous  Ordered  Dose: 500 mL     Last MAR action:  Stopped BLOOM, ALLYSON GREER            Procedures:      Procedures      Interpretations:      O2 sat-           saturation: 100 %; Oxygen use: room air; Interpretation: Normal    Radiology -     interpreted by me with the following observations: CXR - no acute cardiopulmonary process  EKG -             interpreted by me: atrial fibrillation at 62. Occasional ectopy, normal axis.  Monitor -         interpreted by me: afib in 70s.                 RESULTS        Lab Results:      Results     Procedure Component Value Units Date/Time    Prepare/Crossmatch Red Blood Cells:  One Unit [[id number]] Collected:  12/03/17 1507     Updated:  12/04/17 0815     RBC Leukoreduced RBC Leukoreduced     BLUNIT [id number]     Status selected     PRODUCT CODE (NON READABLE) E0382V00     Expiration Date [id number]     UTYPE O POS    CBC [[id number]]  (Abnormal) Collected:  12/04/17 0405    Specimen:  Blood from Blood Updated:  12/04/17 0528     WBC 4.97 x10 3/uL      Hgb 6.5 (L) g/dL      Hematocrit 32.3 (L) %      Platelets 270 x10 3/uL      RBC 2.16 (L) x10 6/uL      MCV 100.5 (H) fL      MCH 30.1 pg      MCHC 30.0 (L) g/dL      RDW 17 (H) %      MPV 9.7 fL      Nucleated RBC 0.0 /100 WBC      Absolute NRBC 0.00 x10 3/uL     GFR [[id number]] Collected:  12/04/17 0405     Updated:  12/04/17 0520     EGFR 26.9    Comprehensive metabolic panel [[id number]]  (Abnormal) Collected:  12/04/17 0405    Specimen:  Blood Updated:  12/04/17 0520     Glucose 88 mg/dL      BUN 28.3 (H) mg/dL      Creatinine 2.3 (H) mg/dL      Sodium 151 mEq/L      Potassium 4.8 mEq/L      Chloride 113 (H) mEq/L      CO2 17 (L) mEq/L      Calcium 7.9 mg/dL      Protein, Total 5.1 (L) g/dL      Albumin 2.7 (L) g/dL  AST (SGOT) 10 U/L      ALT 13 U/L      Alkaline Phosphatase 158 (H) U/L      Bilirubin, Total 0.6 mg/dL      Globulin 2.4 g/dL      Albumin/Globulin Ratio 1.1    Prepare/Crossmatch Red Blood Cells:  One Unit  [[id number]] Collected:  12/03/17 1507     Updated:  12/04/17 0043     RBC Leukoreduced RBC Leukoreduced     BLUNIT [id number]     Status transfused     PRODUCT CODE (NON READABLE) E0336V00     Expiration Date [id number]     Wetzel [place] POS    Troponin I [[id number]] Collected:  12/03/17 2336    Specimen:  Blood Updated:  12/04/17 0026     Troponin I 0.02 ng/mL     Troponin I [[id number]] Collected:  12/03/17 2023    Specimen:  Blood Updated:  12/03/17 2058     Troponin I 0.01 ng/mL     UA, Reflex to Microscopic (pts  3 + yrs) [[id number]] Collected:  12/03/17 1750    Specimen:  Urine Updated:  12/03/17 1809     Urine Type Clean Catch     Color, UA Straw     Clarity, UA Clear     Specific Gravity UA 1.009     Urine pH 6.0     Leukocyte Esterase, UA Negative     Nitrite, UA Negative     Protein, UR Negative     Glucose, UA Negative     Ketones UA Negative     Urobilinogen, UA Normal mg/dL      Bilirubin, UA Negative     Blood, UA Negative    Type and Screen [[id number]] Collected:  12/03/17 1507    Specimen:  Blood Updated:  12/03/17 1623     ABO Rh O POS     AB Screen Gel NEG    Comprehensive metabolic panel [[id number]]  (Abnormal) Collected:  12/03/17 1507    Specimen:  Blood Updated:  12/03/17 1537     Glucose 97 mg/dL      BUN 95.1 (H) mg/dL      Creatinine 3.1 (H) mg/dL      Sodium 884 mEq/L      Potassium 4.5 mEq/L      Chloride 108 mEq/L      CO2 19 (L) mEq/L      Calcium 8.2 mg/dL      Protein, Total 6.2 g/dL      Albumin 3.3 (L) g/dL      AST (SGOT) 10 U/L      ALT 13 U/L      Alkaline Phosphatase 194 (H) U/L      Bilirubin, Total 0.1 (L) mg/dL      Globulin 2.9 g/dL      Albumin/Globulin Ratio 1.1    GFR [[id number]] Collected:  12/03/17 1507     Updated:  12/03/17 1537     EGFR 19.1    i-Stat Troponin [[id number]] Collected:  12/03/17 1519     Updated:  12/03/17 1537     i-STAT Troponin 0.01 ng/mL     CBC with differential [[id number]]  (Abnormal) Collected:  12/03/17 1506    Specimen:  Blood from Blood Updated:   12/03/17 1526     WBC 5.72 x10 3/uL      Hgb 6.2 (L) g/dL      Hematocrit 70.6 (L) %      Platelets  389 (H) x10 3/uL      RBC 2.02 (L) x10 6/uL      MCV 106.9 (H) fL      MCH 30.7 pg      MCHC 28.7 (L) g/dL      RDW 14 %      MPV 9.8 fL      Neutrophils 75.3 %      Lymphocytes Automated 6.6 %      Monocytes 11.9 %      Eosinophils Automated 5.6 %      Basophils Automated 0.3 %      Immature Granulocyte 0.3 %      Nucleated RBC 0.0 /100 WBC      Neutrophils Absolute 4.30 x10 3/uL      Abs Lymph Automated 0.38 (L) x10 3/uL      Abs Mono Automated 0.68 x10 3/uL      Abs Eos Automated 0.32 x10 3/uL      Absolute Baso Automated 0.02 x10 3/uL      Absolute Immature Granulocyte 0.02 x10 3/uL      Absolute NRBC 0.00 x10 3/uL               Radiology Results:      XR Chest  AP Portable   Final Result       No visible acute cardiopulmonary abnormality. Mild cardiomegaly.      Adela Glimpse, MD    12/03/2017 2:35 PM                  Scribe Attestation:      I was acting as a Neurosurgeon for [employer], MD on [name],[name]  Larwance Rote    I am the first provider for this patient and I personally performed the services documented. Larwance Rote is scribing for me on [name],[name]. This note accurately reflects work and decisions made by me.  Izetta Dakin, MD            Izetta Dakin, MD  12/04/17 [phone]

## 2017-12-03 NOTE — ED Notes (Signed)
NURSING NOTE FOR THE RECEIVING INPATIENT NURSE     ED [place], [place]   [facility] [address]   ED CHARGE NURSE [phone]   ADMISSION INFORMATION   [name] is a 84 y.o. male admitted with a diagnosis of:    1. Anemia, unspecified type    2. Gastrointestinal hemorrhage, unspecified gastrointestinal hemorrhage type       Isolation:  None  Place of residence / Living situation:  Home Independent   NURSING CARE   Mental Status alert and oriented   ADL ADLs:            Independent with all ADLs  Ambulation:  No difficulty   Pertinent Information  and Safety Concerns Has been dizzy when standing up.       VITAL SIGNS     Time of Last Set of Vitals 1530   Temperature 97.7   BP 95/45   Pulse 62   Respirations 21   Pulse OX 100% RA        IV LINES     Peripheral IV 12/03/17 Right Antecubital (Active)   Placement Date/Time: 12/03/17 1508   Size (Gauge): 18 G  Orientation: Right  Location: Antecubital  Site Prep: Chlorhexidine   Patient Prep: Comfort measures  Local Anesthetic: None  Inserted by: Suzette Battiest, EMT  Insertion attempts: 1  Patient Tolerance...        LAB RESULTS     Labs Reviewed   CBC AND DIFFERENTIAL - Abnormal; Notable for the following:        Result Value    Hgb 6.2 (*)     Hematocrit 21.6 (*)     Platelets 389 (*)     RBC 2.02 (*)     MCV 106.9 (*)     MCHC 28.7 (*)     Abs Lymph Automated 0.38 (*)     All other components within normal limits   COMPREHENSIVE METABOLIC PANEL - Abnormal; Notable for the following:     BUN 61.0 (*)     Creatinine 3.1 (*)     CO2 19 (*)     Albumin 3.3 (*)     Alkaline Phosphatase 194 (*)     Bilirubin, Total 0.1 (*)     All other components within normal limits   GFR   URINALYSIS, REFLEX TO MICROSCOPIC EXAM IF INDICATED   I-STAT TROPONIN   TYPE AND SCREEN   CROSSMATCH PRBC, 1 UNIT

## 2017-12-03 NOTE — Progress Notes (Signed)
12/03/17 1901   Provider Notification   Reason for Communication Evaluate   Provider Name Herbert Moors MD   Provider Role Attending physician   Method of Communication Page;Call   Readback Results Yes   Response Other (Comment)   Notification Time 1903   Shift Event Other       Clear liquid diet

## 2017-12-03 NOTE — H&P (Signed)
ADMISSION HISTORY AND PHYSICAL EXAM    Date Time: 12/03/17 7:06 PM  Patient Name: [name],[name]  Attending Physician: Herbert Moors, MD  Primary Care Physician: Philipp Ovens, MD    CC: Passing black stool      Assessment:   GI bleed, possible upper GI. H/o angiectasia stomach, duodenum and colon  Dizziness  Paroxysmal A.fib, not on AC. GI bleed  H/o angiectasis  Hypotension  H/o Hypertension  Anemia, acute on chronic, blood loss  Macrocytosis  Hyperlipidemia  H/o SLE  Dizziness  Renal failure, acute on chronic, GI bleed  Metabolic acidosis    Plan:   Admit to Tele. Monitor vitals  Type and screen  Transfuse as needed  GI consult  Protonix  Hydrate  Monitor renal function  Renal consult  Hold Plavix  Rheumatology and Hematology consult   PT and OT  Activity as tolerated  D/c when stable      Disposition:     Today's date: 12/03/2017  Admit Date: 12/03/2017  2:04 PM  Anticipated medical stability for discharge:Red - not tomorrow - estimated month/date: 12/07/2017  Service status: Inpatient: risk of progressive disease  Reason for ongoing hospitalization: GI bleed, symptomatic anemia, Renal failure  Anticipated discharge needs: Home health care    History of Presenting Illness:   [name] is a 84 y.o. male who presents to the hospital with the C/o dizziness and passing black colored stool. Patient had EGD and Colonoscopy in 2018. Found to have angiectasia. No pain abdomen.    Past Medical History:     Past Medical History:   Diagnosis Date   . Anemia     iron low   . DOE (dyspnea on exertion)     mild   . Glaucoma NEC    . Hypertensive disorder    . Lupus 1990   . Skin cancer     2006 removed mole   . Ulcerative colitis, chronic    . Vision abnormalities        Available old records reviewed, including:  EPIC    Past Surgical History:     Past Surgical History:   Procedure Laterality Date   . CARDIAC SURGERY      3 stents   . EGD, COLONOSCOPY  04/03/2012    Procedure: EGD, COLONOSCOPY;  Surgeon: Lestine Mount, MD;  Location: [facility];  Service: Gastroenterology;  Laterality: N/A;   . EGD, COLONOSCOPY N/A 02/11/2017    Procedure: EGD, COLONOSCOPY;  Surgeon: Lestine Mount, MD;  Location: [id number];  Service: Gastroenterology;  Laterality: N/A;   . EYE SURGERY      cataract       Family History:   Non contributory    Social History:     History   Smoking Status   . Former Smoker   . Packs/day: 1.00   . Years: 12.00   . Types: Cigarettes   . Quit date: 12/18/1961   Smokeless Tobacco   . Never Used     History   Alcohol Use   . 0.0 oz/week     Comment: 0.5oz/d     History   Drug Use No       Allergies:   No Known Allergies    Medications:     Current Discharge Medication List      CONTINUE these medications which have NOT CHANGED    Details   furosemide (LASIX) 40 MG tablet Take 40 mg by mouth daily  lisinopril (PRINIVIL,ZESTRIL) 5 MG tablet Take 5 mg by mouth daily      pantoprazole (PROTONIX) 40 MG tablet Take 40 mg by mouth 2 (two) times daily      atorvastatin (LIPITOR) 80 MG tablet Take 1 tablet (80 mg total) by mouth nightly.  Qty: 30 tablet, Refills: 1      clopidogrel (PLAVIX) 75 mg tablet Take 1 tablet (75 mg total) by mouth daily.  Qty: 30 tablet, Refills: 1      IRON PO Take 200 mg by mouth daily.               Method by which medications were confirmed on admission: EPIC    Review of Systems:   All other systems were reviewed and are negative except: dizziness, passing black stool, no fever,no cough, no chest pain, no headache,no palpitation, no distention of abdomen,no sob    Physical Exam:   Patient Vitals for the past 24 hrs:   BP Temp Temp src Pulse Resp SpO2 Height Weight   12/03/17 1824 (!) 85/39 97.7 F (36.5 C) Oral (!) 59 15 100 % - -   12/03/17 1730 95/45 97.7 F (36.5 C) Oral 62 (!) 23 100 % - -   12/03/17 1700 93/45 - - (!) 59 19 100 % - -   12/03/17 1643 92/45 - - 60 18 100 % - -   12/03/17 1630 92/45 - - (!) 58 21 100 % - -   12/03/17 1600 102/48 - - (!) 59 17 100 % - -    12/03/17 1510 91/42 - - 61 18 99 % - -   12/03/17 1340 (!) 85/48 97.2 F (36.2 C) Temporal Art 70 18 100 % 1.651 m (5\' 5" ) 59 kg (130 lb)     Body mass index is 21.63 kg/m.  No intake or output data in the 24 hours ending 12/03/17 1906    General: awake, alert, oriented x 3; no acute distress.  HEENT: perrla, eomi, sclera anicteric  oropharynx clear without lesions, mucous membranes moist  Neck: supple, no lymphadenopathy, no thyromegaly, no JVD, no carotid bruits  Cardiovascular: regular rate and rhythm, no murmurs, rubs or gallops  Lungs: clear to auscultation bilaterally, without wheezing, rhonchi, or rales  Abdomen: soft, non-tender, non-distended; no palpable masses, no hepatosplenomegaly, normoactive bowel sounds, no rebound or guarding  Extremities: no clubbing, cyanosis, but mild edema  Neuro: cranial nerves grossly intact, strength 5/5 in upper and lower extremities, sensation intact,   Skin: no rashes or lesions noted        Labs:     Results     Procedure Component Value Units Date/Time    UA, Reflex to Microscopic (pts  3 + yrs) [[id number]] Collected:  12/03/17 1750    Specimen:  Urine Updated:  12/03/17 1809     Urine Type Clean Catch     Color, UA Straw     Clarity, UA Clear     Specific Gravity UA 1.009     Urine pH 6.0     Leukocyte Esterase, UA Negative     Nitrite, UA Negative     Protein, UR Negative     Glucose, UA Negative     Ketones UA Negative     Urobilinogen, UA Normal mg/dL      Bilirubin, UA Negative     Blood, UA Negative    Prepare/Crossmatch Red Blood Cells:  One Unit [[id number]] Collected:  12/03/17 1507     Updated:  12/03/17 1721     RBC Leukoreduced RBC Leukoreduced     BLUNIT [id number]     Status selected     PRODUCT CODE (NON READABLE) [id number]     Expiration Date [id number]     UTYPE O POS    Type and Screen [[id number]] Collected:  12/03/17 1507    Specimen:  Blood Updated:  12/03/17 1623     ABO Rh O POS     AB Screen Gel NEG    Comprehensive metabolic panel  [[id number]]  (Abnormal) Collected:  12/03/17 1507    Specimen:  Blood Updated:  12/03/17 1537     Glucose 97 mg/dL      BUN 13.2 (H) mg/dL      Creatinine 3.1 (H) mg/dL      Sodium 440 mEq/L      Potassium 4.5 mEq/L      Chloride 108 mEq/L      CO2 19 (L) mEq/L      Calcium 8.2 mg/dL      Protein, Total 6.2 g/dL      Albumin 3.3 (L) g/dL      AST (SGOT) 10 U/L      ALT 13 U/L      Alkaline Phosphatase 194 (H) U/L      Bilirubin, Total 0.1 (L) mg/dL      Globulin 2.9 g/dL      Albumin/Globulin Ratio 1.1    GFR [[id number]] Collected:  12/03/17 1507     Updated:  12/03/17 1537     EGFR 19.1    i-Stat Troponin [[id number]] Collected:  12/03/17 1519     Updated:  12/03/17 1537     i-STAT Troponin 0.01 ng/mL     CBC with differential [[id number]]  (Abnormal) Collected:  12/03/17 1506    Specimen:  Blood from Blood Updated:  12/03/17 1526     WBC 5.72 x10 3/uL      Hgb 6.2 (L) g/dL      Hematocrit 33.2 (L) %      Platelets 389 (H) x10 3/uL      RBC 2.02 (L) x10 6/uL      MCV 106.9 (H) fL      MCH 30.7 pg      MCHC 28.7 (L) g/dL      RDW 14 %      MPV 9.8 fL      Neutrophils 75.3 %      Lymphocytes Automated 6.6 %      Monocytes 11.9 %      Eosinophils Automated 5.6 %      Basophils Automated 0.3 %      Immature Granulocyte 0.3 %      Nucleated RBC 0.0 /100 WBC      Neutrophils Absolute 4.30 x10 3/uL      Abs Lymph Automated 0.38 (L) x10 3/uL      Abs Mono Automated 0.68 x10 3/uL      Abs Eos Automated 0.32 x10 3/uL      Absolute Baso Automated 0.02 x10 3/uL      Absolute Immature Granulocyte 0.02 x10 3/uL      Absolute NRBC 0.00 x10 3/uL           Imaging personally reviewed, including: XR Chest AP Portable 12/03/2017  No visible acute cardiopulmonary abnormality. Mild cardiomegaly    ECG, 12/03/2017  A.fib, no acute changes      Safety Checklist  DVT prophylaxis:  CHEST guideline (See page e199S) Mechanical  Foley:  [place] Rn Foley protocol Not present   IVs:  Peripheral IV   PT/OT: Ordered   Daily CBC & or Chem  ordered:  SHM/ABIM guidelines (see #5) Yes, due to clinical and lab instability   Reference for approximate charges of common labs: CBC auto diff - $76  BMP - $99  Mg - $79    Signed by: Herbert Moors, MD   VW:[facility], Trish Fountain, MD

## 2017-12-04 LAB — COMPREHENSIVE METABOLIC PANEL
ALT: 13 U/L (ref 0–55)
AST (SGOT): 10 U/L (ref 5–34)
Albumin/Globulin Ratio: 1.1 (ref 0.9–2.2)
Albumin: 2.7 g/dL — ABNORMAL LOW (ref 3.5–5.0)
Alkaline Phosphatase: 158 U/L — ABNORMAL HIGH (ref 38–106)
BUN: 52 mg/dL — ABNORMAL HIGH (ref 9.0–28.0)
Bilirubin, Total: 0.6 mg/dL (ref 0.2–1.2)
CO2: 17 mEq/L — ABNORMAL LOW (ref 22–29)
Calcium: 7.9 mg/dL (ref 7.9–10.2)
Chloride: 113 mEq/L — ABNORMAL HIGH (ref 100–111)
Creatinine: 2.3 mg/dL — ABNORMAL HIGH (ref 0.7–1.3)
Globulin: 2.4 g/dL (ref 2.0–3.6)
Glucose: 88 mg/dL (ref 70–100)
Potassium: 4.8 mEq/L (ref 3.5–5.1)
Protein, Total: 5.1 g/dL — ABNORMAL LOW (ref 6.0–8.3)
Sodium: 139 mEq/L (ref 136–145)

## 2017-12-04 LAB — GFR: EGFR: 26.9

## 2017-12-04 LAB — CBC
Absolute NRBC: 0 10*3/uL (ref 0.00–0.00)
Hematocrit: 21.7 % — ABNORMAL LOW (ref 37.6–49.6)
Hgb: 6.5 g/dL — ABNORMAL LOW (ref 12.5–17.1)
MCH: 30.1 pg (ref 25.1–33.5)
MCHC: 30 g/dL — ABNORMAL LOW (ref 31.5–35.8)
MCV: 100.5 fL — ABNORMAL HIGH (ref 78.0–96.0)
MPV: 9.7 fL (ref 8.9–12.5)
Nucleated RBC: 0 /100 WBC (ref 0.0–0.0)
Platelets: 270 10*3/uL (ref 142–346)
RBC: 2.16 10*6/uL — ABNORMAL LOW (ref 4.20–5.90)
RDW: 17 % — ABNORMAL HIGH (ref 11–15)
WBC: 4.97 10*3/uL (ref 3.10–9.50)

## 2017-12-04 LAB — CROSSMATCH PRBC, 1 UNIT
Expiration Date: 201909272359
Status: TRANSFUSED
UTYPE: O POS

## 2017-12-04 LAB — TROPONIN I: Troponin I: 0.02 ng/mL (ref 0.00–0.09)

## 2017-12-04 LAB — HEMOGLOBIN AND HEMATOCRIT, BLOOD
Hematocrit: 25.9 % — ABNORMAL LOW (ref 37.6–49.6)
Hgb: 7.9 g/dL — ABNORMAL LOW (ref 12.5–17.1)

## 2017-12-04 MED ORDER — SODIUM CHLORIDE 0.9 % IV SOLN
INTRAVENOUS | Status: DC | PRN
Start: 2017-12-04 — End: 2017-12-12

## 2017-12-04 NOTE — Progress Notes (Addendum)
[facility] ADMISSION    Admitted from: ED, via: stretcher, family: none    Originally stays at: Live alone @home     Orientation: 936, Call bell: Within reach    Belongings: Clothes, shoes, key, and correctional eyeglasses    4 Eyes/Dual Skin Assessment: Burman Freestone, Charge RN    Skin Assess findings: Pale. (B)UE ecchymosis. Varicose veins in (B)LE. Blacheable erythema in gluteal cleft      Wound care Consult: No    Admission Note:    Pt a&o X4.  Hypotensive but VS otherwise stable.  NS bolus X1 given.  SB-SR on telemetry.  BS CTA.  Encouraged turn, cough, and deep breathing.  H/H-6.2/21.6 in ED.  1 Unit PRBC transfused overnight.  Continent of bowel and bladder.  Reported LBM on 12/03/17 (black, tarry stool per pt).   On scheduled Protonix IV BID.  Denies dizziness/lightheadedness, SOB, and generalized weakness @this  time.  Pt ambulating with contact guard assistance.  Pt oriented to unit, room, and call bell.  All high fall risk interventions initiated.  All questions and concerns addressed.  Awaiting AM lab results.

## 2017-12-04 NOTE — Consults (Signed)
[facility]  CONSULTATION NOTE  [facility] call: [phone], [phone]  [facility] call: [phone]  After hours call [phone]        Date Time: 12/04/17 12:38 PM  Patient Name: [name]  Requesting Physician: Herbert Moors, MD       Reason for Consultation:   GI bleed    Assessment and Plan:   Assessment:  1.GI bleed presenting as dark stools, hemoccult positive in ED, in setting of Plavix and PO iron.  Patient reports intermittently dark stools for the past 6 months.  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 Current bleeding likely from AVMs, exacerbated in setting of Plavix.  2. Acute on chronic, blood loss anemia, likely contributed from above  3. CAD s/p DES on Plavix, last dose 12/03/17  4. A fib    Plan:  1. Follow H&H and continue to monitor for overt GI bleeding  2. Transfuse PRBCs as appropriate  3. Continue Protonix  4. OK for regular diet  5. Push enteroscopy once Plavix has been held for 5 days    History:   [name] is an 84 y.o. male with a PMH of anemia, prior GI bleeds, AVMs, A fib, CAD with recent DES placement (12/4) on Plavix who presents to the hospital 9/14 with symptomatic anemia.  Hgb found to be 6.2.  It was 9.8 in December 2018.  He states his stools have been intermittently dark over the past 6 months but no hematochezia.  Last Plavix was yesterday.  Denies any N/V or abdominal pain.    Daughter at bedside.    Past Medical History:     Past Medical History:   Diagnosis Date   . Anemia     iron low   . DOE (dyspnea on exertion)     mild   . Glaucoma NEC    . Hypertensive disorder    . Lupus 1990   . Skin cancer     2006 removed mole   . Ulcerative colitis, chronic    . Vision abnormalities        Past Surgical History:     Past Surgical History:   Procedure Laterality Date   . CARDIAC SURGERY      3 stents   . EGD, COLONOSCOPY   04/03/2012    Procedure: EGD, COLONOSCOPY;  Surgeon: Lestine Mount, MD;  Location: [id number];  Service: Gastroenterology;  Laterality: N/A;   . EGD, COLONOSCOPY N/A 02/11/2017    Procedure: EGD, COLONOSCOPY;  Surgeon: Lestine Mount, MD;  Location: [facility];  Service: Gastroenterology;  Laterality: N/A;   . EYE SURGERY      cataract       Family History:   History reviewed. No pertinent family history.    Social History:     Social History     Social History   . Marital status: Married     Spouse name: N/A   . Number of children: N/A   . Years of education: N/A     Social History Main Topics   . Smoking status: Former Smoker     Packs/day: 1.00     Years: 12.00     Types: Cigarettes     Quit date: 12/18/1961   . Smokeless tobacco: Never Used   . Alcohol use 0.0 oz/week      Comment: 0.5oz/d   . Drug use:  No   . Sexual activity: Not on file     Other Topics Concern   . Not on file     Social History Narrative   . No narrative on file       Allergies:   No Known Allergies    Medications:     Current Facility-Administered Medications   Medication Dose Route Frequency   . atorvastatin  80 mg Oral QHS   . pantoprazole  40 mg Intravenous Q12H [facility]       Review of Systems:   General:  Patient denies lack of appetite, night sweats, weight loss, fatigue, fever.   HEENT:  Patient denies headache, hoarseness   Cardiovascular:  Patient denies swelling of hands/feet, fainting/blacking out, chest pain.   Respiratory:  Patient denies chronic cough, difficulty breathing, wheezing.   Genitourinary:  Patient denies blood in urine, dark urine  Musculoskeletal: Patient denies joint pain, joint stiffness, joint swelling.   Skin:  Patient denies itching, rash.   Neurologic:  Patient denies dizziness, loss of consciousness, fainting, confusion  Heme/Lymphatic:  Patient denies easy bruising.       Pertinent positives noted in HPI.    Physical Exam:     Vitals:    12/04/17 1231   BP: 92/44   Pulse: (!) 52   Resp: 18   Temp: 97.7 F  (36.5 C)   SpO2: 100%       General appearance: Well developed, well nourished, appears stated age and in NAD  Eyes: Sclera anicteric, pink conjunctivae, no ptosis  ENMT: mucous membranes moist, nose and ears appear normal.  Oropharynx clear.  Chest: Non labored respirations, no audible wheezing, no clubbing or cyanosis  CV:  Regular rate and rhythm, no JVD, no LE edema  Abdomen: soft, non-tender, non-distended, no masses or organomegaly  Skin: Normal color and turgor, no rashes, no suspicious skin lesions noted  Neuro: CN II-XII grossly intact.  No gross movement disorders noted.  Mental status: Appropriate affect, alert and oriented x 3    Labs Reviewed:     Recent Labs      12/04/17   0405  12/03/17   1506   WBC  4.97  5.72   Hgb  6.5*  6.2*   Hematocrit  21.7*  21.6*   Platelets  270  389*   MCV  100.5*  106.9*       Recent Labs      12/04/17   0405  12/03/17   1507   Sodium  139  137   Potassium  4.8  4.5   Chloride  113*  108   CO2  17*  19*   BUN  52.0*  61.0*   Creatinine  2.3*  3.1*   Glucose  88  97   Calcium  7.9  8.2       Recent Labs      12/04/17   0405  12/03/17   1507   AST (SGOT)  10  10   ALT  13  13   Alkaline Phosphatase  158*  194*   Bilirubin, Total  0.6  0.1*   Protein, Total  5.1*  6.2   Albumin  2.7*  3.3*       No results for input(s): PTT, PT, INR in the last 72 hours.     Radiology:   Radiological Procedure reviewed:      Endoscopy:     Colon 02/11/17: hemorrhoids, few large patchy angioectasias with bleeding in  cecum (+coag with argon plasma)    EGD 02/11/17: few non-bleeding angioectasias in stomach (+argon beam coag), few angioectasias in duodenum and D2 (+ABC)    Colonoscopy 04/03/12:Angioectasia/AVM found in the ascending colon. Argon beam coagulation was applied to control bleeding. Hemorrhoids found.    EGD 04/03/12:Areas of angioectasia/AVM in the body of the stomach. Argon beam coagulation was applied to control bleeding. An ulcer was found in the prepyloric region. Areas of  angioectasia/AVM were found in the duodenal bulb and 2nd portion of the duodenum.    Colonoscopy 07/21/11:Angioectasia/AVM found in the cecum, ascending colon and distal transverse colon. Argon beam coagulation used to control bleeding. Severe diverticulosis in the sigmoid colon. Internal grade II hemorrhoids.     EGD 07/21/11:Multiple AVMs in the hiatal hernia treated with APC cautery. Areas of angioectasia/AVM were found in the body of the stomach and cardia. Argon beam coagulation was applied to control bleeding. Areas of angioectasia/AVM were found in the 2nd portion of the duodenum and 3rd portion of the duodenum. Argon beam coagulation was applied to control bleeding.

## 2017-12-04 NOTE — Plan of Care (Signed)
Problem: Moderate/High Fall Risk Score >5  Goal: Patient will remain free of falls  Outcome: Progressing   12/04/17 0800   OTHER   High (Greater than 13) HIGH-Activate bed/chair exit alarm where available;HIGH-Apply yellow "Fall Risk" arm band;HIGH-Initiate use of floor mats as appropriate;HIGH-Consider use of low bed;HIGH-(VH Only) Place red fall prevention sign on door/chart;HIGH-(VH Only) Place red star on assignment board;HIGH-(VH Only) Yellow slippers;HIGH-(VH Only) Place "Reset Bed Alarm" sign above bed if in use;HIGH-(VH Only) Keep door open for better visability;HIGH-(VH Only) Plan with CNA for priority hourly rounding;HIGH-(VH Only) Consider Safety CNA       Problem: Safety  Goal: Patient will be free from injury during hospitalization  Outcome: Progressing   12/04/17 0051   Goal/Interventions addressed this shift   Patient will be free from injury during hospitalization  Assess patient's risk for falls and implement fall prevention plan of care per policy;Provide and maintain safe environment;Use appropriate transfer methods;Ensure appropriate safety devices are available at the bedside;Include patient/ family/ care giver in decisions related to safety;Hourly rounding;Assess for patients risk for elopement and implement Elopement Risk Plan per policy     Goal: Patient will be free from infection during hospitalization  Outcome: Progressing   12/04/17 0051   Goal/Interventions addressed this shift   Free from Infection during hospitalization Assess and monitor for signs and symptoms of infection;Monitor lab/diagnostic results;Monitor all insertion sites (i.e. indwelling lines, tubes, urinary catheters, and drains);Encourage patient and family to use good hand hygiene technique       Problem: Discharge Barriers  Goal: Patient will be discharged home or other facility with appropriate resources  Outcome: Progressing   12/04/17 0051   Goal/Interventions addressed this shift   Discharge to home or other  facility with appropriate resources Provide appropriate patient education;Provide information on available health resources;Initiate discharge planning       Problem: Psychosocial and Spiritual Needs  Goal: Demonstrates ability to cope with hospitalization/illness  Outcome: Progressing   12/04/17 0051   Goal/Interventions addressed this shift   Demonstrates ability to cope with hospitalizations/illness Encourage verbalization of feelings/concerns/expectations;Provide quiet environment;Assist patient to identify own strengths and abilities;Encourage patient to set small goals for self;Encourage participation in diversional activity;Reinforce positive adaptation of new coping behaviors;Include patient/ patient care companion in decisions;Communicate referral to spiritual care as appropriate       Problem: Compromised Hemodynamic Status  Goal: Vital signs and fluid balance maintained/improved  Outcome: Progressing   12/04/17 0051   Goal/Interventions addressed this shift   Vital signs and fluid balance are maintained/improved Position patient for maximum circulation/cardiac output;Monitor/assess vitals and hemodynamic parameters with position changes;Monitor intake and output. Notify LIP if urine output is less than 30 mL/hour.;Monitor/assess lab values and report abnormal values       Problem: Activity Intolerance /Fatigue  Goal: Fatigue Management  Outcome: Progressing   12/04/17 0051   Goal/Interventions addressed this shift   Fatigue management  Provide assistance with self-care as needed;Encourage relaxation techniques;Energy management;Monitor and assess vital signs and oxygen saturation;Monitor hemoglobin and hematocrit;Increase activities as tolerated/progressive mobility

## 2017-12-04 NOTE — Progress Notes (Signed)
12/04/17 0610   Provider Notification   Reason for Communication Other (Comment)  (Post transfusion H/H 6.5/21.7)   Provider Name Dr. Georga Bora   Provider Role Attending physician   Method of Communication Page;Call   Response No new orders   Notification Time [phone]

## 2017-12-04 NOTE — Progress Notes (Signed)
MEDICINE PROGRESS NOTE    Date Time: 12/04/17 8:27 PM  Patient Name: [name],[name]  Attending Physician: Herbert Moors, MD    Assessment:   GI bleed, possible upper GI. H/o angiectasia stomach, duodenum and colon  Dizziness  Paroxysmal A.fib, not on AC. GI bleed  H/o angiectasis  Hypotension  H/o Hypertension  Anemia, acute on chronic, blood loss  Macrocytosis  Hyperlipidemia  H/o SLE  Dizziness  Renal failure, acute on chronic, GI bleed  Metabolic acidosis    Plan:   GI bleed: Evaluated by GI. Patient was on Plavix. On hold.  Anemia: Two units of blood given. Monitor. Transfuse as needed.  Hypotension: Improving. Monitor.  AKI: Improving  DVT prophylaxis    Case discussed with: Patient,staff and consultant.    Safety Checklist:     DVT prophylaxis:  CHEST guideline (See page e199S) Mechanical   Foley:  [place] Rn Foley protocol Not present   IVs:  Peripheral IV   PT/OT: Ordered   Daily CBC & or Chem ordered:  SHM/ABIM guidelines (see #5) Yes, due to clinical and lab instability   Reference for approximate charges of common labs: CBC auto diff - $76  BMP - $99  Mg - $79    Lines:     Patient Lines/Drains/Airways Status    Active PICC Line / CVC Line / PIV Line / Drain / Airway / Intraosseous Line / Epidural Line / ART Line / Line / Wound / Pressure Ulcer / NG/OG Tube     Name:   Placement date:   Placement time:   Site:   Days:    Peripheral IV 12/03/17 Right Antecubital  12/03/17    1508    Antecubital    1                 Disposition: (Please see PAF column for Expected D/C Date)   Today's date: 12/04/2017  Admit Date: 12/03/2017  2:04 PM  LOS: 0  Clinical Milestones: Stable Hemoglobin, Push Enteroscopy  Anticipated discharge needs: Home health care      Subjective     CC: GI bleed    Interval History/24 hour events: Patient was admitted with GI bleed    HPI/Subjective: Please see H&P     Review of Systems:     No fever  No sob  No nausea  No headache    Physical Exam:     VITAL SIGNS PHYSICAL EXAM   Temp:   [97.5 F (36.4 C)-98.4 F (36.9 C)] 97.9 F (36.6 C)  Heart Rate:  [49-64] 61  Resp Rate:  [15-18] 18  BP: (84-104)/(37-58) 93/50  Blood Glucose:88    Telemetry: A.fib      Intake/Output Summary (Last 24 hours) at 12/04/17 2027  Last data filed at 12/04/17 1858   Gross per 24 hour   Intake             2010 ml   Output             1175 ml   Net              835 ml    Physical Exam  General: awake, alert X oriented  Cardiovascular: regular rate and rhythm, no murmurs, rubs or gallops  Lungs: clear to auscultation bilaterally, without wheezing, rhonchi, or rales  Abdomen: soft, non-tender, non-distended; no palpable masses,  normoactive bowel sounds  Extremities: no edema  CNS; No focal changes        Meds:  Medications were reviewed:  Current Facility-Administered Medications   Medication Dose Route Frequency   . atorvastatin  80 mg Oral QHS   . pantoprazole  40 mg Intravenous Q12H [facility]       Labs:     Labs (last 72 hours):      Recent Labs  Lab 12/04/17  1733 12/04/17  0405 12/03/17  1506   WBC  --  4.97 5.72   Hgb 7.9* 6.5* 6.2*   Hematocrit 25.9* 21.7* 21.6*   Platelets  --  270 389*            Recent Labs  Lab 12/04/17  0405 12/03/17  1507   Sodium 139 137   Potassium 4.8 4.5   Chloride 113* 108   CO2 17* 19*   BUN 52.0* 61.0*   Creatinine 2.3* 3.1*   Calcium 7.9 8.2   Albumin 2.7* 3.3*   Protein, Total 5.1* 6.2   Bilirubin, Total 0.6 0.1*   Alkaline Phosphatase 158* 194*   ALT 13 13   AST (SGOT) 10 10   Glucose 88 97                   Microbiology, reviewed      Imaging, reviewed        Signed by: Herbert Moors, MD

## 2017-12-04 NOTE — UM Notes (Signed)
UTILIZATION REVIEW CONTACT: Name: Jiles Prows, RN, BSN, MHA, MBA   PRN Clinical Case Manager  - Utilization Review  [facility]  Address:  [address] [place], [place]  [address]  NPI:   [id number]  Tax ID:  [id number]  Phone: [phone]  Fax: [phone]    Please use fax number [phone] to provide authorization for hospital services or to request additional information.        PATIENT NAME: [name],[name]   DOB: [birth year]   PMH:  has a past medical history of Anemia; DOE (dyspnea on exertion); Glaucoma NEC; Hypertensive disorder; Lupus (1990); Skin cancer; Ulcerative colitis, chronic; and Vision abnormalities.  PSH:  has a past surgical history that includes Eye surgery; EGD, COLONOSCOPY (04/03/2012); EGD, COLONOSCOPY (N/A, 02/11/2017); and Cardiac surgery.       ADMISSION REVIEW     12/03/17 1709  Place (admit) on Observation Services (ADULT OBSERVATION ADMIT PANEL) Once    Status:    Question Answer Comment   Admitting Physician Herbert Moors    Diagnosis GI bleed    Estimated Length of Stay < 2 midnights    Tentative Discharge Plan? Home or Self Care    Patient Class Observation            History of present illness: Pt is a 84 y.o. male who arrived to the ER (12/03/2017 at 1404) with C/ODizziness and Hypotension    VS: T97.2, P70, O2 100%, R18, BP 85/48 --> 91/42 --> 102/48, O2 100%    Physical Exam:   Extremities: no clubbing, cyanosis, but mild edema    Abnormal Labs: H/H 6.2/21.6, PLT 389, BUN 61, Cr 3.1, CO2 19, Alb 3.3, Alk Phos 194, TBili 0.1    Diagnostics:  Xr Chest  Ap Portable    Result Date: 12/03/2017   No visible acute cardiopulmonary abnormality. Mild cardiomegaly. Adela Glimpse, MD 12/03/2017 2:35 PM    EKG Results     Procedure Component Value Units Date/Time    ECG 12 lead [[id number]] Collected:  12/03/17 1355    Narrative:       ATRIAL FIBRILLATION WITH A COMPETING JUNCTIONAL PACEMAKER  ABNORMAL ECG  WHEN COMPARED WITH ECG OF 03-Mar-2017 12:34,   ATRIAL FIBRILLATION HAS REPLACED SINUS RHYTHM  T WAVE INVERSION LESS EVIDENT IN LATERAL LEADS     Medications in ED: IVF bolus     GASTROENTEROLOGY  Assessment:  1.GI bleed presenting as dark stools, hemoccult positive in ED, in setting of Plavix and PO iron.  Patient reports intermittently dark stools for the past 6 months.  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 Current bleeding likely from AVMs, exacerbated in setting of Plavix.  2. Acute on chronic, blood loss anemia, likely contributed from above  3. CAD s/p DES on Plavix, last dose 12/03/17  4. A fib    Plan:  1. Follow H&H and continue to monitor for overt GI bleeding  2. Transfuse PRBCs as appropriate  3. Continue Protonix  4. OK for regular diet  5. Push enteroscopy once Plavix has been held for 5 days     MEDICINE PLAN:   Type and screen  Transfuse as needed  GI consult  Protonix  Hydrate  Monitor renal function  Renal consult  Hold Plavix  Rheumatology and Hematology consult   PT and OT  Activity as tolerated  Dispo:   Anticipated medical stability for discharge:Red - not tomorrow - estimated month/date: 12/07/2017   Reason for ongoing hospitalization: GI bleed, symptomatic anemia, Renal failure  Anticipated discharge needs: Home health care    NOTES TO REVIEWER:    This clinical review is based on/compiled from documentation provided by the treatment team within the patient's medical record.

## 2017-12-04 NOTE — Progress Notes (Signed)
Medicine Shift Note    Patient Lines/Drains/Airways Status    Active Lines, Drains and Airways     Name:   Placement date:   Placement time:   Site:   Days:    Peripheral IV 12/03/17 Right Antecubital  12/03/17    1508    Antecubital    1                Shift note    Last BM: 12/02/17    Pending Orders: H& H result pending, Morning labs    Discharge Plan:When  Medically stable    POC: patient    Skin: Intact scattered bruising on (B)UE, varicose vein on (B)LE, blanchable erythema on the gluteal cleft    Tele: yes     Activity: stand by assist     PT/OT: No    Interpreter Needs: No    Shift Note: A*Ox4, follows command,Hypotensive other bradycardic asymptomatic other vs stable.  Lungs are clear upon auscultation. MAE. POC reviewed with pt. Pt is voiding adequate amount urine output and denies any dizziness/ chest pain/ lightheadedness, during ambulation. Denies any pain, NV. Dr. Georga Bora aware of BP. 1 unit of blood transfusion and well tolerated. H & H  Result pending All concerns and questions answered and denies further question. Fall and precaution maintained. Call bell within reach and hourly round. Plan:  Will continue to monitor.

## 2017-12-04 NOTE — Plan of Care (Signed)
Problem: Moderate/High Fall Risk Score >5  Goal: Patient will remain free of falls  Outcome: Progressing   12/03/17 2000   OTHER   High (Greater than 13) HIGH-(VH Only) Yellow slippers;HIGH-Consider use of low bed;HIGH-Initiate use of floor mats as appropriate;HIGH-Apply yellow "Fall Risk" arm band;HIGH-Activate bed/chair exit alarm where available;MOD-Place Fall Risk level on whiteboard in room;MOD-Include family in multidisciplinary POC discussions;MOD-Remain with patient during toileting;MOD-Initiate Yellow "Fall Risk" magnet communication tool;LOW-Fall Interventions Appropriate for Low Fall Risk       Problem: Safety  Goal: Patient will be free from injury during hospitalization  Outcome: Progressing   12/04/17 0051   Goal/Interventions addressed this shift   Patient will be free from injury during hospitalization  Assess patient's risk for falls and implement fall prevention plan of care per policy;Provide and maintain safe environment;Use appropriate transfer methods;Ensure appropriate safety devices are available at the bedside;Include patient/ family/ care giver in decisions related to safety;Hourly rounding;Assess for patients risk for elopement and implement Elopement Risk Plan per policy     Goal: Patient will be free from infection during hospitalization  Outcome: Progressing   12/04/17 0051   Goal/Interventions addressed this shift   Free from Infection during hospitalization Assess and monitor for signs and symptoms of infection;Monitor lab/diagnostic results;Monitor all insertion sites (i.e. indwelling lines, tubes, urinary catheters, and drains);Encourage patient and family to use good hand hygiene technique       Problem: Pain  Goal: Pain at adequate level as identified by patient  Outcome: Completed Date Met: 12/04/17   12/04/17 0051   Goal/Interventions addressed this shift   Pain at adequate level as identified by patient Assess pain on admission, during daily assessment and/or before any "as  needed" intervention(s)       Problem: Discharge Barriers  Goal: Patient will be discharged home or other facility with appropriate resources  Outcome: Progressing   12/04/17 0051   Goal/Interventions addressed this shift   Discharge to home or other facility with appropriate resources Provide appropriate patient education;Provide information on available health resources;Initiate discharge planning       Problem: Psychosocial and Spiritual Needs  Goal: Demonstrates ability to cope with hospitalization/illness  Outcome: Progressing   12/04/17 0051   Goal/Interventions addressed this shift   Demonstrates ability to cope with hospitalizations/illness Encourage verbalization of feelings/concerns/expectations;Provide quiet environment;Assist patient to identify own strengths and abilities;Encourage patient to set small goals for self;Encourage participation in diversional activity;Reinforce positive adaptation of new coping behaviors;Include patient/ patient care companion in decisions;Communicate referral to spiritual care as appropriate       Problem: Compromised Hemodynamic Status  Goal: Vital signs and fluid balance maintained/improved  Outcome: Progressing   12/04/17 0051   Goal/Interventions addressed this shift   Vital signs and fluid balance are maintained/improved Position patient for maximum circulation/cardiac output;Monitor/assess vitals and hemodynamic parameters with position changes;Monitor intake and output. Notify LIP if urine output is less than 30 mL/hour.;Monitor/assess lab values and report abnormal values       Problem: Activity Intolerance /Fatigue  Goal: Fatigue Management  Outcome: Progressing   12/04/17 0051   Goal/Interventions addressed this shift   Fatigue management  Provide assistance with self-care as needed;Encourage relaxation techniques;Energy management;Monitor and assess vital signs and oxygen saturation;Monitor hemoglobin and hematocrit;Increase activities as tolerated/progressive  mobility

## 2017-12-04 NOTE — Plan of Care (Signed)
Problem: Safety  Goal: Patient will be free from injury during hospitalization  Outcome: Progressing   12/04/17 2311   Goal/Interventions addressed this shift   Patient will be free from injury during hospitalization  Assess patient's risk for falls and implement fall prevention plan of care per policy;Provide and maintain safe environment;Use appropriate transfer methods;Ensure appropriate safety devices are available at the bedside;Include patient/ family/ care giver in decisions related to safety;Hourly rounding;Assess for patients risk for elopement and implement Elopement Risk Plan per policy     Goal: Patient will be free from infection during hospitalization  Outcome: Progressing   12/04/17 2311   Goal/Interventions addressed this shift   Free from Infection during hospitalization Assess and monitor for signs and symptoms of infection;Monitor lab/diagnostic results;Monitor all insertion sites (i.e. indwelling lines, tubes, urinary catheters, and drains);Encourage patient and family to use good hand hygiene technique       Problem: Discharge Barriers  Goal: Patient will be discharged home or other facility with appropriate resources  Outcome: Progressing   12/04/17 2311   Goal/Interventions addressed this shift   Discharge to home or other facility with appropriate resources Provide appropriate patient education;Provide information on available health resources;Initiate discharge planning       Problem: Psychosocial and Spiritual Needs  Goal: Demonstrates ability to cope with hospitalization/illness  Outcome: Completed Date Met: 12/04/17   12/04/17 2311   Goal/Interventions addressed this shift   Demonstrates ability to cope with hospitalizations/illness Encourage verbalization of feelings/concerns/expectations;Provide quiet environment;Assist patient to identify own strengths and abilities;Encourage patient to set small goals for self;Encourage participation in diversional activity;Reinforce positive  adaptation of new coping behaviors;Include patient/ patient care companion in decisions;Communicate referral to spiritual care as appropriate       Problem: Activity Intolerance /Fatigue  Goal: Fatigue Management  Outcome: Progressing   12/04/17 2311   Goal/Interventions addressed this shift   Fatigue management  Provide assistance with self-care as needed;Encourage relaxation techniques;Energy management;Monitor and assess vital signs and oxygen saturation;Monitor hemoglobin and hematocrit;Increase activities as tolerated/progressive mobility       Problem: Altered GI Function  Goal: Fluid and electrolyte balance are achieved/maintained  Outcome: Progressing   12/04/17 2311   Goal/Interventions addressed this shift   Fluid and electrolyte balance are achieved/maintained Monitor intake and output every shift;Monitor/assess lab values and report abnormal values;Provide adequate hydration;Assess for confusion/personality changes;Assess and reassess fluid and electrolyte status;Observe for cardiac arrhythmias;Monitor for muscle weakness     Goal: Elimination patterns are normal or improving  Outcome: Progressing   12/04/17 2311   Goal/Interventions addressed this shift   Elimination patterns are normal or improving Report abnormal assessment to physician;Anticipate/assist with toileting needs;Assess for normal bowel sounds;Monitor for abdominal distension;Monitor for abdominal discomfort;Assess for signs and symptoms of bleeding. Report signs of bleeding to physician;Administer treatments as ordered;Assess for flatus;Reinforce education on foods that improve and complicate bowel movements and how activity and medications can affect bowel movements;Administer medications to improve bowel evacuation as prescribed;Encourage /perform oral hygiene as appropriate     Goal: No bleeding  Outcome: Progressing   12/04/17 2311   Goal/Interventions addressed this shift   No bleeding  Monitor and assess vitals and hemodynamic  parameters;Monitor/assess lab values and report abnormal values;Assess for bruising/petechia       Problem: Compromised Hemodynamic Status  Goal: Vital signs and fluid balance maintained/improved  Outcome: Progressing   12/04/17 2311   Goal/Interventions addressed this shift   Vital signs and fluid balance are maintained/improved Position patient for maximum circulation/cardiac  output;Monitor/assess vitals and hemodynamic parameters with position changes;Monitor intake and output. Notify LIP if urine output is less than 30 mL/hour.;Monitor/assess lab values and report abnormal values

## 2017-12-05 LAB — ECG 12-LEAD
Atrial Rate: 62 {beats}/min
Q-T Interval: 438 ms
QRS Duration: 98 ms
QTC Calculation (Bezet): 444 ms
R Axis: -17 degrees
T Axis: 81 degrees
Ventricular Rate: 62 {beats}/min

## 2017-12-05 LAB — COMPREHENSIVE METABOLIC PANEL
ALT: 11 U/L (ref 0–55)
AST (SGOT): 11 U/L (ref 5–34)
Albumin/Globulin Ratio: 1.2 (ref 0.9–2.2)
Albumin: 2.8 g/dL — ABNORMAL LOW (ref 3.5–5.0)
Alkaline Phosphatase: 158 U/L — ABNORMAL HIGH (ref 38–106)
BUN: 40 mg/dL — ABNORMAL HIGH (ref 9.0–28.0)
Bilirubin, Total: 0.6 mg/dL (ref 0.2–1.2)
CO2: 18 mEq/L — ABNORMAL LOW (ref 22–29)
Calcium: 8 mg/dL (ref 7.9–10.2)
Chloride: 112 mEq/L — ABNORMAL HIGH (ref 100–111)
Creatinine: 1.9 mg/dL — ABNORMAL HIGH (ref 0.7–1.3)
Globulin: 2.3 g/dL (ref 2.0–3.6)
Glucose: 86 mg/dL (ref 70–100)
Potassium: 5.3 mEq/L — ABNORMAL HIGH (ref 3.5–5.1)
Protein, Total: 5.1 g/dL — ABNORMAL LOW (ref 6.0–8.3)
Sodium: 138 mEq/L (ref 136–145)

## 2017-12-05 LAB — CBC
Absolute NRBC: 0 10*3/uL (ref 0.00–0.00)
Hematocrit: 25 % — ABNORMAL LOW (ref 37.6–49.6)
Hgb: 7.8 g/dL — ABNORMAL LOW (ref 12.5–17.1)
MCH: 31 pg (ref 25.1–33.5)
MCHC: 31.2 g/dL — ABNORMAL LOW (ref 31.5–35.8)
MCV: 99.2 fL — ABNORMAL HIGH (ref 78.0–96.0)
MPV: 9.8 fL (ref 8.9–12.5)
Nucleated RBC: 0 /100 WBC (ref 0.0–0.0)
Platelets: 274 10*3/uL (ref 142–346)
RBC: 2.52 10*6/uL — ABNORMAL LOW (ref 4.20–5.90)
RDW: 17 % — ABNORMAL HIGH (ref 11–15)
WBC: 6.77 10*3/uL (ref 3.10–9.50)

## 2017-12-05 LAB — POTASSIUM: Potassium: 5.3 mEq/L — ABNORMAL HIGH (ref 3.5–5.1)

## 2017-12-05 LAB — GFR: EGFR: 33.6

## 2017-12-05 LAB — B-TYPE NATRIURETIC PEPTIDE: B-Natriuretic Peptide: 1033 pg/mL — ABNORMAL HIGH (ref 0–100)

## 2017-12-05 MED ORDER — SODIUM CHLORIDE 0.9 % IV SOLN
INTRAVENOUS | Status: DC
Start: 2017-12-05 — End: 2017-12-09

## 2017-12-05 MED ORDER — SODIUM BICARBONATE 650 MG PO TABS
650.00 mg | ORAL_TABLET | Freq: Three times a day (TID) | ORAL | Status: DC
Start: 2017-12-05 — End: 2017-12-09
  Administered 2017-12-05 – 2017-12-09 (×12): 650 mg via ORAL
  Filled 2017-12-05 (×12): qty 1

## 2017-12-05 NOTE — Progress Notes (Addendum)
CASE MANAGEMENT PROGRESS NOTE      Patient: [name], [name]  DOB: 1928-02-21, 83 y.o., Male  MRN: [id number]  CSN: [id number]    Admission Date:  12/03/2017  2:04 PM  Hospital Day     Active Hospital Problem List  Active Hospital Problems    Diagnosis   . GI bleed     Length of stay: Hospital Day      S: " I live alone and daughter check in on me every day."      B:  Pt admitted for passing black stool.      A: Awake and alert patient, who cooperatively provides the information for the IDPA.       R:  Per the patient, he lives alone and his daughter comes to his home each day to check on him. He denies using any DME or having an Proofreader. The family is supportive and he is expected to return home with no needs. Case manager will follow peripherally  to address transitional care parameters.      12/05/17 1616   Patient Type   Within 30 Days of Previous Admission? No   Healthcare Decisions   Interviewed: Patient   English as a second language teacher Information: (patient)   Orientation/Decision Making Abilities of Patient Alert and Oriented x3, able to make decisions   Advance Directive Patient has advance directive, copy in chart;Patient does not have advance directive   Advance Directive not in Chart Copy requested from family/decision maker   Healthcare Agent Appointed No   Healthcare Agent's Name (none)   Healthcare Agent's Phone Number (none)   Additional Emergency Contacts? Kilan [place] [phone])   Prior to admission   Prior level of function Independent with ADLs   Type of Residence Private residence   Home Layout Multi-level;Work area in basement;Performs ADL's on one level;Able to live on main level with bedroom/bathroom   Have running water, electricity, heat, etc? Yes   Living Arrangements Alone   How do you get to your MD appointments? (daughter drives the patient.)   How do you get your groceries? (the daughter drives the patient)   Who fixes your meals? (pt prepares meals )   Who does your laundry? (the  daughter does the laundry)   Who picks up your prescriptions? (the family goe sto [facility] drive through)   Dressing Independent   Grooming Independent   Feeding Independent   Bathing Independent   DME Currently at Home (none)       Consuello Masse, RN, [facility]  Case Manager  [facility]   [address]  [place], [place]  [address]  12/05/2017 4:20 PM

## 2017-12-05 NOTE — Progress Notes (Addendum)
Medicine Shift Note    Patient Lines/Drains/Airways Status    Active Lines, Drains and Airways     Name:   Placement date:   Placement time:   Site:   Days:    Peripheral IV 12/03/17 Right Antecubital  12/03/17    1508    Antecubital    1                Last BM: 12/03/17 (back, tarry stool per pt)    Pending Orders: AM labs. Enteroscopy after 5 days without Plavix per GI.     Discharge Plan: Home    POC: Pt    Skin:  Pale. (B)UE ecchymosis. Varicose veins in (B)LE. Blanchable erythema in (B)elbow and gluteal cleft. Skin protectant applied.  (R)elbow abrasion.     Tele: SB-SR on continuous telemetry    Activity: Pt ambulating with standby or contact guard assistance.    PT/OT: No    Interpreter Needs: No    Shift Note:     Pt a&o X4.  Hypotensive and brady but VS otherwise stable.  BS CTA.  Encouraged turn, cough, and deep breathing.  Last H/H-7.9/25.9.  Tolerating regular diet and whole pills with thin liquids.  Continent of bowel and bladder.  No BM overnight.  On scheduled Protonix IV BID.  Denies dizziness/lightheadedness, SOB, and generalized weakness @this  time.  All questions and concerns addressed.  All high fall risk interventions initiated.  Will continue to monitor pt closely.

## 2017-12-05 NOTE — Plan of Care (Signed)
Problem: Moderate/High Fall Risk Score >5  Goal: Patient will remain free of falls  Outcome: Progressing   12/04/17 2000   OTHER   High (Greater than 13) HIGH-(VH Only) Yellow slippers;HIGH-Consider use of low bed;HIGH-Initiate use of floor mats as appropriate;HIGH-Apply yellow "Fall Risk" arm band;HIGH-Activate bed/chair exit alarm where available;MOD-Place Fall Risk level on whiteboard in room;MOD-Include family in multidisciplinary POC discussions;MOD-Remain with patient during toileting;MOD-Initiate Yellow "Fall Risk" magnet communication tool;LOW-Fall Interventions Appropriate for Low Fall Risk       Problem: Safety  Goal: Patient will be free from injury during hospitalization  Outcome: Progressing   12/05/17 1440   Goal/Interventions addressed this shift   Patient will be free from injury during hospitalization  Assess patient's risk for falls and implement fall prevention plan of care per policy;Provide and maintain safe environment;Use appropriate transfer methods;Ensure appropriate safety devices are available at the bedside;Include patient/ family/ care giver in decisions related to safety;Hourly rounding;Assess for patients risk for elopement and implement Elopement Risk Plan per policy;Provide alternative method of communication if needed (communication boards, writing)     Goal: Patient will be free from infection during hospitalization  Outcome: Progressing   12/05/17 1440   Goal/Interventions addressed this shift   Free from Infection during hospitalization Assess and monitor for signs and symptoms of infection;Monitor lab/diagnostic results;Monitor all insertion sites (i.e. indwelling lines, tubes, urinary catheters, and drains)       Problem: Discharge Barriers  Goal: Patient will be discharged home or other facility with appropriate resources  Outcome: Progressing   12/05/17 1440   Goal/Interventions addressed this shift   Discharge to home or other facility with appropriate resources Provide  appropriate patient education;Provide information on available health resources       Problem: Activity Intolerance /Fatigue  Goal: Fatigue Management  Outcome: Progressing   12/05/17 1440   Goal/Interventions addressed this shift   Fatigue management  Provide assistance with self-care as needed;Energy management;Monitor and assess vital signs and oxygen saturation;Monitor hemoglobin and hematocrit;Consult/collaborate with Physical Therapy, Occupational Therapy and/or Speech Therapy;Increase activities as tolerated/progressive mobility       Problem: Altered GI Function  Goal: Fluid and electrolyte balance are achieved/maintained  Outcome: Progressing   12/05/17 1440   Goal/Interventions addressed this shift   Fluid and electrolyte balance are achieved/maintained Monitor intake and output every shift;Monitor/assess lab values and report abnormal values;Provide adequate hydration;Assess for confusion/personality changes;Assess and reassess fluid and electrolyte status;Observe for seizure activity and initiate seizure precautions if indicated;Monitor for muscle weakness;Observe for cardiac arrhythmias     Goal: Elimination patterns are normal or improving  Outcome: Progressing   12/05/17 1440   Goal/Interventions addressed this shift   Elimination patterns are normal or improving Report abnormal assessment to physician;Anticipate/assist with toileting needs;Assess for normal bowel sounds;Monitor for abdominal distension;Monitor for abdominal discomfort;Assess for signs and symptoms of bleeding. Report signs of bleeding to physician;Administer treatments as ordered;Assess for flatus;Reinforce education on foods that improve and complicate bowel movements and how activity and medications can affect bowel movements;Administer medications to improve bowel evacuation as prescribed     Goal: No bleeding  Outcome: Progressing   12/05/17 1440   Goal/Interventions addressed this shift   No bleeding  Monitor and assess vitals  and hemodynamic parameters;Assess for bruising/petechia;Monitor/assess lab values and report abnormal values       Problem: Compromised Hemodynamic Status  Goal: Vital signs and fluid balance maintained/improved  Outcome: Progressing   12/05/17 1440   Goal/Interventions addressed this shift   Vital  signs and fluid balance are maintained/improved Position patient for maximum circulation/cardiac output;Monitor/assess lab values and report abnormal values;Monitor intake and output. Notify LIP if urine output is less than 30 mL/hour.

## 2017-12-05 NOTE — PT Eval Note (Signed)
[facility]   Physical Therapy Evaluation   Patient: [name]    MRN#: [id number]   Unit: [facility] TOWER 9  Bed: F936/F936.01    Discharge Recommendations:   Discharge Recommendation: Home with supervision   DME Recommendation: Front Wheeled Walker      Assessment:   [name] is a 84 y.o. male admitted 12/03/2017.  Patient in bed upon arrival, amenable to physical therapy evaluation. Daughter present in roomPt requires SBA for all functional mobility and ambulation. [name] presents with minor balance deficits this session and can benefit from utilizing a FWW or possibly SPC. [name] has stairs at home, but requested no stair training this session due to fatigue. [name] scheduled for enteroscopy on Thursday.    [name]'s functional mobility is impacted by:  decreased activity tolerance, gait impairment and impaired balance.  There are a few comorbidities or other factors that affect plan of care and require modification of task including: has stairs to manage and lives alone.  Standardized tests and exams incorporated into evaluation include balance, strength, cognition/orientation and coordination.  [name] demonstrates a stable clinical presentation.   [name] would continue to benefit from [name] to address these deficits and increase functional independence.    Therapy Diagnosis: impaired functional mobility    Rehabilitation Potential: good    Treatment Activities: Evaluation, therapeutic exercise  Educated the patient to role of physical therapy, plan of care, goals of therapy and safety with mobility and ADLs.    Plan:   Treatment/Interventions: Exercise, Gait training, Stair training, Functional transfer training, LE strengthening/ROM, Endurance training, Neuromuscular re-education, Patient/family training, Equipment eval/education, Bed mobility     [name] Frequency: 1-2x/wk   Risks/Benefits/POC Discussed with [name]/Family: With patient/family        Precautions and Contraindications:    Falls   Monitor  vitals    Consult received for [name] for [name] Evaluation and Treatment.  Patient's medical condition is appropriate for Physical therapy intervention at this time.    History of Present Illness:   Per H&P:  [name] is a 84 y.o. male admitted on 12/03/2017 with "C/o dizziness and passing black colored stool. Patient had EGD and Colonoscopy in 2018. Found to have angiectasia. No pain abdomen."      Medical Diagnosis: Anemia, unspecified type [D64.9]  Gastrointestinal hemorrhage, unspecified gastrointestinal hemorrhage type [K92.2]  GI bleed [K92.2]    Past Medical/Surgical History:  Past Medical History:   Diagnosis Date   . Anemia     iron low   . DOE (dyspnea on exertion)     mild   . Glaucoma NEC    . Hypertensive disorder    . Lupus 1990   . Skin cancer     2006 removed mole   . Ulcerative colitis, chronic    . Vision abnormalities        Social History:   Prior Level of Function:  Prior level of function: Independent with all ADLs  Baseline activity level: community distances  DME Currently at Home: None    Home Living Arrangements:   Living Arrangements: patient lives alone  Home Layout: multilevel home  Stairs to Enter: 0 steps to enter; full flight to bed/bath    Subjective: "I don't need an assistive device"   Patient is agreeable to participation in the therapy session. Nursing clears patient for therapy.     Patient goal: return to PLOF    Pain Assessment:  No report of pain  Objective:   Observation of Patient:   Patient with telemetry, peripheral IV in place.    Cognition/Neuro Status:  Behavior: Cooperative, Pleasant  Orientation Level: Person, Place , Time and Situation  Safety Awareness: minimal verbal instruction    Musculoskeletal Examination:  Gross ROM  RUE: within normal limits  LUE: within normal limits  RLE: within normal limits  LLE: within normal limits    Gross Strength  RUE: 4/5  LUE: 4/5  RLE: 4/5  LLE: 4/5    Functional Mobility:  PMP - Progressive Mobility Protocol   PMP  Activity: Step 7 - Walks out of Room  Distance Walked (ft) (Step 6,7): 200 Feet    Bed Mobility:     Supine to Sit: Stand-by assistance  Sit to Supine: Stand-by assistance    Transfers:  Sit to Stand: Stand-by assistance  Stand to Sit: Stand-by assistance    Ambulation:   Distance: 200 Feet  Device: Front wheel walker  Assist Level: Stand-by assistance  Pattern: decreased pace , decreased cadence, forward flexed posture     Balance:  Static sitting: good   Dynamic sitting: good   Static standing: good   Dynamic standing: fair     Sensation:  Right UE: intact   Left UE: intact   Right LE: intact   Left LE: intact      Therex (Supine):  Ankle pumps: 20 reps bilaterally  Glute sets: 10 reps bilaterally  Quad sets: 10 reps bilaterally  Heel slides: 10 reps bilaterally     BP 112/57  In sitting. HR 60      Participation and Activity Tolerance:  Participation Effort: good  Patient Endurance: fair+    Patient left with call bell within reach, all needs met, and all questions answered. RN notified of session outcome and patient response.      SCD's off (as found)   Floor mat in place   Bed alarm on    Goals:   Goals  Goal Formulation: With patient/family  Time for Goal Acheivement: 5 visits  Goals: Select goal  [name] Will Transfer Bed/Chair: with rolling walker, to maximize functional mobility and independence, modified independent  [name] Will Ambulate: > 200 feet, with rolling walker, modified independent  [name] Will Go Up / Down Stairs: 1 flight, modified independent, With rail, to maximize functional mobility and independence  [name] Will Perform Home Exer Program: independent, to maximize functional mobility and independence    Time of treatment:   [name] Received On: 12/05/17  Start Time: 1115  Stop Time: 1155  Time Calculation (min): 40 min    [name], [name], [name]  Pager # [phone]

## 2017-12-05 NOTE — UM Notes (Signed)
Admit to Inpatient (Order [id number])   Admission   Date: 12/05/2017 Department: [facility] 9 Released By: Pollyann Kennedy, RN (auto-released) Authorizing: Herbert Moors, MD   Order Information     Order Date/Time Release Date/Time Start Date/Time End Date/Time   12/05/17 09:36 AM 12/05/17 09:36 AM 12/05/17 09:36 AM 12/05/17 09:36 AM     Patient upgraded from obs to Inpatient December 05, 2017 for meeting > 2 midnights, need for ongoing anemia workup, holding Plavix for 5 days for push enteroscopy.    Admit date: December 03, 2017  Unit: Medicine    84 y.o.malewith a PMH of anemia, prior GI bleeds, AVMs, A fib, CAD with recent DES placement (12/4) on Plavix who presents to the hospital 9/14 with symptomatic anemia.  Hgb found to be 6.2.  It was 9.8 in December 2018.  He states his stools have been intermittently dark over the past 6 months but no hematochezia.     Vitals:  98.2, 56, 16, 85/40, 96 % RA    Labs:  hgb 7.8, hct 25  BUN 40, Cr 1.9, K 5.3, Cl 112, CO2 18, GFR 33.6  AP 158, Albumin 2.8, Total protein 5.1,     Current Facility-Administered Medications   Medication Dose Route Frequency   . atorvastatin  80 mg Oral QHS   . pantoprazole  40 mg Intravenous Q12H SCH   . sodium bicarbonate  650 mg Oral TID     Continuous Infusions:  . sodium chloride 75 mL/hr at 12/05/17 1007     Plan:  GI following - push enteroscopy once plavix held for 5 days  Transfuse pRBCs prn  Monitor H+H  Continue protonix  Monitor hypotension  IVF  Telemetry monitoring    Pollyann Kennedy, RN, BSN   UR Case Manager   [phone]  Case Management Department   [facility]   [address]   [place], [place] [address]  [email or web address]

## 2017-12-05 NOTE — Consults (Signed)
CONSULTATION    Date Time: 12/05/17 10:50 AM  Patient Name: [name],[name]  Requesting Physician: Herbert Moors, MD      Reason for Consultation:   CKD 2    Assessment:   CKD 3  GI bleed  PAF   Hypotension   HLD  HX SLE  Hyperkalemia   AG MA with hypoalbuminemia   Plan:   Sodium  bicarb 650 mg po TID   Continue IVF NS   Will follow / thank you   Case d/w patient and staff care coordinated   History:   [name] is a 84 y.o. male who presents to the hospital on 12/03/2017 with black tarry stools hx CKD 3 we are asked for management   No dysuria . Hematuria , frequency   No abdominal pain     Past Medical History:     Past Medical History:   Diagnosis Date   . Anemia     iron low   . DOE (dyspnea on exertion)     mild   . Glaucoma NEC    . Hypertensive disorder    . Lupus 1990   . Skin cancer     2006 removed mole   . Ulcerative colitis, chronic    . Vision abnormalities        Past Surgical History:     Past Surgical History:   Procedure Laterality Date   . CARDIAC SURGERY      3 stents   . EGD, COLONOSCOPY  04/03/2012    Procedure: EGD, COLONOSCOPY;  Surgeon: Lestine Mount, MD;  Location: [facility];  Service: Gastroenterology;  Laterality: N/A;   . EGD, COLONOSCOPY N/A 02/11/2017    Procedure: EGD, COLONOSCOPY;  Surgeon: Lestine Mount, MD;  Location: [facility];  Service: Gastroenterology;  Laterality: N/A;   . EYE SURGERY      cataract       Family History:   History reviewed. No pertinent family history.    Social History:     Social History     Social History   . Marital status: Married     Spouse name: N/A   . Number of children: N/A   . Years of education: N/A     Social History Main Topics   . Smoking status: Former Smoker     Packs/day: 1.00     Years: 12.00     Types: Cigarettes     Quit date: 12/18/1961   . Smokeless tobacco: Never Used   . Alcohol use 0.0 oz/week      Comment: 0.5oz/d   . Drug use: No   . Sexual activity: Not on file     Other Topics Concern   . Not on file     Social History  Narrative   . No narrative on file       Allergies:   No Known Allergies    Medications:     Current Facility-Administered Medications   Medication Dose Route Frequency   . atorvastatin  80 mg Oral QHS   . pantoprazole  40 mg Intravenous Q12H SCH       Review of Systems:   A comprehensive review of systems was: per HPI     Physical Exam:     Vitals:    12/05/17 0748   BP: 91/48   Pulse: (!) 51   Resp: 15   Temp: 98.1 F (36.7 C)   SpO2: 98%       Intake and  Output Summary (Last 24 hours) at Date Time    Intake/Output Summary (Last 24 hours) at 12/05/17 1050  Last data filed at 12/05/17 0600   Gross per 24 hour   Intake             1010 ml   Output              715 ml   Net              295 ml     General appearance - alert, well appearing, and in no distress  Chest - clear to auscultation, no wheezes, rales or rhonchi, symmetric air entry  Heart - normal rate, regular rhythm, normal S1, S2, no murmurs, rubs, clicks or gallops  Abdomen - soft, nontender, nondistended, no masses or organomegaly  Neurological - alert, oriented, normal speech, no focal findings or movement disorder noted  Musculoskeletal - no joint tenderness, deformity or swelling  Extremities - peripheral pulses normal, no pedal edema, no clubbing or cyanosis      Labs Reviewed:     Results     Procedure Component Value Units Date/Time    Potassium [[id number]]  (Abnormal) Collected:  12/05/17 0917    Specimen:  Blood Updated:  12/05/17 0938     Potassium 5.3 (H) mEq/L     Comprehensive metabolic panel [[id number]]  (Abnormal) Collected:  12/05/17 0414    Specimen:  Blood Updated:  12/05/17 0516     Glucose 86 mg/dL      BUN 78.2 (H) mg/dL      Creatinine 1.9 (H) mg/dL      Sodium 956 mEq/L      Potassium 5.3 (H) mEq/L      Chloride 112 (H) mEq/L      CO2 18 (L) mEq/L      Calcium 8.0 mg/dL      Protein, Total 5.1 (L) g/dL      Albumin 2.8 (L) g/dL      AST (SGOT) 11 U/L      ALT 11 U/L      Alkaline Phosphatase 158 (H) U/L      Bilirubin, Total 0.6  mg/dL      Globulin 2.3 g/dL      Albumin/Globulin Ratio 1.2    GFR [[id number]] Collected:  12/05/17 0414     Updated:  12/05/17 0516     EGFR 33.6    CBC [[id number]]  (Abnormal) Collected:  12/05/17 0414    Specimen:  Blood from Blood Updated:  12/05/17 0449     WBC 6.77 x10 3/uL      Hgb 7.8 (L) g/dL      Hematocrit 41.3 (L) %      Platelets 274 x10 3/uL      RBC 2.52 (L) x10 6/uL      MCV 99.2 (H) fL      MCH 31.0 pg      MCHC 31.2 (L) g/dL      RDW 17 (H) %      MPV 9.8 fL      Nucleated RBC 0.0 /100 WBC      Absolute NRBC 0.00 x10 3/uL     Prepare/Crossmatch Red Blood Cells:  One Unit [[id number]] Collected:  12/03/17 1507     Updated:  12/05/17 0042     RBC Leukoreduced RBC Leukoreduced     BLUNIT [id number]     Status transfused     PRODUCT CODE (NON READABLE) [id number]  Expiration Date [id number]     UTYPE O POS    Hemoglobin and hematocrit, blood [[id number]]  (Abnormal) Collected:  12/04/17 1733    Specimen:  Blood Updated:  12/04/17 1819     Hgb 7.9 (L) g/dL      Hematocrit 95.6 (L) %           Rads:   Radiological Procedure reviewed.     Signed by: Schuyler Amor MD  [phone]

## 2017-12-05 NOTE — Consults (Deleted)
[facility]   Yitzhak Awan M. Lucianne Muss, MD, [facility]  Office: [phone]  Cell phone [phone]        Date Time: 12/05/17 1:33 PM  Patient Name: [name],[name]  Requesting Physician: Herbert Moors, MD       Reason for Consultation:   AF  Assessment:    Acute GI bleed   Severe symptomatic anemia- improving after transfusion   H/o multiple AVMs (stomach, duodenum and cecum)    Acute kidney injury- improving.    Two-vessel obstructive CAD s/p PCI/DES to LM/LCx on 02/22/17. Angina free.   H/o Ventricular tachycardia, non-sustained   Atrial fibrillation, paroxysmal, currently in SR. Patient with high risk of GI bleeding and not a candidate for anticoagulation.    CHF with reduced EF of 36%   Severe pulmonary hypertension with an estimated PASP of 63 mmHg by echo   Dyslipidemia   Plan:    Keep off Aspirin   Keep off Plavix 75   Keep of anticoagulation   Lipitor 80 mg qhs   Coreg 3.125 mg bid    Lisinopril 5 mg bid   BNP   Echo  Recent Cardiac Work Up:    EKG 12/03/17:   Sinus rhythm  Frequent PACs  Echo 02/11/17:   The left ventricle is normal in size.  * Left ventricular systolic function is moderately decreased with an  ejection fraction by Biplane Method of Discs of 36 %.  * Left Ventricular diastolic filling parameters are consistent with Grade II  diastolic dysfunction (pseudonormal pattern).  * Right ventricular size and systolic function are normal.  * Mild bi-atrial enlargement.  * Moderate to severe mitral valve regurgitation.  * There is mild tricuspid regurgitation.  * Moderate pulmonary hypertension with estimated right ventricular systolic  pressure of 63.29 mmHg.  Coronary Angiography 02/18/17:   LM: 100% occluded distally.    LAD: 100% occluded at ostium.    LCx: Non-dominant vessel and 100% occluded at ostium.   RCA: Large dominant vessel giving rise to RPDA and RPLB and feeding the left coronary arteries via collaterals. RCA has mild diffuse disease w/o  focal stenosis. RPLB is a large branching vessel w/o any significant stenosis. RPDA is a medium size vessel and is subtotally occluded in mid segment.   He underwent PCI/DES to LM/LCx on 02/22/17 and was started on ASA 81 and Plavix 75.   History:   [name] is a 84 y.o. male who presents to the hospital on 12/03/2017 with melena. He is suspected of having GI bleeding.     He was hospitalized at [facility] from 02/09/2017- 02/23/17 for GI bleeding, NSTEMI, ventricular tachycardia and atrial fibrillation.     He underwent cardiac cath on 02/18/17 which showed very severe CAD (see above) and he received PCI/DES to LM/LCx on 02/22/17 and was started on ASA 81 and Plavix 75.     He was to undergo viability study to guide future PCI to LAD at a later time.     His echo on 02/11/17 showed an EF of 36%. He was started on CHF therapy including Coreg and lisinopril.     He was seen at our office in January 2019 and was found to have stopped his BB and ACEI. He was advised to go back on them. An echo and a viability study ordered but he didn't get them done.     For his atrial fibrillation which converted to NSR spontaneously, it was decided not  to be started on anticoagulation therapy due to high risk of GI bleeding given multiple AVMs (stomach, duodenum and cecum) noted on endoscopic procedures. He was to be evaluated for a Watchman device at a later time.     He was doing well until this morning when he felt a little short of breath with exertion. Denies any chest pain. He received a call from his doctor's office that his Hb is low and to go to ER. He does not endorse seeing any bleeding but he says he did not look into his stool.   Past Medical History:     Past Medical History:   Diagnosis Date   . Anemia     iron low   . DOE (dyspnea on exertion)     mild   . Glaucoma NEC    . Hypertensive disorder    . Lupus 1990   . Skin cancer     2006 removed mole   . Ulcerative colitis, chronic    . Vision abnormalities         Past Surgical History:     Past Surgical History:   Procedure Laterality Date   . CARDIAC SURGERY      3 stents   . EGD, COLONOSCOPY  04/03/2012    Procedure: EGD, COLONOSCOPY;  Surgeon: Lestine Mount, MD;  Location: [facility];  Service: Gastroenterology;  Laterality: N/A;   . EGD, COLONOSCOPY N/A 02/11/2017    Procedure: EGD, COLONOSCOPY;  Surgeon: Lestine Mount, MD;  Location: [facility];  Service: Gastroenterology;  Laterality: N/A;   . EYE SURGERY      cataract       Family History:   History reviewed. No pertinent family history.    Social History:     Social History     Social History   . Marital status: Married     Spouse name: N/A   . Number of children: N/A   . Years of education: N/A     Social History Main Topics   . Smoking status: Former Smoker     Packs/day: 1.00     Years: 12.00     Types: Cigarettes     Quit date: 12/18/1961   . Smokeless tobacco: Never Used   . Alcohol use 0.0 oz/week      Comment: 0.5oz/d   . Drug use: No   . Sexual activity: Not on file     Other Topics Concern   . Not on file     Social History Narrative   . No narrative on file       Allergies:   No Known Allergies    Medications:     Current Facility-Administered Medications   Medication Dose Route Frequency   . atorvastatin  80 mg Oral QHS   . pantoprazole  40 mg Intravenous Q12H SCH   . sodium bicarbonate  650 mg Oral TID       Review of Systems:   Pertinent items are noted in HPI.    Physical Exam:   BP 100/56   Pulse (!) 55   Temp 97.9 F (36.6 C) (Oral)   Resp 16   Ht 1.651 m (5\' 5" )   Wt 59 kg (130 lb)   SpO2 100%   BMI 21.63 kg/m   General: awake, alert, oriented x 3; no acute distress.   HEENT: no pallor, sclera anicteric oropharynx clear without lesions, mucous membranes moist   Neck: supple, no lymphadenopathy, no thyromegaly, no JVD, no  carotid bruits   Cardiovascular: regular rate and rhythm, no murmurs, rubs or gallops   Lungs: clear to auscultation bilaterally, without wheezing, rhonchi, or  rales   Abdomen: soft, non-tender, non-distended; normoactive bowel sounds  Extremities: no clubbing, cyanosis, or edema   Neuro: cranial nerves grossly intact, no focal findings   Skin:Warm, dry      Intake and Output Summary (Last 24 hours) at Date Time    Intake/Output Summary (Last 24 hours) at 12/05/17 1333  Last data filed at 12/05/17 0600   Gross per 24 hour   Intake             1010 ml   Output              715 ml   Net              295 ml         Labs Reviewed:     Results     Procedure Component Value Units Date/Time    Potassium [[id number]]  (Abnormal) Collected:  12/05/17 0917    Specimen:  Blood Updated:  12/05/17 0938     Potassium 5.3 (H) mEq/L     Comprehensive metabolic panel [[id number]]  (Abnormal) Collected:  12/05/17 0414    Specimen:  Blood Updated:  12/05/17 0516     Glucose 86 mg/dL      BUN 78.2 (H) mg/dL      Creatinine 1.9 (H) mg/dL      Sodium 956 mEq/L      Potassium 5.3 (H) mEq/L      Chloride 112 (H) mEq/L      CO2 18 (L) mEq/L      Calcium 8.0 mg/dL      Protein, Total 5.1 (L) g/dL      Albumin 2.8 (L) g/dL      AST (SGOT) 11 U/L      ALT 11 U/L      Alkaline Phosphatase 158 (H) U/L      Bilirubin, Total 0.6 mg/dL      Globulin 2.3 g/dL      Albumin/Globulin Ratio 1.2    GFR [[id number]] Collected:  12/05/17 0414     Updated:  12/05/17 0516     EGFR 33.6    CBC [[id number]]  (Abnormal) Collected:  12/05/17 0414    Specimen:  Blood from Blood Updated:  12/05/17 0449     WBC 6.77 x10 3/uL      Hgb 7.8 (L) g/dL      Hematocrit 41.3 (L) %      Platelets 274 x10 3/uL      RBC 2.52 (L) x10 6/uL      MCV 99.2 (H) fL      MCH 31.0 pg      MCHC 31.2 (L) g/dL      RDW 17 (H) %      MPV 9.8 fL      Nucleated RBC 0.0 /100 WBC      Absolute NRBC 0.00 x10 3/uL     Prepare/Crossmatch Red Blood Cells:  One Unit [[id number]] Collected:  12/03/17 1507     Updated:  12/05/17 0042     RBC Leukoreduced RBC Leukoreduced     BLUNIT [id number]     Status transfused     PRODUCT CODE (NON READABLE) [id number]      Expiration Date [id number]     UTYPE O POS    Hemoglobin and hematocrit, blood [[id number]]  (Abnormal) Collected:  12/04/17  1733    Specimen:  Blood Updated:  12/04/17 1819     Hgb 7.9 (L) g/dL      Hematocrit 16.1 (L) %               Signed by: Valerie Roys, MD, [facility]

## 2017-12-05 NOTE — Progress Notes (Signed)
MEDICINE PROGRESS NOTE    Date Time: 12/05/17 10:37 PM  Patient Name: [name],[name]  Attending Physician: Herbert Moors, MD    Assessment:   GI bleed, possible upper GI. H/o angiectasia stomach, duodenum and colon  Dizziness  Paroxysmal A.fib, not on AC. GI bleed  H/o angiectasis  Hypotension  H/o Hypertension  Anemia, acute on chronic, blood loss  Macrocytosis  Hyperlipidemia  H/o SLE  Dizziness  Renal failure, acute on chronic, GI bleed  Metabolic acidosis  Hyperkalemia, Kayexalate.    Plan:   GI bleed: Evaluated by GI. Patient was on Plavix. On hold. Possible endoscopy on Wednesday.  Anemia: Two units of blood given. Monitor. Transfuse as needed.  Hypotension: Improving. Monitor.  AKI: Improving  DVT prophylaxis    Case discussed with: Patient,staff and consultant.    Safety Checklist:     DVT prophylaxis:  CHEST guideline (See page [id number]) Mechanical   Foley:  [place] Rn Foley protocol Not present   IVs:  Peripheral IV   PT/OT: Ordered   Daily CBC & or Chem ordered:  SHM/ABIM guidelines (see #5) Yes, due to clinical and lab instability   Reference for approximate charges of common labs: CBC auto diff - $76  BMP - $99  Mg - $79    Lines:     Patient Lines/Drains/Airways Status    Active PICC Line / CVC Line / PIV Line / Drain / Airway / Intraosseous Line / Epidural Line / ART Line / Line / Wound / Pressure Ulcer / NG/OG Tube     Name:   Placement date:   Placement time:   Site:   Days:    Peripheral IV 12/03/17 Right Antecubital  12/03/17    1508    Antecubital    1                 Disposition: (Please see PAF column for Expected D/C Date)   Today's date: 12/05/2017  Admit Date: 12/03/2017  2:04 PM  LOS: 0  Clinical Milestones: Stable Hemoglobin, Push Enteroscopy  Anticipated discharge needs: Home health care      Subjective     CC: No sob    Interval History/24 hour events: Patient tolerates oral feeding.    HPI/Subjective: Please see H&P     Review of Systems:     No cough  No dysuria  No chest pain  No  rash    Physical Exam:     VITAL SIGNS PHYSICAL EXAM   Temp:  [97.7 F (36.5 C)-98.2 F (36.8 C)] 98.1 F (36.7 C)  Heart Rate:  [51-63] 62  Resp Rate:  [15-18] 18  BP: (74-120)/(37-66) 110/51  Blood Glucose:86    Telemetry: A.fib      Intake/Output Summary (Last 24 hours) at 12/05/17 2237  Last data filed at 12/05/17 2144   Gross per 24 hour   Intake              150 ml   Output              815 ml   Net             -665 ml    Physical Exam  General: awake, alert X oriented  Cardiovascular: regular rate and rhythm, no murmurs, rubs or gallops  Lungs: clear to auscultation bilaterally, without wheezing, rhonchi, or rales  Abdomen: soft, non-tender, non-distended; no palpable masses,  normoactive bowel sounds  Extremities: no edema  CNS; No focal changes  Meds:     Medications were reviewed:  Current Facility-Administered Medications   Medication Dose Route Frequency   . atorvastatin  80 mg Oral QHS   . pantoprazole  40 mg Intravenous Q12H SCH   . sodium bicarbonate  650 mg Oral TID       Labs:     Labs (last 72 hours):      Recent Labs  Lab 12/05/17  0414 12/04/17  1733 12/04/17  0405   WBC 6.77  --  4.97   Hgb 7.8* 7.9* 6.5*   Hematocrit 25.0* 25.9* 21.7*   Platelets 274  --  270            Recent Labs  Lab 12/05/17  0917 12/05/17  0414 12/04/17  0405   Sodium  --  138 139   Potassium 5.3* 5.3* 4.8   Chloride  --  112* 113*   CO2  --  18* 17*   BUN  --  40.0* 52.0*   Creatinine  --  1.9* 2.3*   Calcium  --  8.0 7.9   Albumin  --  2.8* 2.7*   Protein, Total  --  5.1* 5.1*   Bilirubin, Total  --  0.6 0.6   Alkaline Phosphatase  --  158* 158*   ALT  --  11 13   AST (SGOT)  --  11 10   Glucose  --  86 88                   Microbiology, reviewed      Imaging, reviewed        Signed by: Herbert Moors, MD

## 2017-12-05 NOTE — Progress Notes (Signed)
Medicine Shift Note    Patient Lines/Drains/Airways Status    Active Lines, Drains and Airways     Name:   Placement date:   Placement time:   Site:   Days:    Peripheral IV 12/03/17 Right Antecubital  12/03/17    1508    Antecubital    2                Last BM: 12/05/17, (black and tarry stool)    Pending Orders: Enteroscopy after 5 days without plavix, this is 3rd day without plavix.    Discharge Plan: Home when stable    POC: Patient    Skin: Varicose veins (B)LE, (R) elbow abrasion    Tele: Sinus brady/Sinus rhythm on continuous tele.    Activity: Ambulates with 1 person assist    PT/OT: 12/05/17    Interpreter Needs: No    Shift Note: A/Ox4. Patient is bradycardic. Potassium level at 5.3. Physical Therapy walked around unit with pt. And suggested that pt. Use walker. Denies SOB, dizziness or weakness. Medium black tarry stool. High fall risk still in place. Safety protocols in place.

## 2017-12-06 LAB — CBC
Absolute NRBC: 0 10*3/uL (ref 0.00–0.00)
Hematocrit: 24.6 % — ABNORMAL LOW (ref 37.6–49.6)
Hgb: 7.5 g/dL — ABNORMAL LOW (ref 12.5–17.1)
MCH: 30.9 pg (ref 25.1–33.5)
MCHC: 30.5 g/dL — ABNORMAL LOW (ref 31.5–35.8)
MCV: 101.2 fL — ABNORMAL HIGH (ref 78.0–96.0)
MPV: 9.6 fL (ref 8.9–12.5)
Nucleated RBC: 0 /100 WBC (ref 0.0–0.0)
Platelets: 265 10*3/uL (ref 142–346)
RBC: 2.43 10*6/uL — ABNORMAL LOW (ref 4.20–5.90)
RDW: 17 % — ABNORMAL HIGH (ref 11–15)
WBC: 5.67 10*3/uL (ref 3.10–9.50)

## 2017-12-06 LAB — COMPREHENSIVE METABOLIC PANEL
ALT: 13 U/L (ref 0–55)
AST (SGOT): 14 U/L (ref 5–34)
Albumin/Globulin Ratio: 1 (ref 0.9–2.2)
Albumin: 2.4 g/dL — ABNORMAL LOW (ref 3.5–5.0)
Alkaline Phosphatase: 168 U/L — ABNORMAL HIGH (ref 38–106)
BUN: 34 mg/dL — ABNORMAL HIGH (ref 9.0–28.0)
Bilirubin, Total: 0.2 mg/dL (ref 0.2–1.2)
CO2: 18 mEq/L — ABNORMAL LOW (ref 22–29)
Calcium: 7.6 mg/dL — ABNORMAL LOW (ref 7.9–10.2)
Chloride: 115 mEq/L — ABNORMAL HIGH (ref 100–111)
Creatinine: 1.6 mg/dL — ABNORMAL HIGH (ref 0.7–1.3)
Globulin: 2.3 g/dL (ref 2.0–3.6)
Glucose: 102 mg/dL — ABNORMAL HIGH (ref 70–100)
Potassium: 4.9 mEq/L (ref 3.5–5.1)
Protein, Total: 4.7 g/dL — ABNORMAL LOW (ref 6.0–8.3)
Sodium: 139 mEq/L (ref 136–145)

## 2017-12-06 LAB — GFR: EGFR: 40.9

## 2017-12-06 NOTE — Plan of Care (Signed)
Problem: Safety  Goal: Patient will be free from injury during hospitalization  Outcome: Progressing   12/06/17 3664   Goal/Interventions addressed this shift   Patient will be free from injury during hospitalization  Assess patient's risk for falls and implement fall prevention plan of care per policy;Provide and maintain safe environment;Ensure appropriate safety devices are available at the bedside;Include patient/ family/ care giver in decisions related to safety;Hourly rounding;Use appropriate transfer methods     Goal: Patient will be free from infection during hospitalization  Outcome: Progressing   12/06/17 4034   Goal/Interventions addressed this shift   Free from Infection during hospitalization Assess and monitor for signs and symptoms of infection;Monitor lab/diagnostic results       Problem: Discharge Barriers  Goal: Patient will be discharged home or other facility with appropriate resources  Outcome: Progressing   12/06/17 0623   Goal/Interventions addressed this shift   Discharge to home or other facility with appropriate resources Provide appropriate patient education;Provide information on available health resources;Initiate discharge planning       Problem: Activity Intolerance /Fatigue  Goal: Fatigue Management  Outcome: Progressing   12/06/17 7425   Goal/Interventions addressed this shift   Fatigue management  Provide assistance with self-care as needed;Encourage relaxation techniques;Energy management;Monitor and assess vital signs and oxygen saturation;Monitor hemoglobin and hematocrit;Consult/collaborate with Physical Therapy, Occupational Therapy and/or Speech Therapy;Increase activities as tolerated/progressive mobility       Problem: Altered GI Function  Goal: Fluid and electrolyte balance are achieved/maintained  Outcome: Progressing   12/06/17 9563   Goal/Interventions addressed this shift   Fluid and electrolyte balance are achieved/maintained Monitor intake and output every  shift;Monitor/assess lab values and report abnormal values;Provide adequate hydration;Assess for confusion/personality changes;Assess and reassess fluid and electrolyte status;Monitor for muscle weakness     Goal: Elimination patterns are normal or improving  Outcome: Progressing   12/06/17 1520   Goal/Interventions addressed this shift   Elimination patterns are normal or improving Report abnormal assessment to physician;Monitor for abdominal distension;Monitor for abdominal discomfort;Anticipate/assist with toileting needs;Assess for normal bowel sounds;Reinforce education on foods that improve and complicate bowel movements and how activity and medications can affect bowel movements;Encourage /perform oral hygiene as appropriate;Assess for flatus;Assess for signs and symptoms of bleeding. Report signs of bleeding to physician;Administer treatments as ordered     Goal: No bleeding  Outcome: Progressing   12/06/17 8756   Goal/Interventions addressed this shift   No bleeding  Monitor and assess vitals and hemodynamic parameters;Monitor/assess lab values and report abnormal values;Assess for bruising/petechia       Problem: Compromised Hemodynamic Status  Goal: Vital signs and fluid balance maintained/improved  Outcome: Progressing   12/05/17 1440   Goal/Interventions addressed this shift   Vital signs and fluid balance are maintained/improved Position patient for maximum circulation/cardiac output;Monitor/assess lab values and report abnormal values;Monitor intake and output. Notify LIP if urine output is less than 30 mL/hour.

## 2017-12-06 NOTE — Progress Notes (Signed)
PROGRESS NOTE    Date Time: 12/06/17 9:43 AM  Patient Name: Ahlquist,Othniel E  Requesting Physician: Herbert Moors, MD        Assessment:   CKD 3  GI bleed  PAF   Hypotension   HLD  HX SLE  Hyperkalemia   AG MA with hypoalbuminemia   Plan:   Serum creatinine at baseline 1.6  Continue sodium bicarb   UOP 775 cc  Case d/w patient and staff care coordinated   History:   HERBIE LEHRMANN is a 84 y.o. male who presents to the hospital on 12/03/2017 with black tarry stools hx CKD 3 we are asked for management   No dysuria . Hematuria , frequency   No abdominal pain     Past Medical History:     Past Medical History:   Diagnosis Date   . Anemia     iron low   . DOE (dyspnea on exertion)     mild   . Glaucoma NEC    . Hypertensive disorder    . Lupus 1990   . Skin cancer     2006 removed mole   . Ulcerative colitis, chronic    . Vision abnormalities        Past Surgical History:     Past Surgical History:   Procedure Laterality Date   . CARDIAC SURGERY      3 stents   . EGD, COLONOSCOPY  04/03/2012    Procedure: EGD, COLONOSCOPY;  Surgeon: Lestine Mount, MD;  Location: [id number];  Service: Gastroenterology;  Laterality: N/A;   . EGD, COLONOSCOPY N/A 02/11/2017    Procedure: EGD, COLONOSCOPY;  Surgeon: Lestine Mount, MD;  Location: [id number];  Service: Gastroenterology;  Laterality: N/A;   . EYE SURGERY      cataract       Family History:   History reviewed. No pertinent family history.    Social History:     Social History     Social History   . Marital status: Married     Spouse name: N/A   . Number of children: N/A   . Years of education: N/A     Social History Main Topics   . Smoking status: Former Smoker     Packs/day: 1.00     Years: 12.00     Types: Cigarettes     Quit date: 12/18/1961   . Smokeless tobacco: Never Used   . Alcohol use 0.0 oz/week      Comment: 0.5oz/d   . Drug use: No   . Sexual activity: Not on file     Other Topics Concern   . Not on file     Social History Narrative   . No narrative on file        Allergies:   No Known Allergies    Medications:     Current Facility-Administered Medications   Medication Dose Route Frequency   . atorvastatin  80 mg Oral QHS   . pantoprazole  40 mg Intravenous Q12H SCH   . sodium bicarbonate  650 mg Oral TID       Review of Systems:   A comprehensive review of systems was: per HPI     Physical Exam:     Vitals:    12/06/17 0828   BP: 110/54   Pulse: (!) 53   Resp: 19   Temp: 98.5 F (36.9 C)   SpO2: 99%       Intake and Output Summary (Last  24 hours) at Date Time    Intake/Output Summary (Last 24 hours) at 12/06/17 0943  Last data filed at 12/06/17 0600   Gross per 24 hour   Intake                0 ml   Output              775 ml   Net             -775 ml     General appearance - alert, well appearing, and in no distress  Chest - clear to auscultation, no wheezes, rales or rhonchi, symmetric air entry  Heart - normal rate, regular rhythm, normal S1, S2, no murmurs, rubs, clicks or gallops  Abdomen - soft, nontender, nondistended, no masses or organomegaly  Neurological - alert, oriented, normal speech, no focal findings or movement disorder noted  Musculoskeletal - no joint tenderness, deformity or swelling  Extremities - peripheral pulses normal, no pedal edema, no clubbing or cyanosis      Labs Reviewed:     Results     Procedure Component Value Units Date/Time    Comprehensive metabolic panel [[id number]]  (Abnormal) Collected:  12/06/17 0339    Specimen:  Blood Updated:  12/06/17 0434     Glucose 102 (H) mg/dL      BUN 10.2 (H) mg/dL      Creatinine 1.6 (H) mg/dL      Sodium 725 mEq/L      Potassium 4.9 mEq/L      Chloride 115 (H) mEq/L      CO2 18 (L) mEq/L      Calcium 7.6 (L) mg/dL      Protein, Total 4.7 (L) g/dL      Albumin 2.4 (L) g/dL      AST (SGOT) 14 U/L      ALT 13 U/L      Alkaline Phosphatase 168 (H) U/L      Bilirubin, Total 0.2 mg/dL      Globulin 2.3 g/dL      Albumin/Globulin Ratio 1.0    GFR [[id number]] Collected:  12/06/17 0339     Updated:   12/06/17 0434     EGFR 40.9    CBC [[id number]]  (Abnormal) Collected:  12/06/17 0339    Specimen:  Blood from Blood Updated:  12/06/17 0415     WBC 5.67 x10 3/uL      RBC 2.43 (L) x10 6/uL      Hgb 7.5 (L) g/dL      Hematocrit 56.4 (L) %      MCV 101.2 (H) fL      MCH 30.9 pg      MCHC 30.5 (L) g/dL      RDW 17 (H) %      Platelets 265 x10 3/uL      MPV 9.6 fL      Nucleated RBC 0.0 /100 WBC      Absolute NRBC 0.00 x10 3/uL     B-type Natriuretic Peptide [[id number]]  (Abnormal) Collected:  12/05/17 1916    Specimen:  Blood Updated:  12/05/17 2000     B-Natriuretic Peptide 1,033 (H) pg/mL           Rads:   Radiological Procedure reviewed.     Signed by: Schuyler Amor MD  [id number]

## 2017-12-06 NOTE — Plan of Care (Signed)
Problem: Moderate/High Fall Risk Score >5  Goal: Patient will remain free of falls  Outcome: Progressing   12/05/17 0900   OTHER   High (Greater than 13) HIGH-Consider use of low bed;HIGH-Initiate use of floor mats as appropriate;HIGH-Apply yellow "Fall Risk" arm band;HIGH-Activate bed/chair exit alarm where available       Problem: Safety  Goal: Patient will be free from injury during hospitalization  Outcome: Progressing   12/06/17 0981   Goal/Interventions addressed this shift   Patient will be free from injury during hospitalization  Assess patient's risk for falls and implement fall prevention plan of care per policy;Provide and maintain safe environment;Ensure appropriate safety devices are available at the bedside;Include patient/ family/ care giver in decisions related to safety;Hourly rounding;Use appropriate transfer methods     Goal: Patient will be free from infection during hospitalization  Outcome: Progressing   12/06/17 1914   Goal/Interventions addressed this shift   Free from Infection during hospitalization Assess and monitor for signs and symptoms of infection;Monitor lab/diagnostic results       Problem: Discharge Barriers  Goal: Patient will be discharged home or other facility with appropriate resources  Outcome: Progressing   12/06/17 0623   Goal/Interventions addressed this shift   Discharge to home or other facility with appropriate resources Provide appropriate patient education;Provide information on available health resources;Initiate discharge planning       Problem: Activity Intolerance /Fatigue  Goal: Fatigue Management  Outcome: Progressing   12/06/17 7829   Goal/Interventions addressed this shift   Fatigue management  Provide assistance with self-care as needed;Encourage relaxation techniques;Energy management;Monitor and assess vital signs and oxygen saturation;Monitor hemoglobin and hematocrit;Consult/collaborate with Physical Therapy, Occupational Therapy and/or Speech  Therapy;Increase activities as tolerated/progressive mobility       Problem: Altered GI Function  Goal: Fluid and electrolyte balance are achieved/maintained  Outcome: Progressing   12/06/17 5621   Goal/Interventions addressed this shift   Fluid and electrolyte balance are achieved/maintained Monitor intake and output every shift;Monitor/assess lab values and report abnormal values;Provide adequate hydration;Assess for confusion/personality changes;Assess and reassess fluid and electrolyte status;Monitor for muscle weakness     Goal: Elimination patterns are normal or improving  Outcome: Progressing   12/06/17 3086   Goal/Interventions addressed this shift   Elimination patterns are normal or improving Report abnormal assessment to physician;Anticipate/assist with toileting needs;Assess for normal bowel sounds;Monitor for abdominal distension;Monitor for abdominal discomfort;Assess for signs and symptoms of bleeding. Report signs of bleeding to physician;Administer treatments as ordered     Goal: No bleeding  Outcome: Progressing   12/06/17 5784   Goal/Interventions addressed this shift   No bleeding  Monitor and assess vitals and hemodynamic parameters;Monitor/assess lab values and report abnormal values;Assess for bruising/petechia       Problem: Compromised Hemodynamic Status  Goal: Vital signs and fluid balance maintained/improved  Outcome: Progressing   12/05/17 1440   Goal/Interventions addressed this shift   Vital signs and fluid balance are maintained/improved Position patient for maximum circulation/cardiac output;Monitor/assess lab values and report abnormal values;Monitor intake and output. Notify LIP if urine output is less than 30 mL/hour.

## 2017-12-06 NOTE — Progress Notes (Signed)
Medicine Shift Note    Patient Lines/Drains/Airways Status    Active Lines, Drains and Airways     Name:   Placement date:   Placement time:   Site:   Days:    Peripheral IV 12/03/17 Right Antecubital  12/03/17    1508    Antecubital    3                Shift note    Last BM: 12/02/17    Pending Orders: Clear liquid diet from 0500 tomorrow and morning labs    Discharge Plan:When  Medically stable    POC: patient    Skin: Intact scattered bruising on (B)UE, varicose vein on (B)LE, blanchable erythema on the gluteal cleft    Tele: yes     Activity: stand by assist     PT/OT: No    Interpreter Needs: No    Shift Note: A*Ox4, follows command, bradycardic at times but asymptomatic other vs stable.  Lungs are clear upon auscultation. MAE. POC reviewed with pt. Pt is voiding adequate amount urine output and denies any dizziness/ chest pain/ lightheadedness, during ambulation. Denies any pain, NV.  All concerns and questions answered and denies further question. Fall and precaution maintained. Call bell within reach and hourly round. Plan:  Will continue to monitor.

## 2017-12-06 NOTE — Plan of Care (Signed)
Problem: Safety  Goal: Patient will be free from injury during hospitalization  Outcome: Progressing   12/06/17 0623   Goal/Interventions addressed this shift   Patient will be free from injury during hospitalization  Assess patient's risk for falls and implement fall prevention plan of care per policy;Provide and maintain safe environment;Ensure appropriate safety devices are available at the bedside;Include patient/ family/ care giver in decisions related to safety;Hourly rounding;Use appropriate transfer methods     Goal: Patient will be free from infection during hospitalization  Outcome: Progressing   12/06/17 0623   Goal/Interventions addressed this shift   Free from Infection during hospitalization Assess and monitor for signs and symptoms of infection;Monitor lab/diagnostic results       Problem: Discharge Barriers  Goal: Patient will be discharged home or other facility with appropriate resources  Outcome: Progressing   12/06/17 0623   Goal/Interventions addressed this shift   Discharge to home or other facility with appropriate resources Provide appropriate patient education;Provide information on available health resources;Initiate discharge planning       Problem: Activity Intolerance /Fatigue  Goal: Fatigue Management  Outcome: Progressing   12/06/17 0623   Goal/Interventions addressed this shift   Fatigue management  Provide assistance with self-care as needed;Encourage relaxation techniques;Energy management;Monitor and assess vital signs and oxygen saturation;Monitor hemoglobin and hematocrit;Consult/collaborate with Physical Therapy, Occupational Therapy and/or Speech Therapy;Increase activities as tolerated/progressive mobility       Problem: Altered GI Function  Goal: Fluid and electrolyte balance are achieved/maintained  Outcome: Progressing   12/06/17 0623   Goal/Interventions addressed this shift   Fluid and electrolyte balance are achieved/maintained Monitor intake and output every  shift;Monitor/assess lab values and report abnormal values;Provide adequate hydration;Assess for confusion/personality changes;Assess and reassess fluid and electrolyte status;Monitor for muscle weakness     Goal: Elimination patterns are normal or improving  Outcome: Progressing   12/06/17 1520   Goal/Interventions addressed this shift   Elimination patterns are normal or improving Report abnormal assessment to physician;Monitor for abdominal distension;Monitor for abdominal discomfort;Anticipate/assist with toileting needs;Assess for normal bowel sounds;Reinforce education on foods that improve and complicate bowel movements and how activity and medications can affect bowel movements;Encourage /perform oral hygiene as appropriate;Assess for flatus;Assess for signs and symptoms of bleeding. Report signs of bleeding to physician;Administer treatments as ordered     Goal: No bleeding  Outcome: Progressing   12/06/17 0623   Goal/Interventions addressed this shift   No bleeding  Monitor and assess vitals and hemodynamic parameters;Monitor/assess lab values and report abnormal values;Assess for bruising/petechia       Problem: Compromised Hemodynamic Status  Goal: Vital signs and fluid balance maintained/improved  Outcome: Progressing   12/05/17 1440   Goal/Interventions addressed this shift   Vital signs and fluid balance are maintained/improved Position patient for maximum circulation/cardiac output;Monitor/assess lab values and report abnormal values;Monitor intake and output. Notify LIP if urine output is less than 30 mL/hour.

## 2017-12-06 NOTE — OT Progress Note (Signed)
Occupational Therapy Note    [facility]   Occupational Therapy Cancellation Note      Patient:  [name] MRN#:  [id number]  Unit:  [facility] TOWER 9 Room/Bed:  F936/F936.01    12/06/2017  Time: 9:40am      Patient not seen for occupational therapy secondary to pt refusing. Pt reports, "I do not need any occupational or physical therapy. I've taken care of myself for 89 years"  OT spent time explaining purpose of PT/OT in hospital setting. Pt continues to refuse. Will discontinue orders at this time. Please re-order when patient agreeable to participating in therapy. RN notified.    Nigel Berthold, Meta Pager [phone]

## 2017-12-06 NOTE — Progress Notes (Signed)
Medicine Shift Note    Patient Lines/Drains/Airways Status    Active Lines, Drains and Airways     Name:   Placement date:   Placement time:   Site:   Days:    Peripheral IV 12/03/17 Right Antecubital  12/03/17    1508    Antecubital    2                Last BM: 9/16    Pending Orders: Endoscopy wed, after plavix wash    Discharge Plan: Home when cleared    POC: Patient    Skin: R elbow abrasion    Tele: Yes    Activity: ambulate with 1 person assist    PT/OT: 9/16    Interpreter Needs: No    Shift Note: AOx4, VSS. Potassium back within normal limits this AM. No complaint of pain or discomfort, able to sleep most of shift. High fall precautions, call bell within reach.

## 2017-12-06 NOTE — Progress Notes (Signed)
MEDICINE PROGRESS NOTE    Date Time: 12/06/17 6:57 PM  Patient Name: [name],[name]  Attending Physician: Herbert Moors, MD    Assessment:   GI bleed, possible upper GI. H/o angiectasia stomach, duodenum and colon. Awaitng for PUSH enteroscopy.  Dizziness  Paroxysmal A.fib, not on AC. GI bleed  H/o angiectasis  Hypotension  H/o Hypertension  Anemia, acute on chronic, blood loss  Macrocytosis  Hyperlipidemia  H/o SLE  Dizziness  Renal failure, acute on chronic, GI bleed  Metabolic acidosis  Hyperkalemia, Kayexalate.    Plan:   GI bleed: Evaluated by GI. Patient was on Plavix. On hold. Possible endoscopy on Thursday.  Anemia: Two units of blood given. Monitor. Transfuse as needed.  Hypotension: Improving. Monitor.  AKI: Improving  DVT prophylaxis    Case discussed with: Patient,staff and consultant.    Safety Checklist:     DVT prophylaxis:  CHEST guideline (See page e199S) Mechanical   Foley:  [place] Rn Foley protocol Not present   IVs:  Peripheral IV   PT/OT: Ordered   Daily CBC & or Chem ordered:  SHM/ABIM guidelines (see #5) Yes, due to clinical and lab instability   Reference for approximate charges of common labs: CBC auto diff - $76  BMP - $99  Mg - $79    Lines:     Patient Lines/Drains/Airways Status    Active PICC Line / CVC Line / PIV Line / Drain / Airway / Intraosseous Line / Epidural Line / ART Line / Line / Wound / Pressure Ulcer / NG/OG Tube     Name:   Placement date:   Placement time:   Site:   Days:    Peripheral IV 12/03/17 Right Antecubital  12/03/17    1508    Antecubital    1                 Disposition: (Please see PAF column for Expected D/C Date)   Today's date: 12/06/2017  Admit Date: 12/03/2017  2:04 PM  LOS: 1  Clinical Milestones: Stable Hemoglobin, Push Enteroscopy  Anticipated discharge needs: Home health care      Subjective     CC: No fever    Interval History/24 hour events: Patient remains stable.    HPI/Subjective: Please see H&P     Review of Systems:     No distress  No  headache  No abdominal pain  No overt bleeding    Physical Exam:     VITAL SIGNS PHYSICAL EXAM   Temp:  [96.6 F (35.9 C)-98.5 F (36.9 C)] 98.1 F (36.7 C)  Heart Rate:  [53-61] 60  Resp Rate:  [17-20] 17  BP: (95-111)/(47-57) 111/57  Blood Glucose:102    Telemetry: A.fib      Intake/Output Summary (Last 24 hours) at 12/06/17 1857  Last data filed at 12/06/17 0600   Gross per 24 hour   Intake                0 ml   Output              550 ml   Net             -550 ml    Physical Exam  General: awake, alert X oriented  Cardiovascular: regular rate and rhythm, no murmurs, rubs or gallops  Lungs: clear to auscultation bilaterally, without wheezing, rhonchi, or rales  Abdomen: soft, non-tender, non-distended; no palpable masses,  normoactive bowel sounds  Extremities: no edema  CNS; No focal changes        Meds:     Medications were reviewed:  Current Facility-Administered Medications   Medication Dose Route Frequency   . atorvastatin  80 mg Oral QHS   . pantoprazole  40 mg Intravenous Q12H SCH   . sodium bicarbonate  650 mg Oral TID       Labs:     Labs (last 72 hours):      Recent Labs  Lab 12/06/17  0339 12/05/17  0414   WBC 5.67 6.77   Hgb 7.5* 7.8*   Hematocrit 24.6* 25.0*   Platelets 265 274            Recent Labs  Lab 12/06/17  0339 12/05/17  0917 12/05/17  0414   Sodium 139  --  138   Potassium 4.9 5.3* 5.3*   Chloride 115*  --  112*   CO2 18*  --  18*   BUN 34.0*  --  40.0*   Creatinine 1.6*  --  1.9*   Calcium 7.6*  --  8.0   Albumin 2.4*  --  2.8*   Protein, Total 4.7*  --  5.1*   Bilirubin, Total 0.2  --  0.6   Alkaline Phosphatase 168*  --  158*   ALT 13  --  11   AST (SGOT) 14  --  11   Glucose 102*  --  86                   Microbiology, reviewed      Imaging, reviewed        Signed by: Herbert Moors, MD

## 2017-12-07 LAB — COMPREHENSIVE METABOLIC PANEL
ALT: 13 U/L (ref 0–55)
AST (SGOT): 16 U/L (ref 5–34)
Albumin/Globulin Ratio: 1 (ref 0.9–2.2)
Albumin: 2.4 g/dL — ABNORMAL LOW (ref 3.5–5.0)
Alkaline Phosphatase: 173 U/L — ABNORMAL HIGH (ref 38–106)
BUN: 28 mg/dL (ref 9.0–28.0)
Bilirubin, Total: 0.2 mg/dL (ref 0.2–1.2)
CO2: 20 mEq/L — ABNORMAL LOW (ref 22–29)
Calcium: 7.8 mg/dL — ABNORMAL LOW (ref 7.9–10.2)
Chloride: 114 mEq/L — ABNORMAL HIGH (ref 100–111)
Creatinine: 1.4 mg/dL — ABNORMAL HIGH (ref 0.7–1.3)
Globulin: 2.4 g/dL (ref 2.0–3.6)
Glucose: 87 mg/dL (ref 70–100)
Potassium: 4.7 mEq/L (ref 3.5–5.1)
Protein, Total: 4.8 g/dL — ABNORMAL LOW (ref 6.0–8.3)
Sodium: 139 mEq/L (ref 136–145)

## 2017-12-07 LAB — CBC
Absolute NRBC: 0 10*3/uL (ref 0.00–0.00)
Hematocrit: 26.8 % — ABNORMAL LOW (ref 37.6–49.6)
Hgb: 7.8 g/dL — ABNORMAL LOW (ref 12.5–17.1)
MCH: 30.2 pg (ref 25.1–33.5)
MCHC: 29.1 g/dL — ABNORMAL LOW (ref 31.5–35.8)
MCV: 103.9 fL — ABNORMAL HIGH (ref 78.0–96.0)
MPV: 10.2 fL (ref 8.9–12.5)
Nucleated RBC: 0 /100 WBC (ref 0.0–0.0)
Platelets: 274 10*3/uL (ref 142–346)
RBC: 2.58 10*6/uL — ABNORMAL LOW (ref 4.20–5.90)
RDW: 16 % — ABNORMAL HIGH (ref 11–15)
WBC: 7.09 10*3/uL (ref 3.10–9.50)

## 2017-12-07 LAB — GFR: EGFR: 47.8

## 2017-12-07 MED ORDER — PEG 3350-KCL-NABCB-NACL-NASULF 236 G PO SOLR
4000.00 mL | Freq: Once | ORAL | Status: AC
Start: 2017-12-07 — End: 2017-12-07
  Administered 2017-12-07: 18:00:00 4000 mL via ORAL
  Filled 2017-12-07: qty 4000

## 2017-12-07 MED ORDER — PEG 3350-KCL-NABCB-NACL-NASULF 236 G PO SOLR
2000.00 mL | Freq: Once | ORAL | Status: AC
Start: 2017-12-08 — End: 2017-12-08
  Administered 2017-12-08: 04:00:00 2000 mL via ORAL
  Filled 2017-12-07: qty 2000

## 2017-12-07 NOTE — Progress Notes (Addendum)
Medicine Shift Note    Patient Lines/Drains/Airways Status    Active Lines, Drains and Airways     Name:   Placement date:   Placement time:   Site:   Days:    Peripheral IV 12/03/17 Right Antecubital  12/03/17    1508    Antecubital    4                Last BM: 9/18    Pending Orders: EGD on 9/19    Discharge Plan: Home when cleared    POC: Patient    Skin: R. elbow abrasion    Tele: Yes    Activity: ambulate with 1 person assist    PT/OT: 9/16    Interpreter Needs: No    Shift Note: Pt A&Ox 4, denies pain, VSS, IVF cont', pt to started on Gol[place] this evening. Pt had an episode of nosebleed this evening, MD made aware. All safety precautions in place per protocol.

## 2017-12-07 NOTE — PT Progress Note (Signed)
Physical Therapy Cancellation Note      Patient:  [name] MRN#:  [id number]  Unit:  [facility] TOWER 9 Room/Bed:  F936/F936.01    12/07/2017  Time: 10:37 AM    Pt not seen for physical therapy secondary to adamant refusal. Pt told therapist to "I'm not doing PT now get out of here." Will D/C PT orders. Thank you.      Guerry Minors,  PT  10:37 AM 12/07/2017  Pager [phone]

## 2017-12-07 NOTE — Progress Notes (Signed)
Medicine Shift Note    Patient Lines/Drains/Airways Status    Active Lines, Drains and Airways     Name:   Placement date:   Placement time:   Site:   Days:    Peripheral IV 12/03/17 Right Antecubital  12/03/17    1508    Antecubital    3                Last BM: 9/16    Pending Orders: Clear Liquid diet from 0500 for Endoscopy     Discharge Plan: Home when cleared    POC: Patient    Skin: R elbow abrasion    Tele: Yes    Activity: ambulate with 1 person assist    PT/OT: 9/16    Interpreter Needs: No    Shift Note: Pt a/o x 4, denies pain, VSS, all safety precautions in place per protocol.

## 2017-12-07 NOTE — Plan of Care (Signed)
Problem: Moderate/High Fall Risk Score >5  Goal: Patient will remain free of falls  Outcome: Progressing   12/07/17 1450   OTHER   High (Greater than 13) HIGH-Initiate use of floor mats as appropriate;HIGH-Consider use of low bed       Problem: Safety  Goal: Patient will be free from injury during hospitalization  Outcome: Progressing   12/07/17 1450   Goal/Interventions addressed this shift   Patient will be free from injury during hospitalization  Assess patient's risk for falls and implement fall prevention plan of care per policy;Use appropriate transfer methods;Provide and maintain safe environment;Include patient/ family/ care giver in decisions related to safety;Assess for patients risk for elopement and implement Elopement Risk Plan per policy;Hourly rounding;Ensure appropriate safety devices are available at the bedside     Goal: Patient will be free from infection during hospitalization  Outcome: Progressing   12/06/17 7564   Goal/Interventions addressed this shift   Free from Infection during hospitalization Assess and monitor for signs and symptoms of infection;Monitor lab/diagnostic results       Problem: Discharge Barriers  Goal: Patient will be discharged home or other facility with appropriate resources  Outcome: Progressing   12/06/17 3329   Goal/Interventions addressed this shift   Discharge to home or other facility with appropriate resources Provide appropriate patient education;Provide information on available health resources;Initiate discharge planning       Problem: Activity Intolerance /Fatigue  Goal: Fatigue Management  Outcome: Progressing   12/07/17 1450   Goal/Interventions addressed this shift   Fatigue management  Monitor and assess vital signs and oxygen saturation;Monitor hemoglobin and hematocrit;Increase activities as tolerated/progressive mobility;Provide assistance with self-care as needed       Problem: Altered GI Function  Goal: Fluid and electrolyte balance are  achieved/maintained  Outcome: Progressing   12/07/17 1450   Goal/Interventions addressed this shift   Fluid and electrolyte balance are achieved/maintained Monitor/assess lab values and report abnormal values;Provide adequate hydration;Assess and reassess fluid and electrolyte status;Monitor for muscle weakness;Monitor intake and output every shift     Goal: Elimination patterns are normal or improving  Outcome: Progressing   12/07/17 1450   Goal/Interventions addressed this shift   Elimination patterns are normal or improving Monitor for abdominal distension;Assess for normal bowel sounds;Reinforce education on foods that improve and complicate bowel movements and how activity and medications can affect bowel movements;Administer medications to improve bowel evacuation as prescribed;Administer treatments as ordered;Assess for signs and symptoms of bleeding. Report signs of bleeding to physician;Monitor for abdominal discomfort;Anticipate/assist with toileting needs;Report abnormal assessment to physician;Assess for flatus     Goal: No bleeding  Outcome: Progressing   12/06/17 5188   Goal/Interventions addressed this shift   No bleeding  Monitor and assess vitals and hemodynamic parameters;Monitor/assess lab values and report abnormal values;Assess for bruising/petechia       Problem: Compromised Hemodynamic Status  Goal: Vital signs and fluid balance maintained/improved  Outcome: Progressing   12/07/17 1450   Goal/Interventions addressed this shift   Vital signs and fluid balance are maintained/improved Monitor/assess vitals and hemodynamic parameters with position changes;Monitor intake and output. Notify LIP if urine output is less than 30 mL/hour.;Monitor/assess lab values and report abnormal values

## 2017-12-07 NOTE — Progress Notes (Addendum)
PROGRESS NOTE    Date Time: 12/07/17 10:40 AM  Patient Name: [name],[name]  Requesting Physician: Herbert Moors, MD        Assessment:   CKD 3  GI bleed  PAF   Hypotension   HLD  HX SLE  Hyperkalemia   AG MA with hypoalbuminemia   Plan:   Serum creatinine 1.4 stable   Continue sodium bicarb   Continue NS IVF   Case d/w patient and staff care coordinated   History:   [name] is a 84 y.o. male who presents to the hospital on 12/03/2017 with black tarry stools hx CKD 3 we are asked for management   No dysuria . Hematuria , frequency   No abdominal pain     Past Medical History:     Past Medical History:   Diagnosis Date   . Anemia     iron low   . DOE (dyspnea on exertion)     mild   . Glaucoma NEC    . Hypertensive disorder    . Lupus 1990   . Skin cancer     2006 removed mole   . Ulcerative colitis, chronic    . Vision abnormalities        Past Surgical History:     Past Surgical History:   Procedure Laterality Date   . CARDIAC SURGERY      3 stents   . EGD, COLONOSCOPY  04/03/2012    Procedure: EGD, COLONOSCOPY;  Surgeon: Lestine Mount, MD;  Location: [facility];  Service: Gastroenterology;  Laterality: N/A;   . EGD, COLONOSCOPY N/A 02/11/2017    Procedure: EGD, COLONOSCOPY;  Surgeon: Lestine Mount, MD;  Location: [facility];  Service: Gastroenterology;  Laterality: N/A;   . EYE SURGERY      cataract       Family History:   History reviewed. No pertinent family history.    Social History:     Social History     Social History   . Marital status: Married     Spouse name: N/A   . Number of children: N/A   . Years of education: N/A     Social History Main Topics   . Smoking status: Former Smoker     Packs/day: 1.00     Years: 12.00     Types: Cigarettes     Quit date: 12/18/1961   . Smokeless tobacco: Never Used   . Alcohol use 0.0 oz/week      Comment: 0.5oz/d   . Drug use: No   . Sexual activity: Not on file     Other Topics Concern   . Not on file     Social History Narrative   . No narrative on file        Allergies:   No Known Allergies    Medications:     Current Facility-Administered Medications   Medication Dose Route Frequency   . atorvastatin  80 mg Oral QHS   . pantoprazole  40 mg Intravenous Q12H SCH   . [START ON 12/08/2017] polyethylene glycol  2,000 mL Oral Once   . polyethylene glycol  4,000 mL Oral Once   . sodium bicarbonate  650 mg Oral TID       Review of Systems:   A comprehensive review of systems was: per HPI     Physical Exam:     Vitals:    12/07/17 0500   BP: 121/61   Pulse: 61   Resp:  16   Temp:    SpO2: 97%       Intake and Output Summary (Last 24 hours) at Date Time  No intake or output data in the 24 hours ending 12/07/17 1040  General appearance - alert, well appearing, and in no distress  Chest - clear to auscultation, no wheezes, rales or rhonchi, symmetric air entry  Heart - normal rate, regular rhythm, normal S1, S2, no murmurs, rubs, clicks or gallops  Abdomen - soft, nontender, nondistended, no masses or organomegaly  Neurological - alert, oriented, normal speech, no focal findings or movement disorder noted  Musculoskeletal - no joint tenderness, deformity or swelling  Extremities - peripheral pulses normal, no pedal edema, no clubbing or cyanosis      Labs Reviewed:     Results     Procedure Component Value Units Date/Time    Comprehensive metabolic panel [[id number]]  (Abnormal) Collected:  12/07/17 0317    Specimen:  Blood Updated:  12/07/17 0509     Glucose 87 mg/dL      BUN 40.9 mg/dL      Creatinine 1.4 (H) mg/dL      Sodium 811 mEq/L      Potassium 4.7 mEq/L      Chloride 114 (H) mEq/L      CO2 20 (L) mEq/L      Calcium 7.8 (L) mg/dL      Protein, Total 4.8 (L) g/dL      Albumin 2.4 (L) g/dL      AST (SGOT) 16 U/L      ALT 13 U/L      Alkaline Phosphatase 173 (H) U/L      Bilirubin, Total 0.2 mg/dL      Globulin 2.4 g/dL      Albumin/Globulin Ratio 1.0    GFR [[id number]] Collected:  12/07/17 0317     Updated:  12/07/17 0509     EGFR 47.8    CBC [[id number]]  (Abnormal)  Collected:  12/07/17 0317    Specimen:  Blood from Blood Updated:  12/07/17 0456     WBC 7.09 x10 3/uL      RBC 2.58 (L) x10 6/uL      Hgb 7.8 (L) g/dL      Hematocrit 46.9 (L) %      MCV 103.9 (H) fL      MCH 30.2 pg      MCHC 29.1 (L) g/dL      RDW 16 (H) %      Platelets 274 x10 3/uL      MPV 10.2 fL      Nucleated RBC 0.0 /100 WBC      Absolute NRBC 0.00 x10 3/uL     Prepare/Crossmatch Red Blood Cells:  One Unit [[id number]] Collected:  12/03/17 1507     Updated:  12/07/17 0044     RBC Leukoreduced RBC Leukoreduced     BLUNIT [id number]     Status transfused     PRODUCT CODE (NON READABLE) E0336V00     Expiration Date [id number]     UTYPE O POS    Prepare/Crossmatch Red Blood Cells:  One Unit [[id number]] Collected:  12/03/17 1507     Updated:  12/07/17 0044     RBC Leukoreduced RBC Leukoreduced     BLUNIT [id number]     Status transfused     PRODUCT CODE (NON READABLE) E0382V00     Expiration Date [id number]     UTYPE O POS  Rads:   Radiological Procedure reviewed.     Signed by: Gaynelle Arabian MD  [id number]

## 2017-12-07 NOTE — Progress Notes (Signed)
MEDICINE PROGRESS NOTE    Date Time: 12/07/17 4:30 PM  Patient Name: [name],[name]  Attending Physician: Herbert Moors, MD    Assessment:   GI bleed, possible upper GI. H/o angiectasia stomach, duodenum and colon. Awaitng for PUSH enteroscopy.  Dizziness, improving.  Paroxysmal A.fib, not on AC. GI bleed  CAD, S/p stent.Plavix on hold.  H/o angiectasis  Hypotension  H/o Hypertension  Anemia, acute on chronic, blood loss  Macrocytosis  Hyperlipidemia  H/o SLE  Dizziness  Renal failure, acute on chronic, GI bleed  Metabolic acidosis  Hyperkalemia, Kayexalate.    Plan:   GI bleed: Evaluated by GI. Patient was on Plavix. On hold. Possible endoscopy on Thursday.  Anemia: Two units of blood given. Monitor. Transfuse as needed.  Hypotension: Improving. Monitor.  AKI: Improving  DVT prophylaxis    Case discussed with: Patient,staff and consultant.    Safety Checklist:     DVT prophylaxis:  CHEST guideline (See page e199S) Mechanical   Foley:  [place] Rn Foley protocol Not present   IVs:  Peripheral IV   PT/OT: Ordered   Daily CBC & or Chem ordered:  SHM/ABIM guidelines (see #5) Yes, due to clinical and lab instability   Reference for approximate charges of common labs: CBC auto diff - $76  BMP - $99  Mg - $79    Lines:     Patient Lines/Drains/Airways Status    Active PICC Line / CVC Line / PIV Line / Drain / Airway / Intraosseous Line / Epidural Line / ART Line / Line / Wound / Pressure Ulcer / NG/OG Tube     Name:   Placement date:   Placement time:   Site:   Days:    Peripheral IV 12/03/17 Right Antecubital  12/03/17    1508    Antecubital    1                 Disposition: (Please see PAF column for Expected D/C Date)   Today's date: 12/07/2017  Admit Date: 12/03/2017  2:04 PM  LOS: 2  Clinical Milestones: Stable Hemoglobin, Push Enteroscopy  Anticipated discharge needs: Home health care      Subjective     CC: No sob    Interval History/24 hour events: Patient remains afebrile. Awaiting for  enteroscopy.    HPI/Subjective: Please see H&P     Review of Systems:     No chest pain  No palpitation  No abdominal distention  No headache    Physical Exam:     VITAL SIGNS PHYSICAL EXAM   Temp:  [97 F (36.1 C)-97.3 F (36.3 C)] 97 F (36.1 C)  Heart Rate:  [59-61] 59  Resp Rate:  [16-18] 18  BP: (105-123)/(42-61) 123/42  Blood Glucose:872    Telemetry: A.fib    No intake or output data in the 24 hours ending 12/07/17 1630 Physical Exam  General: awake, alert X oriented  Cardiovascular: regular rate and rhythm, no murmurs, rubs or gallops  Lungs: clear to auscultation bilaterally, without wheezing, rhonchi, or rales  Abdomen: soft, non-tender, non-distended; no palpable masses,  normoactive bowel sounds  Extremities: no edema  CNS; No focal changes        Meds:     Medications were reviewed:  Current Facility-Administered Medications   Medication Dose Route Frequency   . atorvastatin  80 mg Oral QHS   . pantoprazole  40 mg Intravenous Q12H SCH   . [START ON 12/08/2017] polyethylene glycol  2,000  mL Oral Once   . polyethylene glycol  4,000 mL Oral Once   . sodium bicarbonate  650 mg Oral TID       Labs:     Labs (last 72 hours):      Recent Labs  Lab 12/07/17  0317 12/06/17  0339   WBC 7.09 5.67   Hgb 7.8* 7.5*   Hematocrit 26.8* 24.6*   Platelets 274 265            Recent Labs  Lab 12/07/17  0317 12/06/17  0339   Sodium 139 139   Potassium 4.7 4.9   Chloride 114* 115*   CO2 20* 18*   BUN 28.0 34.0*   Creatinine 1.4* 1.6*   Calcium 7.8* 7.6*   Albumin 2.4* 2.4*   Protein, Total 4.8* 4.7*   Bilirubin, Total 0.2 0.2   Alkaline Phosphatase 173* 168*   ALT 13 13   AST (SGOT) 16 14   Glucose 87 102*                   Microbiology, reviewed      Imaging, reviewed        Signed by: Herbert Moors, MD

## 2017-12-08 ENCOUNTER — Encounter
Admission: EM | Disposition: A | Discharge status: Home / Self Care | Payer: Self-pay | Source: Home / Self Care | Attending: Internal Medicine

## 2017-12-08 ENCOUNTER — Inpatient Hospital Stay: Payer: Medicare Other | Admitting: Anesthesiology

## 2017-12-08 HISTORY — PX: EGD, COLONOSCOPY: SHX3799

## 2017-12-08 LAB — BASIC METABOLIC PANEL
BUN: 18 mg/dL (ref 9.0–28.0)
CO2: 20 mEq/L — ABNORMAL LOW (ref 22–29)
Calcium: 8.4 mg/dL (ref 7.9–10.2)
Chloride: 112 mEq/L — ABNORMAL HIGH (ref 100–111)
Creatinine: 1.2 mg/dL (ref 0.7–1.3)
Glucose: 89 mg/dL (ref 70–100)
Potassium: 4.7 mEq/L (ref 3.5–5.1)
Sodium: 142 mEq/L (ref 136–145)

## 2017-12-08 LAB — CBC
Absolute NRBC: 0 10*3/uL (ref 0.00–0.00)
Hematocrit: 32.3 % — ABNORMAL LOW (ref 37.6–49.6)
Hgb: 9.4 g/dL — ABNORMAL LOW (ref 12.5–17.1)
MCH: 30 pg (ref 25.1–33.5)
MCHC: 29.1 g/dL — ABNORMAL LOW (ref 31.5–35.8)
MCV: 103.2 fL — ABNORMAL HIGH (ref 78.0–96.0)
MPV: 9.8 fL (ref 8.9–12.5)
Nucleated RBC: 0 /100 WBC (ref 0.0–0.0)
Platelets: 354 10*3/uL — ABNORMAL HIGH (ref 142–346)
RBC: 3.13 10*6/uL — ABNORMAL LOW (ref 4.20–5.90)
RDW: 15 % (ref 11–15)
WBC: 7.23 10*3/uL (ref 3.10–9.50)

## 2017-12-08 LAB — GFR: EGFR: 57.1

## 2017-12-08 SURGERY — EGD, COLONOSCOPY
Anesthesia: Anesthesia General | Site: Abdomen

## 2017-12-08 MED ORDER — SODIUM CHLORIDE 0.9 % IV SOLN
Freq: Once | INTRAVENOUS | Status: AC
Start: 2017-12-08 — End: 2017-12-08

## 2017-12-08 MED ORDER — GLYCOPYRROLATE 0.2 MG/ML IJ SOLN
INTRAMUSCULAR | Status: AC
Start: 2017-12-08 — End: ?
  Filled 2017-12-08: qty 1

## 2017-12-08 MED ORDER — GLYCOPYRROLATE 0.2 MG/ML IJ SOLN
INTRAMUSCULAR | Status: DC | PRN
Start: 2017-12-08 — End: 2017-12-08
  Administered 2017-12-08: .2 mg via INTRAVENOUS

## 2017-12-08 MED ORDER — PHENYLEPHRINE 100 MCG/ML IN NACL 0.9% IV SOSY
PREFILLED_SYRINGE | INTRAVENOUS | Status: AC
Start: 2017-12-08 — End: ?
  Filled 2017-12-08: qty 10

## 2017-12-08 MED ORDER — EPHEDRINE SULFATE 50 MG/ML IJ/IV SOLN (WRAP)
Status: DC | PRN
Start: 2017-12-08 — End: 2017-12-08
  Administered 2017-12-08: 10 mg via INTRAVENOUS

## 2017-12-08 MED ORDER — PHENYLEPHRINE 100 MCG/ML IN NACL 0.9% IV SOSY
PREFILLED_SYRINGE | INTRAVENOUS | Status: DC | PRN
Start: 2017-12-08 — End: 2017-12-08
  Administered 2017-12-08 (×2): 100 ug via INTRAVENOUS

## 2017-12-08 MED ORDER — PROPOFOL 10 MG/ML IV EMUL (WRAP)
INTRAVENOUS | Status: AC
Start: 2017-12-08 — End: ?
  Filled 2017-12-08: qty 100

## 2017-12-08 MED ORDER — PROPOFOL 10 MG/ML IV EMUL (WRAP)
INTRAVENOUS | Status: DC | PRN
Start: 2017-12-08 — End: 2017-12-08
  Administered 2017-12-08: 20 mg via INTRAVENOUS
  Administered 2017-12-08: 10 mg via INTRAVENOUS
  Administered 2017-12-08: 60 mg via INTRAVENOUS
  Administered 2017-12-08 (×2): 20 mg via INTRAVENOUS
  Administered 2017-12-08: 10 mg via INTRAVENOUS
  Administered 2017-12-08 (×2): 20 mg via INTRAVENOUS
  Administered 2017-12-08: 60 mg via INTRAVENOUS
  Administered 2017-12-08 (×3): 20 mg via INTRAVENOUS
  Administered 2017-12-08: 30 mg via INTRAVENOUS
  Administered 2017-12-08 (×2): 20 mg via INTRAVENOUS

## 2017-12-08 MED ORDER — LACTATED RINGERS IV SOLN
INTRAVENOUS | Status: DC | PRN
Start: 2017-12-08 — End: 2017-12-08

## 2017-12-08 MED ORDER — EPHEDRINE SULFATE 50 MG/ML IJ/IV SOLN (WRAP)
Status: AC
Start: 2017-12-08 — End: ?
  Filled 2017-12-08: qty 1

## 2017-12-08 MED ORDER — SIMETHICONE 40 MG/0.6ML PO SUSP
ORAL | Status: DC | PRN
Start: 2017-12-08 — End: 2017-12-08
  Administered 2017-12-08: 20 mg via ORAL

## 2017-12-08 SURGICAL SUPPLY — 32 items
BLOCK BITE MAXI 60FR LF STRD STRAP SDPRT (Procedure Accessories) ×1
BLOCK BITE OD60 FR STURDY STRAP SIDEPORT (Procedure Accessories) ×1 IMPLANT
BLOCK BITE OD60 FR STURDY STRAP SIDEPORT DENTAL RETENTION RIM MAXI (Procedure Accessories) ×1 IMPLANT
CANISTER SCT 1500CC SAFELINER LF NS SMRG (Suction) ×1
CANISTER SUCTION 1500 CC SEMIRIGID 1 (Suction) ×1
CANISTER SUCTION 1500 CC SEMIRIGID 1 ELBOW FILTER SELF ALIGN LID (Suction) ×1 IMPLANT
GLOVE EXAM LARGE NITRILE CHEMOTHERAPY POWDER FREE SENSE OATMEAL (Glove) ×1 IMPLANT
GLOVE EXAM LARGE NITRILE POWDER FREE SENSE OATMEAL (Glove) ×1 IMPLANT
GLOVE EXAM NITRILE RESTORE LG (Glove) ×1 IMPLANT
GLV EXAM NITRILE RESTORE LG (Glove) ×2
GOWN ISL PP PE REG LG LF FULL BCK NK TIE (Gown) ×2
GOWN ISOLATION REGULAR LARGE FULL BACK NECK TIE ELASTIC CUFF (Gown) ×1 IMPLANT
KIT ENDO W/ ORCA AND SEAL ONLY (Kits) ×1
KIT ENDOSCOPIC COMPLIANCE ENDOKIT (Kits) ×1
KIT ENDOSCOPIC COMPLIANCE ENDOKIT ORCAPOD 3 1.1 OZ (Kits) ×1 IMPLANT
PAD ABC GROUNDING BLUE (Laser Supplies) ×1 IMPLANT
PROBE COAG FIAPC 6.9FR 7.2FT CRCMF PLG (Endoscopic Supplies) ×1
PROBE COAGULATION L7.2 FT (Endoscopic Supplies) ×1
PROBE COAGULATION L7.2 FT CIRCUMFERENTIAL PLUG PLAY FUNCTIONALITY (Endoscopic Supplies) IMPLANT
PROBE ELECTROSURGICAL L220 CM FLEXIBLE (Procedure Accessories) ×1
PROBE ELECTROSURGICAL L220 CM FLEXIBLE STRAIGHT FIRE OD2.3 MM FIAPC (Procedure Accessories) IMPLANT
PROBE ESURG FIAPC 2.3MM 220CM STRL FLXB (Procedure Accessories) ×1
SYRINGE 50 ML GRADUATE NONPYROGENIC DEHP (Syringes, Needles) ×1 IMPLANT
SYRINGE 50 ML GRADUATE NONPYROGENIC DEHP FREE PVC FREE BD MEDICAL (Syringes, Needles) ×1 IMPLANT
SYRINGE MED 50ML LF STRL GRAD N-PYRG (Syringes, Needles) ×1
TUBING ENDOSCOPY EXTENSION (Endoscopic Supplies) ×2 IMPLANT
TUBING SCT UNV 3/16IN 12FT LF STRL F (Tubing) ×1
TUBING SUCTION OD3/16 IN L12 FT FEMALE (Tubing) ×1
TUBING SUCTION OD3/16 IN L12 FT FEMALE CONNECTOR RIBBED UNIVERSAL (Tubing) IMPLANT
WATER STERILE PLASTIC POUR BOTTLE 1000 (Irrigation Solutions) ×1
WATER STERILE PLASTIC POUR BOTTLE 1000 ML (Irrigation Solutions) ×2 IMPLANT
WATER STRL 1000ML LF PLS PR BTL (Irrigation Solutions) ×1

## 2017-12-08 NOTE — Progress Notes (Signed)
PROGRESS NOTE    Date Time: 12/08/17 9:54 AM  Patient Name: [name],[name]  Requesting Physician: Herbert Moors, MD        Assessment:   CKD 3  GI bleed  PAF   Hypotension   HLD  HX SLE  Hyperkalemia   AG MA with hypoalbuminemia   Plan:   Labs pending   Continue sodium bicarbonate   EGD per GI   Case d/w patient and staff care coordinated   History:   [name] is a 84 y.o. male who presents to the hospital on 12/03/2017 with black tarry stools hx CKD 3 we are asked for management   No dysuria . Hematuria , frequency   No abdominal pain     Past Medical History:     Past Medical History:   Diagnosis Date   . Anemia     iron low   . DOE (dyspnea on exertion)     mild   . Glaucoma NEC    . Hypertensive disorder    . Lupus 1990   . Skin cancer     2006 removed mole   . Ulcerative colitis, chronic    . Vision abnormalities        Past Surgical History:     Past Surgical History:   Procedure Laterality Date   . CARDIAC SURGERY      3 stents   . EGD, COLONOSCOPY  04/03/2012    Procedure: EGD, COLONOSCOPY;  Surgeon: Lestine Mount, MD;  Location: [facility];  Service: Gastroenterology;  Laterality: N/A;   . EGD, COLONOSCOPY N/A 02/11/2017    Procedure: EGD, COLONOSCOPY;  Surgeon: Lestine Mount, MD;  Location: [facility];  Service: Gastroenterology;  Laterality: N/A;   . EYE SURGERY      cataract       Family History:   History reviewed. No pertinent family history.    Social History:     Social History     Social History   . Marital status: Married     Spouse name: N/A   . Number of children: N/A   . Years of education: N/A     Social History Main Topics   . Smoking status: Former Smoker     Packs/day: 1.00     Years: 12.00     Types: Cigarettes     Quit date: 12/18/1961   . Smokeless tobacco: Never Used   . Alcohol use 0.0 oz/week      Comment: 0.5oz/d   . Drug use: No   . Sexual activity: Not on file     Other Topics Concern   . Not on file     Social History Narrative   . No narrative on file       Allergies:    No Known Allergies    Medications:     Current Facility-Administered Medications   Medication Dose Route Frequency   . atorvastatin  80 mg Oral QHS   . pantoprazole  40 mg Intravenous Q12H SCH   . sodium bicarbonate  650 mg Oral TID       Review of Systems:   A comprehensive review of systems was: per HPI     Physical Exam:     Vitals:    12/08/17 0747   BP: 104/54   Pulse:    Resp: 16   Temp: 97.8 F (36.6 C)   SpO2: 98%       Intake and Output Summary (Last 24 hours)  at Date Time    Intake/Output Summary (Last 24 hours) at 12/08/17 0954  Last data filed at 12/07/17 2350   Gross per 24 hour   Intake              600 ml   Output              450 ml   Net              150 ml     General appearance - alert, well appearing, and in no distress  Chest - clear to auscultation, no wheezes, rales or rhonchi, symmetric air entry  Heart - normal rate, regular rhythm, normal S1, S2, no murmurs, rubs, clicks or gallops  Abdomen - soft, nontender, nondistended, no masses or organomegaly  Neurological - alert, oriented, normal speech, no focal findings or movement disorder noted  Musculoskeletal - no joint tenderness, deformity or swelling  Extremities - peripheral pulses normal, no pedal edema, no clubbing or cyanosis      Labs Reviewed:     Results     ** No results found for the last 24 hours. **          Rads:   Radiological Procedure reviewed.     Signed by: Schuyler Amor MD  [id number]

## 2017-12-08 NOTE — Progress Notes (Addendum)
CASE MANAGEMENT PROGRESS NOTE      Patient: [name], [name]  DOB: 1927-06-24, 84 y.o., Male  MRN: [id number]  CSN: [id number]  Admission Date:  12/03/2017  2:04 PM  Hospital Day     Active Hospital Problem List  Active Hospital Problems    Diagnosis   . GI bleed   . Anemia       Length of stay: Hospital Day     EGD completed. Per Dr. Georga Bora, the patient is not stable for discharge today. Case manager to support discharge needs.     Consuello Masse, RN, [facility]-   Case Management Department  [facility]  [address]  [place], [place] [address]  [phone]  12/08/2017   3:27 PM

## 2017-12-08 NOTE — Plan of Care (Signed)
Problem: Moderate/High Fall Risk Score >5  Goal: Patient will remain free of falls  Outcome: Progressing   12/07/17 2000   OTHER   High (Greater than 13) HIGH-Activate bed/chair exit alarm where available;HIGH-Apply yellow "Fall Risk" arm band;HIGH-Initiate use of floor mats as appropriate;HIGH-Consider use of low bed       Problem: Safety  Goal: Patient will be free from injury during hospitalization  Outcome: Progressing   12/08/17 0140   Goal/Interventions addressed this shift   Patient will be free from injury during hospitalization  Assess patient's risk for falls and implement fall prevention plan of care per policy;Provide and maintain safe environment;Use appropriate transfer methods;Ensure appropriate safety devices are available at the bedside;Include patient/ family/ care giver in decisions related to safety;Hourly rounding     Goal: Patient will be free from infection during hospitalization  Outcome: Progressing   12/08/17 0140   Goal/Interventions addressed this shift   Free from Infection during hospitalization Assess and monitor for signs and symptoms of infection;Monitor lab/diagnostic results       Problem: Discharge Barriers  Goal: Patient will be discharged home or other facility with appropriate resources  Outcome: Progressing   12/08/17 0140   Goal/Interventions addressed this shift   Discharge to home or other facility with appropriate resources Provide appropriate patient education       Problem: Activity Intolerance /Fatigue  Goal: Fatigue Management  Outcome: Progressing   12/08/17 0140   Goal/Interventions addressed this shift   Fatigue management  Provide assistance with self-care as needed;Encourage relaxation techniques;Energy management;Monitor and assess vital signs and oxygen saturation;Monitor hemoglobin and hematocrit;Increase activities as tolerated/progressive mobility       Problem: Altered GI Function  Goal: Fluid and electrolyte balance are achieved/maintained  Outcome:  Progressing   12/08/17 0140   Goal/Interventions addressed this shift   Fluid and electrolyte balance are achieved/maintained Monitor intake and output every shift;Monitor/assess lab values and report abnormal values;Assess for confusion/personality changes;Assess and reassess fluid and electrolyte status;Observe for cardiac arrhythmias;Monitor for muscle weakness     Goal: Elimination patterns are normal or improving  Outcome: Progressing   12/08/17 0140   Goal/Interventions addressed this shift   Elimination patterns are normal or improving Report abnormal assessment to physician;Anticipate/assist with toileting needs;Assess for normal bowel sounds;Monitor for abdominal distension;Monitor for abdominal discomfort;Assess for signs and symptoms of bleeding. Report signs of bleeding to physician;Administer treatments as ordered;Assess for flatus;Encourage /perform oral hygiene as appropriate     Goal: No bleeding  Outcome: Progressing   12/08/17 0140   Goal/Interventions addressed this shift   No bleeding  Monitor and assess vitals and hemodynamic parameters;Monitor/assess lab values and report abnormal values;Assess for bruising/petechia       Problem: Compromised Hemodynamic Status  Goal: Vital signs and fluid balance maintained/improved  Outcome: Progressing   12/08/17 0140   Goal/Interventions addressed this shift   Vital signs and fluid balance are maintained/improved Position patient for maximum circulation/cardiac output;Monitor/assess vitals and hemodynamic parameters with position changes;Monitor intake and output. Notify LIP if urine output is less than 30 mL/hour.;Monitor/assess lab values and report abnormal values

## 2017-12-08 NOTE — Anesthesia Postprocedure Evaluation (Signed)
Anesthesia Post Evaluation    Patient: [name]    Procedure(s):  EGD, COLONOSCOPY    Anesthesia type: general    Last Vitals:   Vitals:    12/08/17 1402   BP: 132/76   Pulse: 72   Resp: 17   Temp: 36.7 C (98.1 F)   SpO2: 98%       Anesthesia Post Evaluation:                   Anesthetic complications: No          Cardiovascular status: acceptable  Respiratory status: acceptable  Hydration status: acceptable        Anesthesia Qualified Clinical Data Registry 2018    PACU Reintubation  Did the Patient have general anesthesia with intubation: No        PONV Adult  Is the patient aged 49 or older: Yes  Did the patient receive recieve a general anesthestic: Yes          PONV Pediatric  Is the patient aged 16-17? No            PACU Transfer Checklist Protocol  Was the patient transferred to the PACU at the conclusion of surgery? Yes  Was a checklist or transfer protocol used? Yes    ICU Transfer Checklist Protocol  Was the patient transferred to the ICU at the conclusion of surgery? No      Post-op Pain Assessment Prior to Anesthesia Care End          Perioperative Mortality  Perioperative mortality prior to Anesthesia end time: No    Perioperative Cardiac Arrest  Did the patient have an unanticipated intraoperative cardiac arrest between anesthesia start time and anesthesia end time? No    Unplanned Admission to ICU  Did the patient have an unplanned admission to the ICU (not initially anticipated at anesthesia start time)? No      Signed by: Glenna Fellows, 12/08/2017 3:44 PM

## 2017-12-08 NOTE — Transfer of Care (Signed)
Anesthesia Transfer of Care Note    Patient: [name]    Procedures performed: Procedure(s):  EGD, COLONOSCOPY    Anesthesia type: General TIVA    Patient location:Phase I PACU    Last vitals:   Vitals:    12/08/17 1136   BP: 122/53   Pulse: (!) 55   Resp: 20   Temp: 36.3 C (97.3 F)   SpO2: 100%       Post pain: Patient not complaining of pain, continue current therapy      Mental Status:sedated    Respiratory Function: tolerating room air    Cardiovascular: stable    Nausea/Vomiting: patient not complaining of nausea or vomiting    Hydration Status: adequate    Post assessment: no apparent anesthetic complications    Signed by: Glenna Fellows  12/08/17 12:55 PM

## 2017-12-08 NOTE — Progress Notes (Signed)
Pt off unit to [facility], VSS, finished both gallon of goLytely. Stool clear.CHG bath given.

## 2017-12-08 NOTE — Plan of Care (Signed)
Problem: Moderate/High Fall Risk Score >5  Goal: Patient will remain free of falls  Outcome: Progressing   12/08/17 1357   OTHER   High (Greater than 13) HIGH-Apply yellow "Fall Risk" arm band;HIGH-Initiate use of floor mats as appropriate;HIGH-Consider use of low bed       Problem: Safety  Goal: Patient will be free from injury during hospitalization  Outcome: Progressing   12/08/17 1357   Goal/Interventions addressed this shift   Patient will be free from injury during hospitalization  Assess patient's risk for falls and implement fall prevention plan of care per policy;Provide and maintain safe environment;Ensure appropriate safety devices are available at the bedside;Include patient/ family/ care giver in decisions related to safety;Hourly rounding;Use appropriate transfer methods     Goal: Patient will be free from infection during hospitalization  Outcome: Progressing   12/08/17 1357   Goal/Interventions addressed this shift   Free from Infection during hospitalization Assess and monitor for signs and symptoms of infection;Monitor lab/diagnostic results       Problem: Discharge Barriers  Goal: Patient will be discharged home or other facility with appropriate resources  Outcome: Progressing   12/08/17 0140   Goal/Interventions addressed this shift   Discharge to home or other facility with appropriate resources Provide appropriate patient education       Problem: Activity Intolerance /Fatigue  Goal: Fatigue Management  Outcome: Progressing   12/08/17 1357   Goal/Interventions addressed this shift   Fatigue management  Provide assistance with self-care as needed;Encourage relaxation techniques;Energy management;Monitor and assess vital signs and oxygen saturation;Monitor hemoglobin and hematocrit;Increase activities as tolerated/progressive mobility       Problem: Altered GI Function  Goal: Fluid and electrolyte balance are achieved/maintained  Outcome: Progressing   12/08/17 1357   Goal/Interventions addressed  this shift   Fluid and electrolyte balance are achieved/maintained Monitor intake and output every shift;Provide adequate hydration;Assess for confusion/personality changes;Monitor/assess lab values and report abnormal values;Assess and reassess fluid and electrolyte status     Goal: Elimination patterns are normal or improving  Outcome: Progressing   12/08/17 1357   Goal/Interventions addressed this shift   Elimination patterns are normal or improving Report abnormal assessment to physician;Anticipate/assist with toileting needs;Assess for normal bowel sounds;Monitor for abdominal distension;Monitor for abdominal discomfort;Assess for signs and symptoms of bleeding. Report signs of bleeding to physician;Reinforce education on foods that improve and complicate bowel movements and how activity and medications can affect bowel movements;Encourage /perform oral hygiene as appropriate     Goal: No bleeding  Outcome: Progressing   12/08/17 0140   Goal/Interventions addressed this shift   No bleeding  Monitor and assess vitals and hemodynamic parameters;Monitor/assess lab values and report abnormal values;Assess for bruising/petechia       Problem: Compromised Hemodynamic Status  Goal: Vital signs and fluid balance maintained/improved  Outcome: Progressing   12/08/17 1357   Goal/Interventions addressed this shift   Vital signs and fluid balance are maintained/improved Position patient for maximum circulation/cardiac output;Monitor/assess lab values and report abnormal values;Monitor/assess vitals and hemodynamic parameters with position changes;Monitor intake and output. Notify LIP if urine output is less than 30 mL/hour.

## 2017-12-08 NOTE — Plan of Care (Signed)
Problem: Moderate/High Fall Risk Score >5  Goal: Patient will remain free of falls  Outcome: Progressing   12/08/17 2030   OTHER   High (Greater than 13) HIGH-Activate bed/chair exit alarm where available;HIGH-Apply yellow "Fall Risk" arm band;HIGH-Pharmacy to initiate evaluation and intervention per protocol;HIGH-Initiate use of floor mats as appropriate;HIGH-Consider use of low bed       Problem: Safety  Goal: Patient will be free from injury during hospitalization  Outcome: Progressing   12/08/17 1357   Goal/Interventions addressed this shift   Patient will be free from injury during hospitalization  Assess patient's risk for falls and implement fall prevention plan of care per policy;Provide and maintain safe environment;Ensure appropriate safety devices are available at the bedside;Include patient/ family/ care giver in decisions related to safety;Hourly rounding;Use appropriate transfer methods     Goal: Patient will be free from infection during hospitalization  Outcome: Progressing   12/08/17 2051   Goal/Interventions addressed this shift   Free from Infection during hospitalization Assess and monitor for signs and symptoms of infection;Monitor lab/diagnostic results       Problem: Activity Intolerance /Fatigue  Goal: Fatigue Management  Outcome: Progressing   12/08/17 1357   Goal/Interventions addressed this shift   Fatigue management  Provide assistance with self-care as needed;Encourage relaxation techniques;Energy management;Monitor and assess vital signs and oxygen saturation;Monitor hemoglobin and hematocrit;Increase activities as tolerated/progressive mobility       Problem: Altered GI Function  Goal: Fluid and electrolyte balance are achieved/maintained  Outcome: Progressing   12/08/17 1357   Goal/Interventions addressed this shift   Fluid and electrolyte balance are achieved/maintained Monitor intake and output every shift;Provide adequate hydration;Assess for confusion/personality  changes;Monitor/assess lab values and report abnormal values;Assess and reassess fluid and electrolyte status     Goal: Elimination patterns are normal or improving  Outcome: Progressing   12/08/17 1357   Goal/Interventions addressed this shift   Elimination patterns are normal or improving Report abnormal assessment to physician;Anticipate/assist with toileting needs;Assess for normal bowel sounds;Monitor for abdominal distension;Monitor for abdominal discomfort;Assess for signs and symptoms of bleeding. Report signs of bleeding to physician;Reinforce education on foods that improve and complicate bowel movements and how activity and medications can affect bowel movements;Encourage /perform oral hygiene as appropriate     Goal: No bleeding  Outcome: Progressing   12/08/17 2051   Goal/Interventions addressed this shift   No bleeding  Monitor and assess vitals and hemodynamic parameters;Monitor/assess lab values and report abnormal values;Assess for bruising/petechia       Problem: Compromised Hemodynamic Status  Goal: Vital signs and fluid balance maintained/improved  Outcome: Progressing   12/08/17 1357   Goal/Interventions addressed this shift   Vital signs and fluid balance are maintained/improved Position patient for maximum circulation/cardiac output;Monitor/assess lab values and report abnormal values;Monitor/assess vitals and hemodynamic parameters with position changes;Monitor intake and output. Notify LIP if urine output is less than 30 mL/hour.

## 2017-12-08 NOTE — PACU (Signed)
Dr Ernst Bowler spoke to patient. Report given to RN Loussica.

## 2017-12-08 NOTE — Progress Notes (Signed)
GI PRE PROCEDURE NOTE    Proceduralist Comments:   Review of Systems and Past Medical / Surgical History performed: Yes     Indications:Gastrointestinal bleeding    Previous Adverse Reaction to Anesthesia or Sedation (if yes, describe): No    Physical Exam / Laboratory Data (If applicable)   Airway Classification: Class II    General: Alert and cooperative  Lungs: Lungs clear to auscultation  Cardiac: RRR, normal S1S2.    Abdomen: Soft, non tender. Normal active bowel sounds  Other:     No labs drawn    American Society of Anesthesiologists (ASA) Physical Status Classification:   ASA 3    Planned Sedation:   Deep sedation with anesthesia    Attestation:   [name] has been reassessed immediately prior to the procedure and is an appropriate candidate for the planned sedation and procedure. Risks, benefits and alternatives to the planned procedure and sedation have been explained to the patient or guardian:  yes        Signed by: Jacqulyn Cane

## 2017-12-08 NOTE — Progress Notes (Signed)
Medicine Shift Note    Patient Lines/Drains/Airways Status    Active Lines, Drains and Airways     Name:   Placement date:   Placement time:   Site:   Days:    Peripheral IV 12/03/17 Right Antecubital  12/03/17    1508    Antecubital    4                Last BM: 12/08/17    Pending Orders: enteroscopy    Discharge Plan: TBD    POC: patient    Skin: See chart    Tele: Yes, Sinus [place] Foley with 1st degree AV block    Activity: standby assist x1.     PT/OT: PT 12/05/17    Interpreter Needs: no    Shift Note: Pt AOx4. Pt denies pain. Golytely administered, pt consumed the first 4 liters of Golytely. Pt refused to leave the toilet from 2000 to 2245. Pt BP 86/59 at 0415 on 12/08/17. Pt asymptomatic. Provider notified, new order received for a bolus of NS at 140ml/hr. Pt currently sleeping. LN encouraged patient to continue with the bowel prep. Fall precautions maintained. Call light within reach and purposeful rounding maintained. Will continue to monitor for any changes in condition.

## 2017-12-08 NOTE — Progress Notes (Signed)
MEDICINE PROGRESS NOTE    Date Time: 12/08/17 7:37 PM  Patient Name: [name],[name]  Attending Physician: Herbert Moors, MD    Assessment:   GI bleed, possible upper GI. H/o angiectasia stomach, duodenum and colon. S/p Colonoscopy and EGD.  Few non bleeding angiectasia seen in colon. Largee number of Ectasia in Stomach with few showing active plasma oozing..  Dizziness, improving.  Paroxysmal A.fib, not on AC. GI bleed  CAD, S/p stent.Plavix on hold.  H/o angiectasis  Hypotension  H/o Hypertension  Anemia, acute on chronic, blood loss  Macrocytosis  Hyperlipidemia  H/o SLE  Dizziness  Renal failure, acute on chronic, GI bleed  Metabolic acidosis  Hyperkalemia, Kayexalate.    Plan:   GI bleed: Evaluated by GI. Patient was on Plavix. On hold. EGD showed bleeding ectasia amd coagulated..  Anemia: Two units of blood given. Monitor. Transfuse as needed.  Hypotension: Improving. Monitor.  AKI: Improving  DVT prophylaxis  Discussed with Cardiology regarding Plavix therapy.    Case discussed with: Patient,staff and consultant.    Safety Checklist:     DVT prophylaxis:  CHEST guideline (See page [id number]) Mechanical   Foley:  [place] Rn Foley protocol Not present   IVs:  Peripheral IV   PT/OT: Ordered   Daily CBC & or Chem ordered:  SHM/ABIM guidelines (see #5) Yes, due to clinical and lab instability   Reference for approximate charges of common labs: CBC auto diff - $76  BMP - $99  Mg - $79    Lines:     Patient Lines/Drains/Airways Status    Active PICC Line / CVC Line / PIV Line / Drain / Airway / Intraosseous Line / Epidural Line / ART Line / Line / Wound / Pressure Ulcer / NG/OG Tube     Name:   Placement date:   Placement time:   Site:   Days:    Peripheral IV 12/03/17 Right Antecubital  12/03/17    1508    Antecubital    1                 Disposition: (Please see PAF column for Expected D/C Date)   Today's date: 12/08/2017  Admit Date: 12/03/2017  2:04 PM  LOS: 3  Clinical Milestones: Stable Hemoglobin, Push  Enteroscopy  Anticipated discharge needs: Home health care      Subjective     CC: No fever    Interval History/24 hour events: Patient tolerated the procedure well.Marland Kitchen    HPI/Subjective: Please see H&P     Review of Systems:     No nausea  No chest pain  No cough  No headache    Physical Exam:     VITAL SIGNS PHYSICAL EXAM   Temp:  [96 F (35.6 C)-98.7 F (37.1 C)] 98.3 F (36.8 C)  Heart Rate:  [54-95] 61  Resp Rate:  [16-20] 16  BP: (86-132)/(48-76) 100/48  Blood Glucose:89    Telemetry: A.fib      Intake/Output Summary (Last 24 hours) at 12/08/17 1937  Last data filed at 12/08/17 1245   Gross per 24 hour   Intake             1100 ml   Output                0 ml   Net             1100 ml    Physical Exam  General: awake, alert X oriented  Cardiovascular:  regular rate and rhythm, no murmurs, rubs or gallops  Lungs: clear to auscultation bilaterally, without wheezing, rhonchi, or rales  Abdomen: soft, non-tender, non-distended; no palpable masses,  normoactive bowel sounds  Extremities: no edema  CNS; No focal changes        Meds:     Medications were reviewed:  Current Facility-Administered Medications   Medication Dose Route Frequency   . atorvastatin  80 mg Oral QHS   . pantoprazole  40 mg Intravenous Q12H SCH   . sodium bicarbonate  650 mg Oral TID       Labs:     Labs (last 72 hours):      Recent Labs  Lab 12/08/17  1036 12/07/17  0317   WBC 7.23 7.09   Hgb 9.4* 7.8*   Hematocrit 32.3* 26.8*   Platelets 354* 274            Recent Labs  Lab 12/08/17  1036 12/07/17  0317 12/06/17  0339   Sodium 142 139 139   Potassium 4.7 4.7 4.9   Chloride 112* 114* 115*   CO2 20* 20* 18*   BUN 18.0 28.0 34.0*   Creatinine 1.2 1.4* 1.6*   Calcium 8.4 7.8* 7.6*   Albumin  --  2.4* 2.4*   Protein, Total  --  4.8* 4.7*   Bilirubin, Total  --  0.2 0.2   Alkaline Phosphatase  --  173* 168*   ALT  --  13 13   AST (SGOT)  --  16 14   Glucose 89 87 102*                   Microbiology, reviewed      Imaging, reviewed        Signed by:  Herbert Moors, MD

## 2017-12-08 NOTE — Progress Notes (Signed)
Medicine Shift Note    Patient Lines/Drains/Airways Status    Active Lines, Drains and Airways     Name:   Placement date:   Placement time:   Site:   Days:    Peripheral IV 12/03/17 Right Antecubital  12/03/17    1508    Antecubital    4              Last BM: 9/19    Pending Orders: advance diet as tolerated    Discharge Plan: Home when cleared    POC: Patient    Skin: R. elbow abrasion    Tele: Yes    Activity: ambulate with 1 person assist    PT/OT: 9/16, refused pt/ot on 9/18    Interpreter Needs: No    Shift Note: Pt A&Ox 4, denies pain, VSS, came back from [facility], started on clear liquid diet. All safety precautions in place per protocol. Hourly rounding done. Will continue to monitor.

## 2017-12-08 NOTE — Progress Notes (Signed)
Received pt form [facility], VSS, denies pain. Clear liquid diet per [facility] nurse.

## 2017-12-08 NOTE — Anesthesia Preprocedure Evaluation (Addendum)
Anesthesia Evaluation    AIRWAY    Mallampati: II    TM distance: >3 FB  Neck ROM: full  Mouth Opening:full   CARDIOVASCULAR    cardiovascular exam normal       DENTAL    no notable dental hx     PULMONARY    pulmonary exam normal     OTHER FINDINGS                  Relevant Problems   No relevant active problems       PSS Anesthesia Comments: No problems with anesthesia  No family history of anesthesia problems        Anesthesia Plan    ASA 2     general                     intravenous induction   Detailed anesthesia plan: general IV        Post op pain management: per surgeon    informed consent obtained      pertinent labs reviewed             Signed by: Glenna Fellows 12/08/17 11:57 AM

## 2017-12-09 ENCOUNTER — Encounter: Payer: Self-pay | Admitting: Gastroenterology

## 2017-12-09 DIAGNOSIS — K922 Gastrointestinal hemorrhage, unspecified: Secondary | ICD-10-CM

## 2017-12-09 DIAGNOSIS — D500 Iron deficiency anemia secondary to blood loss (chronic): Secondary | ICD-10-CM

## 2017-12-09 DIAGNOSIS — R040 Epistaxis: Secondary | ICD-10-CM

## 2017-12-09 DIAGNOSIS — D518 Other vitamin B12 deficiency anemias: Secondary | ICD-10-CM

## 2017-12-09 LAB — CBC
Absolute NRBC: 0 10*3/uL (ref 0.00–0.00)
Hematocrit: 29.9 % — ABNORMAL LOW (ref 37.6–49.6)
Hgb: 8.9 g/dL — ABNORMAL LOW (ref 12.5–17.1)
MCH: 30.3 pg (ref 25.1–33.5)
MCHC: 29.8 g/dL — ABNORMAL LOW (ref 31.5–35.8)
MCV: 101.7 fL — ABNORMAL HIGH (ref 78.0–96.0)
MPV: 9.9 fL (ref 8.9–12.5)
Nucleated RBC: 0 /100 WBC (ref 0.0–0.0)
Platelets: 321 10*3/uL (ref 142–346)
RBC: 2.94 10*6/uL — ABNORMAL LOW (ref 4.20–5.90)
RDW: 15 % (ref 11–15)
WBC: 7.89 10*3/uL (ref 3.10–9.50)

## 2017-12-09 LAB — BASIC METABOLIC PANEL
BUN: 15 mg/dL (ref 9.0–28.0)
CO2: 21 mEq/L — ABNORMAL LOW (ref 22–29)
Calcium: 8.2 mg/dL (ref 7.9–10.2)
Chloride: 111 mEq/L (ref 100–111)
Creatinine: 1.3 mg/dL (ref 0.7–1.3)
Glucose: 97 mg/dL (ref 70–100)
Potassium: 4.4 mEq/L (ref 3.5–5.1)
Sodium: 139 mEq/L (ref 136–145)

## 2017-12-09 LAB — VITAMIN B12: Vitamin B-12: 545 pg/mL (ref 211–911)

## 2017-12-09 LAB — PT AND APTT
PT INR: 1.1 (ref 0.9–1.1)
PT: 14.5 s (ref 12.6–15.0)
PTT: 31 s (ref 23–37)

## 2017-12-09 LAB — FIBRINOGEN: Fibrinogen: 399 mg/dL (ref 189–458)

## 2017-12-09 LAB — GFR: EGFR: 52

## 2017-12-09 LAB — HEMOLYSIS INDEX: Hemolysis Index: 6 (ref 0–18)

## 2017-12-09 MED ORDER — OXYMETAZOLINE HCL 0.05 % NA SOLN
2.00 | Freq: Once | NASAL | Status: AC
Start: 2017-12-09 — End: 2017-12-12
  Administered 2017-12-09: 11:00:00 2 via NASAL
  Filled 2017-12-09: qty 30

## 2017-12-09 MED ORDER — METOPROLOL SUCCINATE ER 25 MG PO TB24
25.00 mg | ORAL_TABLET | Freq: Every day | ORAL | Status: DC
Start: 2017-12-09 — End: 2017-12-12
  Administered 2017-12-09 – 2017-12-12 (×4): 25 mg via ORAL
  Filled 2017-12-09 (×4): qty 1

## 2017-12-09 NOTE — Consults (Signed)
[facility]  NP/PA Spectra [phone] and [id number]  Patient of Dr. Lucianne Muss         Referring Physician:  Herbert Moors, MD    Reason for Consultation:  [facility] recommendations in setting of GI bleed    Date of Service: 12/09/2017     Assessment and Plan:     Acute GI Bleed   - EGD and Colo demonstrated multiple mucosal angioectasias/AVMs done 12/08/17 (no evidence of AS on echo done 01/2017)  - GI has recommended to hold blood thinners for 5 days at this time, per discussion with team today. Appreciate recommendations.   - Further management per GI/primary team    Two-vessel obstructive CAD s/p PCI/DES to LM/LCx on 02/22/17  - No evidence of active ischemia at this time. Angina free  - Continue statin. Start Toprol 25mg  daily  - Resume Plavix in 5 days - defer to outpatient cardiology, if no further bleeding events (this morning had an episode of epistaxis)    Paroxysmal Atrial Fibrillation (CHADS2VASc 5)  - Currently in SR  - Start low dose BB for rate control  - Not on AC secondary to recurrent bleeding; therefore high risk of bleeding as noted above  - Consider Watchman as outpatient    Acute HFrEF 36% (via echo done 02/11/2017)  - Appears compensated on exam  - Optimize GDMT: BB added. Resume Lisinopril 5mg  daily if SBP can tolerate   - Repeat echo as outpatient to reassess LV function s/p revascularization done last December with optimal medical therapy    Moderate Pulmonary HTN ( via echo done 01/2017)  - Follow prospectively as outpatient    HTN  - Stable    HLD  - On statin    AKI  - Cr stable at 1.3 today, CrCl 32    Of note, patient requesting to follow with [facility]. Hashemi aware and made the referral to [place].    If able to tolerate BB, no further inpatient cardiac recommendations. Will see as needed, please call with questions or concerns. Follow up with Dr. Billie Ruddy in 1 week upon discharge.      Patient Active Problem List    Diagnosis Date Noted   . *HAnemia [D64.9]  02/09/2017   . Iron deficiency anemia secondary to inadequate dietary iron intake [D50.8] 02/22/2017   . Anemia in other chronic diseases classified elsewhere [D63.8] 02/22/2017   . HGI bleed [K92.2] 02/09/2017       Chief Complaint:     GI Bleeding    History of Present Illness:     [name] is a 84 y.o. male who presents with melena, noted to have GI bleeding with acute on chronic anemia. EGD and colonoscopy done 12/08/17 showed multiple mucosal angioectasias/AVMs that were treated with argon plasma concentration. Cardiology was consulted for [facility] recommendations. Patient has a history of PCI/DES to LM and LCx done 02/22/17. He was maintained on Plavix, however has now presented with melena/GI bleeding. Patient denies any cardiovascular symptoms, such as chest pain, dyspnea or palpitations. Currently he states he feels well.       Past History:     Past Medical History:   Diagnosis Date   . Anemia     iron low   . Black tarry stools 2019   . CKD (chronic kidney disease), stage III    . Coronary artery disease    . DOE (dyspnea on exertion)     mild   . Encounter for blood  transfusion    . Glaucoma NEC    . Hyperlipidemia    . Hypertensive disorder    . Macrocytosis    . Paroxysmal A-fib    . Skin cancer     2006 removed mole   . SLE (systemic lupus erythematosus) 1990   . Ulcerative colitis, chronic    . Vision abnormalities       Past Surgical History:   Procedure Laterality Date   . CARDIAC SURGERY      3 stents   . EGD, COLONOSCOPY  04/03/2012    Procedure: EGD, COLONOSCOPY;  Surgeon: Lestine Mount, MD;  Location: [facility];  Service: Gastroenterology;  Laterality: N/A;   . EGD, COLONOSCOPY N/A 02/11/2017    Procedure: EGD, COLONOSCOPY;  Surgeon: Lestine Mount, MD;  Location: [facility];  Service: Gastroenterology;  Laterality: N/A;   . EGD, COLONOSCOPY N/A 12/08/2017    Procedure: EGD, COLONOSCOPY;  Surgeon: Jacqulyn Cane, MD;  Location: [facility];  Service: Gastroenterology;  Laterality:  N/A;   . EYE SURGERY      cataract      Prescriptions Prior to Admission   Medication Sig Dispense Refill Last Dose   . furosemide (LASIX) 40 MG tablet Take 40 mg by mouth daily      . lisinopril (PRINIVIL,ZESTRIL) 5 MG tablet Take 5 mg by mouth daily      . pantoprazole (PROTONIX) 40 MG tablet Take 40 mg by mouth 2 (two) times daily      . atorvastatin (LIPITOR) 80 MG tablet Take 1 tablet (80 mg total) by mouth nightly. 30 tablet 1    . clopidogrel (PLAVIX) 75 mg tablet Take 1 tablet (75 mg total) by mouth daily. 30 tablet 1    . IRON PO Take 200 mg by mouth daily.   02/09/2017 at Unknown time     No Known Allergies   Current Facility-Administered Medications   Medication Dose Route Frequency Provider Last Rate Last Dose   . 0.9%  NaCl infusion   Intravenous PRN Herbert Moors, MD       . acetaminophen (TYLENOL) tablet 650 mg  650 mg Oral Q4H PRN Herbert Moors, MD        Or   . acetaminophen (TYLENOL) suppository 650 mg  650 mg Rectal Q4H PRN Herbert Moors, MD       . atorvastatin (LIPITOR) tablet 80 mg  80 mg Oral QHS Herbert Moors, MD   80 mg at 12/08/17 2120   . naloxone [facility]) injection 0.2 mg  0.2 mg Intravenous PRN Herbert Moors, MD       . oxymetazoline (AFRIN) 0.05 % nasal spray 2 spray  2 spray Each Nare Once Emmaline Kluver, MD   2 spray at 12/09/17 1056   . pantoprazole (PROTONIX) injection 40 mg  40 mg Intravenous Q12H [facility] Herbert Moors, MD   40 mg at 12/08/17 2034     Social History   Substance Use Topics   . Smoking status: Former Smoker     Packs/day: 1.00     Years: 12.00     Types: Cigarettes     Quit date: 12/18/1961   . Smokeless tobacco: Never Used   . Alcohol use 0.0 oz/week      Comment: 0.5oz/d        Family History:   History reviewed. No pertinent family history.       Review of Systems:     General: Denies fevers, chills, or sweats.  Denies fatigue,  weight gain, or weight loss.    Head:  Denies headaches, vision loss, eye pain.  ENT:  + nose bleed  Denies nasal congestion, sinus pain, sore throat, oral bleeding  Respiratory:  Denies cough, wheezing, or sputum production.    Cardiovascular:  See HPI.      Gastrointestinal:  + melena Denies abdominal pain, nausea, vomiting, diarrhea, constipation.   Neurological:  Denies lightheadedness, syncope, or focal weakness.    Musculoskeletal:  Denies arthritis or myalgias.   Genitourinary:  Denies dysuria or bladder problems.  Skin:  Denies rash, itching, or new lesions.    Hematologic:  Denies easy bruising or bleeding.    Psychiatric:  Denies depression or anxiety.      Physical Exam:     BP 123/67   Pulse 67   Temp 98.5 F (36.9 C) (Oral)   Resp 16   Ht 1.651 m (5\' 5" )   Wt 59 kg (130 lb)   SpO2 96%   BMI 21.63 kg/m     General:  Well-nourished, well-developed.  Alert and in no apparent distress.  Eyes:  Anicteric sclera.  Pupils equal and round.    ENT:  Hearing grossly intact. Lips moist, color appropriate for race.  Neck:  No JVD.  No LAD.  Lungs:  Clear to auscultation bilaterally. No wheezes or crackles.  Respiratory effort unlabored, chest expansion symmetric.  Cardiovascular:  Regular rate and rhythm. Normal S1 and S2.  No S3 or S4.  No murmurs or rubs.  Abdomen:  Soft, non-tender, non-distended.    Extremities:  2+ bilateral radial and pedal pulses.  No pedal edema.  Skin:  Warm, dry, and intact. No rashes or lesions on exposed portions of skin.  Neurologic:  Moving all four extremities; exam grossly non-focal      Ancillary Data:     Cardiac Data Review  Echo Results done 02/11/2017:    Summary    * The left ventricle is normal in size.    * Left ventricular systolic function is moderately decreased with an  ejection fraction by Biplane Method of Discs of  36 %.    * Left Ventricular diastolic filling parameters are consistent with Grade II  diastolic dysfunction (pseudonormal pattern).    * Right ventricular size and systolic function are normal.    * Mild bi-atrial enlargement.    * Moderate to  severe mitral valve regurgitation.    * There is mild tricuspid regurgitation.    * Moderate pulmonary hypertension with estimated right ventricular systolic  pressure of  63.29 mmHg.          EKG Results done 12/03/17:  NSR with frequent PACs 62 bpm       Labs:   Lab Results   Component Value Date    WBC 7.89 12/09/2017    HGB 8.9 (L) 12/09/2017    HCT 29.9 (L) 12/09/2017    MCV 101.7 (H) 12/09/2017    PLT 321 12/09/2017     No results found for: CKTOTAL, CKMB, CKMBINDEX  No results found for: CKTOTAL  Lab Results   Component Value Date    BUN 15.0 12/09/2017    NA 139 12/09/2017    K 4.4 12/09/2017    CL 111 12/09/2017    CO2 21 (L) 12/09/2017       Chest X-Ray:  Radiology Results (24 Hour)     ** No results found for the last 24 hours. **

## 2017-12-09 NOTE — Progress Notes (Signed)
HEMATOLOGY    CONSULT NOTE     Date Time: 12/09/17 12:23 PM  Patient Name: [name],[name]  Requesting Physician: Herbert Moors, MD     Reason for Consultation:   GI bleed    Assessment:   Multiple mucosal angioectasias throughout GI and likely upper respiratory tract.  Recurrent GI bleeding - resulting in anemia.  Iron and B12 deficiency    Recommendations:   Agree with transfusion strategy to maintain stable Hb at 8 g/dL.  Will be a candidate for outpatient parenteral iron (ferric carboxymaltose -Injectafer) and B12 (will start this after current B12 level results).  May be a candidate for bevacizumab (Avastin) - would be administered as an outpatient.    Will follow.    History:   [name] is a 84 y.o. male with a history of recurrent GI bleeding, found to have AVMs by prior ECD one year ago who presents to the hospital on 12/03/2017 with melena.  Also, now with nose bleed.  He is receiving his 4th unit of pRBCs.  Hb this am 8.9 g/dL. Previously shown to be iron and B12 deficient.         Past Medical History:     Past Medical History:   Diagnosis Date   . Anemia     iron low   . Black tarry stools 2019   . CKD (chronic kidney disease), stage III    . Coronary artery disease    . DOE (dyspnea on exertion)     mild   . Encounter for blood transfusion    . Glaucoma NEC    . Hyperlipidemia    . Hypertensive disorder    . Macrocytosis    . Paroxysmal A-fib    . Skin cancer     2006 removed mole   . SLE (systemic lupus erythematosus) 1990   . Ulcerative colitis, chronic    . Vision abnormalities        Past Surgical History:     Past Surgical History:   Procedure Laterality Date   . CARDIAC SURGERY      3 stents   . EGD, COLONOSCOPY  04/03/2012    Procedure: EGD, COLONOSCOPY;  Surgeon: Lestine Mount, MD;  Location: [facility];  Service: Gastroenterology;  Laterality: N/A;   . EGD, COLONOSCOPY N/A 02/11/2017    Procedure: EGD, COLONOSCOPY;  Surgeon: Lestine Mount, MD;  Location: [facility];  Service:  Gastroenterology;  Laterality: N/A;   . EGD, COLONOSCOPY N/A 12/08/2017    Procedure: EGD, COLONOSCOPY;  Surgeon: Jacqulyn Cane, MD;  Location: [facility];  Service: Gastroenterology;  Laterality: N/A;   . EYE SURGERY      cataract       Family History:   History reviewed. No pertinent family history.    Social History:     Social History     Social History   . Marital status: Married     Spouse name: N/A   . Number of children: N/A   . Years of education: N/A     Social History Main Topics   . Smoking status: Former Smoker     Packs/day: 1.00     Years: 12.00     Types: Cigarettes     Quit date: 12/18/1961   . Smokeless tobacco: Never Used   . Alcohol use 0.0 oz/week      Comment: 0.5oz/d   . Drug use: No   . Sexual activity: Not on file  Other Topics Concern   . Not on file     Social History Narrative   . No narrative on file       Allergies:   No Known Allergies    Medications:     Current Facility-Administered Medications   Medication Dose Route Frequency   . atorvastatin  80 mg Oral QHS   . oxymetazoline  2 spray Each Nare Once   . pantoprazole  40 mg Intravenous Q12H [facility]       Review of Systems:   \    General  no fever, chills, night sweats, generalized weakness or fatigue.   Heme: No bleeding, bruising, rash, or swelling in the neck, underarms or groin.  No pain or swelling in upper or lower extremities.  All other systems were reviewed and were negative except as indicated in the HPI.      Physical Exam:   Temp (24hrs), Avg:97.9 F (36.6 C), Min:96 F (35.6 C), Max:98.5 F (36.9 C)    Vitals:    12/09/17 0916   BP: 123/67   Pulse: 67   Resp: 16   Temp:    SpO2: 96%       General appearance -alert/oriented and in no distress   Eyes - sclerae anicteric, slight conjunctival pallor   Neck - supple,without adenopathy   Chest - clear to auscultation  Heart - normal rate, regular rhythm, without murmur   Abdomen - soft, nontender, nondistended, no masses or organomegaly   Extremities - no pedal  edema, no clubbing or cyanosis  Skin - without ecchymoses, petechiae or rash  Neurologic- without localizing deficit      Labs Reviewed:     Results     Procedure Component Value Units Date/Time    Basic Metabolic Panel [[id number]]  (Abnormal) Collected:  12/09/17 0937    Specimen:  Blood Updated:  12/09/17 1018     Glucose 97 mg/dL      BUN 40.9 mg/dL      Creatinine 1.3 mg/dL      Calcium 8.2 mg/dL      Sodium 811 mEq/L      Potassium 4.4 mEq/L      Chloride 111 mEq/L      CO2 21 (L) mEq/L     GFR [[id number]] Collected:  12/09/17 0937     Updated:  12/09/17 1018     EGFR 52.0    CBC without differential [[id number]]  (Abnormal) Collected:  12/09/17 0937    Specimen:  Blood from Blood Updated:  12/09/17 0959     WBC 7.89 x10 3/uL      Hgb 8.9 (L) g/dL      Hematocrit 46.9 (L) %      Platelets 321 x10 3/uL      RBC 2.94 (L) x10 6/uL      MCV 101.7 (H) fL      MCH 30.3 pg      MCHC 29.8 (L) g/dL      RDW 15 %      MPV 9.9 fL      Nucleated RBC 0.0 /100 WBC      Absolute NRBC 0.00 x10 3/uL           Rads:     Radiology Results (24 Hour)     ** No results found for the last 24 hours. **          Signed by:     Kimberlee Nearing Carney Bern, MD  HEMATOLOGY

## 2017-12-09 NOTE — Progress Notes (Signed)
PROGRESS NOTE    Date Time: 12/09/17 7:43 AM  Patient Name: [name],[name]  Requesting Physician: Herbert Moors, MD        Assessment:   CKD 3  GI bleed  PAF   Hypotension   HLD  HX SLE  Hyperkalemia   AG MA with hypoalbuminemia   Plan:   Serum creatinine stable 1.2   Discontinue sodium bicarb   Case d/w patient and staff care coordinated   History:   [name] is a 84 y.o. male who presents to the hospital on 12/03/2017 with black tarry stools hx CKD 3 we are asked for management   No dysuria . Hematuria , frequency   No abdominal pain     Past Medical History:     Past Medical History:   Diagnosis Date   . Anemia     iron low   . Black tarry stools 2019   . CKD (chronic kidney disease), stage III    . Coronary artery disease    . DOE (dyspnea on exertion)     mild   . Encounter for blood transfusion    . Glaucoma NEC    . Hyperlipidemia    . Hypertensive disorder    . Macrocytosis    . Paroxysmal A-fib    . Skin cancer     2006 removed mole   . SLE (systemic lupus erythematosus) 1990   . Ulcerative colitis, chronic    . Vision abnormalities        Past Surgical History:     Past Surgical History:   Procedure Laterality Date   . CARDIAC SURGERY      3 stents   . EGD, COLONOSCOPY  04/03/2012    Procedure: EGD, COLONOSCOPY;  Surgeon: Lestine Mount, MD;  [facility]: [facility];  Service: Gastroenterology;  Laterality: N/A;   . EGD, COLONOSCOPY N/A 02/11/2017    Procedure: EGD, COLONOSCOPY;  Surgeon: Lestine Mount, MD;  Location: [facility];  Service: Gastroenterology;  Laterality: N/A;   . EGD, COLONOSCOPY N/A 12/08/2017    Procedure: EGD, COLONOSCOPY;  Surgeon: Jacqulyn Cane, MD;  Location: [facility];  Service: Gastroenterology;  Laterality: N/A;   . EYE SURGERY      cataract       Family History:   History reviewed. No pertinent family history.    Social History:     Social History     Social History   . Marital status: Married     Spouse name: N/A   . Number of children: N/A   . Years of  education: N/A     Social History Main Topics   . Smoking status: Former Smoker     Packs/day: 1.00     Years: 12.00     Types: Cigarettes     Quit date: 12/18/1961   . Smokeless tobacco: Never Used   . Alcohol use 0.0 oz/week      Comment: 0.5oz/d   . Drug use: No   . Sexual activity: Not on file     Other Topics Concern   . Not on file     Social History Narrative   . No narrative on file       Allergies:   No Known Allergies    Medications:     Current Facility-Administered Medications   Medication Dose Route Frequency   . atorvastatin  80 mg Oral QHS   . pantoprazole  40 mg Intravenous Q12H [facility]   . sodium  bicarbonate  650 mg Oral TID       Review of Systems:   A comprehensive review of systems was: per HPI     Physical Exam:     Vitals:    12/09/17 0414   BP: 98/52   Pulse: 68   Resp: 16   Temp: 98 F (36.7 C)   SpO2: 95%       Intake and Output Summary (Last 24 hours) at Date Time    Intake/Output Summary (Last 24 hours) at 12/09/17 0743  Last data filed at 12/08/17 1245   Gross per 24 hour   Intake              500 ml   Output                0 ml   Net              500 ml     General appearance - alert, well appearing, and in no distress  Chest - clear to auscultation, no wheezes, rales or rhonchi, symmetric air entry  Heart - normal rate, regular rhythm, normal S1, S2, no murmurs, rubs, clicks or gallops  Abdomen - soft, nontender, nondistended, no masses or organomegaly  Neurological - alert, oriented, normal speech, no focal findings or movement disorder noted  Musculoskeletal - no joint tenderness, deformity or swelling  Extremities - peripheral pulses normal, no pedal edema, no clubbing or cyanosis      Labs Reviewed:     Results     Procedure Component Value Units Date/Time    Basic Metabolic Panel [[id number]]  (Abnormal) Collected:  12/08/17 1036    Specimen:  Blood Updated:  12/08/17 1117     Glucose 89 mg/dL      BUN 52.8 mg/dL      Creatinine 1.2 mg/dL      Calcium 8.4 mg/dL      Sodium 413 mEq/L       Potassium 4.7 mEq/L      Chloride 112 (H) mEq/L      CO2 20 (L) mEq/L     GFR [[id number]] Collected:  12/08/17 1036     Updated:  12/08/17 1117     EGFR 57.1    CBC without differential [[id number]]  (Abnormal) Collected:  12/08/17 1036    Specimen:  Blood from Blood Updated:  12/08/17 1101     WBC 7.23 x10 3/uL      Hgb 9.4 (L) g/dL      Hematocrit 74.2 (L) %      Platelets 354 (H) x10 3/uL      RBC 3.13 (L) x10 6/uL      MCV 103.2 (H) fL      MCH 30.0 pg      MCHC 29.1 (L) g/dL      RDW 15 %      MPV 9.8 fL      Nucleated RBC 0.0 /100 WBC      Absolute NRBC 0.00 x10 3/uL           Rads:   Radiological Procedure reviewed.     Signed by: Schuyler Amor MD  [phone]

## 2017-12-09 NOTE — Progress Notes (Signed)
Chaplain responded to rapid response called this morning. Patient was not seen secondary to rapid response and no family present. Chaplain available upon request.     Rev. Shaune Leeks, MDiv, [facility]  Staff Chaplain  Desk: [phone]  Pager: [phone]  [email or web address]    For emergencies page [phone]       Chaplain Service      Assessment:  Spiritual Assessment: No needs    Background:  Visit Type: Initial was made by Chaplain with patient, [name], based on Source: Rapid Response Team (RRT).  Present at Visit: patient, Technician.  Spiritual Care Provided to: patient only.  Length of Visit: 0-15 minutes .    Summary:  Reason for Request: Crisis care   Spiritual Care Interventions: Other (comment) (Check in)   Spiritual Care Outcomes: Unavailable

## 2017-12-09 NOTE — Progress Notes (Signed)
MEDICINE PROGRESS NOTE    Date Time: 12/09/17 4:48 PM  Patient Name: [name]  Attending Physician: Herbert Moors, MD    Assessment:   GI bleed, possible upper GI. H/o angiectasia stomach, duodenum and colon. S/p Colonoscopy and EGD.  Few non bleeding angiectasia seen in colon. Largee number of Ectasia in Stomach with few showing active plasma oozing..  Dizziness, improving.  Paroxysmal A.fib, not on AC. GI bleed  CAD, S/p stent.Plavix on hold.  H/o angiectasis  Hypotension  H/o Hypertension  Anemia, acute on chronic, blood loss, further drop in HCT.  Macrocytosis  Hyperlipidemia  H/o SLE  Dizziness  Renal failure, acute on chronic, GI bleed  Metabolic acidosis  Hyperkalemia, Kayexalate.  Epistaxis, evaluated by RR team. Bleeding stopped.    Plan:   GI bleed: Evaluated by GI. Patient was on Plavix. On hold. EGD showed bleeding ectasia amd coagulated..  Anemia: Two units of blood given. Monitor. Transfuse as needed. Drpo n HCT. Monitor for 24 hours.  Hypotension: Improving. Monitor.  AKI: Improving  DVT prophylaxis  Cardiology notes noted regarding Plavix therapy.  HHT: Hematology consult,discussed with him. Rare presentation at this age. Possible therapy as out patient. Iron and Vitamin B 12 replacement as out patient.  Possible discharge on 12/10/2017    Case discussed with: Patient,staff and consultant.    Safety Checklist:     DVT prophylaxis:  CHEST guideline (See page [id number]) Mechanical   Foley:  Traer Rn Foley protocol Not present   IVs:  Peripheral IV   PT/OT: Ordered   Daily CBC & or Chem ordered:  SHM/ABIM guidelines (see #5) Yes, due to clinical and lab instability   Reference for approximate charges of common labs: CBC auto diff - $76  BMP - $99  Mg - $79    Lines:     Patient Lines/Drains/Airways Status    Active PICC Line / CVC Line / PIV Line / Drain / Airway / Intraosseous Line / Epidural Line / ART Line / Line / Wound / Pressure Ulcer / NG/OG Tube     Name:   Placement date:   Placement  time:   Site:   Days:    Peripheral IV 12/03/17 Right Antecubital  12/03/17    1508    Antecubital    1                 Disposition: (Please see PAF column for Expected D/C Date)   Today's date: 12/09/2017  Admit Date: 12/03/2017  2:04 PM  LOS: 4  Clinical Milestones: Stable Hemoglobin, Push Enteroscopy  Anticipated discharge needs: Home health care      Subjective     CC: No active bleeding now    Interval History/24 hour events: Patient had epistaxis this AM. Recurrent episode. Evaluated by Hematology for therapy.    HPI/Subjective: Please see H&P     Review of Systems:     No fever  No vomiting  No rash  No sob    Physical Exam:     VITAL SIGNS PHYSICAL EXAM   Temp:  [97.1 F (36.2 C)-98.5 F (36.9 C)] 97.8 F (36.6 C)  Heart Rate:  [61-82] 82  Resp Rate:  [16-20] 16  BP: (94-132)/(48-75) 106/56  Blood Glucose:97    Telemetry: A.fib    No intake or output data in the 24 hours ending 12/09/17 1648 Physical Exam  General: awake, alert X oriented  Cardiovascular: regular rate and rhythm, no murmurs, rubs or gallops  Lungs:  clear to auscultation bilaterally, without wheezing, rhonchi, or rales  Abdomen: soft, non-tender, non-distended; no palpable masses,  normoactive bowel sounds  Extremities: no edema  CNS; No focal changes        Meds:     Medications were reviewed:  Current Facility-Administered Medications   Medication Dose Route Frequency   . atorvastatin  80 mg Oral QHS   . metoprolol succinate XL  25 mg Oral Daily   . oxymetazoline  2 spray Each Nare Once   . pantoprazole  40 mg Intravenous Q12H [facility]       Labs:     Labs (last 72 hours):      Recent Labs  Lab 12/09/17  0937 12/08/17  1036   WBC 7.89 7.23   Hgb 8.9* 9.4*   Hematocrit 29.9* 32.3*   Platelets 321 354*         Recent Labs  Lab 12/09/17  1351   PT 14.5   PT INR 1.1   PTT 31      Recent Labs  Lab 12/09/17  0937 12/08/17  1036 12/07/17  0317 12/06/17  0339   Sodium 139 142 139 139   Potassium 4.4 4.7 4.7 4.9   Chloride 111 112* 114* 115*   CO2 21*  20* 20* 18*   BUN 15.0 18.0 28.0 34.0*   Creatinine 1.3 1.2 1.4* 1.6*   Calcium 8.2 8.4 7.8* 7.6*   Albumin  --   --  2.4* 2.4*   Protein, Total  --   --  4.8* 4.7*   Bilirubin, Total  --   --  0.2 0.2   Alkaline Phosphatase  --   --  173* 168*   ALT  --   --  13 13   AST (SGOT)  --   --  16 14   Glucose 97 89 87 102*                   Microbiology, reviewed      Imaging, reviewed        Signed by: Herbert Moors, MD

## 2017-12-09 NOTE — Plan of Care (Signed)
Problem: Moderate/High Fall Risk Score >5  Goal: Patient will remain free of falls  Outcome: Progressing   12/09/17 2042   OTHER   High (Greater than 13) HIGH-Activate bed/chair exit alarm where available;HIGH-Apply yellow "Fall Risk" arm band;HIGH-Initiate use of floor mats as appropriate       Problem: Safety  Goal: Patient will be free from injury during hospitalization  Outcome: Progressing   12/09/17 2042   Goal/Interventions addressed this shift   Patient will be free from injury during hospitalization  Assess patient's risk for falls and implement fall prevention plan of care per policy;Provide and maintain safe environment;Hourly rounding;Include patient/ family/ care giver in decisions related to safety;Use appropriate transfer methods     Goal: Patient will be free from infection during hospitalization  Outcome: Progressing   12/09/17 2042   Goal/Interventions addressed this shift   Free from Infection during hospitalization Assess and monitor for signs and symptoms of infection       Problem: Discharge Barriers  Goal: Patient will be discharged home or other facility with appropriate resources  Outcome: Progressing   12/09/17 2042   Goal/Interventions addressed this shift   Discharge to home or other facility with appropriate resources Provide appropriate patient education       Problem: Activity Intolerance /Fatigue  Goal: Fatigue Management  Outcome: Progressing   12/09/17 2042   Goal/Interventions addressed this shift   Fatigue management  Monitor and assess vital signs and oxygen saturation;Monitor hemoglobin and hematocrit       Problem: Altered GI Function  Goal: Fluid and electrolyte balance are achieved/maintained  Outcome: Progressing   12/09/17 2042   Goal/Interventions addressed this shift   Fluid and electrolyte balance are achieved/maintained Monitor/assess lab values and report abnormal values;Monitor intake and output every shift     Goal: Elimination patterns are normal or  improving  Outcome: Progressing   12/09/17 2042   Goal/Interventions addressed this shift   Elimination patterns are normal or improving Report abnormal assessment to physician;Anticipate/assist with toileting needs;Monitor for abdominal distension;Monitor for abdominal discomfort     Goal: No bleeding  Outcome: Progressing   12/09/17 2042   Goal/Interventions addressed this shift   No bleeding  Monitor/assess lab values and report abnormal values       Problem: Compromised Hemodynamic Status  Goal: Vital signs and fluid balance maintained/improved  Outcome: Progressing   12/09/17 2042   Goal/Interventions addressed this shift   Vital signs and fluid balance are maintained/improved Monitor/assess lab values and report abnormal values

## 2017-12-09 NOTE — RRT Follow Up Note (Signed)
[name] is a  84 y.o.  male admitted 12/03/2017 with .  The Rapid Response Team was activated on 12/09/17 at 9:14 for nose bleed.  Currently the patients mental status is alert.  Vital signs are:   Temperature 98.5 F (36.9 C) (Oral), heart rate  67, blood pressure 107/52, respirations 17, pulse ox 100. No further bleeding noted.

## 2017-12-09 NOTE — Progress Notes (Signed)
Medicine Shift Note    Patient Lines/Drains/Airways Status    Active Lines, Drains and Airways     Name:   Placement date:   Placement time:   Site:   Days:    Peripheral IV 12/03/17 Right Antecubital  12/03/17    1508    Antecubital    5                Last BM: 12/08/2017    Pending Orders: none    Discharge Plan: when medically stable    POC: Patient    Skin: R-elbow abrasion    Tele: Yes    Activity: ambulate with assist x1    PT/OT: 12/05/2017    Interpreter Needs: No    Shift Note: Pt A&Ox4. Vss,  Denies pain. Tolerating Clear liquid diet. Resting comfortably in bed. No other needs expressed at this time. All safety measures in place per protocol. Will continue to monitor.

## 2017-12-09 NOTE — Significant Event (Signed)
Rapid response called for epistaxis.     [name] is a 84 year old male with a PMH significant for angiectasia, paroxysmal afib (not on AC), and CAD (plavix on hold) who presented to the hospital with GI bleed from angiectasia in the stomach, duodenum and colon.   Rapid response called for epistaxis.     Patient reports feeling well without dizziness, chest pain or shortness of breath. Bleeding is evident from left nare that has significantly slowed. Vitals are stable.    Impression and Plan: Epistaxis likely from angiectasia. Now slowing. Patient is hemodynamically stable.   -Will check CBC  -Ordered Afrin spray for both nares  -Monitor for further bleeding.     Discussed with Dr. Aretha Parrot MD  PGY 2 Internal Medicine

## 2017-12-09 NOTE — Progress Notes (Signed)
Medicine Shift Note    Patient Lines/Drains/Airways Status    Active Lines, Drains and Airways     Name:   Placement date:   Placement time:   Site:   Days:    Peripheral IV 12/03/17 Right Antecubital  12/03/17    1508    Antecubital    6                Last BM: 12/09/2017    Pending Orders: N/A    Discharge Plan: Tomorrow possibly    POC: Patient and family    Skin: No    Tele: Yes    Activity: 1p assist OOB    PT/OT:12/05/2017    Interpreter Needs:No    Shift Note: A&Ox4. Uncontrolled nose bleed this morning with clots and rapid response was called. Nasal spray given and bleeding resolved. Another episode ocurred around 1900 with less bleeding and resolved on its own after holding pressure. Tolerated regular diet. Denies pain

## 2017-12-09 NOTE — Plan of Care (Signed)
Problem: Safety  Goal: Patient will be free from injury during hospitalization  Outcome: Progressing   12/09/17 1644   Goal/Interventions addressed this shift   Patient will be free from injury during hospitalization  Assess patient's risk for falls and implement fall prevention plan of care per policy;Provide and maintain safe environment;Ensure appropriate safety devices are available at the bedside;Include patient/ family/ care giver in decisions related to safety;Hourly rounding     Goal: Patient will be free from infection during hospitalization  Outcome: Progressing   12/09/17 1644   Goal/Interventions addressed this shift   Free from Infection during hospitalization Assess and monitor for signs and symptoms of infection;Monitor lab/diagnostic results       Problem: Activity Intolerance /Fatigue  Goal: Fatigue Management  Outcome: Progressing   12/09/17 1644   Goal/Interventions addressed this shift   Fatigue management  Provide assistance with self-care as needed;Encourage relaxation techniques;Energy management;Monitor hemoglobin and hematocrit       Problem: Altered GI Function  Goal: Fluid and electrolyte balance are achieved/maintained  Outcome: Progressing   12/09/17 1644   Goal/Interventions addressed this shift   Fluid and electrolyte balance are achieved/maintained Monitor intake and output every shift;Monitor/assess lab values and report abnormal values;Provide adequate hydration     Goal: Elimination patterns are normal or improving  Outcome: Progressing   12/09/17 1644   Goal/Interventions addressed this shift   Elimination patterns are normal or improving Report abnormal assessment to physician;Assess for normal bowel sounds;Assess for signs and symptoms of bleeding. Report signs of bleeding to physician;Monitor for abdominal discomfort     Goal: No bleeding  Outcome: Progressing   12/09/17 1644   Goal/Interventions addressed this shift   No bleeding  Monitor and assess vitals and hemodynamic  parameters;Monitor/assess lab values and report abnormal values

## 2017-12-10 ENCOUNTER — Inpatient Hospital Stay: Payer: Medicare Other

## 2017-12-10 LAB — BASIC METABOLIC PANEL
BUN: 19 mg/dL (ref 9.0–28.0)
CO2: 20 mEq/L — ABNORMAL LOW (ref 22–29)
Calcium: 7.7 mg/dL — ABNORMAL LOW (ref 7.9–10.2)
Chloride: 113 mEq/L — ABNORMAL HIGH (ref 100–111)
Creatinine: 1.2 mg/dL (ref 0.7–1.3)
Glucose: 93 mg/dL (ref 70–100)
Potassium: 4.4 mEq/L (ref 3.5–5.1)
Sodium: 139 mEq/L (ref 136–145)

## 2017-12-10 LAB — CBC
Absolute NRBC: 0 10*3/uL (ref 0.00–0.00)
Hematocrit: 26 % — ABNORMAL LOW (ref 37.6–49.6)
Hgb: 7.7 g/dL — ABNORMAL LOW (ref 12.5–17.1)
MCH: 30 pg (ref 25.1–33.5)
MCHC: 29.6 g/dL — ABNORMAL LOW (ref 31.5–35.8)
MCV: 101.2 fL — ABNORMAL HIGH (ref 78.0–96.0)
MPV: 10 fL (ref 8.9–12.5)
Nucleated RBC: 0 /100 WBC (ref 0.0–0.0)
Platelets: 235 10*3/uL (ref 142–346)
RBC: 2.57 10*6/uL — ABNORMAL LOW (ref 4.20–5.90)
RDW: 15 % (ref 11–15)
WBC: 7.33 10*3/uL (ref 3.10–9.50)

## 2017-12-10 LAB — GFR: EGFR: 57.1

## 2017-12-10 MED ORDER — FUROSEMIDE 10 MG/ML IJ SOLN
20.00 mg | Freq: Once | INTRAMUSCULAR | Status: AC
Start: 2017-12-10 — End: 2017-12-10
  Administered 2017-12-10: 14:00:00 20 mg via INTRAVENOUS
  Filled 2017-12-10: qty 4

## 2017-12-10 NOTE — Progress Notes (Signed)
Medicine Shift Note    Patient Lines/Drains/Airways Status    Active Lines, Drains and Airways     Name:   Placement date:   Placement time:   Site:   Days:    Peripheral IV 12/03/17 Right Antecubital   12/03/17    1508    Antecubital   6                Last BM: 9/20    Pending Orders: Labs    Discharge Plan: possibly today     POC: patient    Skin: R elbow abrasion    Tele: Yes    Activity: ambulates with x1 assist     PT/OT:  9/16; recommending home with supervision     Interpreter Needs: No    Shift Note: Pt A&Ox4, denies pain, had another nose blood this AM that resolved on it's own, VSS, fall precautions in place, call bell within reach. Will continue to monitor.

## 2017-12-10 NOTE — Plan of Care (Signed)
Problem: Moderate/High Fall Risk Score >5  Goal: Patient will remain free of falls  Outcome: Progressing  Flowsheets (Taken 12/10/2017 2211)  VH High Risk (Greater than 13): Use of floor mat;Include family/significant other in multidisciplinary discussion regarding plan of care as appropriate;BED ALARM WILL BE ACTIVATED WHEN THE PATEINT IS IN BED WITH SIGNAGE "RESET BED ALARM";PATIENT IS TO BE SUPERVISED FOR ALL TOILETING ACTIVITIES     Problem: Safety  Goal: Patient will be free from injury during hospitalization  Outcome: Progressing  Flowsheets (Taken 12/10/2017 2211)  Patient will be free from injury during hospitalization : Assess patient's risk for falls and implement fall prevention plan of care per policy;Provide and maintain safe environment;Use appropriate transfer methods;Hourly rounding  Goal: Patient will be free from infection during hospitalization  Outcome: Progressing  Flowsheets (Taken 12/09/2017 2042)  Free from Infection during hospitalization: Assess and monitor for signs and symptoms of infection     Problem: Discharge Barriers  Goal: Patient will be discharged home or other facility with appropriate resources  Outcome: Progressing  Flowsheets (Taken 12/10/2017 2225)  Discharge to home or other facility with appropriate resources: Provide appropriate patient education     Problem: Activity Intolerance /Fatigue  Goal: Fatigue Management  Outcome: Progressing  Flowsheets (Taken 12/10/2017 2225)  Fatigue management : Monitor and assess vital signs and oxygen saturation; Increase activities as tolerated/progressive mobility     Problem: Altered GI Function  Goal: Fluid and electrolyte balance are achieved/maintained  Outcome: Progressing  Flowsheets (Taken 12/09/2017 2042)  Fluid and electrolyte balance are achieved/maintained: Monitor/assess lab values and report abnormal values;Monitor intake and output every shift  Goal: Elimination patterns are normal or improving  Outcome: Progressing  Flowsheets  (Taken 12/09/2017 2042)  Elimination patterns are normal or improving: Report abnormal assessment to physician;Anticipate/assist with toileting needs;Monitor for abdominal distension;Monitor for abdominal discomfort  Goal: No bleeding  Outcome: Progressing  Flowsheets (Taken 12/09/2017 2042)  No bleeding : Monitor/assess lab values and report abnormal values     Problem: Compromised Hemodynamic Status  Goal: Vital signs and fluid balance maintained/improved  Outcome: Progressing  Flowsheets (Taken 12/09/2017 2042)  Vital signs and fluid balance are maintained/improved: Monitor/assess lab values and report abnormal values

## 2017-12-10 NOTE — Progress Notes (Signed)
Medicine Shift Note    Patient Lines/Drains/Airways Status    Active Lines, Drains and Airways     Name:   Placement date:   Placement time:   Site:   Days:    Peripheral IV 12/09/17 Right Antecubital   12/09/17    -    Antecubital   1                Last BM: 12/09/2017    Pending Orders: N/A    Discharge Plan: Home when medically stable    POC: Patient and family    Skin: Intact, bruising to BUE    Tele: Yes    Activity: 1p assist to bathroom, urinal    PT/OT: N/A    Interpreter Needs: No    Shift Note: A&Ox4. No further nose bleeds today. Expiratory wheezing noted and patient reported more congestion today. CXR performed and 20 mg IV Lasix given x1 dose. Wheezing has improved since. Continue to monitor H&H, did not require blood today.

## 2017-12-10 NOTE — Progress Notes (Signed)
MEDICINE PROGRESS NOTE    Date Time: 12/10/17 8:49 PM  Patient Name: [name],[name]  Attending Physician: Herbert Moors, MD    Assessment:   GI bleed, possible upper GI. H/o angiectasia stomach, duodenum and colon. S/p Colonoscopy and EGD.  Few non bleeding angiectasia seen in colon. Largee number of Ectasia in Stomach with few showing active plasma oozing..  Dizziness, improving.  Paroxysmal A.fib, not on AC. GI bleed  CAD, S/p stent.Plavix on hold.  H/o angiectasis  Hypotension  H/o Hypertension  Anemia, acute on chronic, blood loss, further drop in HCT.  Macrocytosis  Hyperlipidemia  H/o SLE  Dizziness  Renal failure, acute on chronic, GI bleed  Metabolic acidosis  Hyperkalemia, Kayexalate.  Epistaxis, evaluated by RR team. Bleeding stopped. Recurrent Epistaxis  SOB, Iv lasix given.    Plan:   GI bleed: Evaluated by GI. Patient was on Plavix. On hold. EGD showed bleeding ectasia amd coagulated..  Anemia: Two units of blood given. Monitor. Transfuse as needed. Drpo n HCT. Monitor for 24 hours.  Hypotension: Improving. Monitor.  AKI: Improving  DVT prophylaxis  Cardiology notes noted regarding Plavix therapy.  HHT: Hematology consult,discussed with him. Rare presentation at this age. Possible therapy as out patient. Iron and Vitamin B 12 replacement as out patient.  Possible discharge on 12/11/2017    Case discussed with: Patient,staff and consultant.    Safety Checklist:     DVT prophylaxis:  CHEST guideline (See page [id number]) Mechanical   Foley:  [place] Rn Foley protocol Not present   IVs:  Peripheral IV   PT/OT: Ordered   Daily CBC & or Chem ordered:  SHM/ABIM guidelines (see #5) Yes, due to clinical and lab instability   Reference for approximate charges of common labs: CBC auto diff - $76  BMP - $99  Mg - $79    Lines:     Patient Lines/Drains/Airways Status    Active PICC Line / CVC Line / PIV Line / Drain / Airway / Intraosseous Line / Epidural Line / ART Line / Line / Wound / Pressure Ulcer / NG/OG Tube      Name:   Placement date:   Placement time:   Site:   Days:    Peripheral IV 12/03/17 Right Antecubital  12/03/17    1508    Antecubital    1                 Disposition: (Please see PAF column for Expected D/C Date)   Today's date: 12/10/2017  Admit Date: 12/03/2017  2:04 PM  LOS: 5  Clinical Milestones: Stable Hemoglobin, Push Enteroscopy  Anticipated discharge needs: Home health care      Subjective     CC: No distress    Interval History/24 hour events: Patient had epistaxis this AM. Recurrent episode. Spontaneously stopped. Had sob. IV lasix given,. Chest X-ry showed congestion.    HPI/Subjective: Please see H&P     Review of Systems:     No fever  No cough  No chest pain  No headache    Physical Exam:     VITAL SIGNS PHYSICAL EXAM   Temp:  [97.9 F (36.6 C)-98.6 F (37 C)] 97.9 F (36.6 C)  Heart Rate:  [61-86] 68  Resp Rate:  [16-18] 18  BP: (99-127)/(52-63) 111/56  Blood Glucose:93    Telemetry: A.fib      Intake/Output Summary (Last 24 hours) at 12/10/2017 2049  Last data filed at 12/10/2017 1832  Gross per 24  hour   Intake -   Output 1525 ml   Net -1525 ml    Physical Exam  General: awake, alert X oriented  Cardiovascular: regular rate and rhythm, no murmurs, rubs or gallops  Lungs: clear to auscultation bilaterally, without wheezing, rhonchi, or rales  Abdomen: soft, non-tender, non-distended; no palpable masses,  normoactive bowel sounds  Extremities: no edema  CNS; No focal changes        Meds:     Medications were reviewed:  Current Facility-Administered Medications   Medication Dose Route Frequency   . atorvastatin  80 mg Oral QHS   . metoprolol succinate XL  25 mg Oral Daily   . oxymetazoline  2 spray Each Nare Once   . pantoprazole  40 mg Intravenous Q12H [facility]       Labs:     Labs (last 72 hours):    Recent Labs   Lab 12/10/17  0415 12/09/17  0937   WBC 7.33 7.89   Hgb 7.7* 8.9*   Hematocrit 26.0* 29.9*   Platelets 235 321       Recent Labs   Lab 12/09/17  1351   PT 14.5   PT INR 1.1   PTT 31     Recent Labs   Lab 12/10/17  0415 12/09/17  0937  12/07/17  0317 12/06/17  0339   Sodium 139 139  More results in Results Review 139 139   Potassium 4.4 4.4  More results in Results Review 4.7 4.9   Chloride 113* 111  More results in Results Review 114* 115*   CO2 20* 21*  More results in Results Review 20* 18*   BUN 19.0 15.0  More results in Results Review 28.0 34.0*   Creatinine 1.2 1.3  More results in Results Review 1.4* 1.6*   Calcium 7.7* 8.2  More results in Results Review 7.8* 7.6*   Albumin  --   --   --  2.4* 2.4*   Protein, Total  --   --   --  4.8* 4.7*   Bilirubin, Total  --   --   --  0.2 0.2   Alkaline Phosphatase  --   --   --  173* 168*   ALT  --   --   --  13 13   AST (SGOT)  --   --   --  16 14   Glucose 93 97  More results in Results Review 87 102*   More results in Results Review = values in this interval not displayed.                   Microbiology, reviewed      Imaging, reviewed        Signed by: Herbert Moors, MD

## 2017-12-10 NOTE — Plan of Care (Signed)
Contacted by [facility] Cardiology group yesterday re: resuming ASA/Plavix.  Discussed with Dr. Ernst Bowler, who performed EGD and colonoscopy.  Would ideally wait about 5 days from time of procedures (9/19) to resume these agents, but ultimately a balance of risk/benefits.  This was discussed with Cardiology.      -  Would also recommend a work up for HTT (hereditary hemorhagic telangectasia) at some point, given diffuse AVMs, if this has not already been pursued in the past.  This may be done as outpatient (?Hematology)  -  Also consider trial of octreotide injections to prevent rebleed from AVMs.  The patient should follow up with GI as outpatient to discuss this

## 2017-12-10 NOTE — Plan of Care (Signed)
Problem: Moderate/High Fall Risk Score >5  Goal: Patient will remain free of falls  Outcome: Progressing     Problem: Safety  Goal: Patient will be free from injury during hospitalization  Outcome: Progressing  Goal: Patient will be free from infection during hospitalization  Outcome: Progressing     Problem: Activity Intolerance /Fatigue  Goal: Fatigue Management  Outcome: Progressing     Problem: Altered GI Function  Goal: Fluid and electrolyte balance are achieved/maintained  Outcome: Progressing  Goal: Elimination patterns are normal or improving  Outcome: Progressing  Goal: No bleeding  Outcome: Progressing     Problem: Compromised Hemodynamic Status  Goal: Vital signs and fluid balance maintained/improved  Outcome: Progressing

## 2017-12-11 LAB — BASIC METABOLIC PANEL
BUN: 19 mg/dL (ref 9.0–28.0)
CO2: 22 mEq/L (ref 22–29)
Calcium: 7.8 mg/dL — ABNORMAL LOW (ref 7.9–10.2)
Chloride: 112 mEq/L — ABNORMAL HIGH (ref 100–111)
Creatinine: 1.2 mg/dL (ref 0.7–1.3)
Glucose: 89 mg/dL (ref 70–100)
Potassium: 4.3 mEq/L (ref 3.5–5.1)
Sodium: 140 mEq/L (ref 136–145)

## 2017-12-11 LAB — CBC
Absolute NRBC: 0 10*3/uL (ref 0.00–0.00)
Hematocrit: 26.9 % — ABNORMAL LOW (ref 37.6–49.6)
Hgb: 8 g/dL — ABNORMAL LOW (ref 12.5–17.1)
MCH: 30.1 pg (ref 25.1–33.5)
MCHC: 29.7 g/dL — ABNORMAL LOW (ref 31.5–35.8)
MCV: 101.1 fL — ABNORMAL HIGH (ref 78.0–96.0)
MPV: 10.3 fL (ref 8.9–12.5)
Nucleated RBC: 0 /100 WBC (ref 0.0–0.0)
Platelets: 225 10*3/uL (ref 142–346)
RBC: 2.66 10*6/uL — ABNORMAL LOW (ref 4.20–5.90)
RDW: 15 % (ref 11–15)
WBC: 6.31 10*3/uL (ref 3.10–9.50)

## 2017-12-11 LAB — GFR: EGFR: 57.1

## 2017-12-11 NOTE — Consults (Signed)
CONSULTATION    Date Time: 12/11/17 9:54 AM  Patient Name: [name],[name]  Requesting Physician: Herbert Moors, MD      Reason for Consultation:   Epistaxis    Assessment:   84 yo male with anemai and Right sided epistaxis.  He can control  It most of the time, but is a continuing issue and he has anemia.    Head and neck exam unremarkable  Large vessels R septum      Plan:   Topical local applied and then packed with nasopore    History:   [name] is a 84 y.o. male who presents to the hospital on 12/03/2017 with aGI bleed    Past Medical History:     Past Medical History:   Diagnosis Date   . Anemia     iron low   . Black tarry stools 2019   . CKD (chronic kidney disease), stage III    . Coronary artery disease    . DOE (dyspnea on exertion)     mild   . Encounter for blood transfusion    . Glaucoma NEC    . Hyperlipidemia    . Hypertensive disorder    . Macrocytosis    . Paroxysmal A-fib    . Skin cancer     2006 removed mole   . SLE (systemic lupus erythematosus) 1990   . Ulcerative colitis, chronic    . Vision abnormalities        Past Surgical History:     Past Surgical History:   Procedure Laterality Date   . CARDIAC SURGERY      3 stents   . EGD, COLONOSCOPY  04/03/2012    Procedure: EGD, COLONOSCOPY;  Surgeon: Lestine Mount, MD;  Location: [facility];  Service: Gastroenterology;  Laterality: N/A;   . EGD, COLONOSCOPY N/A 02/11/2017    Procedure: EGD, COLONOSCOPY;  Surgeon: Lestine Mount, MD;  Location: [facility];  Service: Gastroenterology;  Laterality: N/A;   . EGD, COLONOSCOPY N/A 12/08/2017    Procedure: EGD, COLONOSCOPY;  Surgeon: Jacqulyn Cane, MD;  Location: [facility];  Service: Gastroenterology;  Laterality: N/A;   . EYE SURGERY      cataract       Family History:   History reviewed. No pertinent family history.    Social History:     Social History     Socioeconomic History   . Marital status: Married     Spouse name: Not on file   . Number of children: Not on file   .  Years of education: Not on file   . Highest education level: Not on file   Occupational History   . Not on file   Social Needs   . Financial resource strain: Not on file   . Food insecurity:     Worry: Not on file     Inability: Not on file   . Transportation needs:     Medical: Not on file     Non-medical: Not on file   Tobacco Use   . Smoking status: Former Smoker     Packs/day: 1.00     Years: 12.00     Pack years: 12.00     Types: Cigarettes     Last attempt to quit: 12/18/1961     Years since quitting: 56.0   . Smokeless tobacco: Never Used   Substance and Sexual Activity   . Alcohol use: Yes     Alcohol/week: 0.0 standard drinks  Comment: 0.5oz/d   . Drug use: No   . Sexual activity: Not on file   Lifestyle   . Physical activity:     Days per week: Not on file     Minutes per session: Not on file   . Stress: Not on file   Relationships   . Social connections:     Talks on phone: Not on file     Gets together: Not on file     Attends religious service: Not on file     Active member of club or organization: Not on file     Attends meetings of clubs or organizations: Not on file     Relationship status: Not on file   . Intimate partner violence:     Fear of current or ex partner: Not on file     Emotionally abused: Not on file     Physically abused: Not on file     Forced sexual activity: Not on file   Other Topics Concern   . Not on file   Social History Narrative   . Not on file       Allergies:   No Known Allergies    Medications:     Current Facility-Administered Medications   Medication Dose Route Frequency   . atorvastatin  80 mg Oral QHS   . metoprolol succinate XL  25 mg Oral Daily   . oxymetazoline  2 spray Each Nare Once   . pantoprazole  40 mg Intravenous Q12H [facility]       Review of Systems:   A comprehensive review of systems was:  Positive for GI issues and epistaxis    Physical Exam:     Vitals:    12/11/17 0851   BP: 117/57   Pulse: 64   Resp:    Temp:    SpO2:        Intake and Output Summary (Last  24 hours) at Date Time    Intake/Output Summary (Last 24 hours) at 12/11/2017 0954  Last data filed at 12/11/2017 0600  Gross per 24 hour   Intake -   Output 2125 ml   Net -2125 ml        limited ot head and neck   Rsided epistaxis    Labs Reviewed:     Results     Procedure Component Value Units Date/Time    GFR [[id number]] Collected:  12/11/17 0756     Updated:  12/11/17 0831     EGFR 57.1    Basic Metabolic Panel [[id number]]  (Abnormal) Collected:  12/11/17 0756    Specimen:  Blood Updated:  12/11/17 0831     Glucose 89 mg/dL      BUN 78.2 mg/dL      Creatinine 1.2 mg/dL      Calcium 7.8 mg/dL      Sodium 956 mEq/L      Potassium 4.3 mEq/L      Chloride 112 mEq/L      CO2 22 mEq/L     CBC without differential [[id number]]  (Abnormal) Collected:  12/11/17 0756    Specimen:  Blood Updated:  12/11/17 0825     WBC 6.31 x10 3/uL      Hgb 8.0 g/dL      Hematocrit 46.9 %      Platelets 225 x10 3/uL      RBC 2.66 x10 6/uL      MCV 101.1 fL      MCH 30.1 pg  MCHC 29.7 g/dL      RDW 15 %      MPV 10.3 fL      Nucleated RBC 0.0 /100 WBC      Absolute NRBC 0.00 x10 3/uL               Rads:   Radiological Procedure reviewed.     Signed by: Jaquelyn Bitter

## 2017-12-11 NOTE — Progress Notes (Signed)
MEDICINE PROGRESS NOTE    Date Time: 12/11/17 1:17 PM  Patient Name: [name],[name]  Attending Physician: Herbert Moors, MD    Assessment:   GI bleed, possible upper GI. H/o angiectasia stomach, duodenum and colon. S/p Colonoscopy and EGD.  Few non bleeding angiectasia seen in colon. Largee number of Ectasia in Stomach with few showing active plasma oozing..  Dizziness, improving.  Paroxysmal A.fib, not on AC. GI bleed  CAD, S/p stent.Plavix on hold.  H/o angiectasis  Hypotension  H/o Hypertension  Anemia, acute on chronic, blood loss, further drop in HCT.  Macrocytosis  Hyperlipidemia  H/o SLE  Dizziness  Renal failure, acute on chronic, GI bleed  Metabolic acidosis  Hyperkalemia, Kayexalate.  Epistaxis, evaluated by RR team. Bleeding stopped. Recurrent Epistaxis  SOB, Iv lasix given.    Plan:   Epistaxis ENT evaluated with nose packing   Monitor for further bleeding check INR   Serum creatinine at baseline   H/h stable post transfusion   Hold AC due to epistaxis   PT And OT   Case discussed with: Patient,staff and care coordinated     Safety Checklist:     DVT prophylaxis:  CHEST guideline (See page e199S) Mechanical   Foley:  [place] Rn Foley protocol Not present   IVs:  Peripheral IV   PT/OT: Ordered   Daily CBC & or Chem ordered:  SHM/ABIM guidelines (see #5) Yes, due to clinical and lab instability   Reference for approximate charges of common labs: CBC auto diff - $76  BMP - $99  Mg - $79    Lines:     Patient Lines/Drains/Airways Status    Active PICC Line / CVC Line / PIV Line / Drain / Airway / Intraosseous Line / Epidural Line / ART Line / Line / Wound / Pressure Ulcer / NG/OG Tube     Name:   Placement date:   Placement time:   Site:   Days:    Peripheral IV 12/03/17 Right Antecubital  12/03/17    1508    Antecubital    1                 Disposition: (Please see PAF column for Expected D/C Date)   Today's date: 12/11/2017  Admit Date: 12/03/2017  2:04 PM  LOS: 6  Clinical Milestones: Stable  Hemoglobin, Push Enteroscopy  Anticipated discharge needs: Home health care      Subjective     CC: No distress    Interval History/24 hour events: Patient had epistaxis this AM. Recurrent episode. Spontaneously stopped. Had sob. IV lasix given,. Chest X-ry showed congestion.    HPI/Subjective: Please see H&P     Review of Systems:     No fever  No cough  No chest pain  No headache    Physical Exam:     VITAL SIGNS PHYSICAL EXAM   Temp:  [97.7 F (36.5 C)-98.6 F (37 C)] 97.7 F (36.5 C)  Heart Rate:  [64-85] 64  Resp Rate:  [16-18] 16  BP: (111-124)/(54-65) 123/59  Blood Glucose:93    Telemetry: A.fib      Intake/Output Summary (Last 24 hours) at 12/11/2017 1317  Last data filed at 12/11/2017 0600  Gross per 24 hour   Intake -   Output 2125 ml   Net -2125 ml    Physical Exam  General: awake, alert X oriented  Cardiovascular: regular rate and rhythm, no murmurs, rubs or gallops  Lungs: clear to auscultation bilaterally,  without wheezing, rhonchi, or rales  Abdomen: soft, non-tender, non-distended; no palpable masses,  normoactive bowel sounds  Extremities: no edema  CNS; No focal changes        Meds:     Medications were reviewed:  Current Facility-Administered Medications   Medication Dose Route Frequency   . atorvastatin  80 mg Oral QHS   . metoprolol succinate XL  25 mg Oral Daily   . oxymetazoline  2 spray Each Nare Once   . pantoprazole  40 mg Intravenous Q12H [facility]       Labs:     Labs (last 72 hours):    Recent Labs   Lab 12/11/17  0756 12/10/17  0415   WBC 6.31 7.33   Hgb 8.0* 7.7*   Hematocrit 26.9* 26.0*   Platelets 225 235       Recent Labs   Lab 12/09/17  1351   PT 14.5   PT INR 1.1   PTT 31    Recent Labs   Lab 12/11/17  0756 12/10/17  0415  12/07/17  0317 12/06/17  0339   Sodium 140 139  More results in Results Review 139 139   Potassium 4.3 4.4  More results in Results Review 4.7 4.9   Chloride 112* 113*  More results in Results Review 114* 115*   CO2 22 20*  More results in Results Review 20* 18*    BUN 19.0 19.0  More results in Results Review 28.0 34.0*   Creatinine 1.2 1.2  More results in Results Review 1.4* 1.6*   Calcium 7.8* 7.7*  More results in Results Review 7.8* 7.6*   Albumin  --   --   --  2.4* 2.4*   Protein, Total  --   --   --  4.8* 4.7*   Bilirubin, Total  --   --   --  0.2 0.2   Alkaline Phosphatase  --   --   --  173* 168*   ALT  --   --   --  13 13   AST (SGOT)  --   --   --  16 14   Glucose 89 93  More results in Results Review 87 102*   More results in Results Review = values in this interval not displayed.                   Microbiology, reviewed      Imaging, reviewed        Signed by: Schuyler Amor, MD  [phone]

## 2017-12-11 NOTE — Plan of Care (Signed)
Medicine Shift Note    Patient Lines/Drains/Airways Status      Active Lines, Drains and Airways       Name:   Placement date:   Placement time:   Site:   Days:    Peripheral IV 12/09/17 Right Antecubital   12/09/17    --    Antecubital   2                    Last BM: 9/22    Pending Orders: n/a    Discharge Plan: possibly 9/23    POC: pt    Skin:R elbow abrasion     Tele:yes    Activity: {1 x assist with a rolling walker    PT/OT: Home with supervision     Interpreter Needs: No    Shift Note: Patient is alert and oriented x 4. VSS. Denies Pain,  N/V, CP and SOB.  pt is on RA. Up aid lib. Moderate Nose bleed in the AM after pt ambulated to the bathroom Call light within reach, falls precaution maintained, bed in the lowest position. Will continue to monitor patient closely.

## 2017-12-11 NOTE — Progress Notes (Addendum)
Medicine Shift Note    Patient Lines/Drains/Airways Status    Active Lines, Drains and Airways     Name:   Placement date:   Placement time:   Site:   Days:    Peripheral IV 12/09/17 Right Antecubital   12/09/17    -    Antecubital   2                Last BM: 9/20    Pending Orders: n/a    Discharge Plan: possibly today     POC: patient    Skin: R elbow abrasion     Tele: Yes    Activity: ambulates with x1 assist    PT/OT:  9/16; recommending home with supervision     Interpreter Needs: No    Shift Note: Pt A&Ox4, denies pain, pt had another nose bleed this AM, resolved on own, made MD aware, CBC and BMP ordered, VSS, fall precautions in place, call bell within reach. Will continue to monitor.

## 2017-12-12 ENCOUNTER — Telehealth (INDEPENDENT_AMBULATORY_CARE_PROVIDER_SITE_OTHER): Payer: Self-pay

## 2017-12-12 DIAGNOSIS — D649 Anemia, unspecified: Secondary | ICD-10-CM

## 2017-12-12 LAB — ECG 12-LEAD
Atrial Rate: 57 {beats}/min
Atrial Rate: 59 {beats}/min
P Axis: 63 degrees
P Axis: 60 degrees
P-R Interval: 186 ms
P-R Interval: 182 ms
Q-T Interval: 478 ms
Q-T Interval: 468 ms
QRS Duration: 92 ms
QRS Duration: 94 ms
QTC Calculation (Bezet): 465 ms
QTC Calculation (Bezet): 463 ms
R Axis: -13 degrees
R Axis: -10 degrees
T Axis: 86 degrees
T Axis: 87 degrees
Ventricular Rate: 57 {beats}/min
Ventricular Rate: 59 {beats}/min

## 2017-12-12 LAB — CBC
Absolute NRBC: 0 10*3/uL (ref 0.00–0.00)
Hematocrit: 24.4 % — ABNORMAL LOW (ref 37.6–49.6)
Hgb: 7.3 g/dL — ABNORMAL LOW (ref 12.5–17.1)
MCH: 29.8 pg (ref 25.1–33.5)
MCHC: 29.9 g/dL — ABNORMAL LOW (ref 31.5–35.8)
MCV: 99.6 fL — ABNORMAL HIGH (ref 78.0–96.0)
MPV: 10.3 fL (ref 8.9–12.5)
Nucleated RBC: 0 /100 WBC (ref 0.0–0.0)
Platelets: 188 10*3/uL (ref 142–346)
RBC: 2.45 10*6/uL — ABNORMAL LOW (ref 4.20–5.90)
RDW: 15 % (ref 11–15)
WBC: 5.94 10*3/uL (ref 3.10–9.50)

## 2017-12-12 LAB — BASIC METABOLIC PANEL
BUN: 22 mg/dL (ref 9.0–28.0)
CO2: 20 mEq/L — ABNORMAL LOW (ref 22–29)
Calcium: 7.6 mg/dL — ABNORMAL LOW (ref 7.9–10.2)
Chloride: 112 mEq/L — ABNORMAL HIGH (ref 100–111)
Creatinine: 1.3 mg/dL (ref 0.7–1.3)
Glucose: 91 mg/dL (ref 70–100)
Potassium: 4.3 mEq/L (ref 3.5–5.1)
Sodium: 138 mEq/L (ref 136–145)

## 2017-12-12 LAB — TROPONIN I
Troponin I: 0.06 ng/mL (ref 0.00–0.09)
Troponin I: 0.04 ng/mL (ref 0.00–0.09)

## 2017-12-12 LAB — GFR: EGFR: 52

## 2017-12-12 MED ORDER — OCTREOTIDE ACETATE 100 MCG/ML IJ SOLN
50.00 ug | Freq: Three times a day (TID) | INTRAMUSCULAR | Status: DC
Start: 2017-12-12 — End: 2017-12-12
  Filled 2017-12-12 (×3): qty 1

## 2017-12-12 MED ORDER — IRON SUCROSE 20 MG/ML IV SOLN
200.00 mg | INTRAVENOUS | Status: DC
Start: 2017-12-12 — End: 2017-12-12
  Administered 2017-12-12: 12:00:00 200 mg via INTRAVENOUS
  Filled 2017-12-12: qty 10

## 2017-12-12 MED ORDER — METOPROLOL SUCCINATE ER 25 MG PO TB24
25.00 mg | ORAL_TABLET | Freq: Every day | ORAL | 0 refills | Status: DC
Start: 2017-12-13 — End: 2018-01-15

## 2017-12-12 MED ORDER — OCTREOTIDE ACETATE 100 MCG/ML IJ SOLN
50.00 ug | Freq: Three times a day (TID) | INTRAMUSCULAR | 0 refills | Status: DC
Start: 2017-12-12 — End: 2018-01-15

## 2017-12-12 MED ORDER — ACETAMINOPHEN 325 MG PO TABS
650.00 mg | ORAL_TABLET | ORAL | Status: DC | PRN
Start: 2017-12-12 — End: 2018-01-07

## 2017-12-12 NOTE — UM Notes (Signed)
CSR for December 12, 2017  Unit: Medicine    Vitals:  98.3, 67, 17, 133/59. 97 % RA    Labs:  hgb 7.3, hct 24.4, Cl 112, CO2 20, Ca 7.6, GFR 52  Troponin 0.06, 0.04    Current Facility-Administered Medications   Medication Dose Route Frequency   . atorvastatin  80 mg Oral QHS   . iron sucrose  200 mg Intravenous Q24H [facility]   . metoprolol succinate XL  25 mg Oral Daily   . octreotide  50 mcg Subcutaneous TID   . pantoprazole  40 mg Intravenous Q12H [facility]     Plan:  Monitor H+H  Cardiology following  Monitor hypotension- improving  GI following - s/p EGD showing bleeding angioecstasias and coagulated  Hematology following - transfuse to maintain hgb >7, will start venofer today, may need to complete course as outpatient  Nephrology following - Cr 1.3 stable, post epistaxis nasal packing    Pollyann Kennedy, RN, BSN   UR Case Manager   [phone]  Case Management Department   [facility]   [address]   [place], [place] [address]  [email or web address]

## 2017-12-12 NOTE — Telephone Encounter (Signed)
[facility] ([facility])    Received a referral to schedule a follow up appointment with the [facility].  Appointment scheduled for Tuesday 12/13/17 at 1:00PM at the [facility] location.      Clinic address is as follows:    [address] [place], [address]      Please notify patient to arrive 15 minutes early to the appointment and bring the following materials with them:    Insurance card (if insured) and photo ID  Medications in their original bottles  Glucometer/blood sugar log (if diabetic)  Weight log (if heart failure)  Proof of income (to enroll in medication assistance programs-first two pages of signed 1040 tax forms or last 2 months of pay stubs)      Terressa Koyanagi  Transitional Services   Sched Reg Rep II  T [phone]  F [phone]

## 2017-12-12 NOTE — Plan of Care (Signed)
Problem: Moderate/High Fall Risk Score >5  Goal: Patient will remain free of falls  Outcome: Progressing  Flowsheets (Taken 12/12/2017 0800)  High (Greater than 13): HIGH-Activate bed/chair exit alarm where available;HIGH-Apply yellow "Fall Risk" arm band;HIGH-Pharmacy to initiate evaluation and intervention per protocol;HIGH-Initiate use of floor mats as appropriate;HIGH-Consider use of low bed     Problem: Safety  Goal: Patient will be free from injury during hospitalization  Outcome: Progressing  Flowsheets (Taken 12/12/2017 0100 by Hollace Kinnier, RN)  Patient will be free from injury during hospitalization : Assess patient's risk for falls and implement fall prevention plan of care per policy;Provide and maintain safe environment;Use appropriate transfer methods;Ensure appropriate safety devices are available at the bedside;Include patient/ family/ care giver in decisions related to safety;Hourly rounding  Goal: Patient will be free from infection during hospitalization  Outcome: Progressing  Flowsheets (Taken 12/12/2017 0100 by Hollace Kinnier, RN)  Free from Infection during hospitalization: Assess and monitor for signs and symptoms of infection;Monitor lab/diagnostic results     Problem: Discharge Barriers  Goal: Patient will be discharged home or other facility with appropriate resources  Outcome: Progressing  Flowsheets (Taken 12/12/2017 0100 by Hollace Kinnier, RN)  Discharge to home or other facility with appropriate resources: Provide appropriate patient education;Provide information on available health resources;Initiate discharge planning     Problem: Activity Intolerance /Fatigue  Goal: Fatigue Management  Outcome: Progressing  Flowsheets (Taken 12/12/2017 0100 by Hollace Kinnier, RN)  Fatigue management : Provide assistance with self-care as needed;Energy management;Monitor and assess vital signs and oxygen saturation;Monitor hemoglobin and hematocrit;Increase activities as tolerated/progressive mobility      Problem: Altered GI Function  Goal: Fluid and electrolyte balance are achieved/maintained  Outcome: Progressing  Flowsheets (Taken 12/12/2017 0100 by Hollace Kinnier, RN)  Fluid and electrolyte balance are achieved/maintained: Monitor/assess lab values and report abnormal values;Provide adequate hydration;Assess for confusion/personality changes;Assess and reassess fluid and electrolyte status  Goal: Elimination patterns are normal or improving  Outcome: Progressing  Flowsheets (Taken 12/09/2017 2042 by Nancee Liter, RN)  Elimination patterns are normal or improving: Report abnormal assessment to physician;Anticipate/assist with toileting needs;Monitor for abdominal distension;Monitor for abdominal discomfort  Goal: No bleeding  Outcome: Progressing  Flowsheets (Taken 12/12/2017 0100 by Hollace Kinnier, RN)  No bleeding : Monitor and assess vitals and hemodynamic parameters;Monitor/assess lab values and report abnormal values;Assess for bruising/petechia     Problem: Compromised Hemodynamic Status  Goal: Vital signs and fluid balance maintained/improved  Outcome: Progressing  Flowsheets (Taken 12/12/2017 0100 by Hollace Kinnier, RN)  Vital signs and fluid balance are maintained/improved: Monitor/assess lab values and report abnormal values;Position patient for maximum circulation/cardiac output     Problem: Compromised Tissue integrity  Goal: Damaged tissue is healing and protected  Outcome: Progressing  Flowsheets (Taken 12/12/2017 0100 by Hollace Kinnier, RN)  Damaged tissue is healing and protected : Increase activity as tolerated/progressive mobility;Monitor/assess Braden scale every shift;Relieve pressure to bony prominences for patients at moderate and high risk;Avoid shearing injuries;Keep intact skin clean and dry  Goal: Nutritional status is improving  Outcome: Progressing  Flowsheets (Taken 12/12/2017 0100 by Hollace Kinnier, RN)  Nutritional status is improving: Allow adequate time for meals;Include patient/patient care  companion in decisions related to nutrition

## 2017-12-12 NOTE — Progress Notes (Signed)
MEDICINE PROGRESS NOTE    Date Time: 12/12/17 1:49 PM  Patient Name: Sass,Jarett E  Attending Physician: Herbert Moors, MD    Assessment:   GI bleed, possible upper GI. H/o angiectasia stomach, duodenum and colon. S/p Colonoscopy and EGD.  Few non bleeding angiectasia seen in colon. Largee number of Ectasia in Stomach with few showing active plasma oozing..  Dizziness, improving.  Paroxysmal A.fib, not on AC. GI bleed  CAD, S/p stent.Plavix on hold.  H/o angiectasis  Hypotension  H/o Hypertension  Anemia, acute on chronic, blood loss, further drop in HCT.  Macrocytosis  Hyperlipidemia  H/o SLE  Dizziness  Renal failure, acute on chronic, GI bleed  Metabolic acidosis  Hyperkalemia, Kayexalate.  Epistaxis,  Recurrent Epistaxis seen By ENT.Persistent bleeding.  SOB, Iv lasix given.    Plan:   GI bleed: Evaluated by GI. Patient was on Plavix. On hold. EGD showed bleeding ectasia amd coagulated..  Anemia: Two units of blood given. Monitor. Transfuse as needed. Drpo n HCT. Discussed with GI. Octretide ordered..  Hypotension: Improving. Monitor.  AKI: Improving  DVT prophylaxis  Cardiology notes noted regarding Plavix therapy.  HHT: Hematology consult,discussed with him. Rare presentation at this age. Possible therapy as out patient. Iron and Vitamin B 12 replacement as out patient.  Possible discharge on 12/12/2017    Case discussed with: Patient,staff and consultant.    Safety Checklist:     DVT prophylaxis:  CHEST guideline (See page e199S) Mechanical   Foley:  [place] Rn Foley protocol Not present   IVs:  Peripheral IV   PT/OT: Ordered   Daily CBC & or Chem ordered:  SHM/ABIM guidelines (see #5) Yes, due to clinical and lab instability   Reference for approximate charges of common labs: CBC auto diff - $76  BMP - $99  Mg - $79    Lines:     Patient Lines/Drains/Airways Status    Active PICC Line / CVC Line / PIV Line / Drain / Airway / Intraosseous Line / Epidural Line / ART Line / Line / Wound / Pressure Ulcer /  NG/OG Tube     Name:   Placement date:   Placement time:   Site:   Days:    Peripheral IV 12/03/17 Right Antecubital  12/03/17    1508    Antecubital    1                 Disposition: (Please see PAF column for Expected D/C Date)   Today's date: 12/12/2017  Admit Date: 12/03/2017  2:04 PM  LOS: 7  Clinical Milestones: Stable Hemoglobin, Push Enteroscopy  Anticipated discharge needs: Home health care      Subjective     CC: No fever    Interval History/24 hour events: Patient had epistaxis yesterday. Packing was done By ENT. Persistent oozing.    HPI/Subjective: Please see H&P     Review of Systems:     No sob  No nausea  No palpitation  No cough    Physical Exam:     VITAL SIGNS PHYSICAL EXAM   Temp:  [98.1 F (36.7 C)-98.5 F (36.9 C)] 98.1 F (36.7 C)  Heart Rate:  [55-67] 67  Resp Rate:  [16-18] 17  BP: (99-133)/(55-64) 119/64  Blood Glucose:93    Telemetry: A.fib      Intake/Output Summary (Last 24 hours) at 12/12/2017 1349  Last data filed at 12/12/2017 0300  Gross per 24 hour   Intake -   Output  300 ml   Net -300 ml    Physical Exam  General: awake, alert X oriented  Cardiovascular: regular rate and rhythm, no murmurs, rubs or gallops  Lungs: clear to auscultation bilaterally, without wheezing, rhonchi, or rales  Abdomen: soft, non-tender, non-distended; no palpable masses,  normoactive bowel sounds  Extremities: no edema  CNS; No focal changes        Meds:     Medications were reviewed:  Current Facility-Administered Medications   Medication Dose Route Frequency   . atorvastatin  80 mg Oral QHS   . iron sucrose  200 mg Intravenous Q24H [facility]   . metoprolol succinate XL  25 mg Oral Daily   . octreotide  50 mcg Subcutaneous TID   . pantoprazole  40 mg Intravenous Q12H [facility]       Labs:     Labs (last 72 hours):    Recent Labs   Lab 12/12/17  0234 12/11/17  0756   WBC 5.94 6.31   Hgb 7.3* 8.0*   Hematocrit 24.4* 26.9*   Platelets 188 225       Recent Labs   Lab 12/09/17  1351   PT 14.5   PT INR 1.1   PTT 31     Recent Labs   Lab 12/12/17  0234 12/11/17  0756  12/07/17  0317 12/06/17  0339   Sodium 138 140  More results in Results Review 139 139   Potassium 4.3 4.3  More results in Results Review 4.7 4.9   Chloride 112* 112*  More results in Results Review 114* 115*   CO2 20* 22  More results in Results Review 20* 18*   BUN 22.0 19.0  More results in Results Review 28.0 34.0*   Creatinine 1.3 1.2  More results in Results Review 1.4* 1.6*   Calcium 7.6* 7.8*  More results in Results Review 7.8* 7.6*   Albumin  --   --   --  2.4* 2.4*   Protein, Total  --   --   --  4.8* 4.7*   Bilirubin, Total  --   --   --  0.2 0.2   Alkaline Phosphatase  --   --   --  173* 168*   ALT  --   --   --  13 13   AST (SGOT)  --   --   --  16 14   Glucose 91 89  More results in Results Review 87 102*   More results in Results Review = values in this interval not displayed.                   Microbiology, reviewed      Imaging, reviewed        Signed by: Herbert Moors, MD

## 2017-12-12 NOTE — Progress Notes (Signed)
HEMATOLOGY    PROGRESS NOTE     Date Time: 12/12/17 10:05 AM  Patient Name: [name],[name]  Requesting Physician: Herbert Moors, MD    bleed    Assessment:   Multiple mucosal angioectasias throughout GI and upper respiratory tract.  Recurrent GI bleeding - resulting in anemia, Hb 7.3 g/dL today  Epistaxis   Iron deficiency (he was transfused before levels could be drawn on this admission)    Recommendations:   Agree with transfusion strategy to maintain stable Hb > 7 g/dL.  Will start Venofer today.  May need to complete course as outpatient.  May be a candidate for bevacizumab (Avastin) - would be administered as an outpatient.    Will follow.    History:   [name] is a 84 y.o. male with a history of recurrent GI bleeding, found to have AVMs by prior ECD one year ago who presents to the hospital on 12/03/2017 with melena.  Also, now with nose bleed.  He is receiving his 4th unit of pRBCs.  Hb this am 8.9 g/dL. Previously shown to be iron and B12 deficient.   B12 now replete.      Past Medical History:     Past Medical History:   Diagnosis Date   . Anemia     iron low   . Black tarry stools 2019   . CKD (chronic kidney disease), stage III    . Coronary artery disease    . DOE (dyspnea on exertion)     mild   . Encounter for blood transfusion    . Glaucoma NEC    . Hyperlipidemia    . Hypertensive disorder    . Macrocytosis    . Paroxysmal A-fib    . Skin cancer     2006 removed mole   . SLE (systemic lupus erythematosus) 1990   . Ulcerative colitis, chronic    . Vision abnormalities        Past Surgical History:     Past Surgical History:   Procedure Laterality Date   . CARDIAC SURGERY      3 stents   . EGD, COLONOSCOPY  04/03/2012    Procedure: EGD, COLONOSCOPY;  Surgeon: Lestine Mount, MD;  Location: [id number];  Service: Gastroenterology;  Laterality: N/A;   . EGD, COLONOSCOPY N/A 02/11/2017    Procedure: EGD, COLONOSCOPY;  Surgeon: Lestine Mount, MD;  Location: [facility];  Service:  Gastroenterology;  Laterality: N/A;   . EGD, COLONOSCOPY N/A 12/08/2017    Procedure: EGD, COLONOSCOPY;  Surgeon: Jacqulyn Cane, MD;  Location: [id number];  Service: Gastroenterology;  Laterality: N/A;   . EYE SURGERY      cataract       Family History:   History reviewed. No pertinent family history.    Social History:     Social History     Socioeconomic History   . Marital status: Married     Spouse name: Not on file   . Number of children: Not on file   . Years of education: Not on file   . Highest education level: Not on file   Occupational History   . Not on file   Social Needs   . Financial resource strain: Not on file   . Food insecurity:     Worry: Not on file     Inability: Not on file   . Transportation needs:     Medical: Not on file     Non-medical: Not  on file   Tobacco Use   . Smoking status: Former Smoker     Packs/day: 1.00     Years: 12.00     Pack years: 12.00     Types: Cigarettes     Last attempt to quit: 12/18/1961     Years since quitting: 56.0   . Smokeless tobacco: Never Used   Substance and Sexual Activity   . Alcohol use: Yes     Alcohol/week: 0.0 standard drinks     Comment: 0.5oz/d   . Drug use: No   . Sexual activity: Not on file   Lifestyle   . Physical activity:     Days per week: Not on file     Minutes per session: Not on file   . Stress: Not on file   Relationships   . Social connections:     Talks on phone: Not on file     Gets together: Not on file     Attends religious service: Not on file     Active member of club or organization: Not on file     Attends meetings of clubs or organizations: Not on file     Relationship status: Not on file   . Intimate partner violence:     Fear of current or ex partner: Not on file     Emotionally abused: Not on file     Physically abused: Not on file     Forced sexual activity: Not on file   Other Topics Concern   . Not on file   Social History Narrative   . Not on file       Allergies:   No Known Allergies    Medications:     Current  Facility-Administered Medications   Medication Dose Route Frequency   . atorvastatin  80 mg Oral QHS   . metoprolol succinate XL  25 mg Oral Daily   . oxymetazoline  2 spray Each Nare Once   . pantoprazole  40 mg Intravenous Q12H [facility]       Review of Systems:   \    General  no fever, chills, night sweats, generalized weakness or fatigue.   Heme: No bleeding, bruising, rash, or swelling in the neck, underarms or groin.  No pain or swelling in upper or lower extremities.  All other systems were reviewed and were negative except as indicated in the HPI.      Physical Exam:   Temp (24hrs), Avg:98.2 F (36.8 C), Min:97.7 F (36.5 C), Max:98.5 F (36.9 C)    Vitals:    12/12/17 0937   BP: 119/57   Pulse: 67   Resp:    Temp:    SpO2:        General appearance -alert/oriented and in no distress   Eyes - sclerae anicteric, slight conjunctival pallor   Neck - supple,without adenopathy   Chest - clear to auscultation  Heart - normal rate, regular rhythm, without murmur   Abdomen - soft, nontender, nondistended, no masses or organomegaly   Extremities - no pedal edema, no clubbing or cyanosis  Skin - without ecchymoses, petechiae or rash  Neurologic- without localizing deficit      Labs Reviewed:     Results     Procedure Component Value Units Date/Time    Troponin I [[id number]] Collected:  12/12/17 0512    Specimen:  Blood Updated:  12/12/17 0600     Troponin I 0.04 ng/mL     Troponin I [  [id number]] Collected:  12/12/17 0234    Specimen:  Blood Updated:  12/12/17 0322     Troponin I 0.06 ng/mL     GFR [[id number]] Collected:  12/12/17 0234     Updated:  12/12/17 0313     EGFR 52.0    Basic Metabolic Panel [[id number]]  (Abnormal) Collected:  12/12/17 0234    Specimen:  Blood Updated:  12/12/17 0313     Glucose 91 mg/dL      BUN 08.6 mg/dL      Creatinine 1.3 mg/dL      Calcium 7.6 mg/dL      Sodium 578 mEq/L      Potassium 4.3 mEq/L      Chloride 112 mEq/L      CO2 20 mEq/L     CBC without differential [[id number]]  (Abnormal)  Collected:  12/12/17 0234    Specimen:  Blood Updated:  12/12/17 0303     WBC 5.94 x10 3/uL      Hgb 7.3 g/dL      Hematocrit 41.3 %      Platelets 188 x10 3/uL      RBC 2.45 x10 6/uL      MCV 99.6 fL      MCH 29.8 pg      MCHC 29.9 g/dL      RDW 15 %      MPV 10.3 fL      Nucleated RBC 0.0 /100 WBC      Absolute NRBC 0.00 x10 3/uL           Rads:     Radiology Results (24 Hour)     ** No results found for the last 24 hours. **          Signed by:     Kimberlee Nearing Carney Bern, MD  HEMATOLOGY

## 2017-12-12 NOTE — Progress Notes (Signed)
Patient is A&Ox4, vss, no other complaints. IV line and telemetry box removed. ENT cleared patient for discharge as well. Discharge instructions explained to patient and his daughter, both verbalized understanding. Patient's daughter signed the discharge documents. Patient and daughter also refused to purchase Octreotide as the patient's insurance denied coverage for it. 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 Stressed out the importance of Octreotide to the patient and daughter and to comply with all the follow up appointments post discharge. Patient is discharged home with daughter and wheelchair escort.

## 2017-12-12 NOTE — Discharge Instr - AVS First Page (Signed)
Reason for your Hospital Admission:  Admitted with Anemia      Instructions for after your discharge:  Follow with PCP, Cardiology, renal,Hematology and ENT as advised

## 2017-12-12 NOTE — Progress Notes (Signed)
Medicine Shift Note    Patient Lines/Drains/Airways Status    Active Lines, Drains and Airways     Name:   Placement date:   Placement time:   Site:   Days:    Peripheral IV 12/09/17 Right Antecubital   12/09/17    -    Antecubital   3                Last BM: 9/22    Pending Orders: None    Discharge Plan: Home once cleared    POC: Patient    Skin: Intact    Tele: Yes: Brady, low calcium    Activity: 1 person assist OOB    PT/OT: 9/16    Interpreter Needs: No    Shift Note: AOx4, VSS. No complaint of pain or discomfort, able to sleep most of shift. No epistaxis since nose packing by ENT. High fall precautions, call bell within reach.

## 2017-12-12 NOTE — Progress Notes (Signed)
Tele alerted RN that at 0153 pt had 31 beats of AIVR. MD contacted and ordered EKG, troponins X2, CBC, and BMP.

## 2017-12-12 NOTE — Discharge Summary (Signed)
MEDICINE DISCHARGE SUMMARY    Date Time: 12/12/17 11:06 PM  Patient Name: [name],[name]  Attending Physician: Lyndell Gillyard,MD  Primary Care Physician: Philipp Ovens, MD    Date of Admission: 12/03/2017  Date of Discharge: 12/12/2017    Discharge Diagnoses:   GI bleed, possible upper GI. H/o angiectasia stomach, duodenum and colon. S/p Colonoscopy and EGD.  Few non bleeding angiectasia seen in colon. Largee number of Ectasia in Stomach with few showing active plasma oozing..  Dizziness, improving.  Paroxysmal A.fib, not on AC. GI bleed  CAD, S/p stent.Plavix on hold.  H/o angiectasis  Hypotension  H/o Hypertension  Anemia, acute on chronic, blood loss, further drop in HCT.  Macrocytosis  Hyperlipidemia  H/o SLE  Dizziness  Renal failure, acute on chronic, GI bleed  Metabolic acidosis  Hyperkalemia, improved.  Epistaxis,  Recurrent Epistaxis seen By ENT.Persistent bleeding.  SOB,improved    Disposition:      Home with family    Pending Results, Recommendations & Instructions to providers after discharge:     1. Micro / Labs / Path pending:   Unresulted Labs     None        2. Wound Care Instructions: Advisd  3. Date of completion for antibiotics or other medications: None  4. Other: Follow with PCP, ENT, Hematology, Renal as advised    Recent Labs - Last 2:       Recent Labs   Lab 12/12/17  0234 12/11/17  0756   WBC 5.94 6.31   Hgb 7.3* 8.0*   Hematocrit 24.4* 26.9*   Platelets 188 225       Recent Labs   Lab 12/09/17  1351   PT 14.5   PT INR 1.1   PTT 31     Recent Labs   Lab 12/07/17  0317 12/06/17  0339   Alkaline Phosphatase 173* 168*   Bilirubin, Total 0.2 0.2   Protein, Total 4.8* 4.7*   Albumin 2.4* 2.4*   ALT 13 13   AST (SGOT) 16 14      Recent Labs   Lab 12/12/17  0234 12/11/17  0756   Sodium 138 140   Potassium 4.3 4.3   Chloride 112* 112*   CO2 20* 22   BUN 22.0 19.0   Glucose 91 89   Calcium 7.6* 7.8*                 Invalid input(s): FREET4         Procedures/Radiology performed:   Radiology: all  results from this admission  Xr Chest Ap Portable    Result Date: 12/10/2017   Vascular congestion, left greater than right basilar atelectasis and left effusion. Stable cardiac enlargement. Prince Solian, MD 12/10/2017 1:08 PM    Xr Chest  Ap Portable    Result Date: 12/03/2017   No visible acute cardiopulmonary abnormality. Mild cardiomegaly. Adela Glimpse, MD 12/03/2017 2:35 PM       Hospital Course:     Reason for admission/ HPI: Anemia     Hospital Course: Patient was admitted with GI bleed. Vitals monitored. @ units of blood transfused. Plavix was on hold. GI consult obtained. EGD and colonoscopy done. Colonoscopy showed teleangiectasia in the colon, no bleeding. EGD showed large number of  Teleangiectasia with evidence of plasma oozing. Cauterized. Cardiology and Hematology and Renal consults obtained. Iron infused. Patient had recurrent epistaxis and ENT evaluated the patient and nasal packing done. Continued with his medicines. Diet advanced.  Discharged to home on 12/12/17. Advised to follow with GI,Hematology, ENT and urology for the management of HHT. Tylenol for pain. Sandostatin for HHT. Metoprolol for Cardiac condition.      Discharge Day Exam:  Temp:  [98.1 F (36.7 C)-98.3 F (36.8 C)] 98.1 F (36.7 C)  Heart Rate:  [60-67] 67  Resp Rate:  [16-18] 16  BP: (116-133)/(52-64) 131/52    Wounds/decutibus ulcers/stage:    Consultations:     Treatment Team:   Consulting Physician: Lestine Mount, MD    Discharge Condition:     Stable    Discharge Instructions & Follow Up Plan for Patient:     Diet:Cardiac    Activity/Weight Bearing Status:As tolerated      Patient was instructed to follow up with:     Follow-up Information     Willaim Sheng, MD. Schedule an appointment as soon as possible for a visit in 1 week(s).    Specialties:  Interventional Cardiology, Cardiology  Contact information:  [address]  [address]  [place] [address]  [phone]             Philipp Ovens, MD. Call in 1 week(s).     Specialties:  Internal Medicine, Nephrology  Contact information:  [name]  [address]  [place] [address]  [phone]             Tivis Ringer, MD. Call in 1 week(s).    Specialties:  Hematology, Medical Oncology, Internal Medicine  Contact information:  [address]  [place] [address]  [phone]             Schuyler Amor, MD. Call in 2 week(s).    Specialty:  Internal Medicine  Contact information:  [address]  [place] [address]  [phone]             Jaquelyn Bitter, MD. Call in 4 day(s).    Specialty:  Otorhinolaryngology  Contact information:  [address]  [address]  [place] [address]  [phone]                     Discharge Code Status:Full  Patient Emergency Contact:Self    Complete instructions and follow up are in the patient's After Visit Summary    Minutes spent coordinating discharge and reviewing discharge plan:60 minutes    Discharge Medications:        Discharge Medication List      Taking    acetaminophen 325 MG tablet  Dose:  650 mg  Commonly known as:  TYLENOL  Take 2 tablets (650 mg total) by mouth every 4 (four) hours as needed for Pain or Fever (Temperature greater than 38 C/100.4 F)     atorvastatin 80 MG tablet  Dose:  80 mg  Commonly known as:  LIPITOR  Take 1 tablet (80 mg total) by mouth nightly.     furosemide 40 MG tablet  Dose:  40 mg  Commonly known as:  LASIX  Take 40 mg by mouth daily     IRON PO  Dose:  200 mg  Take 200 mg by mouth daily.     lisinopril 5 MG tablet  Dose:  5 mg  Commonly known as:  PRINIVIL,ZESTRIL  Take 5 mg by mouth daily     metoprolol succinate XL 25 MG 24 hr tablet  Dose:  25 mg  Commonly known as:  TOPROL-XL  Start taking on:  December 13, 2017  Take 1 tablet (25 mg total) by mouth daily     octreotide 100 MCG/ML Soln  Dose:  50 mcg  Commonly known as:  SandoSTATIN  Inject 0.5 mLs (50 mcg total) into the skin 3 (three) times daily     PROTONIX 40 MG tablet  Dose:  40 mg  Generic drug:  pantoprazole  Take 40 mg by mouth 2 (two)  times daily        STOP taking these medications    clopidogrel 75 mg tablet  Commonly known as:  PLAVIX                  Immunizations provided:           Signed by: Herbert Moors, MD    CC: ParkTrish Fountain, MD

## 2017-12-12 NOTE — Plan of Care (Signed)
Problem: Moderate/High Fall Risk Score >5  Goal: Patient will remain free of falls  Outcome: Progressing  Flowsheets (Taken 12/12/2017 0000)  High (Greater than 13): HIGH-Initiate use of floor mats as appropriate;HIGH-Apply yellow "Fall Risk" arm band;HIGH-Activate bed/chair exit alarm where available     Problem: Safety  Goal: Patient will be free from injury during hospitalization  Outcome: Progressing  Flowsheets (Taken 12/12/2017 0100)  Patient will be free from injury during hospitalization : Assess patient's risk for falls and implement fall prevention plan of care per policy; Provide and maintain safe environment; Use appropriate transfer methods; Ensure appropriate safety devices are available at the bedside; Include patient/ family/ care giver in decisions related to safety; Hourly rounding  Goal: Patient will be free from infection during hospitalization  Outcome: Progressing  Flowsheets (Taken 12/12/2017 0100)  Free from Infection during hospitalization: Assess and monitor for signs and symptoms of infection; Monitor lab/diagnostic results     Problem: Discharge Barriers  Goal: Patient will be discharged home or other facility with appropriate resources  Outcome: Progressing  Flowsheets (Taken 12/12/2017 0100)  Discharge to home or other facility with appropriate resources: Provide appropriate patient education; Provide information on available health resources; Initiate discharge planning     Problem: Activity Intolerance /Fatigue  Goal: Fatigue Management  Outcome: Progressing  Flowsheets (Taken 12/12/2017 0100)  Fatigue management : Provide assistance with self-care as needed; Energy management; Monitor and assess vital signs and oxygen saturation; Monitor hemoglobin and hematocrit; Increase activities as tolerated/progressive mobility     Problem: Altered GI Function  Goal: Fluid and electrolyte balance are achieved/maintained  Outcome: Progressing  Flowsheets (Taken 12/12/2017 0100)  Fluid and  electrolyte balance are achieved/maintained: Monitor/assess lab values and report abnormal values; Provide adequate hydration; Assess for confusion/personality changes; Assess and reassess fluid and electrolyte status  Goal: Elimination patterns are normal or improving  Outcome: Progressing  Flowsheets (Taken 12/09/2017 2042 by Nancee Liter, RN)  Elimination patterns are normal or improving: Report abnormal assessment to physician;Anticipate/assist with toileting needs;Monitor for abdominal distension;Monitor for abdominal discomfort  Goal: No bleeding  Outcome: Progressing  Flowsheets (Taken 12/12/2017 0100)  No bleeding : Monitor and assess vitals and hemodynamic parameters; Monitor/assess lab values and report abnormal values; Assess for bruising/petechia     Problem: Compromised Hemodynamic Status  Goal: Vital signs and fluid balance maintained/improved  Outcome: Progressing  Flowsheets (Taken 12/12/2017 0100)  Vital signs and fluid balance are maintained/improved: Monitor/assess lab values and report abnormal values; Position patient for maximum circulation/cardiac output     Problem: Compromised Tissue integrity  Goal: Damaged tissue is healing and protected  Outcome: Progressing  Flowsheets (Taken 12/12/2017 0100)  Damaged tissue is healing and protected : Increase activity as tolerated/progressive mobility; Monitor/assess Braden scale every shift; Relieve pressure to bony prominences for patients at moderate and high risk; Avoid shearing injuries; Keep intact skin clean and dry  Goal: Nutritional status is improving  Outcome: Progressing  Flowsheets (Taken 12/12/2017 0100)  Nutritional status is improving: Allow adequate time for meals; Include patient/patient care companion in decisions related to nutrition

## 2017-12-12 NOTE — Progress Notes (Signed)
CASE MANAGEMENT PROGRESS NOTE      Patient: [name]. Strupp  Male, 84 y.o., 1927/11/28  MRN:   [id number]  Bed:   [facility] [address]-F936.01      Admission Date:  12/03/2017  2:04 PM  Hospital Day     Active Hospital Problem List  Active Hospital Problems    Diagnosis   . GI bleed   . Anemia     Length of stay: Hospital Day   Patient  Informed writer he will be discharged to home today around 4 pm and he has made arrangements for his daughter to transport him home. Case manager referral to TCM.      Consuello Masse, RN, [facility]-   Case Management Department  [facility]  [address]  [place], [place] [address]  [phone]  12/12/2017   3:04 PM

## 2017-12-12 NOTE — Progress Notes (Signed)
PROGRESS NOTE    Date Time: 12/12/17 9:40 AM  Patient Name: [name],[name]  Requesting Physician: Herbert Moors, MD        Assessment:   CKD 3  GI bleed  PAF   Hypotension   HLD  HX SLE  Hyperkalemia   AG MA with hypoalbuminemia   Plan:   Creatinine 1.3 stable   Post epistaxis nasal packing   Low h/h monitor   Case d/w patient and staff care coordinated   History:   [name] is a 84 y.o. male who presents to the hospital on 12/03/2017 with black tarry stools hx CKD 3 we are asked for management   No dysuria . Hematuria , frequency   No abdominal pain     Past Medical History:     Past Medical History:   Diagnosis Date   . Anemia     iron low   . Black tarry stools 2019   . CKD (chronic kidney disease), stage III    . Coronary artery disease    . DOE (dyspnea on exertion)     mild   . Encounter for blood transfusion    . Glaucoma NEC    . Hyperlipidemia    . Hypertensive disorder    . Macrocytosis    . Paroxysmal A-fib    . Skin cancer     2006 removed mole   . SLE (systemic lupus erythematosus) 1990   . Ulcerative colitis, chronic    . Vision abnormalities        Past Surgical History:     Past Surgical History:   Procedure Laterality Date   . CARDIAC SURGERY      3 stents   . EGD, COLONOSCOPY  04/03/2012    Procedure: EGD, COLONOSCOPY;  Surgeon: Lestine Mount, MD;  Location: [facility];  Service: Gastroenterology;  Laterality: N/A;   . EGD, COLONOSCOPY N/A 02/11/2017    Procedure: EGD, COLONOSCOPY;  Surgeon: Lestine Mount, MD;  Location: [facility];  Service: Gastroenterology;  Laterality: N/A;   . EGD, COLONOSCOPY N/A 12/08/2017    Procedure: EGD, COLONOSCOPY;  Surgeon: Jacqulyn Cane, MD;  Location: [facility];  Service: Gastroenterology;  Laterality: N/A;   . EYE SURGERY      cataract       Family History:   History reviewed. No pertinent family history.    Social History:     Social History     Socioeconomic History   . Marital status: Married     Spouse name: Not on file   . Number of  children: Not on file   . Years of education: Not on file   . Highest education level: Not on file   Occupational History   . Not on file   Social Needs   . Financial resource strain: Not on file   . Food insecurity:     Worry: Not on file     Inability: Not on file   . Transportation needs:     Medical: Not on file     Non-medical: Not on file   Tobacco Use   . Smoking status: Former Smoker     Packs/day: 1.00     Years: 12.00     Pack years: 12.00     Types: Cigarettes     Last attempt to quit: 12/18/1961     Years since quitting: 56.0   . Smokeless tobacco: Never Used   Substance and Sexual Activity   .  Alcohol use: Yes     Alcohol/week: 0.0 standard drinks     Comment: 0.5oz/d   . Drug use: No   . Sexual activity: Not on file   Lifestyle   . Physical activity:     Days per week: Not on file     Minutes per session: Not on file   . Stress: Not on file   Relationships   . Social connections:     Talks on phone: Not on file     Gets together: Not on file     Attends religious service: Not on file     Active member of club or organization: Not on file     Attends meetings of clubs or organizations: Not on file     Relationship status: Not on file   . Intimate partner violence:     Fear of current or ex partner: Not on file     Emotionally abused: Not on file     Physically abused: Not on file     Forced sexual activity: Not on file   Other Topics Concern   . Not on file   Social History Narrative   . Not on file       Allergies:   No Known Allergies    Medications:     Current Facility-Administered Medications   Medication Dose Route Frequency   . atorvastatin  80 mg Oral QHS   . metoprolol succinate XL  25 mg Oral Daily   . oxymetazoline  2 spray Each Nare Once   . pantoprazole  40 mg Intravenous Q12H SCH       Review of Systems:   A comprehensive review of systems was: per HPI     Physical Exam:     Vitals:    12/12/17 0937   BP: 119/57   Pulse: (!) 57   Resp:    Temp:    SpO2:        Intake and Output Summary  (Last 24 hours) at Date Time    Intake/Output Summary (Last 24 hours) at 12/12/2017 0940  Last data filed at 12/12/2017 0300  Gross per 24 hour   Intake -   Output 300 ml   Net -300 ml     General appearance - alert, well appearing, and in no distress  Chest - clear to auscultation, no wheezes, rales or rhonchi, symmetric air entry  Heart - normal rate, regular rhythm, normal S1, S2, no murmurs, rubs, clicks or gallops  Abdomen - soft, nontender, nondistended, no masses or organomegaly  Neurological - alert, oriented, normal speech, no focal findings or movement disorder noted  Musculoskeletal - no joint tenderness, deformity or swelling  Extremities - peripheral pulses normal, no pedal edema, no clubbing or cyanosis      Labs Reviewed:     Results     Procedure Component Value Units Date/Time    Troponin I [[id number]] Collected:  12/12/17 0512    Specimen:  Blood Updated:  12/12/17 0600     Troponin I 0.04 ng/mL     Troponin I [[id number]] Collected:  12/12/17 0234    Specimen:  Blood Updated:  12/12/17 0322     Troponin I 0.06 ng/mL     GFR [[id number]] Collected:  12/12/17 0234     Updated:  12/12/17 0313     EGFR 52.0    Basic Metabolic Panel [[id number]]  (Abnormal) Collected:  12/12/17 0234    Specimen:  Blood Updated:  12/12/17  0313     Glucose 91 mg/dL      BUN 60.4 mg/dL      Creatinine 1.3 mg/dL      Calcium 7.6 mg/dL      Sodium 540 mEq/L      Potassium 4.3 mEq/L      Chloride 112 mEq/L      CO2 20 mEq/L     CBC without differential [[id number]]  (Abnormal) Collected:  12/12/17 0234    Specimen:  Blood Updated:  12/12/17 0303     WBC 5.94 x10 3/uL      Hgb 7.3 g/dL      Hematocrit 29.5 %      Platelets 188 x10 3/uL      RBC 2.45 x10 6/uL      MCV 99.6 fL      MCH 29.8 pg      MCHC 29.9 g/dL      RDW 15 %      MPV 10.3 fL      Nucleated RBC 0.0 /100 WBC      Absolute NRBC 0.00 x10 3/uL           Rads:   Radiological Procedure reviewed.     Signed by: Schuyler Amor MD  [phone]

## 2017-12-13 ENCOUNTER — Ambulatory Visit (INDEPENDENT_AMBULATORY_CARE_PROVIDER_SITE_OTHER): Payer: 59 | Admitting: Hospitalist

## 2017-12-13 ENCOUNTER — Telehealth: Payer: Self-pay

## 2017-12-13 NOTE — Telephone Encounter (Signed)
Patient's son Cleone Slim) left a voicemail to schedule hospital follow up with Dr. Carney Bern.    Spoke to patient's son and offered to schedule patinet with Rosalva Ferron, NP but he only want to see Dr. Carney Bern. He said November appointment is ok with him. Scheduled patient on 11/12 12:40 pm (arrival 12:10 pm). He is aware of the appointment time and location.

## 2018-01-07 ENCOUNTER — Emergency Department: Payer: Medicare Other

## 2018-01-07 ENCOUNTER — Inpatient Hospital Stay
Admission: EM | Admit: 2018-01-07 | Discharge: 2018-01-15 | DRG: 987 | Disposition: A | Discharge status: Home / Self Care | Payer: Medicare Other | Attending: Internal Medicine | Admitting: Internal Medicine

## 2018-01-07 DIAGNOSIS — I5022 Chronic systolic (congestive) heart failure: Secondary | ICD-10-CM | POA: Diagnosis present

## 2018-01-07 DIAGNOSIS — C61 Malignant neoplasm of prostate: Secondary | ICD-10-CM | POA: Diagnosis present

## 2018-01-07 DIAGNOSIS — R918 Other nonspecific abnormal finding of lung field: Secondary | ICD-10-CM | POA: Diagnosis present

## 2018-01-07 DIAGNOSIS — I255 Ischemic cardiomyopathy: Secondary | ICD-10-CM | POA: Diagnosis present

## 2018-01-07 DIAGNOSIS — Z87891 Personal history of nicotine dependence: Secondary | ICD-10-CM

## 2018-01-07 DIAGNOSIS — K31811 Angiodysplasia of stomach and duodenum with bleeding: Principal | ICD-10-CM | POA: Diagnosis present

## 2018-01-07 DIAGNOSIS — N183 Chronic kidney disease, stage 3 (moderate): Secondary | ICD-10-CM | POA: Diagnosis present

## 2018-01-07 DIAGNOSIS — D500 Iron deficiency anemia secondary to blood loss (chronic): Secondary | ICD-10-CM | POA: Diagnosis present

## 2018-01-07 DIAGNOSIS — R0902 Hypoxemia: Secondary | ICD-10-CM | POA: Diagnosis present

## 2018-01-07 DIAGNOSIS — I440 Atrioventricular block, first degree: Secondary | ICD-10-CM

## 2018-01-07 DIAGNOSIS — Z85828 Personal history of other malignant neoplasm of skin: Secondary | ICD-10-CM

## 2018-01-07 DIAGNOSIS — D649 Anemia, unspecified: Secondary | ICD-10-CM

## 2018-01-07 DIAGNOSIS — R9431 Abnormal electrocardiogram [ECG] [EKG]: Secondary | ICD-10-CM

## 2018-01-07 DIAGNOSIS — I130 Hypertensive heart and chronic kidney disease with heart failure and stage 1 through stage 4 chronic kidney disease, or unspecified chronic kidney disease: Secondary | ICD-10-CM | POA: Diagnosis present

## 2018-01-07 DIAGNOSIS — M329 Systemic lupus erythematosus, unspecified: Secondary | ICD-10-CM | POA: Diagnosis present

## 2018-01-07 DIAGNOSIS — E785 Hyperlipidemia, unspecified: Secondary | ICD-10-CM | POA: Diagnosis present

## 2018-01-07 DIAGNOSIS — I491 Atrial premature depolarization: Secondary | ICD-10-CM

## 2018-01-07 DIAGNOSIS — C7951 Secondary malignant neoplasm of bone: Secondary | ICD-10-CM | POA: Diagnosis present

## 2018-01-07 DIAGNOSIS — I4581 Long QT syndrome: Secondary | ICD-10-CM

## 2018-01-07 DIAGNOSIS — I272 Pulmonary hypertension, unspecified: Secondary | ICD-10-CM | POA: Diagnosis present

## 2018-01-07 DIAGNOSIS — Z79899 Other long term (current) drug therapy: Secondary | ICD-10-CM

## 2018-01-07 DIAGNOSIS — I2510 Atherosclerotic heart disease of native coronary artery without angina pectoris: Secondary | ICD-10-CM | POA: Diagnosis present

## 2018-01-07 DIAGNOSIS — I2129 ST elevation (STEMI) myocardial infarction involving other sites: Secondary | ICD-10-CM

## 2018-01-07 DIAGNOSIS — Z955 Presence of coronary angioplasty implant and graft: Secondary | ICD-10-CM

## 2018-01-07 DIAGNOSIS — I480 Paroxysmal atrial fibrillation: Secondary | ICD-10-CM | POA: Diagnosis present

## 2018-01-07 DIAGNOSIS — R579 Shock, unspecified: Secondary | ICD-10-CM | POA: Diagnosis present

## 2018-01-07 DIAGNOSIS — I340 Nonrheumatic mitral (valve) insufficiency: Secondary | ICD-10-CM | POA: Diagnosis present

## 2018-01-07 DIAGNOSIS — K922 Gastrointestinal hemorrhage, unspecified: Secondary | ICD-10-CM

## 2018-01-07 DIAGNOSIS — N179 Acute kidney failure, unspecified: Secondary | ICD-10-CM | POA: Diagnosis present

## 2018-01-07 DIAGNOSIS — D62 Acute posthemorrhagic anemia: Secondary | ICD-10-CM | POA: Diagnosis present

## 2018-01-07 DIAGNOSIS — Z7902 Long term (current) use of antithrombotics/antiplatelets: Secondary | ICD-10-CM

## 2018-01-07 DIAGNOSIS — R578 Other shock: Secondary | ICD-10-CM | POA: Diagnosis present

## 2018-01-07 LAB — CBC AND DIFFERENTIAL
Absolute NRBC: 0 10*3/uL (ref 0.00–0.00)
Absolute NRBC: 0 10*3/uL (ref 0.00–0.00)
Basophils Absolute Automated: 0.02 10*3/uL (ref 0.00–0.08)
Basophils Automated: 0.3 %
Eosinophils Absolute Automated: 0.01 10*3/uL (ref 0.00–0.44)
Eosinophils Automated: 0.1 %
Hematocrit: 17 % — ABNORMAL LOW (ref 37.6–49.6)
Hematocrit: 26.1 % — ABNORMAL LOW (ref 37.6–49.6)
Hgb: 4.7 g/dL — CL (ref 12.5–17.1)
Hgb: 8.1 g/dL — ABNORMAL LOW (ref 12.5–17.1)
Immature Granulocytes Absolute: 0.03 10*3/uL (ref 0.00–0.07)
Immature Granulocytes: 0.4 %
Lymphocytes Absolute Automated: 0.36 10*3/uL — ABNORMAL LOW (ref 0.42–3.22)
Lymphocytes Automated: 4.6 %
MCH: 30.3 pg (ref 25.1–33.5)
MCH: 29.8 pg (ref 25.1–33.5)
MCHC: 27.6 g/dL — ABNORMAL LOW (ref 31.5–35.8)
MCHC: 31 g/dL — ABNORMAL LOW (ref 31.5–35.8)
MCV: 109.7 fL — ABNORMAL HIGH (ref 78.0–96.0)
MCV: 96 fL (ref 78.0–96.0)
MPV: 10.1 fL (ref 8.9–12.5)
MPV: 9.7 fL (ref 8.9–12.5)
Monocytes Absolute Automated: 0.73 10*3/uL (ref 0.21–0.85)
Monocytes: 9.3 %
Neutrophils Absolute: 6.71 10*3/uL — ABNORMAL HIGH (ref 1.10–6.33)
Neutrophils: 85.3 %
Nucleated RBC: 0 /100 WBC (ref 0.0–0.0)
Nucleated RBC: 0 /100 WBC (ref 0.0–0.0)
Platelets: 389 10*3/uL — ABNORMAL HIGH (ref 142–346)
Platelets: 262 10*3/uL (ref 142–346)
RBC: 1.55 10*6/uL — ABNORMAL LOW (ref 4.20–5.90)
RBC: 2.72 10*6/uL — ABNORMAL LOW (ref 4.20–5.90)
RDW: 21 % — ABNORMAL HIGH (ref 11–15)
RDW: 19 % — ABNORMAL HIGH (ref 11–15)
WBC: 8.54 10*3/uL (ref 3.10–9.50)
WBC: 7.86 10*3/uL (ref 3.10–9.50)

## 2018-01-07 LAB — CBC
Absolute NRBC: 0.02 10*3/uL — ABNORMAL HIGH (ref 0.00–0.00)
Hematocrit: 22.3 % — ABNORMAL LOW (ref 37.6–49.6)
Hgb: 6.7 g/dL — ABNORMAL LOW (ref 12.5–17.1)
MCH: 29.5 pg (ref 25.1–33.5)
MCHC: 30 g/dL — ABNORMAL LOW (ref 31.5–35.8)
MCV: 98.2 fL — ABNORMAL HIGH (ref 78.0–96.0)
MPV: 10.1 fL (ref 8.9–12.5)
Nucleated RBC: 0.2 /100 WBC — ABNORMAL HIGH (ref 0.0–0.0)
Platelets: 305 10*3/uL (ref 142–346)
RBC: 2.27 10*6/uL — ABNORMAL LOW (ref 4.20–5.90)
RDW: 20 % — ABNORMAL HIGH (ref 11–15)
WBC: 9.65 10*3/uL — ABNORMAL HIGH (ref 3.10–9.50)

## 2018-01-07 LAB — BASIC METABOLIC PANEL
BUN: 68 mg/dL — ABNORMAL HIGH (ref 9.0–28.0)
CO2: 17 mEq/L — ABNORMAL LOW (ref 22–29)
Calcium: 7.4 mg/dL — ABNORMAL LOW (ref 7.9–10.2)
Chloride: 115 mEq/L — ABNORMAL HIGH (ref 100–111)
Creatinine: 1.8 mg/dL — ABNORMAL HIGH (ref 0.7–1.3)
Glucose: 90 mg/dL (ref 70–100)
Potassium: 4.2 mEq/L (ref 3.5–5.1)
Sodium: 141 mEq/L (ref 136–145)

## 2018-01-07 LAB — TYPE AND SCREEN
AB Screen Gel: NEGATIVE
ABO Rh: O POS

## 2018-01-07 LAB — MAN DIFF ONLY
Band Neutrophils Absolute: 0 10*3/uL (ref 0.00–1.00)
Band Neutrophils: 0 %
Basophils Absolute Manual: 0.09 10*3/uL — ABNORMAL HIGH (ref 0.00–0.08)
Basophils Manual: 1 %
Eosinophils Absolute Manual: 0 10*3/uL (ref 0.00–0.44)
Eosinophils Manual: 0 %
Lymphocytes Absolute Manual: 0.43 10*3/uL (ref 0.42–3.22)
Lymphocytes Manual: 5 %
Monocytes Absolute: 0.77 10*3/uL (ref 0.21–0.85)
Monocytes Manual: 9 %
Neutrophils Absolute Manual: 7.26 10*3/uL — ABNORMAL HIGH (ref 1.10–6.33)
Segmented Neutrophils: 85 %

## 2018-01-07 LAB — COMPREHENSIVE METABOLIC PANEL
ALT: 15 U/L (ref 0–55)
AST (SGOT): 12 U/L (ref 5–34)
Albumin/Globulin Ratio: 1.1 (ref 0.9–2.2)
Albumin: 2.9 g/dL — ABNORMAL LOW (ref 3.5–5.0)
Alkaline Phosphatase: 131 U/L — ABNORMAL HIGH (ref 38–106)
BUN: 75 mg/dL — ABNORMAL HIGH (ref 9.0–28.0)
Bilirubin, Total: 0.3 mg/dL (ref 0.2–1.2)
CO2: 19 mEq/L — ABNORMAL LOW (ref 22–29)
Calcium: 8.3 mg/dL (ref 7.9–10.2)
Chloride: 113 mEq/L — ABNORMAL HIGH (ref 100–111)
Creatinine: 2.1 mg/dL — ABNORMAL HIGH (ref 0.7–1.3)
Globulin: 2.6 g/dL (ref 2.0–3.6)
Glucose: 102 mg/dL — ABNORMAL HIGH (ref 70–100)
Potassium: 4.7 mEq/L (ref 3.5–5.1)
Protein, Total: 5.5 g/dL — ABNORMAL LOW (ref 6.0–8.3)
Sodium: 141 mEq/L (ref 136–145)

## 2018-01-07 LAB — PREPARE PLATELETS
Expiration Date: 201910202359
ISBT CODE: 6200
Status: TRANSFUSED
UTYPE: A POS

## 2018-01-07 LAB — RBC EMERGENCY RELEASE UNCROSSMATCHED
Expiration Date: 201911202359
Expiration Date: 201911252359
ISBT CODE: 5100
ISBT CODE: 5100
Status: TRANSFUSED
Status: TRANSFUSED
UTYPE: O POS
UTYPE: O POS

## 2018-01-07 LAB — GLUCOSE WHOLE BLOOD - POCT
Whole Blood Glucose POCT: 89 mg/dL (ref 70–100)
Whole Blood Glucose POCT: 90 mg/dL (ref 70–100)
Whole Blood Glucose POCT: 90 mg/dL (ref 70–100)
Whole Blood Glucose POCT: 93 mg/dL (ref 70–100)
Whole Blood Glucose POCT: 96 mg/dL (ref 70–100)

## 2018-01-07 LAB — PT AND APTT
PT INR: 1.2 — ABNORMAL HIGH (ref 0.9–1.1)
PT: 14.9 s (ref 12.6–15.0)
PTT: 27 s (ref 23–37)

## 2018-01-07 LAB — HEMOGLOBIN A1C
Average Estimated Glucose: 88.2 mg/dL
Hemoglobin A1C: 4.7 % (ref 4.6–5.9)

## 2018-01-07 LAB — CELL MORPHOLOGY
Cell Morphology: ABNORMAL — AB
Platelet Estimate: NORMAL

## 2018-01-07 LAB — MAGNESIUM
Magnesium: 2.5 mg/dL (ref 1.6–2.6)
Magnesium: 2.3 mg/dL (ref 1.6–2.6)

## 2018-01-07 LAB — TROPONIN I: Troponin I: 0.01 ng/mL (ref 0.00–0.05)

## 2018-01-07 LAB — GFR
EGFR: 29.9
EGFR: 35.7

## 2018-01-07 LAB — PHOSPHORUS: Phosphorus: 3.4 mg/dL (ref 2.3–4.7)

## 2018-01-07 LAB — LACTIC ACID, PLASMA: Lactic Acid: 1.2 mmol/L (ref 0.2–2.0)

## 2018-01-07 MED ORDER — SODIUM CHLORIDE 0.9 % IV SOLN
INTRAVENOUS | Status: DC | PRN
Start: 2018-01-07 — End: 2018-01-15

## 2018-01-07 MED ORDER — GLUCAGON 1 MG IJ SOLR (WRAP)
1.00 mg | INTRAMUSCULAR | Status: DC | PRN
Start: 2018-01-07 — End: 2018-01-15

## 2018-01-07 MED ORDER — SODIUM CHLORIDE 0.9 % IV BOLUS
1000.00 mL | Freq: Once | INTRAVENOUS | Status: AC
Start: 2018-01-07 — End: 2018-01-07
  Administered 2018-01-07: 11:00:00 1000 mL via INTRAVENOUS

## 2018-01-07 MED ORDER — FENTANYL CITRATE (PF) 50 MCG/ML IJ SOLN (WRAP)
25.00 ug | INTRAMUSCULAR | Status: DC | PRN
Start: 2018-01-07 — End: 2018-01-15

## 2018-01-07 MED ORDER — PANTOPRAZOLE SODIUM 40 MG IV SOLR
8.00 mg/h | INTRAVENOUS | Status: DC
Start: 2018-01-07 — End: 2018-01-08
  Administered 2018-01-07 – 2018-01-08 (×3): 8 mg/h via INTRAVENOUS
  Filled 2018-01-07 (×5): qty 80

## 2018-01-07 MED ORDER — GLUCOSE 40 % PO GEL
15.00 g | ORAL | Status: DC | PRN
Start: 2018-01-07 — End: 2018-01-15

## 2018-01-07 MED ORDER — SODIUM CHLORIDE 0.9 % IV SOLN
1.0000 g | INTRAVENOUS | Status: AC
Start: 2018-01-07 — End: 2018-01-07
  Administered 2018-01-07 (×2): 1 g via INTRAVENOUS
  Filled 2018-01-07 (×2): qty 10

## 2018-01-07 MED ORDER — OCTREOTIDE ACETATE 100 MCG/ML IJ SOLN
50.00 ug | Freq: Three times a day (TID) | INTRAMUSCULAR | Status: DC
Start: 2018-01-07 — End: 2018-01-08
  Administered 2018-01-07 – 2018-01-08 (×3): 50 ug via SUBCUTANEOUS
  Filled 2018-01-07 (×6): qty 1

## 2018-01-07 MED ORDER — SODIUM CHLORIDE 0.9 % IV BOLUS
500.00 mL | Freq: Once | INTRAVENOUS | Status: DC
Start: 2018-01-07 — End: 2018-01-07

## 2018-01-07 MED ORDER — ACETAMINOPHEN 650 MG RE SUPP
650.00 mg | Freq: Four times a day (QID) | RECTAL | Status: DC | PRN
Start: 2018-01-07 — End: 2018-01-15
  Administered 2018-01-07: 16:00:00 650 mg via RECTAL
  Filled 2018-01-07: qty 1

## 2018-01-07 MED ORDER — INSULIN LISPRO 100 UNIT/ML SC SOLN
1.00 [IU] | SUBCUTANEOUS | Status: DC
Start: 2018-01-07 — End: 2018-01-09

## 2018-01-07 MED ORDER — ONDANSETRON HCL 4 MG/2ML IJ SOLN
4.00 mg | Freq: Three times a day (TID) | INTRAMUSCULAR | Status: DC | PRN
Start: 2018-01-07 — End: 2018-01-15
  Administered 2018-01-15: 05:00:00 4 mg via INTRAVENOUS
  Filled 2018-01-07: qty 2

## 2018-01-07 MED ORDER — DEXTROSE 50 % IV SOLN
12.50 g | INTRAVENOUS | Status: DC | PRN
Start: 2018-01-07 — End: 2018-01-15

## 2018-01-07 NOTE — Progress Notes (Signed)
Pt admitted to unit from ED @ 1400, 2nd unit PRBC infusing upon arrival. 1u Platelets given upon arrival. Pt A&O x4, MAE, FC, c/o soreness to R ribs - given acetaminophen suppository. RA, no desats. Afib in 60's, SBP soft 90's. Repeat Hgb 6.7 - 1u PRBC transfusing. NPO, protonix gtt infusing. Voids in urinal. No BM. Trending CBC Q6. Will continue to monitor, support given.

## 2018-01-07 NOTE — ED Provider Notes (Signed)
[facility] EMERGENCY DEPARTMENT H&P                                             ATTENDING SUPERVISORY NOTE      Visit date: 01/07/2018    CLINICAL SUMMARY        Diagnosis:    .     Final diagnoses:   Gastrointestinal hemorrhage, unspecified gastrointestinal hemorrhage type   Anemia, unspecified type       Attending Note:      BRIEF HPI: 89yoM with hx including CAD with 2 stents, a-fib, HTN, SLE, UC and anemia with prior transfusions BIBA after a fall today. He was had numerous unwitnessed falls this week. He describes that he will get dizzy and then have a mechanical fall. No preceding CP or syncope. Today he sat up became dizzy and fell onto his R side. ?head injury. No recent medication changes. Pt's BP runs low normally. Of note, he was admitted from 9/14 to 9/23 d/t GIB and anemia.     FOCUSED PEX:  Alert, oriented. RRR, no mgr. CTA B, no resp distress. Abd soft, nontender. Grossly non-focal neuro exam. Pale. Diffuse tenderness R chest wall, no crepitus.     MDM: 18XWR with h/o recurrent GI bleeds of uncertain etiology presents hypotensive with hgb 4.7 requiring multiple units of packed red blood cells.  No suspicion infection at time of admission.  Admitted to ICU for further care.  Gastroenterology is aware.         Disposition:         Inpatient Admit      ED Disposition     ED Disposition Condition Date/Time Comment    Admit  Sat Jan 07, 2018 12:23 PM Admitting Physician: Frederik Pear [[id number]]   Diagnosis: GI bleed [098119]   Estimated Length of Stay: > or = to 2 midnights   Tentative Discharge Plan?: Home or Self Care [1]   Patient Class: Inpatient [101]                       VISIT INFORMATION        Clinical Course in the ED:      ED Course as of Jan 08 1335   Sat Jan 07, 2018   1131 GI to see    [OH]   1137 Verbally d/w pt and family transfusing  o positive PRBCs given hypotension and hgb   [OH]   1206 Resident spoke with Lurlean Nanny    [OH]      ED Course User Index  [OH]  Herder, Olivia       Critical Care Time(not including procedures): 54 minutes.   Due to the high risk of critical illness or multi-organ failure at initial presentation and/or during ED course.    System(s) at risk for compromise:  circulatory and respiratory  Critical Diagnosis:   1. Gastrointestinal hemorrhage, unspecified gastrointestinal hemorrhage type    2. Anemia, unspecified type         The patient was Hypotensive:   Yes   The patient was Hypoxic:   YES   This does not including time spent performing other reported procedures or services.   Critical care time involved full attention to the patient's condition and included:   Review of nursing notes and/or old charts - Yes  Documentation time - Yes  Care, transfer of care, and discharge plans - Yes  Obtaining necessary history from family, EMS, nursing home staff and/or treating physicians - Yes  Review of medications, allergies, and vital signs - Yes   Consultant collaboration on findings and treatment options - Yes  Ordering, interpreting, and reviewing diagnostic studies/tab tests - Yes         Medications Given in the ED:    .     ED Medication Orders (From admission, onward)    Start Ordered     Status Ordering Provider    01/07/18 1400 01/07/18 1220  pantoprazole (PROTONIX) 80 mg in sodium chloride 0.9 % 100 mL infusion  Continuous     Route: Intravenous  Ordered Dose: 8 mg/hr     Acknowledged JUAREZ, KELLY S    01/07/18 1400 01/07/18 1302  octreotide (SandoSTATIN) injection 50 mcg  3 times daily     Route: Subcutaneous  Ordered Dose: 50 mcg     Ordered JUAREZ, KELLY S    01/07/18 1309 01/07/18 1310  0.9%  NaCl infusion  As needed     Route: Intravenous     Ordered Chales Salmon, KELLY S    01/07/18 1305 01/07/18 1304  insulin lispro (HumaLOG) injection 1-5 Units  Every 4 hours scheduled     Route: Subcutaneous  Ordered Dose: 1-5 Units     Ordered JUAREZ, KELLY S    01/07/18 1303 01/07/18 1304  dextrose (GLUCOSE) 40 % oral gel 15 g of glucose  As  needed     Route: Oral  Ordered Dose: 15 g of glucose     Ordered JUAREZ, KELLY S    01/07/18 1303 01/07/18 1304  dextrose 50 % bolus 12.5 g  As needed     Route: Intravenous  Ordered Dose: 12.5 g     Ordered JUAREZ, KELLY S    01/07/18 1303 01/07/18 1304  glucagon (rDNA) (GLUCAGEN) injection 1 mg  As needed     Route: Intramuscular  Ordered Dose: 1 mg     Ordered JUAREZ, KELLY S    01/07/18 1302 01/07/18 1304  ondansetron (ZOFRAN) injection 4 mg  Every 8 hours PRN     Route: Intravenous  Ordered Dose: 4 mg     Ordered JUAREZ, KELLY S    01/07/18 1302 01/07/18 1302  acetaminophen (TYLENOL) suppository 650 mg  Every 6 hours PRN     Route: Rectal  Ordered Dose: 650 mg     Ordered JUAREZ, KELLY S    01/07/18 1302 01/07/18 1302  fentaNYL (PF) (SUBLIMAZE) injection 25 mcg  Every 2 hours PRN     Route: Intravenous  Ordered Dose: 25 mcg     Ordered JUAREZ, KELLY S    01/07/18 1100 01/07/18 1059  sodium chloride 0.9 % bolus 1,000 mL  Once     Route: Intravenous  Ordered Dose: 1,000 mL     Last MAR action:  Stopped Arval Brandstetter KIM    01/07/18 1040 01/07/18 1039    Once     Route: Intravenous  Ordered Dose: 500 mL     Discontinued CHAFFAY, BRANDON A            Procedures:      I was present during key portions of any procedures performed.      Interpretations:      DDx/Discussion -- Pt with frequent falls d/t dizziness. Likely d/t low BP. Consider med effect, anemia, orthostasis. Highly doubt infection as cause. Consider ICH, rib fxs ,  pneumothorax.     Plan - Labs, Imaging, EKG and IVF     Pulse Oximetry Analysis -  Abnormal - SpO2 90 % on RA    Monitor -  A-fib @ 60s    EKG Interpretation (if applicable):  Reviewed and Interpreted by ER physician, France Ravens, MD   Rhythm: a-fib  Rate: 60s  Intervals/conduction: Normal intervals  ST Segments/T wave: No specifically ischemic appearing ST elevations/depressions. Nonspecific ST/T wave changes.    MIPS 2019:  Head CT in Trauma (Adult) Indication (Document Age >65, GCS,  Amnesia/LOC, AC, dangerous mechanism): age            PAST HISTORY        Primary Care Provider: Park, Trish Fountain, MD        PMH/PSH:    .     Past Medical History:   Diagnosis Date   . Anemia     iron low   . Black tarry stools 2019   . CKD (chronic kidney disease), stage III    . Coronary artery disease    . DOE (dyspnea on exertion)     mild   . Encounter for blood transfusion    . Glaucoma NEC    . Hyperlipidemia    . Hypertensive disorder    . Macrocytosis    . Paroxysmal A-fib    . Skin cancer     2006 removed mole   . SLE (systemic lupus erythematosus) 1990   . Ulcerative colitis, chronic    . Vision abnormalities        He has a past surgical history that includes Eye surgery; EGD, COLONOSCOPY (04/03/2012); EGD, COLONOSCOPY (N/A, 02/11/2017); Cardiac surgery; and EGD, COLONOSCOPY (N/A, 12/08/2017).      Social/Family History:      He reports that he quit smoking about 56 years ago. His smoking use included cigarettes. He has a 12.00 pack-year smoking history. He has never used smokeless tobacco. He reports current alcohol use. He reports that he does not use drugs.    History reviewed. No pertinent family history.      Listed Medications on Arrival:    .     Home Medications     Med List Status:  Pharmacy Completed Set By: Vic Blackbird at 01/07/2018  1:35 PM                AMIODARONE HCL PO     Take by mouth daily     atorvastatin (LIPITOR) 80 MG tablet     Take 1 tablet (80 mg total) by mouth nightly.     clopidogrel (PLAVIX) 75 mg tablet     Take 75 mg by mouth daily     furosemide (LASIX) 40 MG tablet     Take 40 mg by mouth daily     IRON PO     Take 200 mg by mouth daily.     lisinopril (PRINIVIL,ZESTRIL) 5 MG tablet     Take 5 mg by mouth daily     metoprolol succinate XL (TOPROL-XL) 25 MG 24 hr tablet     Take 1 tablet (25 mg total) by mouth daily     octreotide (SANDOSTATIN) 100 MCG/ML Solution     Inject 0.5 mLs (50 mcg total) into the skin 3 (three) times daily     pantoprazole (PROTONIX) 40 MG tablet      Take 40 mg by mouth 2 (two) times daily  Allergies: He has No Known Allergies.            RESULTS        Lab Results:      Results     Procedure Component Value Units Date/Time    Prepare/Crossmatch Red Blood Cells:  Two Units [[id number]] Collected:  01/07/18 1103     Updated:  01/07/18 1326     RBC Leukoreduced RBC Leukoreduced     BLUNIT [id number]     Status selected     PRODUCT CODE (NON READABLE) E4545V00     Expiration Date 562130865784     UTYPE O POS     ISBT CODE 5100     RBC Leukoreduced RBC Leukoreduced     BLUNIT [id number]     Status selected     PRODUCT CODE (NON READABLE) E0336V00     Expiration Date 201911212359     UTYPE O POS     ISBT CODE 5100    2 Units [[id number]] Collected:  01/07/18 1103     Updated:  01/07/18 1319    Platelets - Product, 1 Units [[id number]] Collected:  01/07/18 1311    Specimen:  Blood Updated:  01/07/18 1311    Lactic acid, plasma [[id number]] Collected:  01/07/18 1258    Specimen:  Blood Updated:  01/07/18 1258    Narrative:       No tourniquet;on ice    Glucose Whole Blood - POCT [[id number]] Collected:  01/07/18 1200     Updated:  01/07/18 1247     POCT - Glucose Whole blood 90 mg/dL     CBC with differential [[id number]]  (Abnormal) Collected:  01/07/18 1103    Specimen:  Blood Updated:  01/07/18 1214     WBC 8.54 x10 3/uL      Hgb 4.7 g/dL      Hematocrit 16.0 %      Platelets 389 x10 3/uL      RBC 1.55 x10 6/uL      MCV 109.7 fL      MCH 30.3 pg      MCHC 27.6 g/dL      RDW 21 %      MPV 10.1 fL      Nucleated RBC 0.0 /100 WBC      Absolute NRBC 0.00 x10 3/uL     Manual Differential [[id number]]  (Abnormal) Collected:  01/07/18 1103     Updated:  01/07/18 1214     Segmented Neutrophils 85 %      Band Neutrophils 0 %      Lymphocytes Manual 5 %      Monocytes Manual 9 %      Eosinophils Manual 0 %      Basophils Manual 1 %      Abs Seg Manual 7.26 x10 3/uL      Bands Absolute 0.00 x10 3/uL      Absolute Lymph Manual 0.43 x10 3/uL      Monocytes  Absolute 0.77 x10 3/uL      Absolute Eos Manual 0.00 x10 3/uL      Absolute Baso Manual 0.09 x10 3/uL     Cell MorpHology [[id number]]  (Abnormal) Collected:  01/07/18 1103     Updated:  01/07/18 1214     Cell Morphology: Abnormal     Platelet Estimate Normal     Macrocytic 2+     Polychromasia Present     Ovalocytes Present     Giant Platelets Present  RBC Emergency Uncrossmatched [[id number]] Collected:  01/07/18 1103     Updated:  01/07/18 1212    Type and Screen [[id number]] Collected:  01/07/18 1103    Specimen:  Blood Updated:  01/07/18 1156     ABO Rh O POS     AB Screen Gel NEG    PT/APTT [[id number]]  (Abnormal) Collected:  01/07/18 1103     Updated:  01/07/18 1148     PT 14.9 sec      PT INR 1.2     PTT 27 sec     RBC Emergency Uncrossmatched [[id number]] Collected:  01/07/18 1103     Updated:  01/07/18 1145    Troponin I [[id number]] Collected:  01/07/18 1103    Specimen:  Blood Updated:  01/07/18 1140     Troponin I 0.01 ng/mL     GFR [[id number]] Collected:  01/07/18 1103     Updated:  01/07/18 1134     EGFR 29.9    Magnesium [[id number]] Collected:  01/07/18 1103    Specimen:  Blood Updated:  01/07/18 1134     Magnesium 2.5 mg/dL     Phosphorus [[id number]] Collected:  01/07/18 1103    Specimen:  Blood Updated:  01/07/18 1134     Phosphorus 3.4 mg/dL     Comprehensive metabolic panel [[id number]]  (Abnormal) Collected:  01/07/18 1103    Specimen:  Blood Updated:  01/07/18 1134     Glucose 102 mg/dL      BUN 69.4 mg/dL      Creatinine 2.1 mg/dL      Sodium 854 mEq/L      Potassium 4.7 mEq/L      Chloride 113 mEq/L      CO2 19 mEq/L      Calcium 8.3 mg/dL      Protein, Total 5.5 g/dL      Albumin 2.9 g/dL      AST (SGOT) 12 U/L      ALT 15 U/L      Alkaline Phosphatase 131 U/L      Bilirubin, Total 0.3 mg/dL      Globulin 2.6 g/dL      Albumin/Globulin Ratio 1.1              Radiology Results:      CT Head without Contrast   Final Result      1. No acute intracranial hemorrhage is detected.    2. There is mild  subcortical, deep, and periventricular leukomalacia in   both cerebral hemispheres as well as in the infratentorial white matter.     This is suggestive of chronic small vessel ischemic change.          Theodoro Doing, MD    01/07/2018 11:36 AM      CT Chest without Contrast   Final Result         1. Multiple bilateral pulmonary nodules measuring up to 7 mm in diameter   2. Sclerotic bony abnormality in mid thoracic spine, posterior ribs, and   sternum suspicious for metastatic disease      J. Carole Binning, MD    01/07/2018 11:55 AM                  Supervisory Statements:        I have reviewed and agree with the history except as noted above. The pertinent physical exam has been documented.  I have reviewed and agree with the final ED diagnosis.  Scribe Attestation:      I was acting as a Neurosurgeon for Criss Alvine, MD on Cothern,Gaspar E  Treatment Team: Scribe: Despina Hidden     I am the first provider for this patient and I personally performed the services documented. Treatment Team: Scribe: Despina Hidden is scribing for me on Limehouse,Vin E. This note and the patient instructions accurately reflect work and decisions made by me.  Criss Alvine, MD          Criss Alvine, MD  01/09/18 [phone]

## 2018-01-07 NOTE — ED Notes (Signed)
Bed: S 21  Expected date:   Expected time:   Means of arrival: [id number] - [place]  Comments:  Medic 430 to 63

## 2018-01-07 NOTE — H&P (Signed)
[facility]- Medical Critical Care Service [facility])      ADMISSION- HISTORY AND PHYSICAL EXAM      Date Time: 01/07/18 12:48 PM  Patient Name: [name]  Attending Physician: Criss Alvine, MD  Primary Care Physician: Philipp Ovens, MD  Location/Room: S 21/S 21     Chief Complaint / Primary reason for ICU evaluation:  Weakness, dizziness    History of Presenting Illness:   [name] is a 84 y.o. 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  The patient was recently admitted from 12/03/17-12/12/17 for anemia and GI bleed.  EGD at the time showed many angioectasias of the stomach and duodenum some of which were actively oozing and ablated.  He also had epistaxis which required packing by ENT during that admission.  After admission, he restarted plavix and was doing well up until the last week.  He has been progressively more dizzy and weak and has fallen several times due to his legs buckling.  He says he never lost consciousness, no bowel or bladder incontinence, no tongue biting.  He has noticed dark black stool but is on iron and attributed the dark stools to iron.  He vomited yesterday but no hematemesis.  He denies CP, abdominal pain, SOB.  He has right sided tenderness to palpation due to his fall.  The patient has not taken any medication since Thursday because he hasn't felt well and in fact has stayed in bed for the last few days.     Problem List:   Problem List:   Patient Active Problem List   Diagnosis   . GI bleed   . Anemia   . Iron deficiency anemia secondary to inadequate dietary iron intake   . Anemia in other chronic diseases classified elsewhere     Shock, due to blood loss anemia due to angiectasis of stomach and duodenum in conjunction with coagulopathy on plavix  PMH atrial fibrillation on amiodarone  PMH HTN  PMH UC  PMH HLD  PMH CKD (baseline cr 1.3)  PMH CHF  EF 35%  PMH MR  PMH pulm HTN (RVSP 60)    Plan:   Dispo:    Critical Care services by organ system  NEURO:   NAI    CV:   Check LA  Monitor BP with transfusions  Hold statin, lisinopril, amiodarone  Blood pressure goal: MAP > 65 mmHg    PULM: NAI    GI:   GI consulted  octreotide  GI PPX: PPI  Nutrition: NPO    RENAL:   I/O Goal: positive   Monitor cr, UO  Renally dose meds     ID:   NAI    HEME:   Supportive blood products including platelets  SCD's  Pharmacologic ppx: not safe secondary to GI bleed  Consider OP work up for malignancy given CT chest    ENDO/RHEUM:   Glucose: goal 100 -180    Code Status: DNR/I. This was discussed with the patient and the patient's daughter at the bedside.    Past Medical History:     Past Medical History:   Diagnosis Date   . Anemia     iron low   . Black tarry stools 2019   . CKD (chronic kidney disease), stage III    . Coronary artery disease    . DOE (dyspnea on  exertion)     mild   . Encounter for blood transfusion    . Glaucoma NEC    . Hyperlipidemia    . Hypertensive disorder    . Macrocytosis    . Paroxysmal A-fib    . Skin cancer     2006 removed mole   . SLE (systemic lupus erythematosus) 1990   . Ulcerative colitis, chronic    . Vision abnormalities        Past Surgical History:     Past Surgical History:   Procedure Laterality Date   . CARDIAC SURGERY      3 stents   . EGD, COLONOSCOPY  04/03/2012    Procedure: EGD, COLONOSCOPY;  Surgeon: Lestine Mount, MD;  Location: [facility];  Service: Gastroenterology;  Laterality: N/A;   . EGD, COLONOSCOPY N/A 02/11/2017    Procedure: EGD, COLONOSCOPY;  Surgeon: Lestine Mount, MD;  Location: [facility];  Service: Gastroenterology;  Laterality: N/A;   . EGD, COLONOSCOPY N/A 12/08/2017    Procedure: EGD, COLONOSCOPY;  Surgeon: Jacqulyn Cane, MD;  Location: [id number];  Service: Gastroenterology;  Laterality: N/A;   . EYE SURGERY      cataract       Family History:   History reviewed. No pertinent family history.     Social History:     Social History     Socioeconomic History   . Marital status: Married     Spouse name: Not on file   . Number of children: Not on file   . Years of education: Not on file   . Highest education level: Not on file   Occupational History   . Not on file   Social Needs   . Financial resource strain: Not on file   . Food insecurity:     Worry: Not on file     Inability: Not on file   . Transportation needs:     Medical: Not on file     Non-medical: Not on file   Tobacco Use   . Smoking status: Former Smoker     Packs/day: 1.00     Years: 12.00     Pack years: 12.00     Types: Cigarettes     Last attempt to quit: 12/18/1961     Years since quitting: 56.0   . Smokeless tobacco: Never Used   Substance and Sexual Activity   . Alcohol use: Yes     Alcohol/week: 0.0 standard drinks     Comment: 0.5oz/d   . Drug use: No   . Sexual activity: Not on file   Lifestyle   . Physical activity:     Days per week: Not on file     Minutes per session: Not on file   . Stress: Not on file   Relationships   . Social connections:     Talks on phone: Not on file     Gets together: Not on file     Attends religious service: Not on file     Active member of club or organization: Not on file     Attends meetings of clubs or organizations: Not on file     Relationship status: Not on file   . Intimate partner violence:     Fear of current or ex partner: Not on file     Emotionally abused: Not on file     Physically abused: Not on file     Forced sexual activity: Not on file  Other Topics Concern   . Not on file   Social History Narrative   . Not on file       Allergies:   No Known Allergies    Medications:   Hospital medications:    Current Facility-Administered Medications   Medication Dose Route Frequency        Current Facility-Administered Medications   Medication   . pantoprozole (PROTONIX) infusion     Home meds per daughter:   Plavix  Tylenol  Lipitor  Lasix  Iron  Lisinopril  Amiodarone  Octreotide  PPI    Review of  Systems:   Due to critical status of the patient at this time, ROS was not obtained.    Physical Exam:   Temp:  [96.5 F (35.8 C)-97.7 F (36.5 C)] 96.7 F (35.9 C)  Heart Rate:  [64-76] 64  Resp Rate:  [18-25] 20  BP: (60-100)/(35-51) 89/49    Intake and Output Summary (Last 24 hours) at Date Time    Intake/Output Summary (Last 24 hours) at 01/07/2018 1248  Last data filed at 01/07/2018 1203  Gross per 24 hour   Intake 1000 ml   Output -   Net 1000 ml       Vent Settings:       BP (!) 89/49   Pulse 64   Temp (!) 96.7 F (35.9 C) (Temporal)   Resp 20   SpO2 100%      General Appearance:  chronically ill appearing and pale  Mental status:  alert, oriented to person, place, and time  Neuro:  alert, oriented, normal speech, no focal findings or movement disorder noted  Lungs: clear to auscultation, no wheezes, rales or rhonchi, symmetric air entry  Cardiac: normal rate, regular rhythm, normal S1, S2, no murmurs, rubs, clicks or gallops  Abdomen:  soft, nontender, nondistended, no masses or organomegaly  Extremities: peripheral pulses normal, no pedal edema, no clubbing or cyanosis   Skin:   normal coloration and turgor, no rashes, no suspicious skin lesions noted  Bruising at top of head  Other:      Labs:     Results     Procedure Component Value Units Date/Time    Glucose Whole Blood - POCT [[id number]] Collected:  01/07/18 1200     Updated:  01/07/18 1247     POCT - Glucose Whole blood 90 mg/dL     CBC with differential [[id number]]  (Abnormal) Collected:  01/07/18 1103    Specimen:  Blood Updated:  01/07/18 1214     WBC 8.54 x10 3/uL      Hgb 4.7 g/dL      Hematocrit 78.2 %      Platelets 389 x10 3/uL      RBC 1.55 x10 6/uL      MCV 109.7 fL      MCH 30.3 pg      MCHC 27.6 g/dL      RDW 21 %      MPV 10.1 fL      Nucleated RBC 0.0 /100 WBC      Absolute NRBC 0.00 x10 3/uL     Manual Differential [[id number]]  (Abnormal) Collected:  01/07/18 1103     Updated:  01/07/18 1214     Segmented Neutrophils 85 %       Band Neutrophils 0 %      Lymphocytes Manual 5 %      Monocytes Manual 9 %      Eosinophils Manual 0 %  Basophils Manual 1 %      Abs Seg Manual 7.26 x10 3/uL      Bands Absolute 0.00 x10 3/uL      Absolute Lymph Manual 0.43 x10 3/uL      Monocytes Absolute 0.77 x10 3/uL      Absolute Eos Manual 0.00 x10 3/uL      Absolute Baso Manual 0.09 x10 3/uL     Cell MorpHology [[id number]]  (Abnormal) Collected:  01/07/18 1103     Updated:  01/07/18 1214     Cell Morphology: Abnormal     Platelet Estimate Normal     Macrocytic 2+     Polychromasia Present     Ovalocytes Present     Giant Platelets Present    RBC Emergency Uncrossmatched [[id number]] Collected:  01/07/18 1103     Updated:  01/07/18 1212    Type and Screen [[id number]] Collected:  01/07/18 1103    Specimen:  Blood Updated:  01/07/18 1156     ABO Rh O POS     AB Screen Gel NEG    PT/APTT [[id number]]  (Abnormal) Collected:  01/07/18 1103     Updated:  01/07/18 1148     PT 14.9 sec      PT INR 1.2     PTT 27 sec     RBC Emergency Uncrossmatched [[id number]] Collected:  01/07/18 1103     Updated:  01/07/18 1145    Troponin I [[id number]] Collected:  01/07/18 1103    Specimen:  Blood Updated:  01/07/18 1140     Troponin I 0.01 ng/mL     GFR [[id number]] Collected:  01/07/18 1103     Updated:  01/07/18 1134     EGFR 29.9    Magnesium [[id number]] Collected:  01/07/18 1103    Specimen:  Blood Updated:  01/07/18 1134     Magnesium 2.5 mg/dL     Phosphorus [[id number]] Collected:  01/07/18 1103    Specimen:  Blood Updated:  01/07/18 1134     Phosphorus 3.4 mg/dL     Comprehensive metabolic panel [[id number]]  (Abnormal) Collected:  01/07/18 1103    Specimen:  Blood Updated:  01/07/18 1134     Glucose 102 mg/dL      BUN 30.1 mg/dL      Creatinine 2.1 mg/dL      Sodium 601 mEq/L      Potassium 4.7 mEq/L      Chloride 113 mEq/L      CO2 19 mEq/L      Calcium 8.3 mg/dL      Protein, Total 5.5 g/dL      Albumin 2.9 g/dL      AST (SGOT) 12 U/L      ALT 15 U/L      Alkaline  Phosphatase 131 U/L      Bilirubin, Total 0.3 mg/dL      Globulin 2.6 g/dL      Albumin/Globulin Ratio 1.1          Microbiology Results     None          Radiology / Imaging:     CT Head without Contrast   Final Result      1. No acute intracranial hemorrhage is detected.    2. There is mild subcortical, deep, and periventricular leukomalacia in   both cerebral hemispheres as well as in the infratentorial white matter.     This is suggestive of chronic small vessel ischemic change.  Theodoro Doing, MD    01/07/2018 11:36 AM      CT Chest without Contrast   Final Result         1. Multiple bilateral pulmonary nodules measuring up to 7 mm in diameter   2. Sclerotic bony abnormality in mid thoracic spine, posterior ribs, and   sternum suspicious for metastatic disease      J. Carole Binning, MD    01/07/2018 11:55 AM          This patient has a high probability of sudden clinically significant deterioration which requires the highest level of physician preparedness to intervene urgently. I managed/supervised life or organ supporting interventions that required frequent physician assessments. I devoted my full attention in the ICU to the direct care of this patient for this period of time.  Organ systems that require intensive critical care support (and are listed in detail on the assessment and plan above)  include: Cardiovascular    Any critical care time was performed today and is exclusive of teaching, billable procedures, and not overlapping with any other providers.    Critical care time: 49 minutes.    Signed by: Frederik Pear  01/07/2018 12:48 PM  GN:FAOZ, Trish Fountain, MD

## 2018-01-07 NOTE — Consults (Signed)
[facility]  CONSULTATION NOTE  [facility] call: [phone], [phone]  [facility] call: [phone]  After hours call [phone]        Date Time: 01/07/18 5:40 PM  Patient Name: [name],[name]  Requesting Physician: Frederik Pear, MD       Reason for Consultation:   Acute on chronic anemia  Dark stool    Assessment and Plan:   Assessment:  1. Dark stool. Associated with drop in H/H. Pt w known hx of recurrent GI bleeding exacerbated in setting of asa and Plavix. Extensive GI workup in the past, most recently 11/2017 revealing multiple AVMs that were cauterized. Suspect recurrent bleeding from further AVMs  2. Acute on chronic anemia, suspect from above  3. CAD s/p DES (02/22/17) on Plavix and aspirin, last dose 10/17  4. A fib on amiodarone     Plan:  1. Continue to trend H/H, transfuse prn.  2. Hold Plavix and aspirin nor now if acceptable..   3. Clear liquid diet tomorrow.  4. Consider repeat procedures pending patient's clinical course. If Plavix is held and bleeding stops we may not need further intervention, however, recommend cardiology evaluation regarding ongoing  ASA and plavix management given history of CAD s/p stent in setting of recurrent GI bleeding.  5. Supportive care primary team.    History:   [name] is a 84 y.o. male with a PMH of A fib, CAD s/p stent placement who presents to the hospital on 01/07/2018 with weakness and dark stool. He has a hx of GI bleeds in the past with most recent workup last month revealing AVMs that were cauterized. He notes his weakness gradually worsened over this past week. Dark stool present daily. Symptoms similar to his prior presentations for GI bleeds in the past. On admission his hemoglobin was found to be 4.7 and he was admitted to the ICU.    Past Medical History:     Past Medical History:   Diagnosis Date   . Anemia     iron low   . Black tarry stools 2019   . CKD (chronic kidney disease), stage III    . Coronary artery disease    . DOE  (dyspnea on exertion)     mild   . Encounter for blood transfusion    . Glaucoma NEC    . Hyperlipidemia    . Hypertensive disorder    . Macrocytosis    . Paroxysmal A-fib    . Skin cancer     2006 removed mole   . SLE (systemic lupus erythematosus) 1990   . Ulcerative colitis, chronic    . Vision abnormalities        Past Surgical History:     Past Surgical History:   Procedure Laterality Date   . CARDIAC SURGERY      3 stents   . EGD, COLONOSCOPY  04/03/2012    Procedure: EGD, COLONOSCOPY;  Surgeon: Lestine Mount, MD;  Location: [id number];  Service: Gastroenterology;  Laterality: N/A;   . EGD, COLONOSCOPY N/A 02/11/2017    Procedure: EGD, COLONOSCOPY;  Surgeon: Lestine Mount, MD;  Location: [facility];  Service: Gastroenterology;  Laterality: N/A;   . EGD, COLONOSCOPY N/A 12/08/2017    Procedure: EGD, COLONOSCOPY;  Surgeon: Jacqulyn Cane, MD;  Location: [id number];  Service: Gastroenterology;  Laterality: N/A;   . EYE SURGERY      cataract       Family History:   History reviewed.  No pertinent family history.    Social History:     Social History     Socioeconomic History   . Marital status: Married     Spouse name: Not on file   . Number of children: Not on file   . Years of education: Not on file   . Highest education level: Not on file   Occupational History   . Not on file   Social Needs   . Financial resource strain: Not on file   . Food insecurity:     Worry: Not on file     Inability: Not on file   . Transportation needs:     Medical: Not on file     Non-medical: Not on file   Tobacco Use   . Smoking status: Former Smoker     Packs/day: 1.00     Years: 12.00     Pack years: 12.00     Types: Cigarettes     Last attempt to quit: 12/18/1961     Years since quitting: 56.0   . Smokeless tobacco: Never Used   Substance and Sexual Activity   . Alcohol use: Yes     Alcohol/week: 0.0 standard drinks     Comment: 0.5oz/d   . Drug use: No   . Sexual activity: Not on file   Lifestyle   . Physical  activity:     Days per week: Not on file     Minutes per session: Not on file   . Stress: Not on file   Relationships   . Social connections:     Talks on phone: Not on file     Gets together: Not on file     Attends religious service: Not on file     Active member of club or organization: Not on file     Attends meetings of clubs or organizations: Not on file     Relationship status: Not on file   . Intimate partner violence:     Fear of current or ex partner: Not on file     Emotionally abused: Not on file     Physically abused: Not on file     Forced sexual activity: Not on file   Other Topics Concern   . Not on file   Social History Narrative   . Not on file       Allergies:   No Known Allergies    Medications:     Current Facility-Administered Medications   Medication Dose Route Frequency   . calcium GLUConate  1 g Intravenous Q1H   . insulin lispro  1-5 Units Subcutaneous Q4H SCH   . octreotide  50 mcg Subcutaneous V [facility]       Review of Systems:   General:  Patient denies lack of appetite, night sweats, weight loss, fatigue, fever.   HEENT:  Patient denies headache, hoarseness   Cardiovascular:  Patient denies swelling of hands/feet, fainting/blacking out, chest pain.   Respiratory:  Patient denies chronic cough, difficulty breathing, wheezing.   Genitourinary:  Patient denies blood in urine, dark urine  Musculoskeletal: Patient denies joint pain, joint stiffness, joint swelling.   Skin:  Patient denies itching, rash.   Neurologic:  Patient denies dizziness, loss of consciousness, fainting, confusion  Heme/Lymphatic:  Patient denies easy bruising.       Pertinent positives noted in HPI.    Physical Exam:     Vitals:    01/07/18 1715   BP: (!) 84/47  Pulse: 60   Resp: (!) 30   Temp:    SpO2: 99%       General appearance: Elderly, appears stated age and in NAD  Eyes: Sclera anicteric, pink conjunctivae, no ptosis  ENMT: mucous membranes moist, nose and ears appear normal.  Oropharynx clear.  Chest: Non  labored respirations, no audible wheezing, no clubbing or cyanosis  CV:  Regular rate and rhythm, no JVD, no LE edema  Abdomen: soft, non-tender, non-distended, no masses or organomegaly  Skin: +Pallor, no rashes, no suspicious skin lesions noted  Neuro: CN II-XII grossly intact.  No gross movement disorders noted.  Mental status: Appropriate affect, alert and oriented x 3    Labs Reviewed:     Recent Labs     01/07/18  1700 01/07/18  1103   WBC 9.65* 8.54   Hgb 6.7* 4.7*   Hematocrit 22.3* 17.0*   Platelets 305 389*   MCV 98.2* 109.7*       Recent Labs     01/07/18  1700 01/07/18  1103   Sodium 141 141   Potassium 4.2 4.7   Chloride 115* 113*   CO2 17* 19*   BUN 68.0* 75.0*   Creatinine 1.8* 2.1*   Glucose 90 102*   Calcium 7.4* 8.3   Magnesium 2.3 2.5   Phosphorus  --  3.4       Recent Labs     01/07/18  1103   AST (SGOT) 12   ALT 15   Alkaline Phosphatase 131*   Bilirubin, Total 0.3   Protein, Total 5.5*   Albumin 2.9*       Recent Labs     01/07/18  1103   PTT 27   PT 14.9   PT INR 1.2*        Radiology:   Radiological Procedure reviewed:      Endoscopy:     EGD 12/08/17: multiple AVMS in stomach + APC, AVMs in duodenum +APC    Colonscopy 12/08/17: 2 AVMs in descending +APC, int hemorrhoids    Colon 02/11/17:hemorrhoids, few large patchy angioectasias with bleeding in cecum (+coag with argon plasma)    EGD 02/11/17:few non-bleeding angioectasias in stomach (+argon beam coag), few angioectasias in duodenum and D2 (+ABC)    Colonoscopy 04/03/12:Angioectasia/AVM found in the ascending colon. Argon beam coagulation was applied to control bleeding. Hemorrhoids found.    EGD 04/03/12:Areas of angioectasia/AVM in the body of the stomach. Argon beam coagulation was applied to control bleeding. An ulcer was found in the prepyloric region. Areas of angioectasia/AVM were found in the duodenal bulb and 2nd portion of the duodenum.    Colonoscopy 07/21/11:Angioectasia/AVM found in the cecum, ascending colon and distal  transverse colon. Argon beam coagulation used to control bleeding. Severe diverticulosis in the sigmoid colon. Internal grade II hemorrhoids.     EGD 07/21/11:Multiple AVMs in the hiatal hernia treated with APC cautery. Areas of angioectasia/AVM were found in the body of the stomach and cardia. Argon beam coagulation was applied to control bleeding. Areas of angioectasia/AVM were found in the 2nd portion of the duodenum and 3rd portion of the duodenum. Argon beam coagulation was applied to control bleeding.

## 2018-01-07 NOTE — ED Provider Notes (Signed)
[facility] EMERGENCY DEPARTMENT RESIDENT H&P       CLINICAL INFORMATION        HPI:      Chief Complaint: Fall  .    [name] is a 84 y.o. male w/ a relevant PMHx anemia s/p prior transfusions, melena, CAD s/p 2x stents, Afib on amio, HTN, SLE, and UC who was BIB for an evaluation of a fall. Over the last 7 days the pt has experienced numerous falls - all unwitnessed. He reports feeling dizzy prior to the fall but insists they were all mechanical without a preceding CP, dyspnea, weakness, or syncope/LOC. He denies recent medication changes.    History obtained from: patient, family, EMS, review of prior chart    Nursing (triage) note reviewed for the following pertinent information:  BIBA; has had multiple falls over last week, most recently within last two days (10/17-18). Unknown if he hit his head this fall, but did hit his head last fall a couple days ago. States he was dizzy before he fell. No LOC per pt and EMS. On plavix. EMS BP laying 84/56, sitting 108/62. Patient states he hit his R ribs.      ROS:      Review of Systems   Constitutional: Negative.  Negative for chills, fatigue and fever.   HENT: Negative.    Eyes: Negative.  Negative for visual disturbance.   Respiratory: Negative.  Negative for shortness of breath and wheezing.    Cardiovascular: Negative.  Negative for chest pain, palpitations and leg swelling.   Gastrointestinal: Negative.    Endocrine: Negative.    Genitourinary: Negative.    Musculoskeletal: Negative for back pain, gait problem, joint swelling, myalgias, neck pain and neck stiffness.   Skin: Positive for color change. Negative for pallor, rash and wound.   Allergic/Immunologic: Negative.    Neurological: Positive for dizziness. Negative for tremors, seizures, syncope, facial asymmetry, speech difficulty, weakness, light-headedness, numbness and headaches.   Hematological: Bruises/bleeds easily.   Psychiatric/Behavioral: Negative.          Physical Exam:       Pulse 65  BP 100/47  Resp 20  SpO2 90 %  Temp 97.6 F (36.4 C)    Physical Exam  Vitals signs and nursing note reviewed.   Constitutional:       Appearance: Normal appearance.   HENT:      Head: Normocephalic and atraumatic.      Right Ear: Tympanic membrane normal.      Left Ear: Tympanic membrane normal.      Nose: Nose normal.      Mouth/Throat:      Mouth: Mucous membranes are dry.      Pharynx: No posterior oropharyngeal erythema.   Eyes:      General:         Right eye: No discharge.         Left eye: No discharge.      Extraocular Movements: Extraocular movements intact.      Conjunctiva/sclera: Conjunctivae normal.      Pupils: Pupils are equal, round, and reactive to light.   Neck:      Musculoskeletal: Normal range of motion and neck supple.   Cardiovascular:      Rate and Rhythm: Normal rate. Rhythm irregular.      Pulses: Normal pulses.      Heart sounds: Normal heart sounds.   Pulmonary:      Effort: Pulmonary effort is normal.  Breath sounds: Normal breath sounds.   Abdominal:      General: Abdomen is flat. Bowel sounds are normal. There is no distension.      Palpations: Abdomen is soft. There is no mass.      Tenderness: There is no tenderness. There is no right CVA tenderness, left CVA tenderness, guarding or rebound.      Hernia: No hernia is present.   Musculoskeletal: Normal range of motion.         General: Tenderness present. No swelling, deformity or signs of injury.      Right lower leg: No edema.      Left lower leg: No edema.      Comments: Tenderness to palpation over R thorax in the MCL approx rib 7-8.   Skin:     General: Skin is warm.      Capillary Refill: Capillary refill takes less than 2 seconds.   Neurological:      General: No focal deficit present.      Mental Status: He is alert and oriented to person, place, and time.      Cranial Nerves: No cranial nerve deficit.      Sensory: No sensory deficit.      Motor: No weakness.      Coordination: Coordination normal.       Deep Tendon Reflexes: Reflexes normal.   Psychiatric:         Mood and Affect: Mood normal.         Behavior: Behavior normal.                 PAST HISTORY        Primary Care Provider: Philipp Ovens, MD        PMH/PSH:    .     Past Medical History:   Diagnosis Date   . Anemia     iron low   . Black tarry stools 2019   . CKD (chronic kidney disease), stage III    . Coronary artery disease    . DOE (dyspnea on exertion)     mild   . Encounter for blood transfusion    . Glaucoma NEC    . Hyperlipidemia    . Hypertensive disorder    . Macrocytosis    . Paroxysmal A-fib    . Skin cancer     2006 removed mole   . SLE (systemic lupus erythematosus) 1990   . Ulcerative colitis, chronic    . Vision abnormalities        He has a past surgical history that includes Eye surgery; EGD, COLONOSCOPY (04/03/2012); EGD, COLONOSCOPY (N/A, 02/11/2017); Cardiac surgery; and EGD, COLONOSCOPY (N/A, 12/08/2017).      Social/Family History:      He reports that he quit smoking about 56 years ago. His smoking use included cigarettes. He has a 12.00 pack-year smoking history. He has never used smokeless tobacco. He reports current alcohol use. He reports that he does not use drugs.    History reviewed. No pertinent family history.      Listed Medications on Arrival:    .     Home Medications     Med List Status:  In Progress Set By: Gilmer Mor, RN at 01/07/2018 10:36 AM                acetaminophen (TYLENOL) 325 MG tablet     Take 2 tablets (650 mg total) by mouth every 4 (four) hours as needed  for Pain or Fever (Temperature greater than 38 C/100.4 F)     atorvastatin (LIPITOR) 80 MG tablet     Take 1 tablet (80 mg total) by mouth nightly.     furosemide (LASIX) 40 MG tablet     Take 40 mg by mouth daily     IRON PO     Take 200 mg by mouth daily.     lisinopril (PRINIVIL,ZESTRIL) 5 MG tablet     Take 5 mg by mouth daily     metoprolol succinate XL (TOPROL-XL) 25 MG 24 hr tablet     Take 1 tablet (25 mg total) by mouth daily     octreotide  (SANDOSTATIN) 100 MCG/ML Solution     Inject 0.5 mLs (50 mcg total) into the skin 3 (three) times daily     pantoprazole (PROTONIX) 40 MG tablet     Take 40 mg by mouth 2 (two) times daily         Allergies: He has No Known Allergies.            VISIT INFORMATION        Reassessments/Clinical Course:    01/07/2018 1100 BC - Pt was asymptomatic yet had soft pressures at 94/50. Chart r/v indicated no C/I to 1L of NS. Will re-evaluate for improvement. Cardiology to be consulted for syncopal evaluation.  01/07/2018 1300 BC - pt pressures began to drop and initial Hb was 4.3. ICU was consulted for admission and GI was made aware. Pt received 2 U,  01/07/2018 1330 BC - pt is mentating well and asymptomatic despite pressures systolic of low 90s.        Conversations with Other Providers:              Medications Given in the ED:    .     ED Medication Orders (From admission, onward)    Start Ordered     Status Ordering Provider    01/07/18 1040 01/07/18 1039  sodium chloride 0.9 % bolus 500 mL  Once     Route: Intravenous  Ordered Dose: 500 mL     Ordered Roena Sassaman A            Procedures:      Procedures      Assessment/Plan:                Altamese Cabal, MD  Resident  01/07/18 1042       Davene Jobin, Leonides Sake, MD  Resident  01/07/18 1405       Jaquita Bessire, Leonides Sake, MD  Resident  01/07/18 1714

## 2018-01-07 NOTE — ED Notes (Signed)
NURSING NOTE FOR THE RECEIVING INPATIENT NURSE     ED NURSE Burnice Logan [id number]   ED CHARGE NURSE [id number]   ADMISSION INFORMATION   [name] is a 84 y.o. male admitted with a diagnosis of:    1. Gastrointestinal hemorrhage, unspecified gastrointestinal hemorrhage type    2. Anemia, unspecified type       Isolation:  None  Place of residence / Living situation:  Home Independent   NURSING CARE   Mental Status alert   ADL ADLs:            Independent with all ADLs  Ambulation:  No difficulty   Pertinent Information  and Safety Concerns Has had multiple falls within last week, syncopal episodes, unknown if pt hit head with this fall. Hypotensive and unresponsive to fluids. Emergent 1U PRBC given, 2nd unit running currently with BP responding. At baseline, AAOx4. Has been lethargic during stay here.       VITAL SIGNS     Time of Last Set of Vitals 1240   Temperature 96.7   BP 89/49   Pulse 64 afib   Respirations 20   Pulse OX 100% RA        IV LINES     Peripheral IV 01/07/18 Left Forearm (Active)   Placement Date: 01/07/18   Size (Gauge): 18 G  Orientation: Left  Location: Forearm  Inserted by: EMS       Peripheral IV 01/07/18 Right Antecubital (Active)   Placement Date/Time: 01/07/18 1057   Size (Gauge): 18 G  Orientation: Right  Location: Antecubital  Site Prep: Chlorhexidine   Inserted by: Alvino Chapel, RN  Insertion attempts: 1  Specimens drawn with IV placement: Labs        LAB RESULTS     Labs Reviewed   CBC AND DIFFERENTIAL - Abnormal; Notable for the following components:       Result Value    Hgb 4.7 (*)     Hematocrit 17.0 (*)     Platelets 389 (*)     RBC 1.55 (*)     MCV 109.7 (*)     MCHC 27.6 (*)     RDW 21 (*)     All other components within normal limits   PT AND APTT - Abnormal; Notable for the following components:    PT INR 1.2 (*)     All other components within normal limits   COMPREHENSIVE METABOLIC PANEL - Abnormal; Notable for the following components:    Glucose 102 (*)     BUN 75.0 (*)      Creatinine 2.1 (*)     Chloride 113 (*)     CO2 19 (*)     Protein, Total 5.5 (*)     Albumin 2.9 (*)     Alkaline Phosphatase 131 (*)     All other components within normal limits   MANUAL DIFFERENTIAL - Abnormal; Notable for the following components:    Abs Seg Manual 7.26 (*)     Absolute Baso Manual 0.09 (*)     All other components within normal limits   CELL MORPHOLOGY - Abnormal; Notable for the following components:    Cell Morphology: Abnormal (*)     Macrocytic 2+ (*)     Polychromasia Present (*)     Ovalocytes Present (*)     Giant Platelets Present (*)     All other components within normal limits   MAGNESIUM   TROPONIN I  PHOSPHORUS   GFR   TYPE AND SCREEN   RBC EMERGENCY RELEASE   RBC EMERGENCY RELEASE

## 2018-01-08 DIAGNOSIS — D649 Anemia, unspecified: Secondary | ICD-10-CM

## 2018-01-08 DIAGNOSIS — N179 Acute kidney failure, unspecified: Secondary | ICD-10-CM

## 2018-01-08 LAB — GLUCOSE WHOLE BLOOD - POCT
Whole Blood Glucose POCT: 98 mg/dL (ref 70–100)
Whole Blood Glucose POCT: 87 mg/dL (ref 70–100)
Whole Blood Glucose POCT: 116 mg/dL — ABNORMAL HIGH (ref 70–100)
Whole Blood Glucose POCT: 110 mg/dL — ABNORMAL HIGH (ref 70–100)
Whole Blood Glucose POCT: 107 mg/dL — ABNORMAL HIGH (ref 70–100)
Whole Blood Glucose POCT: 114 mg/dL — ABNORMAL HIGH (ref 70–100)
Whole Blood Glucose POCT: 97 mg/dL (ref 70–100)

## 2018-01-08 LAB — CBC
Absolute NRBC: 0 10*3/uL (ref 0.00–0.00)
Absolute NRBC: 0 10*3/uL (ref 0.00–0.00)
Absolute NRBC: 0 10*3/uL (ref 0.00–0.00)
Absolute NRBC: 0 10*3/uL (ref 0.00–0.00)
Hematocrit: 25.2 % — ABNORMAL LOW (ref 37.6–49.6)
Hematocrit: 25.7 % — ABNORMAL LOW (ref 37.6–49.6)
Hematocrit: 26 % — ABNORMAL LOW (ref 37.6–49.6)
Hematocrit: 24.2 % — ABNORMAL LOW (ref 37.6–49.6)
Hgb: 7.7 g/dL — ABNORMAL LOW (ref 12.5–17.1)
Hgb: 7.9 g/dL — ABNORMAL LOW (ref 12.5–17.1)
Hgb: 7.9 g/dL — ABNORMAL LOW (ref 12.5–17.1)
Hgb: 7.4 g/dL — ABNORMAL LOW (ref 12.5–17.1)
MCH: 29.5 pg (ref 25.1–33.5)
MCH: 29.8 pg (ref 25.1–33.5)
MCH: 29.7 pg (ref 25.1–33.5)
MCH: 29.4 pg (ref 25.1–33.5)
MCHC: 30.6 g/dL — ABNORMAL LOW (ref 31.5–35.8)
MCHC: 30.7 g/dL — ABNORMAL LOW (ref 31.5–35.8)
MCHC: 30.4 g/dL — ABNORMAL LOW (ref 31.5–35.8)
MCHC: 30.6 g/dL — ABNORMAL LOW (ref 31.5–35.8)
MCV: 96.6 fL — ABNORMAL HIGH (ref 78.0–96.0)
MCV: 97 fL — ABNORMAL HIGH (ref 78.0–96.0)
MCV: 97.7 fL — ABNORMAL HIGH (ref 78.0–96.0)
MCV: 96 fL (ref 78.0–96.0)
MPV: 9.8 fL (ref 8.9–12.5)
MPV: 10.2 fL (ref 8.9–12.5)
MPV: 10.1 fL (ref 8.9–12.5)
MPV: 9.9 fL (ref 8.9–12.5)
Nucleated RBC: 0 /100 WBC (ref 0.0–0.0)
Nucleated RBC: 0 /100 WBC (ref 0.0–0.0)
Nucleated RBC: 0 /100 WBC (ref 0.0–0.0)
Nucleated RBC: 0 /100 WBC (ref 0.0–0.0)
Platelets: 253 10*3/uL (ref 142–346)
Platelets: 264 10*3/uL (ref 142–346)
Platelets: 250 10*3/uL (ref 142–346)
Platelets: 251 10*3/uL (ref 142–346)
RBC: 2.61 10*6/uL — ABNORMAL LOW (ref 4.20–5.90)
RBC: 2.65 10*6/uL — ABNORMAL LOW (ref 4.20–5.90)
RBC: 2.66 10*6/uL — ABNORMAL LOW (ref 4.20–5.90)
RBC: 2.52 10*6/uL — ABNORMAL LOW (ref 4.20–5.90)
RDW: 20 % — ABNORMAL HIGH (ref 11–15)
RDW: 20 % — ABNORMAL HIGH (ref 11–15)
RDW: 20 % — ABNORMAL HIGH (ref 11–15)
RDW: 20 % — ABNORMAL HIGH (ref 11–15)
WBC: 6.68 10*3/uL (ref 3.10–9.50)
WBC: 6.41 10*3/uL (ref 3.10–9.50)
WBC: 6.5 10*3/uL (ref 3.10–9.50)
WBC: 6.66 10*3/uL (ref 3.10–9.50)

## 2018-01-08 LAB — ECG 12-LEAD
Atrial Rate: 61 {beats}/min
Atrial Rate: 65 {beats}/min
P Axis: 81 degrees
P-R Interval: 220 ms
Q-T Interval: 464 ms
Q-T Interval: 502 ms
QRS Duration: 80 ms
QRS Duration: 108 ms
QTC Calculation (Bezet): 482 ms
QTC Calculation (Bezet): 522 ms
R Axis: -1 degrees
R Axis: -35 degrees
T Axis: 85 degrees
T Axis: 110 degrees
Ventricular Rate: 65 {beats}/min
Ventricular Rate: 65 {beats}/min

## 2018-01-08 LAB — MRSA CULTURE
Culture MRSA Surveillance: NEGATIVE
Culture MRSA Surveillance: NEGATIVE

## 2018-01-08 LAB — BASIC METABOLIC PANEL
BUN: 68 mg/dL — ABNORMAL HIGH (ref 9.0–28.0)
CO2: 17 mEq/L — ABNORMAL LOW (ref 22–29)
Calcium: 8 mg/dL (ref 7.9–10.2)
Chloride: 118 mEq/L — ABNORMAL HIGH (ref 100–111)
Creatinine: 2.1 mg/dL — ABNORMAL HIGH (ref 0.7–1.3)
Glucose: 97 mg/dL (ref 70–100)
Potassium: 4.7 mEq/L (ref 3.5–5.1)
Sodium: 144 mEq/L (ref 136–145)

## 2018-01-08 LAB — GFR: EGFR: 29.9

## 2018-01-08 LAB — MAGNESIUM: Magnesium: 2.5 mg/dL (ref 1.6–2.6)

## 2018-01-08 NOTE — Progress Notes (Signed)
[name] is a 84 y.o. male patient.  Active Problems:    GI bleed  [name] is a 84 y.o. 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  The patient was recently admitted from 12/03/17-12/12/17 for anemia and GI bleed.  EGD at the time showed many angioectasias of the stomach and duodenum some of which were actively oozing and ablated.  He also had epistaxis which required packing by ENT during that admission.  After admission, he restarted plavix and was doing well up until the last week.  He has been progressively more dizzy and weak and has fallen several times due to his legs buckling.  He says he never lost consciousness, no bowel or bladder incontinence, no tongue biting.  He has noticed dark black stool but is on iron and attributed the dark stools to iron.  He vomited yesterday but no hematemesis.  He denies CP, abdominal pain, SOB.  He has right sided tenderness to palpation due to his fall.  The patient has not taken any medication since Thursday because he hasn't felt well and in fact has stayed in bed for the last few days.   Past Medical History:   Diagnosis Date   . Anemia     iron low   . Black tarry stools 2019   . CKD (chronic kidney disease), stage III    . Coronary artery disease    . DOE (dyspnea on exertion)     mild   . Encounter for blood transfusion    . Glaucoma NEC    . Hyperlipidemia    . Hypertensive disorder    . Macrocytosis    . Paroxysmal A-fib    . Skin cancer     2006 removed mole   . SLE (systemic lupus erythematosus) 1990   . Ulcerative colitis, chronic    . Vision abnormalities      Current Facility-Administered Medications   Medication Dose Route Frequency Provider Last Rate Last Dose   . 0.9%  NaCl infusion   Intravenous PRN Tracie Harrier, [place]       . 0.9%  NaCl infusion   Intravenous PRN Tracie Harrier, [place]       . acetaminophen (TYLENOL) suppository  650 mg  650 mg Rectal Q6H PRN Tracie Harrier, [place]   650 mg at 01/07/18 1602   . dextrose (GLUCOSE) 40 % oral gel 15 g of glucose  15 g of glucose Oral PRN Leslee Home S, [place]        And   . dextrose 50 % bolus 12.5 g  12.5 g Intravenous PRN Tracie Harrier, [place]        And   . glucagon (rDNA) (GLUCAGEN) injection 1 mg  1 mg Intramuscular PRN Leslee Home S, [place]       . fentaNYL (PF) (SUBLIMAZE) injection 25 mcg  25 mcg Intravenous Q2H PRN Leslee Home S, [place]       . insulin lispro (HumaLOG) injection 1-5 Units  1-5 Units Subcutaneous Q4H [facility] Leslee Home S, [place]       . ondansetron [facility]) injection 4 mg  4 mg Intravenous Q8H PRN Leslee Home S, [place]         No Known Allergies  Blood pressure 103/51, pulse (!) 47, temperature 98 F (36.7 C), temperature source Oral, resp. rate 21, weight 53.5  kg (117 lb 15.1 oz), SpO2 100 %.    Subjective  Objective:  General Appearance:  Comfortable and in no acute distress.    Vital signs: (most recent): Blood pressure 103/51, pulse (!) 47, temperature 98 F (36.7 C), temperature source Oral, resp. rate 21, weight 53.5 kg (117 lb 15.1 oz), SpO2 100 %.  Vital signs are normal.    Output: Producing urine.    HEENT: Normal HEENT exam.    Lungs:  Normal effort and normal respiratory rate.  Breath sounds clear to auscultation.    Heart: Normal rate.  Regular rhythm.  S1 normal.    Abdomen: Abdomen is soft.  Bowel sounds are normal.   There is no abdominal tenderness.     Extremities: Normal range of motion.    Pulses: Distal pulses are intact.    Neurological: Patient is alert.    Pupils:  Pupils are equal, round, and reactive to light.    Skin:  Warm, dry and pale.      Results     Procedure Component Value Units Date/Time    CBC without differential [[id number]]  (Abnormal) Collected:  01/08/18 1619    Specimen:  Blood Updated:  01/08/18 1633     WBC 6.66 x10 3/uL      Hgb 7.4 g/dL      Hematocrit 40.9 %      Platelets 251 x10 3/uL      RBC 2.52 x10 6/uL      MCV 96.0 fL      MCH 29.4  pg      MCHC 30.6 g/dL      RDW 20 %      MPV 9.9 fL      Nucleated RBC 0.0 /100 WBC      Absolute NRBC 0.00 x10 3/uL     Narrative:       Start to draw 1 hour after current blood transfusion finishes    MRSA culture - Throat (If not done in triage) [[id number]] Collected:  01/07/18 1700    Specimen:  Body Fluid from Throat Updated:  01/08/18 1619     Culture MRSA Surveillance Negative for Methicillin Resistant Staph aureus    MRSA culture - Nares (If not done in triage) [[id number]] Collected:  01/07/18 1700    Specimen:  Body Fluid from Nares Updated:  01/08/18 1619     Culture MRSA Surveillance Negative for Methicillin Resistant Staph aureus    Glucose Whole Blood - POCT [[id number]]  (Abnormal) Collected:  01/08/18 1543     Updated:  01/08/18 1550     POCT - Glucose Whole blood 110 mg/dL     Glucose Whole Blood - POCT [[id number]]  (Abnormal) Collected:  01/08/18 1313     Updated:  01/08/18 1315     POCT - Glucose Whole blood 116 mg/dL     CBC without differential [[id number]]  (Abnormal) Collected:  01/08/18 1022    Specimen:  Blood Updated:  01/08/18 1031     WBC 6.50 x10 3/uL      Hgb 7.9 g/dL      Hematocrit 64.4 %      Platelets 250 x10 3/uL      RBC 2.66 x10 6/uL      MCV 97.7 fL      MCH 29.7 pg      MCHC 30.4 g/dL      RDW 20 %      MPV 10.1 fL      Nucleated RBC  0.0 /100 WBC      Absolute NRBC 0.00 x10 3/uL     Glucose Whole Blood - POCT [[id number]] Collected:  01/08/18 0741     Updated:  01/08/18 0745     POCT - Glucose Whole blood 87 mg/dL     Magnesium [[id number]] Collected:  01/08/18 0520    Specimen:  Blood Updated:  01/08/18 0605     Magnesium 2.5 mg/dL     Narrative:       Start to draw 1 hour after current blood transfusion finishes    Basic Metabolic Panel [[id number]]  (Abnormal) Collected:  01/08/18 0520    Specimen:  Blood Updated:  01/08/18 0605     Glucose 97 mg/dL      BUN 08.6 mg/dL      Creatinine 2.1 mg/dL      Calcium 8.0 mg/dL      Sodium 578 mEq/L      Potassium 4.7 mEq/L      Chloride  118 mEq/L      CO2 17 mEq/L     Narrative:       Start to draw 1 hour after current blood transfusion finishes    GFR [[id number]] Collected:  01/08/18 0520     Updated:  01/08/18 0605     EGFR 29.9    Narrative:       Start to draw 1 hour after current blood transfusion finishes    CBC without differential [[id number]]  (Abnormal) Collected:  01/08/18 0520    Specimen:  Blood Updated:  01/08/18 0542     WBC 6.41 x10 3/uL      Hgb 7.9 g/dL      Hematocrit 27.2 %      Platelets 264 x10 3/uL      RBC 2.65 x10 6/uL      MCV 97.0 fL      MCH 29.8 pg      MCHC 30.7 g/dL      RDW 20 %      MPV 10.2 fL      Nucleated RBC 0.0 /100 WBC      Absolute NRBC 0.00 x10 3/uL     Narrative:       Start to draw 1 hour after current blood transfusion finishes    Glucose Whole Blood - POCT [[id number]] Collected:  01/08/18 0401     Updated:  01/08/18 0409     POCT - Glucose Whole blood 98 mg/dL     CBC without differential [[id number]]  (Abnormal) Collected:  01/08/18 0059    Specimen:  Blood Updated:  01/08/18 0118     WBC 6.68 x10 3/uL      Hgb 7.7 g/dL      Hematocrit 75.6 %      Platelets 253 x10 3/uL      RBC 2.61 x10 6/uL      MCV 96.6 fL      MCH 29.5 pg      MCHC 30.6 g/dL      RDW 20 %      MPV 9.8 fL      Nucleated RBC 0.0 /100 WBC      Absolute NRBC 0.00 x10 3/uL     Narrative:       Start to draw 1 hour after current blood transfusion finishes    Platelets - Product, 1 Units [[id number]] Collected:  01/07/18 1311    Specimen:  Blood Updated:  01/08/18 0042     Platelet Product- Platelets  Leukoreduced/Irradiated     BLUNIT [id number]     Status transfused     PRODUCT CODE (NON READABLE) E8331V00     Expiration Date 119147829562     UTYPE A POS     ISBT CODE 6200    Prepare/Crossmatch Red Blood Cells:  Two Units [[id number]] Collected:  01/07/18 1103     Updated:  01/08/18 0042     RBC Leukoreduced RBC Leukoreduced     BLUNIT [id number]     Status selected     PRODUCT CODE (NON READABLE) E4545V00     Expiration Date  [id number]     UTYPE O POS     ISBT CODE 5100     RBC Leukoreduced RBC Leukoreduced     BLUNIT [id number]     Status transfused     PRODUCT CODE (NON READABLE) E0336V00     Expiration Date [id number]     UTYPE O POS     ISBT CODE 5100    RBC Emergency Uncrossmatched [[id number]] Collected:  01/07/18 1103     Updated:  01/08/18 0042     RBC Leukoreduced RBC Leukoreduced     BLUNIT [id number]     Status transfused     PRODUCT CODE (NON READABLE) E0336V00     Expiration Date [id number]     UTYPE O POS     ISBT CODE 5100     RBC Leukoreduced RBC Leukoreduced     BLUNIT [id number]     Status transfused     PRODUCT CODE (NON READABLE) E0336V00     Expiration Date [id number]     UTYPE O POS     ISBT CODE 5100    Glucose Whole Blood - POCT [[id number]] Collected:  01/07/18 2339     Updated:  01/07/18 2342     POCT - Glucose Whole blood 90 mg/dL     CBC with Differential [[id number]]  (Abnormal) Collected:  01/07/18 2124    Specimen:  Blood Updated:  01/07/18 2135     WBC 7.86 x10 3/uL      Hgb 8.1 g/dL      Hematocrit 69.4 %      Platelets 262 x10 3/uL      RBC 2.72 x10 6/uL      MCV 96.0 fL      MCH 29.8 pg      MCHC 31.0 g/dL      RDW 19 %      MPV 9.7 fL      Neutrophils 85.3 %      Lymphocytes Automated 4.6 %      Monocytes 9.3 %      Eosinophils Automated 0.1 %      Basophils Automated 0.3 %      Immature Granulocyte 0.4 %      Nucleated RBC 0.0 /100 WBC      Neutrophils Absolute 6.71 x10 3/uL      Abs Lymph Automated 0.36 x10 3/uL      Abs Mono Automated 0.73 x10 3/uL      Abs Eos Automated 0.01 x10 3/uL      Absolute Baso Automated 0.02 x10 3/uL      Absolute Immature Granulocyte 0.03 x10 3/uL      Absolute NRBC 0.00 x10 3/uL     Hemoglobin A1c [[id number]] Collected:  01/07/18 1657    Specimen:  Blood Updated:  01/07/18 2057     Hemoglobin A1C 4.7 %      Average Estimated Glucose 88.2 mg/dL  Glucose Whole Blood - POCT [[id number]] Collected:  01/07/18 1955     Updated:  01/07/18 2011     POCT -  Glucose Whole blood 96 mg/dL     Glucose Whole Blood - POCT [[id number]] Collected:  01/07/18 1715     Updated:  01/07/18 1743     POCT - Glucose Whole blood 89 mg/dL     1 Units [[id number]] Collected:  01/07/18 1103     Updated:  01/07/18 1733    CBC without differential [[id number]]  (Abnormal) Collected:  01/07/18 1700    Specimen:  Blood Updated:  01/07/18 1729     WBC 9.65 x10 3/uL      Hgb 6.7 g/dL      Hematocrit 62.9 %      Platelets 305 x10 3/uL      RBC 2.27 x10 6/uL      MCV 98.2 fL      MCH 29.5 pg      MCHC 30.0 g/dL      RDW 20 %      MPV 10.1 fL      Nucleated RBC 0.2 /100 WBC      Absolute NRBC 0.02 x10 3/uL     Narrative:       Start to draw 1 hour after current blood transfusion finishes    Magnesium [[id number]] Collected:  01/07/18 1700    Specimen:  Blood Updated:  01/07/18 1729     Magnesium 2.3 mg/dL     Narrative:       Start to draw 1 hour after current blood transfusion finishes    GFR [[id number]] Collected:  01/07/18 1700     Updated:  01/07/18 1729     EGFR 35.7    Narrative:       Start to draw 1 hour after current blood transfusion finishes    Basic Metabolic Panel [[id number]]  (Abnormal) Collected:  01/07/18 1700    Specimen:  Blood Updated:  01/07/18 1729     Glucose 90 mg/dL      BUN 59.5 mg/dL      Creatinine 1.8 mg/dL      Calcium 7.4 mg/dL      Sodium 638 mEq/L      Potassium 4.2 mEq/L      Chloride 115 mEq/L      CO2 17 mEq/L     Narrative:       Start to draw 1 hour after current blood transfusion finishes    Lactic acid, plasma [[id number]] Collected:  01/07/18 1700    Specimen:  Blood Updated:  01/07/18 1714     Lactic acid 1.2 mmol/L       Ct Head Without Contrast    Result Date: 01/07/2018  1. No acute intracranial hemorrhage is detected. 2. There is mild subcortical, deep, and periventricular leukomalacia in both cerebral hemispheres as well as in the infratentorial white matter.   This is suggestive of chronic small vessel ischemic change. Theodoro Doing, MD 01/07/2018  11:36 AM    Ct Chest Without Contrast    Result Date: 01/07/2018  1. Multiple bilateral pulmonary nodules measuring up to 7 mm in diameter 2. Sclerotic bony abnormality in mid thoracic spine, posterior ribs, and sternum suspicious for metastatic disease J. Carole Binning, MD 01/07/2018 11:55 AM    Xr Chest Ap Portable    Result Date: 12/10/2017   Vascular congestion, left greater than right basilar atelectasis and left effusion. Stable cardiac enlargement. Prince Solian,  MD 12/10/2017 1:08 PM    Assessment & Plan     1.  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  Acute on chronic anemia  Iron TIBC B12 levels  Hematology consult    3.  Multiple bilateral pulmonary nodules  CT chest showed multiple pulmonary nodules measuring up to 7 mm in diameter and sclerotic bony abnormality in mid thoracic spine posterior ribs sternum suspicious for malignancy  Consult hematology for malignancy work-up    4.  History of CAD  Hold aspirin Plavix due to GI bleed    5.  Atrial fibrillation  On amiodarone    6.  Hypertension  Stable    7.  DVT prophylaxis  SCD      Elana Alm [facility]  01/08/2018

## 2018-01-08 NOTE — Consults (Signed)
[facility]  NP/PA [facility] [phone] and [phone] I Answering service: [phone]  Patient of Dr. Tarry Kos         Referring Physician:  ICU team    Reason for Consultation:  Anti-platelet recommendations    Date of Service: 01/08/2018    Assessment and Plan:   Acute on chronic anemia  - He initially presented with dark stools and was found to have H/H significantly depressed at 4.7/17. He has received 3 units PRBCs with improvement in H/H today, which is stable  - GI suspects AVMs exacerbated by plavix  - His last stent was placed in 02/2017 so given it has been over 10 months since the last stent, it is reasonable to stop plavix at this time as the risks of bleeding outweigh the benefits  - GI following    Pre-operative cardiac risk stratification  - He denies any symptoms concerning for angina  - He is low-moderate risk for any endoscopic procedures if needed given hx of CAD and CHF    Two-vessel obstructive CAD s/p PCI/DES to LM/LCx on 02/22/17  - No evidence of active ischemia at this time. Angina free  - Continue statin  - Plavix can be stopped as above    Paroxysmal Atrial Fibrillation (CHADS2VASc 5)  - Currently in SR with PACs  - On oral amiodarone to maintain NSR  - Not on AC secondary to recurrent bleeding; therefore high risk of bleeding as noted above  - Consider Watch[facility] as outpatient    Acute HFrEF 36% (via echo done 02/11/2017)  - Appears compensated on exam  - Optimize GDMT: Resume Lisinopril 5mg  daily and start low dose metoprolol succinate 12.5 mg daily if SBP can tolerate   - Repeat echo as outpatient to reassess LV function    Moderate Pulmonary HTN ( via echo done 01/2017)  - Follow prospectively as outpatient    HTN  - Stable    HLD  - On statin    AKI  - Cr elevated at 2.1    Thank you for this consultation. We will follow with you.    Patient Active Problem List    Diagnosis Date Noted   . Iron deficiency anemia secondary to inadequate dietary  iron intake [D50.8] 02/22/2017   . Anemia in other chronic diseases classified elsewhere [D63.8] 02/22/2017   . HGI bleed [K92.2] 02/09/2017   . Anemia [D64.9] 02/09/2017       Chief Complaint:     S/p mechanical fall    History of Present Illness:     [name] with PMH HTN, PAF not on AC d/t GIB, hx of GIB, CAD s/p DES to CTO of LCX in 02/2017, CHF with LVEF 36% in 01/2017 presents s/p mechanical fall. He reports developing dizziness which resulted in a mechanical fall. He was found to have significant anemia in the ER with H/H 4.7/17. He has received multiple units of PRBCs and his H/H is now stable. Cardiology was consulted in regards to anti-platelet therapy. He underwent LHC in 02/2017 and received DES x3 to a CTO of LCX. He currently denies chest pain, dyspnea, exertional dyspnea, orthopnea, PND, or edema.  Denies palpitations.  EKG is unremarkable.    Prior Cardiac History/Testing:     LHC 02/2017:  Conclusions    1. There is severe coronary artery disease.    2. Proximal Circumflex Coronary Artery was treated with three Balloon and  Drug Eluting Stent.    3. Mid  Circumflex Coronary Artery was treated with Balloon and Drug Eluting  Stent.    4. Successful CTO intervention of the left circumflex artery.    Recommendations    * Continue current medical management and risk factor modification.    Diagnostic Findings    * Coronary angiography shows right dominance.    * The LM has no significant atherosclerosis.    * The LAD has no significant atherosclerosis.    * The RCA has no significant atherosclerosis.    * Proximal Circumflex Coronary Artery: Severe 100% stenosis, No dissection.    * Mid Circumflex Coronary Artery: Severe 100% stenosis.    Echo 01/2017:  Summary    * The left ventricle is normal in size.    * Left ventricular systolic function is moderately decreased with an  ejection fraction by Biplane Method of Discs of  36 %.    * Left Ventricular diastolic filling parameters are consistent with Grade  II  diastolic dysfunction (pseudonormal pattern).    * Right ventricular size and systolic function are normal.    * Mild bi-atrial enlargement.    * Moderate to severe mitral valve regurgitation.    * There is mild tricuspid regurgitation.    * Moderate pulmonary hypertension with estimated right ventricular systolic  pressure of  63.29 mmHg.    Past History:     Past Medical History:   Diagnosis Date   . Anemia     iron low   . Black tarry stools 2019   . CKD (chronic kidney disease), stage III    . Coronary artery disease    . DOE (dyspnea on exertion)     mild   . Encounter for blood transfusion    . Glaucoma NEC    . Hyperlipidemia    . Hypertensive disorder    . Macrocytosis    . Paroxysmal A-fib    . Skin cancer     2006 removed mole   . SLE (systemic lupus erythematosus) 1990   . Ulcerative colitis, chronic    . Vision abnormalities       Past Surgical History:   Procedure Laterality Date   . CARDIAC SURGERY      3 stents   . EGD, COLONOSCOPY  04/03/2012    Procedure: EGD, COLONOSCOPY;  Surgeon: Lestine Mount, MD;  Location: [facility];  Service: Gastroenterology;  Laterality: N/A;   . EGD, COLONOSCOPY N/A 02/11/2017    Procedure: EGD, COLONOSCOPY;  Surgeon: Lestine Mount, MD;  Location: [id number];  Service: Gastroenterology;  Laterality: N/A;   . EGD, COLONOSCOPY N/A 12/08/2017    Procedure: EGD, COLONOSCOPY;  Surgeon: Jacqulyn Cane, MD;  Location: [id number];  Service: Gastroenterology;  Laterality: N/A;   . EYE SURGERY      cataract      Medications Prior to Admission   Medication Sig Dispense Refill Last Dose   . AMIODARONE HCL PO Take by mouth daily   Past Week   . atorvastatin (LIPITOR) 80 MG tablet Take 1 tablet (80 mg total) by mouth nightly. 30 tablet 1 Past Week   . clopidogrel (PLAVIX) 75 mg tablet Take 75 mg by mouth daily   Past Week   . furosemide (LASIX) 40 MG tablet Take 40 mg by mouth daily   Past Week   . IRON PO Take 200 mg by mouth daily.   Past Week   . lisinopril  (PRINIVIL,ZESTRIL) 5 MG tablet Take 5 mg by mouth daily  Past Week   . pantoprazole (PROTONIX) 40 MG tablet Take 40 mg by mouth 2 (two) times daily   Past Week   . metoprolol succinate XL (TOPROL-XL) 25 MG 24 hr tablet Take 1 tablet (25 mg total) by mouth daily 30 tablet 0    . octreotide (SANDOSTATIN) 100 MCG/ML Solution Inject 0.5 mLs (50 mcg total) into the skin 3 (three) times daily 15 mL 0      No Known Allergies   Current Facility-Administered Medications   Medication Dose Route Frequency Provider Last Rate Last Dose   . 0.9%  NaCl infusion   Intravenous PRN Tracie Harrier, [place]       . 0.9%  NaCl infusion   Intravenous PRN Tracie Harrier, [place]       . acetaminophen (TYLENOL) suppository 650 mg  650 mg Rectal Q6H PRN Tracie Harrier, [place]   650 mg at 01/07/18 1602   . dextrose (GLUCOSE) 40 % oral gel 15 g of glucose  15 g of glucose Oral PRN Leslee Home S, [place]        And   . dextrose 50 % bolus 12.5 g  12.5 g Intravenous PRN Tracie Harrier, [place]        And   . glucagon (rDNA) (GLUCAGEN) injection 1 mg  1 mg Intramuscular PRN Leslee Home S, [place]       . fentaNYL (PF) (SUBLIMAZE) injection 25 mcg  25 mcg Intravenous Q2H PRN Leslee Home S, [place]       . insulin lispro (HumaLOG) injection 1-5 Units  1-5 Units Subcutaneous Q4H [facility] Leslee Home S, [place]       . ondansetron [facility]) injection 4 mg  4 mg Intravenous Q8H PRN Tracie Harrier, [place]         Social History     Tobacco Use   . Smoking status: Former Smoker     Packs/day: 1.00     Years: 12.00     Pack years: 12.00     Types: Cigarettes     Last attempt to quit: 12/18/1961     Years since quitting: 56.0   . Smokeless tobacco: Never Used   Substance Use Topics   . Alcohol use: Yes     Alcohol/week: 0.0 standard drinks     Comment: 0.5oz/d        Family History:   History reviewed. No pertinent family history.       Review of Systems:     General: Denies fevers, chills, or sweats.  Denies fatigue, weight gain, or weight loss.    Head:  Denies headaches, vision loss,  eye pain.  ENT:  Denies nasal congestion, sinus pain, sore throat, oral bleeding  Respiratory:  Denies cough, wheezing, or sputum production.    Cardiovascular:  See HPI.  Denies calf, thigh, or buttock claudication.    Gastrointestinal:  dark stools. Denies abdominal pain, nausea, vomiting, diarrhea, constipation.   Neurological:  dizziness s/p mechanical fall. Denies lightheadedness, syncope, or focal weakness.    Musculoskeletal:  Denies arthritis or myalgias.   Genitourinary:  Denies dysuria or bladder problems.  Skin:  Denies rash, itching, or new lesions.    Hematologic:  Hx of GI bleeds. Denies easy bruising or bleeding.    Endocrine:  Denies heat/cold intolerance or increased thirst.  Psychiatric:  Denies depression or anxiety.    Complete ten point review of systems was performed.  Except as noted above and in the HPI, no other  pertinent positives or negatives were identified.      Physical Exam:     BP 123/57   Pulse (!) 56   Temp 97.9 F (36.6 C) (Oral)   Resp (!) 31   Wt 53.5 kg (117 lb 15.1 oz)   SpO2 98%   BMI 19.63 kg/[name]     Intake and Output Summary (Last 24 hours) at Date Time    Intake/Output Summary (Last 24 hours) at 01/08/2018 1221  Last data filed at 01/08/2018 1000  Gross per 24 hour   Intake 1097.17 ml   Output 1055 ml   Net 42.17 ml       General:  Well-nourished, well-developed.  Alert and in no apparent distress. Frail   Eyes:  Anicteric sclera.  Pupils equal and round.    ENT:  Hearing grossly intact. Lips moist, color appropriate for race.  Neck:  2+ carotids bilaterally with normal upstroke; no carotid bruits.  No JVD.  No LAD.  Lungs:  Clear to auscultation bilaterally. No wheezes or crackles.  Respiratory effort unlabored, chest expansion symmetric.  Cardiovascular:  PMI non-displaced.  No tenderness to palpation.  Regular rate and rhythm. Normal S1 and S2.  No S3 or S4.  No murmurs or rubs.  Abdomen:  Soft, non-tender, non-distended.  No organomegaly.  No pulsatile masses.   Extremities:  2+ bilateral radial and pedal pulses.  No edema.  Skin:  Warm, dry, and intact. No rashes or lesions on exposed portions of skin. Pale   Neurologic:  Moving all four extremities; exam grossly non-focal      Ancillary Data:     Cardiac Data Review  Echo Results     None        EKG Results     Procedure Component Value Units Date/Time    ECG 12 Lead [[id number]] Collected:  01/07/18 1212     Updated:  01/08/18 1133     Ventricular Rate 65 BPM      Atrial Rate 65 BPM      P-R Interval 220 ms      QRS Duration 108 ms      Q-T Interval 502 ms      QTC Calculation (Bezet) 522 ms      P Axis 81 degrees      R Axis -35 degrees      T Axis 110 degrees     Narrative:       SINUS RHYTHM WITH 1ST DEGREE A-V BLOCK WITH PREMATURE ATRIAL COMPLEXES  LEFT AXIS DEVIATION  LOW VOLTAGE QRS  SEPTAL INFARCT , AGE UNDETERMINED  PROLONGED QT  ABNORMAL ECG  WHEN COMPARED WITH ECG OF 07-Jan-2018 10:27, (UNCONFIRMED)  ST NO LONGER DEPRESSED IN INFERIOR LEADS  ST NOW DEPRESSED IN LATERAL LEADS  Confirmed by Neomia Dear MD, [name] ([id number]) on 01/08/2018 11:33:05 AM    ECG 12 Lead [[id number]] Collected:  01/07/18 1027     Updated:  01/08/18 1132     Ventricular Rate 65 BPM      Atrial Rate 61 BPM      P-R Interval -- ms      QRS Duration 80 ms      Q-T Interval 464 ms      QTC Calculation (Bezet) 482 ms      P Axis -- degrees      R Axis -1 degrees      T Axis 85 degrees     Narrative:       NORMAL SINUS RHYTHM  PREMATURE ATRIAL CONTRACTIONS  LOW VOLTAGE QRS  NONSPECIFIC ST ABNORMALITY  ABNORMAL ECG  WHEN COMPARED WITH ECG OF 12-Dec-2017 02:48, ST NOW DEPRESSED IN INFERIOR LEADS  ST ELEVATION NOW PRESENT IN LATERAL LEADS  Confirmed by Neomia Dear MD, [name] ([id number]) on 01/08/2018 11:32:34 AM    EKG SCAN [[id number]] Resulted:  01/08/18 1133     Updated:  01/08/18 1133          Labs:   Lab Results   Component Value Date    WBC 6.50 01/08/2018    HGB 7.9 (L) 01/08/2018    HCT 26.0 (L) 01/08/2018    MCV 97.7 (H) 01/08/2018    PLT 250 01/08/2018     No results  found for: CKTOTAL, CKMB, CKMBINDEX  No results found for: CKTOTAL  Lab Results   Component Value Date    BUN 68.0 (H) 01/08/2018    NA 144 01/08/2018    K 4.7 01/08/2018    CL 118 (H) 01/08/2018    CO2 17 (L) 01/08/2018       Chest X-Ray:  Radiology Results (24 Hour)     ** No results found for the last 24 hours. **

## 2018-01-08 NOTE — Progress Notes (Signed)
CASE MANAGEMENT PROGRESS NOTE  GI bleed [K92.2]  Anemia, unspecified type [D64.9]  Gastrointestinal hemorrhage, unspecified gastrointestinal hemorrhage type [K92.2]  CM met with patient at bedside. CM introduced self and role. Patient was receptive to CM visit.     Situation:  [name] is a 84 y.o. 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  The patient was recently admitted from 12/03/17-12/12/17 for anemia and GI bleed. EGD at the time showed many angioectasias of the stomach and duodenum some of which were actively oozing and ablated. He also had epistaxis which required packing by ENT during that admission. After admission, he restarted plavix and was doing well up until the last week. He has been progressively more dizzy and weak and has fallen several times due to his legs buckling. He says he never lost consciousness, no bowel or bladder incontinence, no tongue biting. He has noticed dark black stool but is on iron and attributed the dark stools to iron. He vomited yesterday but no hematemesis. Pt is alert and oriented x3.    Background:  Pt stated that he lives alone and he was independent prior to hospital visit. He has no DMEs at home. Pt stated that she had not been to a rehab center before or utilized any HH services before. Pt goes to [facility] as his primary pharmacy. Pt has no advance directive. Pt's daughter provides transportation as needed. CM verified pt's address, phone, emergency contact (daughter-Anne, [phone]), PCP, and insurance.    Assessment:  Patient has insurance coverage  Patient lives alone  Patient may need transport home   Pending PT/OT evaluations for [place] recommendations     Recommendations/Response:  Pending PT/OT evals.    Anticipated DISPO: Pending PT/OT evals    Patient and family advised that patient will soon be medically cleared by physician and can  transition to a lower level of post-hospital care. Discussed options for post-acute care, including Home with Home Health, Acute Rehab vs. SNF placement. Case management will continue to work with patient and family to finalize discharge plan and disposition.        01/08/18 1250   Patient Type   Within 30 Days of Previous Admission? Yes   Healthcare Decisions   Interviewed: Patient   Orientation/Decision Making Abilities of Patient Alert and Oriented x3, able to make decisions   Advance Directive Patient does not have advance directive   Healthcare Agent Appointed No   Prior to admission   Prior level of function Independent with ADLs;Ambulates independently   Type of Residence Private residence   Home Layout Multi-level   Have running water, electricity, heat, etc? Yes   Living Arrangements Alone   How do you get to your MD appointments? daughter   Who picks up your prescriptions? daughter   Dressing Independent   Grooming Independent   Feeding Independent   Bathing Independent   Toileting Independent   Name of Prior Assisted Living Facility na   Prior SNF admission? (Detail) na   Prior Rehab admission? (Detail) na   Adult Protective Services (APS) involved? No   Discharge Planning   Support Systems Children   Patient expects to be discharged to: home   Anticipated Dooms plan discussed with: Same as interviewed   Potential barriers to discharge: Testing/procedure   Mode of transportation: Private car (family member)   Consults/Providers   PT Evaluation Needed 1  OT Evalulation Needed 1   SLP Evaluation Needed 2   Outcome Palliative Care Screen Screened but did not meet criteria for intervention   Correct PCP listed in Epic? Yes   PCP   PCP on file was verified as the current PCP? Yes   Important Message from [facility] Notice   Patient received 1st IMM Letter? Yes       Debbe Bales RN, BSN  Clinical Case Manager  [facility]  [phone]

## 2018-01-08 NOTE — Progress Notes (Signed)
[facility]  PROGRESS NOTE  [facility] call: [phone], [phone]  [facility] call: [phone]  After hours call [phone]    Date Time: 01/08/18 4:22 PM  Patient Name: Spiers,[name]  Requesting Physician: Amanda Cockayne, MD      Chief Complaint:   Acute on chronic anemia  Dark stool    Assessment and Plan:   Assessment:  1. Dark stool. Associated with drop in H/H. Pt w known hx of recurrent GI bleeding exacerbated in setting of asa and Plavix. Extensive GI workup in the past, most recently 11/2017 revealing multiple AVMs that were cauterized. Suspect recurrent bleeding from further AVMs  2. Acute on chronic anemia, suspect from above  3. CAD s/p DES (02/22/17) on Plavix and aspirin, last dose 10/17  4. A fib on amiodarone   5. Abnormal imaging - possible bone metastases incidentally noted on CT chest    Plan:  1. Continue to trend H/H, transfuse prn.  2. Monitor for further overt GI bleeding.  3. Clear liquid diet ok from GI perspective.  4. Patient has not had any overt GI bleeding since holding Plavix. Would recommend discussion with cardiology about risk vs benefit of Plavix in hx of GI bleed.  5. As there is no overt GI bleeding will hold off on endoscopic procedures at this time. We will re-consider should bleeding occur with drop in H/H. Patient would need cardiac risk assessment should GI procedures be needed.  6. Further workup per primary team.    Subjective:   Patient reports feeling well. No BM since admission. No abdominal pain, nausea, or vomiting. He has been NPO and reports being hungry.    Medications:     Current Facility-Administered Medications   Medication Dose Route Frequency   . insulin lispro  1-5 Units Subcutaneous Q4H [facility]       Review of Systems:   General:  Patient denies lack of appetite, night sweats, weight loss, fatigue, fever.   HEENT:  Patient denies headache, hoarseness   Cardiovascular:  Patient denies swelling of hands/feet, fainting/blacking out, chest pain.    Respiratory:  Patient denies chronic cough, difficulty breathing, wheezing.   Genitourinary:  Patient denies blood in urine, dark urine  Musculoskeletal: Patient denies joint pain, joint stiffness, joint swelling.   Skin:  Patient denies itching, rash.   Neurologic:  Patient denies dizziness, loss of consciousness, fainting, confusion  Heme/Lymphatic:  Patient denies easy bruising.       Pertinent positives noted in HPI.    Physical Exam:     Vitals:    01/08/18 1300   BP: 103/51   Pulse: (!) 47   Resp: (!) 36   Temp:    SpO2: 94%     General appearance: Elderly, thin, appears stated age and in NAD  Eyes: Sclera anicteric, pink conjunctivae, no ptosis  ENMT: mucous membranes moist, nose and ears appear normal.  Oropharynx clear.  Chest: Non labored respirations, no audible wheezing, no clubbing or cyanosis  CV:  Regular rate and rhythm, no JVD, no LE edema  Abdomen: non-tender, non-distended,no masses or organomegaly  Skin: +Pallor, no rashes, no suspicious skin lesions noted  Neuro: CN II-XII grossly intact.  No gross movement disorders noted.  Mental status: Appropriate affect, alert and oriented x 3      Labs:     Recent Labs     01/08/18  1022 01/08/18  0520   WBC 6.50 6.41   Hgb 7.9* 7.9*   Hematocrit  26.0* 25.7*   Platelets 250 264   MCV 97.7* 97.0*       Recent Labs     01/08/18  0520 01/07/18  1700 01/07/18  1103   Sodium 144 141 141   Potassium 4.7 4.2 4.7   Chloride 118* 115* 113*   CO2 17* 17* 19*   BUN 68.0* 68.0* 75.0*   Creatinine 2.1* 1.8* 2.1*   Glucose 97 90 102*   Calcium 8.0 7.4* 8.3   Magnesium 2.5 2.3 2.5   Phosphorus  --   --  3.4       Recent Labs     01/07/18  1103   AST (SGOT) 12   ALT 15   Alkaline Phosphatase 131*   Bilirubin, Total 0.3   Protein, Total 5.5*   Albumin 2.9*       Recent Labs     01/07/18  1103   PTT 27   PT 14.9   PT INR 1.2*        Radiology:   Radiological Procedure reviewed:    CT Chest wo Con 01/07/18:  1. Multiple bilateral pulmonary nodules measuring up to 7 mm in  diameter  2. Sclerotic bony abnormality in mid thoracic spine, posterior ribs, and  sternum suspicious for metastatic disease    Endoscopy:     EGD 12/08/17: multiple AVMS in stomach + APC, AVMs in duodenum +APC    Colonscopy 12/08/17: 2 AVMs in descending +APC, int hemorrhoids    Colon 02/11/17:hemorrhoids, few large patchy angioectasias with bleeding in cecum (+coag with argon plasma)    EGD 02/11/17:few non-bleeding angioectasias in stomach (+argon beam coag), few angioectasias in duodenum and D2 (+ABC)    Colonoscopy 04/03/12:Angioectasia/AVM found in the ascending colon. Argon beam coagulation was applied to control bleeding. Hemorrhoids found.    EGD 04/03/12:Areas of angioectasia/AVM in the body of the stomach. Argon beam coagulation was applied to control bleeding. An ulcer was found in the prepyloric region. Areas of angioectasia/AVM were found in the duodenal bulb and 2nd portion of the duodenum.    Colonoscopy 07/21/11:Angioectasia/AVM found in the cecum, ascending colon and distal transverse colon. Argon beam coagulation used to control bleeding. Severe diverticulosis in the sigmoid colon. Internal grade II hemorrhoids.     EGD 07/21/11:Multiple AVMs in the hiatal hernia treated with APC cautery. Areas of angioectasia/AVM were found in the body of the stomach and cardia. Argon beam coagulation was applied to control bleeding. Areas of angioectasia/AVM were found in the 2nd portion of the duodenum and 3rd portion of the duodenum. Argon beam coagulation was applied to control bleeding.

## 2018-01-08 NOTE — Plan of Care (Signed)
Problem: Safety  Goal: Patient will be free from injury during hospitalization  Outcome: Progressing  Goal: Patient will be free from infection during hospitalization  Outcome: Progressing     Problem: Pain  Goal: Pain at adequate level as identified by patient  Outcome: Progressing     Problem: Side Effects from Pain Analgesia  Goal: Patient will experience minimal side effects of analgesic therapy  Outcome: Progressing     Problem: Discharge Barriers  Goal: Patient will be discharged home or other facility with appropriate resources  Outcome: Progressing     Problem: Psychosocial and Spiritual Needs  Goal: Demonstrates ability to cope with hospitalization/illness  Outcome: Progressing     Problem: Moderate/High Fall Risk Score >5  Goal: Patient will remain free of falls  Outcome: Progressing     Problem: Compromised Tissue integrity  Goal: Damaged tissue is healing and protected  Outcome: Progressing  Goal: Nutritional status is improving  Outcome: Progressing     Problem: Hemodynamic Status: Cardiac  Goal: Stable vital signs and fluid balance  Outcome: Progressing     Problem: Inadequate Tissue Perfusion  Goal: Adequate tissue perfusion will be maintained  Outcome: Progressing     Problem: Altered GI Function  Goal: Fluid and electrolyte balance are achieved/maintained  Outcome: Progressing  Goal: Elimination patterns are normal or improving  Outcome: Progressing  Goal: Nutritional intake is adequate  Outcome: Progressing  Goal: Mobility/Activity is maintained at optimal level for patient  Outcome: Progressing  Goal: No bleeding  Outcome: Progressing     Pt. AXO, MAE, no complaints of pain, neurologically intact. Pt. Is on room air, hemodynamically stable. After third U prbc patient hemoglobin up to 8.1, still trending cbc/h&h q6. Blood pressures are soft, but maps are greater than 65. Patient is afebrile. Patient remains on protonix gtt, as well as octreotide. Scattered abrasions, skin tears from frequent  falls at home (see flowsheets). Voids per urinal, no BM this shift, no blood in urine or stool, no hematemesis. Will continue to monitor closely for patient safety.

## 2018-01-08 NOTE — UM Notes (Incomplete)
01/07/18 1223  Admit to Inpatient (ADULT INPATIENT ADMIT PANEL ) Once         Adm to [facility] DEPT 01/07/2018 1021  Transfer to [place] TOWER 4

## 2018-01-08 NOTE — Transfer Summary (Signed)
ICU Transfer Summary    Spoke with Dr. Tyson Alias, who accepted the transfer at 1:00pm     In summary, [name] is a 84 year old male with hx of anemia, melena, CAD s/p PTCA on 02/21/17, afib, htn, sle, UC, angioectasisas of stomach and duodenem whowas recently here on 12/03/17 to 12/12/17 for anemia and GI bleeding s/p ablation who presented to the ED due to dizziness and weakness with a hgb of 4.7 and hypotensive now s/p 3 units of pRBCs and hemodynamically stable without signs of bleeding.     Physical Exam:  Vitals:    01/08/18 1200   BP: 123/57   Pulse: (!) 56   Resp: (!) 31   Temp:    SpO2: 98%     Physical Exam   Constitutional: He is oriented to person, place, and time and well-developed, well-nourished, and in no distress. No distress.   HENT:   Head: Normocephalic and atraumatic.   Neck: Normal range of motion.   Cardiovascular: Normal rate, regular rhythm, normal heart sounds and intact distal pulses. Exam reveals no gallop and no friction rub.   No murmur heard.  Pulmonary/Chest: Effort normal and breath sounds normal. No respiratory distress. He has no wheezes. He has no rales. He exhibits no tenderness.   Abdominal: Soft. Bowel sounds are normal. He exhibits no distension and no mass. There is no tenderness. There is no rebound and no guarding.   Musculoskeletal: Normal range of motion.         General: No tenderness or edema.   Neurological: He is alert and oriented to person, place, and time.   Skin: He is not diaphoretic.   Vitals reviewed.       ICU Course:  Neuro:  NAI  Plan:  - continue to monitor     Cardio:  #Hx of Afib  #CAD  #Hx of HTN  #Hypovolumic shock  Patient was hypotensive in the ED and has received a total of 3 units of pRBCs overnight and a 500cc NS bolus. Currently hemodynamically stable. Home antihypertensive medications were held and was not given home plavix or amiodarone.  Plan:  -  Continue to monitor vitals   - Cardiology consulted regarding whether patient needs to continue plavix  in the setting of GI bleeding. F/u recs     Resp:  #NAI  Plan:  - continue to monitor     Renal:  #AKI  #Anion Gap Metabolic Acidosis   Baseline Cr appears to be 1.2 and has risen to 2.1. AG was 17 today and patient has LA of 1.2 most likely secondary to hypovolumic shock.   Plan:  - continue to monitor with qAM BMP  - goal is to be net positive in regards to I's and O's because patient is hypovolumic    GI:  #Angioectasias of the stomach and duodenum   #GI  Bleeding  Patient has hx of recent GI bleed that he was hospitalized for from 12/03/17 to 12/12/17 in which an EGD was done and they were ablated. Patient presented to the ED with hx of melenic stools possibly confounded by taking iron supplements, however, had an hgb of 4.7 and was hypotensive with multiple recent falls due to dizziness. He was transfused 3 units of pRBCs and hgb rose to 8.1 and is currently 7.9. The patient is hemodynamically stable.   Plan:  - GI consulted, will continue to follow.   - continue to trend H&H   - continue clear liquid  diet   - continue to hold plavix  - cardiology consulted regarding patient continue plavix in the hx of GI bleed  - GI will reassess need for EGD due to patient currently not having signs of overt bleeding.       ID:  NAI  Plan:  -  Continue to monitor clinically     Heme/Onc:  #Anemia  #Pulmonary Nodules with bony metastasis   Patient admitted with a hgb of 4.7 and went up to 8.1 s/p 3 units of pRBCs due to GI bleeding in the setting of plavix use. CT chest on 10/19 showed bilateral pulmonary nodules with sclerotic bony abnormality in mid thoracic spine, posterior ribs, and sternum suspicious for metastatic disease.    Plan:  - transfuse prn for hgb <7  - consult hematology/ oncology regarding pulmonary nodules and bony metastasis so patient can follow up outpatient regarding results.     Endo:  Plan:  - blood sugar goals of 120 to 180    Code Status: NO CPR - SUPPORT OK    Cherlyn Cushing, MD  PGY-1  Resident  01/08/18 12:18 PM

## 2018-01-08 NOTE — UM Notes (Addendum)
01/07/18 1223    Admit to Inpatient     84 y.o. 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  The patient was recently admitted from 12/03/17-12/12/17 for anemia and GI bleed.  EGD at the time showed many angioectasias of the stomach and duodenum some of which were actively oozing and ablated.  He also had epistaxis which required packing by ENT during that admission.  After admission, he restarted plavix and was doing well up until the last week.  He has been progressively more dizzy and weak and has fallen several times due to his legs buckling.     Initial VS: 97.6, 65, 20,  90% sats,  100/47.      Critical Care services by organ system  NEURO:   NAI    CV:   Check LA  Monitor BP with transfusions  Hold statin, lisinopril, amiodarone  Blood pressure goal: MAP > 65 mmHg    PULM: NAI    GI:   GI consulted  octreotide  GI PPX: PPI  Nutrition: NPO    RENAL:   I/O Goal: positive   Monitor cr, UO  Renally dose meds     ID:   NAI    HEME:   Supportive blood products including platelets  SCD's  Pharmacologic ppx: not safe secondary to GI bleed  Consider OP work up for malignancy given CT chest    ENDO/RHEUM:   Glucose: goal 100 -180    Code Status: DNR/I. This was discussed with the patient and the patient's daughter at the bedside.    ==============================================================    01/07/2018 12:14 PM - Interface, Lab In [facility]     Component Value Ref Range & Units Status   WBC 8.54  3.10 - 9.50 x10 3/uL Final   Hgb 4.7Low Panic   12.5 - 17.1 g/dL Final   This result has been called to [id number] by WEED STEPHANIE on 10 19 2019   at 1125, and has been read back.    Hematocrit 17.0Low   37.6 - 49.6 % Final   Platelets 389High   142 - 346 x10 3/uL Final   RBC 1.55Low   4.20 - 5.90 x10 6/uL Final   MCV 109.7High   78.0 - 96.0 fL Final   MCH 30.3  25.1 - 33.5 pg Final   MCHC 27.6Low    31.5 - 35.8 g/dL Final   RDW 04VWUJ   11 - 15 % Final     10/20.  Most recent VS:  97.7, 53, 25,  104/51.  97% sats.      01/08/2018 4:33 PM - Interface, Lab In [facility]     Component Value Ref Range & Units Status   WBC 6.66  3.10 - 9.50 x10 3/uL Final   Hgb 7.4Low   12.5 - 17.1 g/dL Final   Hematocrit 81.1BJY   37.6 - 49.6 % Final   Platelets 251  142 - 346 x10 3/uL Final   RBC 2.52Low   4.20 - 5.90 x10 6/uL Final     now s/p 3 units of pRBCs and hemodynamically stable without signs of bleeding.     VS over the last 24 hrs:  Temp:  [97.7 F (36.5 C)-98 F (36.7 C)] 97.7 F (36.5 C)  Heart Rate:  [47-99] 53  Resp Rate:  [18-41] 25  BP: (94-123)/(47-64) 104/51

## 2018-01-09 ENCOUNTER — Inpatient Hospital Stay: Payer: Medicare Other

## 2018-01-09 ENCOUNTER — Other Ambulatory Visit: Payer: 59

## 2018-01-09 LAB — IRON PROFILE
Iron Saturation: 6 % — ABNORMAL LOW (ref 15–50)
Iron: 15 ug/dL — ABNORMAL LOW (ref 40–160)
TIBC: 267 ug/dL (ref 261–462)
UIBC: 252 ug/dL (ref 126–382)

## 2018-01-09 LAB — MAGNESIUM: Magnesium: 2.5 mg/dL (ref 1.6–2.6)

## 2018-01-09 LAB — HEMOLYSIS INDEX
Hemolysis Index: 30 — ABNORMAL HIGH (ref 0–18)
Hemolysis Index: 14 (ref 0–18)

## 2018-01-09 LAB — CBC
Absolute NRBC: 0 10*3/uL (ref 0.00–0.00)
Absolute NRBC: 0.02 10*3/uL — ABNORMAL HIGH (ref 0.00–0.00)
Hematocrit: 25.6 % — ABNORMAL LOW (ref 37.6–49.6)
Hematocrit: 28 % — ABNORMAL LOW (ref 37.6–49.6)
Hgb: 7.7 g/dL — ABNORMAL LOW (ref 12.5–17.1)
Hgb: 8.5 g/dL — ABNORMAL LOW (ref 12.5–17.1)
MCH: 29.5 pg (ref 25.1–33.5)
MCH: 29.6 pg (ref 25.1–33.5)
MCHC: 30.1 g/dL — ABNORMAL LOW (ref 31.5–35.8)
MCHC: 30.4 g/dL — ABNORMAL LOW (ref 31.5–35.8)
MCV: 98.1 fL — ABNORMAL HIGH (ref 78.0–96.0)
MCV: 97.6 fL — ABNORMAL HIGH (ref 78.0–96.0)
MPV: 10.1 fL (ref 8.9–12.5)
MPV: 10.1 fL (ref 8.9–12.5)
Nucleated RBC: 0 /100 WBC (ref 0.0–0.0)
Nucleated RBC: 0.2 /100 WBC — ABNORMAL HIGH (ref 0.0–0.0)
Platelets: 254 10*3/uL (ref 142–346)
Platelets: 274 10*3/uL (ref 142–346)
RBC: 2.61 10*6/uL — ABNORMAL LOW (ref 4.20–5.90)
RBC: 2.87 10*6/uL — ABNORMAL LOW (ref 4.20–5.90)
RDW: 19 % — ABNORMAL HIGH (ref 11–15)
RDW: 19 % — ABNORMAL HIGH (ref 11–15)
WBC: 8.26 10*3/uL (ref 3.10–9.50)
WBC: 8.5 10*3/uL (ref 3.10–9.50)

## 2018-01-09 LAB — GLUCOSE WHOLE BLOOD - POCT
Whole Blood Glucose POCT: 99 mg/dL (ref 70–100)
Whole Blood Glucose POCT: 96 mg/dL (ref 70–100)
Whole Blood Glucose POCT: 124 mg/dL — ABNORMAL HIGH (ref 70–100)

## 2018-01-09 LAB — FERRITIN: Ferritin: 34.92 ng/mL (ref 21.80–274.70)

## 2018-01-09 LAB — FOLATE: Folate: 6 ng/mL

## 2018-01-09 LAB — GFR: EGFR: 38.2

## 2018-01-09 LAB — BASIC METABOLIC PANEL
BUN: 48 mg/dL — ABNORMAL HIGH (ref 9.0–28.0)
CO2: 20 mEq/L — ABNORMAL LOW (ref 22–29)
Calcium: 7.7 mg/dL — ABNORMAL LOW (ref 7.9–10.2)
Chloride: 115 mEq/L — ABNORMAL HIGH (ref 100–111)
Creatinine: 1.7 mg/dL — ABNORMAL HIGH (ref 0.7–1.3)
Glucose: 96 mg/dL (ref 70–100)
Potassium: 4.6 mEq/L (ref 3.5–5.1)
Sodium: 139 mEq/L (ref 136–145)

## 2018-01-09 LAB — RETICULOCYTES
Immature Retic Fract: 17.7 % — ABNORMAL HIGH (ref 1.2–15.6)
Reticulocyte Count Absolute: 0.1925 10*6/uL — ABNORMAL HIGH (ref 0.0220–0.1420)
Reticulocyte Count Automated: 6.4 % — ABNORMAL HIGH (ref 0.8–2.3)
Reticulocyte Hemoglobin: 21.4 pg — ABNORMAL LOW (ref 28.4–40.2)

## 2018-01-09 MED ORDER — POTASSIUM CHLORIDE IN NACL 20-0.9 MEQ/L-% IV SOLN
INTRAVENOUS | Status: DC
Start: 2018-01-09 — End: 2018-01-13
  Administered 2018-01-09 – 2018-01-13 (×6): 75 mL/h via INTRAVENOUS

## 2018-01-09 MED ORDER — ATORVASTATIN CALCIUM 80 MG PO TABS
80.00 mg | ORAL_TABLET | Freq: Every evening | ORAL | Status: DC
Start: 2018-01-09 — End: 2018-01-15
  Administered 2018-01-09 – 2018-01-14 (×6): 80 mg via ORAL
  Filled 2018-01-09 (×6): qty 1

## 2018-01-09 NOTE — Consults (Signed)
CONSULTATION    Date Time: 01/09/18 2:07 PM  Patient Name: [name],[name]  Requesting Physician: Amanda Cockayne, MD      Reason for Consultation:   Lung nodule  Assessment:   1.Lung nodule  2.Anemia:,with  longstanding history mucosal angioectasia/ AVM  3.Bone lesion  4.CAD S/P PCI   5.Afib: AC on hold due to bleed   6.HTN  7.HLD: ON Statin  Plan:   -Lung Nodules too small for Bx  -Will send for CT A/P and Bone scan in face of Sclerotic bony abnormality in mid thoracic spine, posterior ribs, and  Sternum  -Will send for Ferritin and ESR,   -Further mgm based on above WU   Thank you for consultation will cont to FU with you   History:   [name] is a 84 y.o. male who presents to the hospital on 01/07/2018 with weakness and dizziness, The patient was recently admitted from 12/03/17-12/12/17 for anemia and GI bleed.  EGD at the time showed many angioectasias of the stomach and duodenum some of which were actively oozing and ablated.  He also had epistaxis which required packing by ENT during that admission.  After admission, he restarted plavix and was doing well up until the last week.  He has been progressively more dizzy and weak and has fallen several times due to his legs buckling. After the fall had some CP for which had CT of chest done revealing Pulm nodule with abnl bony lesion and oncology was consulted for further mgm , (+) wt loss, no HA or blurry vision and no other GI or GU symptoms     Past Medical History:     Past Medical History:   Diagnosis Date   . Anemia     iron low   . Black tarry stools 2019   . CKD (chronic kidney disease), stage III    . Coronary artery disease    . DOE (dyspnea on exertion)     mild   . Encounter for blood transfusion    . Glaucoma NEC    . Hyperlipidemia    . Hypertensive disorder    . Macrocytosis    . Paroxysmal A-fib    . Skin cancer     2006 removed mole   . SLE (systemic lupus erythematosus) 1990   . Ulcerative colitis, chronic    . Vision abnormalities        Past  Surgical History:     Past Surgical History:   Procedure Laterality Date   . CARDIAC SURGERY      3 stents   . EGD, COLONOSCOPY  04/03/2012    Procedure: EGD, COLONOSCOPY;  Surgeon: Lestine Mount, MD;  Location: [id number];  Service: Gastroenterology;  Laterality: N/A;   . EGD, COLONOSCOPY N/A 02/11/2017    Procedure: EGD, COLONOSCOPY;  Surgeon: Lestine Mount, MD;  Location: [id number];  Service: Gastroenterology;  Laterality: N/A;   . EGD, COLONOSCOPY N/A 12/08/2017    Procedure: EGD, COLONOSCOPY;  Surgeon: Jacqulyn Cane, MD;  Location: [id number];  Service: Gastroenterology;  Laterality: N/A;   . EYE SURGERY      cataract       Family History:   History reviewed. No pertinent family history.    Social History:     Social History     Socioeconomic History   . Marital status: Married     Spouse name: Not on file   . Number of children: Not on file   .  Years of education: Not on file   . Highest education level: Not on file   Occupational History   . Not on file   Social Needs   . Financial resource strain: Not on file   . Food insecurity:     Worry: Not on file     Inability: Not on file   . Transportation needs:     Medical: Not on file     Non-medical: Not on file   Tobacco Use   . Smoking status: Former Smoker     Packs/day: 1.00     Years: 12.00     Pack years: 12.00     Types: Cigarettes     Last attempt to quit: 12/18/1961     Years since quitting: 56.0   . Smokeless tobacco: Never Used   Substance and Sexual Activity   . Alcohol use: Yes     Alcohol/week: 0.0 standard drinks     Comment: 0.5oz/d   . Drug use: No   . Sexual activity: Not on file   Lifestyle   . Physical activity:     Days per week: Not on file     Minutes per session: Not on file   . Stress: Not on file   Relationships   . Social connections:     Talks on phone: Not on file     Gets together: Not on file     Attends religious service: Not on file     Active member of club or organization: Not on file     Attends meetings of clubs  or organizations: Not on file     Relationship status: Not on file   . Intimate partner violence:     Fear of current or ex partner: Not on file     Emotionally abused: Not on file     Physically abused: Not on file     Forced sexual activity: Not on file   Other Topics Concern   . Not on file   Social History Narrative   . Not on file       Allergies:   No Known Allergies    Medications:     Current Facility-Administered Medications   Medication Dose Route Frequency   . atorvastatin  80 mg Oral QHS   . insulin lispro  1-5 Units Subcutaneous Q4H [facility]       Review of Systems:   A comprehensive review of systems was: Ten point review of systems negative or as per HPI and below endorsements.    Physical Exam:     Vitals:    01/09/18 1200   BP: 113/56   Pulse: (!) 59   Resp: (!) 26   Temp: 97.4 F (36.3 C)   SpO2: 99%       Intake and Output Summary (Last 24 hours) at Date Time    Intake/Output Summary (Last 24 hours) at 01/09/2018 1407  Last data filed at 01/09/2018 1134  Gross per 24 hour   Intake 510 ml   Output 1050 ml   Net -540 ml       General appearance - alert, well appearing, and in no distress  Mental status - alert, oriented to person, place, and time  Eyes - pupils equal and reactive, extraocular eye movements intact  Ears - bilateral TM's and external ear canals normal  Nose - normal and patent, no erythema, discharge or polyps  Mouth - mucous membranes moist, pharynx normal without lesions  Neck - supple, no significant adenopathy  Chest - clear to auscultation, no wheezes, rales or rhonchi, symmetric air entry  Heart - normal rate, regular rhythm, normal S1, S2, no murmurs, rubs, clicks or gallops  Abdomen - soft, nontender, nondistended, no masses or organomegaly  Neurological - alert, oriented, normal speech, no focal findings or movement disorder noted  Musculoskeletal - no joint tenderness, deformity or swelling  Extremities - peripheral pulses normal, no pedal edema, no clubbing or cyanosis  Skin -  normal coloration and turgor, no rashes, no suspicious skin lesions noted    Labs Reviewed:     Results     Procedure Component Value Units Date/Time    Glucose Whole Blood - POCT [[id number]]  (Abnormal) Collected:  01/09/18 1151     Updated:  01/09/18 1203     POCT - Glucose Whole blood 124 mg/dL     Glucose Whole Blood - POCT [[id number]] Collected:  01/09/18 0728     Updated:  01/09/18 0734     POCT - Glucose Whole blood 96 mg/dL     IRON PROFILE [[id number]]  (Abnormal) Collected:  01/09/18 0333     Updated:  01/09/18 0721     Iron 15 ug/dL      UIBC 086 ug/dL      TIBC 578 ug/dL      Iron Saturation 6 %     Hemolysis index [[id number]]  (Abnormal) Collected:  01/09/18 0333     Updated:  01/09/18 0710     Hemolysis Index 30    Glucose Whole Blood - POCT [[id number]]  (Abnormal) Collected:  01/08/18 1932     Updated:  01/09/18 0529     POCT - Glucose Whole blood 107 mg/dL     GFR [[id number]] Collected:  01/09/18 0333     Updated:  01/09/18 0424     EGFR 38.2    Narrative:       Start to draw 1 hour after current blood transfusion finishes    Magnesium [[id number]] Collected:  01/09/18 0333    Specimen:  Blood Updated:  01/09/18 0424     Magnesium 2.5 mg/dL     Narrative:       Start to draw 1 hour after current blood transfusion finishes    Basic Metabolic Panel [[id number]]  (Abnormal) Collected:  01/09/18 0333    Specimen:  Blood Updated:  01/09/18 0424     Glucose 96 mg/dL      BUN 29.5 mg/dL      Creatinine 1.7 mg/dL      Calcium 7.7 mg/dL      Sodium 188 mEq/L      Potassium 4.6 mEq/L      Chloride 115 mEq/L      CO2 20 mEq/L     Narrative:       Start to draw 1 hour after current blood transfusion finishes    CBC without differential [[id number]]  (Abnormal) Collected:  01/09/18 0333    Specimen:  Blood Updated:  01/09/18 0407     WBC 8.26 x10 3/uL      Hgb 7.7 g/dL      Hematocrit 60.1 %      Platelets 254 x10 3/uL      RBC 2.61 x10 6/uL      MCV 98.1 fL      MCH 29.5 pg      MCHC 30.1 g/dL      RDW 19 %      MPV  10.1 fL      Nucleated RBC 0.0 /100 WBC      Absolute NRBC 0.00 x10 3/uL     Narrative:       Start to draw 1 hour after current blood transfusion finishes    Glucose Whole Blood - POCT [[id number]] Collected:  01/09/18 0333     Updated:  01/09/18 0337     POCT - Glucose Whole blood 99 mg/dL     Glucose Whole Blood - POCT [[id number]] Collected:  01/08/18 2326     Updated:  01/08/18 2330     POCT - Glucose Whole blood 97 mg/dL     Glucose Whole Blood - POCT [[id number]]  (Abnormal) Collected:  01/08/18 2010     Updated:  01/08/18 2201     POCT - Glucose Whole blood 114 mg/dL     CBC without differential [[id number]]  (Abnormal) Collected:  01/08/18 1619    Specimen:  Blood Updated:  01/08/18 1633     WBC 6.66 x10 3/uL      Hgb 7.4 g/dL      Hematocrit 62.9 %      Platelets 251 x10 3/uL      RBC 2.52 x10 6/uL      MCV 96.0 fL      MCH 29.4 pg      MCHC 30.6 g/dL      RDW 20 %      MPV 9.9 fL      Nucleated RBC 0.0 /100 WBC      Absolute NRBC 0.00 x10 3/uL     Narrative:       Start to draw 1 hour after current blood transfusion finishes    MRSA culture - Throat (If not done in triage) [[id number]] Collected:  01/07/18 1700    Specimen:  Body Fluid from Throat Updated:  01/08/18 1619     Culture MRSA Surveillance Negative for Methicillin Resistant Staph aureus    MRSA culture - Nares (If not done in triage) [[id number]] Collected:  01/07/18 1700    Specimen:  Body Fluid from Nares Updated:  01/08/18 1619     Culture MRSA Surveillance Negative for Methicillin Resistant Staph aureus    Glucose Whole Blood - POCT [[id number]]  (Abnormal) Collected:  01/08/18 1543     Updated:  01/08/18 1550     POCT - Glucose Whole blood 110 mg/dL               Rads:   No results found.  Radiological Procedure reviewed.     Signed by: Jakorian Bellow

## 2018-01-09 NOTE — Progress Notes (Signed)
[facility] Progress Note  NP/PA [facility] [phone] and [phone]  Patient of Dr. Tarry Kos     Patient: [name]   Date of Service: 01/09/2018    Assessment and Plan:   Acute on chronic anemia, longstanding history mucosal angioectasia/ AVM  He initially presented with dark stools and was found to have H/H significantly depressed at 4.7/17. He has received 3 units PRBCs with improvement in H/H on 9/19, which is stable, GI suspects AVMs exacerbated by plavix.  EGD/ [facility] with multiple AVMs with cauterizing 12/08/2017  - His last stent was placed in 02/2017 so given it has been over 10 months since the last stent, it is reasonable to stop plavix at this time as the risks of bleeding outweigh the benefits.  - GI following    Pre-operative cardiac risk stratification  - He denies any symptoms concerning for angina  - He is low-moderate risk for any endoscopic procedures if needed given hx of CAD and CHF    Two-vessel obstructive CAD s/p PCI/DES to LM/LCx on 02/22/17  - No evidence of active ischemia at this time. Angina free  - Resume statin  - Plavix can be stopped as above  - Consider starting aspirin, will discuss more with patient tomorrow if he is more open, if not will try to reach out to his daughter tomorrow as she did not pick up phone today.    Paroxysmal Atrial Fibrillation (CHADS2VASc 5)  - Currently in SR with PACs  - On oral amiodarone 100mg  PO QD to maintain NSR as outpatient  - Not on AC secondary to recurrent bleeding; therefore high risk of bleeding as noted above  - Consider Watchman as outpatient, was discussed during prior outpatient visit.    Acute HFrEF 36%(via echo done 02/11/2017)  - Appears compensated on exam  - Optimize GDMT: Resume Lisinopril 5mg  daily as renal function improves  - Would hold BB due to borderline bradycardia  - Repeat echo as outpatient to reassess LV function    ModeratePulmonary HTN(54mmHg via echo done 01/2017)  - Follow prospectively as  outpatient    HTN  - Stable    HLD  - On statin    AKI  - Cr elevated now 1.7    Thank you for this consultation. We will follow with you.      Patient Active Problem List    Diagnosis Date Noted   . Iron deficiency anemia secondary to inadequate dietary iron intake [D50.8] 02/22/2017   . Anemia in other chronic diseases classified elsewhere [D63.8] 02/22/2017   . HGI bleed [K92.2] 02/09/2017   . Anemia [D64.9] 02/09/2017       Subjective:     Denies chest pain, SOB, palpitations.  Patient upset at this time and repeats that he's here for his GI bleeding and not his heart.    Prior Cardiac History/Testing:     LHC 02/2017:  Conclusions  1. There is severe coronary artery disease.  2. Proximal Circumflex Coronary Artery was treated with three Balloon and  Drug Eluting Stent.  3. Mid Circumflex Coronary Artery was treated with Balloon and Drug Eluting  Stent.  4. Successful CTO intervention of the left circumflex artery.    Recommendations  * Continue current medical management and risk factor modification.    Diagnostic Findings  * Coronary angiography shows right dominance.  * The LM has no significant atherosclerosis.  * The LAD has no significant atherosclerosis.  * The RCA has no  significant atherosclerosis.  * Proximal Circumflex Coronary Artery: Severe 100% stenosis, No dissection.  * Mid Circumflex Coronary Artery: Severe 100% stenosis.    Echo 01/2017:  Summary  * The left ventricle is normal in size.  * Left ventricular systolic function is moderately decreased with an  ejection fraction by Biplane Method of Discs of 36 %.  * Left Ventricular diastolic filling parameters are consistent with Grade II  diastolic dysfunction (pseudonormal pattern).  * Right ventricular size and systolic function are normal.  * Mild bi-atrial enlargement.  * Moderate to severe mitral valve regurgitation.  * There is mild tricuspid regurgitation.  * Moderate pulmonary hypertension  with estimated right ventricular systolic  pressure of 63.29 mmHg.      Physical Exam:     Vitals:    01/09/18 0900   BP: 122/61   Pulse: 62   Resp: 19   Temp:    SpO2: 99%     Temp (24hrs), Avg:97.7 F (36.5 C), Min:97.5 F (36.4 C), Max:98 F (36.7 C)    Weight: 53.5 kg (117 lb 15.1 oz)    Intake and Output Summary (Last 24 hours) at Date Time    Intake/Output Summary (Last 24 hours) at 01/09/2018 1020  Last data filed at 01/09/2018 0700  Gross per 24 hour   Intake 470 ml   Output 700 ml   Net -230 ml       Patient deferred physical exam    Medications:       Current Facility-Administered Medications   Medication Dose Route Frequency   . insulin lispro  1-5 Units Subcutaneous Q4H [facility]       Labs:     Recent CMP   Recent Labs     01/09/18  0333   Glucose 96   BUN 48.0*   Creatinine 1.7*   Sodium 139   Potassium 4.6   CO2 20*   Calcium 7.7*     Recent CARDIAC ENZYMES No results for input(s): TROPONIN, ISTATTROPONI, CK in the last 24 hours.    Invalid input(s): TROPONINT, CKMB[24  Recent TSH Invalid input(s): TSH[24  Recent PT/PTT No results for input(s): INR, PTT in the last 24 hours.    Invalid input(s): PTI, COUM, COUMP, ACOAG, ACOAP[24  Recent CBC WITH DIFF   Recent Labs     01/09/18  0333   RBC 2.61*   Hgb 7.7*   Hematocrit 25.6*   MCV 98.1*   MCHC 30.1*   RDW 19*   MPV 10.1   Platelets 254     Recent LIPID PANEL No results for input(s): CHOL, TRIG, HDL in the last 24 hours.    Invalid input(s): LDLC, VLDLC, LRAT[24    Rads:     Radiology Results (24 Hour)     ** No results found for the last 24 hours. **          Cardiographics:     Telemetry:    45-63 NSR, PACs

## 2018-01-09 NOTE — Progress Notes (Signed)
[name] is a 84 y.o. male patient.  Active Problems:    GI bleed  [name] is a 84 y.o. 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  The patient was recently admitted from 12/03/17-12/12/17 for anemia and GI bleed.  EGD at the time showed many angioectasias of the stomach and duodenum some of which were actively oozing and ablated.  He also had epistaxis which required packing by ENT during that admission.  After admission, he restarted plavix and was doing well up until the last week.  He has been progressively more dizzy and weak and has fallen several times due to his legs buckling.  He says he never lost consciousness, no bowel or bladder incontinence, no tongue biting.  He has noticed dark black stool but is on iron and attributed the dark stools to iron.  He vomited yesterday but no hematemesis.  He denies CP, abdominal pain, SOB.  He has right sided tenderness to palpation due to his fall.  The patient has not taken any medication since Thursday because he hasn't felt well and in fact has stayed in bed for the last few days.   Past Medical History:   Diagnosis Date   . Anemia     iron low   . Black tarry stools 2019   . CKD (chronic kidney disease), stage III    . Coronary artery disease    . DOE (dyspnea on exertion)     mild   . Encounter for blood transfusion    . Glaucoma NEC    . Hyperlipidemia    . Hypertensive disorder    . Macrocytosis    . Paroxysmal A-fib    . Skin cancer     2006 removed mole   . SLE (systemic lupus erythematosus) 1990   . Ulcerative colitis, chronic    . Vision abnormalities      Current Facility-Administered Medications   Medication Dose Route Frequency Provider Last Rate Last Dose   . 0.9%  NaCl infusion   Intravenous PRN Cherlyn Cushing, MD       . 0.9%  NaCl infusion   Intravenous PRN Cherlyn Cushing, MD       . acetaminophen (TYLENOL)  suppository 650 mg  650 mg Rectal Q6H PRN Cherlyn Cushing, MD   650 mg at 01/07/18 1602   . atorvastatin (LIPITOR) tablet 80 mg  80 mg Oral QHS Ng, Jared Lock, [place]       . dextrose (GLUCOSE) 40 % oral gel 15 g of glucose  15 g of glucose Oral PRN Cherlyn Cushing, MD        And   . dextrose 50 % bolus 12.5 g  12.5 g Intravenous PRN Cherlyn Cushing, MD        And   . glucagon (rDNA) (GLUCAGEN) injection 1 mg  1 mg Intramuscular PRN Cherlyn Cushing, MD       . fentaNYL (PF) (SUBLIMAZE) injection 25 mcg  25 mcg Intravenous Q2H PRN Cherlyn Cushing, MD       . ondansetron [facility]) injection 4 mg  4 mg Intravenous Q8H PRN Cherlyn Cushing, MD         No Known Allergies  Blood pressure 117/60, pulse (!) 58, temperature 97.5 F (36.4 C), temperature source Oral, resp. rate 17, weight 53.5 kg (117  lb 15.1 oz), SpO2 100 %.    Subjective  Objective:  General Appearance:  Comfortable and in no acute distress.    Vital signs: (most recent): Blood pressure 117/60, pulse (!) 58, temperature 97.5 F (36.4 C), temperature source Oral, resp. rate 17, weight 53.5 kg (117 lb 15.1 oz), SpO2 100 %.  Vital signs are normal.    Output: Producing urine.    HEENT: Normal HEENT exam.    Lungs:  Normal effort and normal respiratory rate.  Breath sounds clear to auscultation.    Heart: Normal rate.  Regular rhythm.  S1 normal.    Abdomen: Abdomen is soft.  Bowel sounds are normal.   There is no abdominal tenderness.     Extremities: Normal range of motion.    Pulses: Distal pulses are intact.    Neurological: Patient is alert.    Pupils:  Pupils are equal, round, and reactive to light.    Skin:  Warm, dry and pale.      Results     Procedure Component Value Units Date/Time    Hemolysis index [[id number]] Collected:  01/09/18 1523     Updated:  01/09/18 2031     Hemolysis Index 14    Narrative:       Start to draw 1 hour after current blood transfusion finishes    Folate [[id number]] Collected:  01/09/18 1523     Specimen:  Blood Updated:  01/09/18 2025    Narrative:       Start to draw 1 hour after current blood transfusion finishes    Ferritin [[id number]] Collected:  01/09/18 1523    Specimen:  Blood Updated:  01/09/18 2025    Narrative:       Start to draw 1 hour after current blood transfusion finishes    Reticulocytes [[id number]]  (Abnormal) Collected:  01/09/18 1523    Specimen:  Blood Updated:  01/09/18 1820     Reticulocyte Count Automated 6.4 %      Retic Ct Abs 0.1925 x10 6/uL      Immature Retic Fract 17.7 %      Retic Hgb 21.4 pg     Narrative:       Start to draw 1 hour after current blood transfusion finishes    CBC without differential [[id number]]  (Abnormal) Collected:  01/09/18 1523    Specimen:  Blood Updated:  01/09/18 1550     WBC 8.50 x10 3/uL      Hgb 8.5 g/dL      Hematocrit 10.2 %      Platelets 274 x10 3/uL      RBC 2.87 x10 6/uL      MCV 97.6 fL      MCH 29.6 pg      MCHC 30.4 g/dL      RDW 19 %      MPV 10.1 fL      Nucleated RBC 0.2 /100 WBC      Absolute NRBC 0.02 x10 3/uL     Narrative:       Start to draw 1 hour after current blood transfusion finishes    Glucose Whole Blood - POCT [[id number]]  (Abnormal) Collected:  01/09/18 1151     Updated:  01/09/18 1203     POCT - Glucose Whole blood 124 mg/dL     Glucose Whole Blood - POCT [[id number]] Collected:  01/09/18 0728     Updated:  01/09/18 0734     POCT - Glucose Whole blood 96  mg/dL     IRON PROFILE [[id number]]  (Abnormal) Collected:  01/09/18 0333     Updated:  01/09/18 0721     Iron 15 ug/dL      UIBC 478 ug/dL      TIBC 295 ug/dL      Iron Saturation 6 %     Hemolysis index [[id number]]  (Abnormal) Collected:  01/09/18 0333     Updated:  01/09/18 0710     Hemolysis Index 30    Glucose Whole Blood - POCT [[id number]]  (Abnormal) Collected:  01/08/18 1932     Updated:  01/09/18 0529     POCT - Glucose Whole blood 107 mg/dL     GFR [[id number]] Collected:  01/09/18 0333     Updated:  01/09/18 0424     EGFR 38.2    Narrative:       Start to draw 1  hour after current blood transfusion finishes    Magnesium [[id number]] Collected:  01/09/18 0333    Specimen:  Blood Updated:  01/09/18 0424     Magnesium 2.5 mg/dL     Narrative:       Start to draw 1 hour after current blood transfusion finishes    Basic Metabolic Panel [[id number]]  (Abnormal) Collected:  01/09/18 0333    Specimen:  Blood Updated:  01/09/18 0424     Glucose 96 mg/dL      BUN 56.3 mg/dL      Creatinine 1.7 mg/dL      Calcium 7.7 mg/dL      Sodium 875 mEq/L      Potassium 4.6 mEq/L      Chloride 115 mEq/L      CO2 20 mEq/L     Narrative:       Start to draw 1 hour after current blood transfusion finishes    CBC without differential [[id number]]  (Abnormal) Collected:  01/09/18 0333    Specimen:  Blood Updated:  01/09/18 0407     WBC 8.26 x10 3/uL      Hgb 7.7 g/dL      Hematocrit 84.1 %      Platelets 254 x10 3/uL      RBC 2.61 x10 6/uL      MCV 98.1 fL      MCH 29.5 pg      MCHC 30.1 g/dL      RDW 19 %      MPV 10.1 fL      Nucleated RBC 0.0 /100 WBC      Absolute NRBC 0.00 x10 3/uL     Narrative:       Start to draw 1 hour after current blood transfusion finishes    Glucose Whole Blood - POCT [[id number]] Collected:  01/09/18 0333     Updated:  01/09/18 0337     POCT - Glucose Whole blood 99 mg/dL     Glucose Whole Blood - POCT [[id number]] Collected:  01/08/18 2326     Updated:  01/08/18 2330     POCT - Glucose Whole blood 97 mg/dL     Glucose Whole Blood - POCT [[id number]]  (Abnormal) Collected:  01/08/18 2010     Updated:  01/08/18 2201     POCT - Glucose Whole blood 114 mg/dL       Ct Abdomen Pelvis Wo Iv/ Wo Po Contrast    Result Date: 01/09/2018   1. Moderate-sized bilateral pleural effusions with adjacent atelectasis 2. Small left adrenal nodule likely adenoma 3. Multiple  small hepatic cysts 4. No gross malignancy or adenopathy identified on this nonenhanced CT J. Carole Binning, MD 01/09/2018 4:40 PM    Ct Head Without Contrast    Result Date: 01/07/2018  1. No acute intracranial hemorrhage is  detected. 2. There is mild subcortical, deep, and periventricular leukomalacia in both cerebral hemispheres as well as in the infratentorial white matter.   This is suggestive of chronic small vessel ischemic change. Theodoro Doing, MD 01/07/2018 11:36 AM    Ct Chest Without Contrast    Result Date: 01/07/2018  1. Multiple bilateral pulmonary nodules measuring up to 7 mm in diameter 2. Sclerotic bony abnormality in mid thoracic spine, posterior ribs, and sternum suspicious for metastatic disease J. Carole Binning, MD 01/07/2018 11:55 AM    Assessment & Plan     1.  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  Acute on chronic anemia  Iron TIBC B12 levels  Hematology consult    3.  Multiple bilateral pulmonary nodules  CT chest showed multiple pulmonary nodules measuring up to 7 mm in diameter and sclerotic bony abnormality in mid thoracic spine posterior ribs sternum suspicious for malignancy  Consult hematology for malignancy work-up    4. AKI   On IV Fluid    5.  History of CAD  Hold aspirin Plavix due to GI bleed    6.   Atrial fibrillation  On amiodarone    7.  Hypertension  Stable    8.  DVT prophylaxis  SCD      Elana Alm [facility]  01/09/2018

## 2018-01-09 NOTE — Plan of Care (Signed)
Problem: Moderate/High Fall Risk Score >5  Goal: Patient will remain free of falls  Outcome: Progressing  Flowsheets (Taken 01/09/2018 0022)  High (Greater than 13): HIGH-Apply yellow "Fall Risk" arm band; HIGH-(VH Only) Yellow slippers; HIGH-(VH Only) Keep door open for better visability; HIGH-(VH Only) Place "Reset Bed Alarm" sign above bed if in use; HIGH-Consider use of low bed; HIGH-Activate bed/chair exit alarm where available     Problem: Hemodynamic Status: Cardiac  Goal: Stable vital signs and fluid balance  Outcome: Progressing  Flowsheets (Taken 01/09/2018 0022)  Stable vital signs and fluid balance: Monitor/assess vital signs and telemetry per unit protocol; Weigh on admission and record weight daily; Assess signs and symptoms associated with cardiac rhythm changes; Monitor lab values; Monitor for leg swelling/edema and report to LIP if abnormal     Problem: Altered GI Function  Goal: Nutritional intake is adequate  Outcome: Progressing  Flowsheets (Taken 01/09/2018 0022)  Nutritional intake is adequate: Monitor daily weights; Assist patient with meals/food selection; Allow adequate time for meals; Encourage/perform oral hygiene as appropriate; Assess anorexia, appetite, and amount of meal/food tolerated     Please see flow-sheets for vitals and trends. Pt. Remains A&Ox4, MAE, no complaints of pain.  SB on monitor, VSS. No bleeding noted or reported by patient.  H&H q12.  RA with adequate O2 saturation.  Regular diet, accu checks q4h.  Pt. Is voiding without difficulty.  Will continue to monitor closely.

## 2018-01-09 NOTE — Progress Notes (Signed)
Medicine Shift Note    Braden Scale Score: 20 (01/09/18 1830)  Skin Integrity: Abrasion, Redness, Bruising  Abrasion Skin Location: posterior head  Bruising Skin Location: LUE  Redness Skin Location: sacrum, back, chest  Patient Lines/Drains/Airways Status    Active Lines, Drains and Airways     Name:   Placement date:   Placement time:   Site:   Days:    Peripheral IV 01/07/18 Left Forearm   01/07/18    -    Forearm   2    Peripheral IV 01/07/18 Right Antecubital   01/07/18    1057    Antecubital   2    Peripheral IV 01/07/18 Left Antecubital   01/07/18    1925    Antecubital   2               Wound 01/07/18 Skin Tear Elbow Left (Active)   Date First Assessed/Time First Assessed: 01/07/18 1400   Wound Type: Skin Tear  Location: Elbow  Wound Location Orientation: Left  Present on Admission: Yes      Assessments 01/07/2018  2:00 PM 01/09/2018  6:30 PM   Site Description Red;Pink Other (Comment)   Peri-wound Description Maroon;Purple;Red Unable to assess   Drainage Amount Scant -   Drainage Description Thin -   Treatments Site care;Cleansed -   Dressing Silicone Adhesive Foam -   Dressing Changed New -   Dressing Status Clean;Dry;Intact -       No Linked orders to display       Wound 01/07/18 Skin Tear Elbow Right (Active)   Date First Assessed/Time First Assessed: 01/07/18 1401   Wound Type: Skin Tear  Location: Elbow  Wound Location Orientation: Right  Present on Admission: Yes      Assessments 01/07/2018  2:00 PM 01/09/2018  6:30 PM   Site Description Pink;Red Other (Comment)   Peri-wound Description Pink;Red;Maroon Unable to assess   Drainage Amount Scant -   Drainage Description Thin -   Treatments Site care;Cleansed -   Dressing Silicone Adhesive Foam -   Dressing Changed New -   Dressing Status Clean;Dry;Intact -       No Linked orders to display       Wound 01/07/18 Traumatic Head Posterior;Upper laceration (Active)   Date First Assessed/Time First Assessed: 01/07/18 1400   Wound Type: Traumatic  Location:  Head  Wound Location Orientation: Posterior;Upper  Wound Description (Comments): laceration  Present on Admission: Yes      Assessments 01/07/2018  2:00 PM 01/09/2018  6:30 PM   Site Description Black;Maroon;Red Black;Maroon   Peri-wound Description Red;Pink Pink;Red   Drainage Amount Scant -   Drainage Description Thin -   Treatments Site care;Cleansed -   Dressing Open to air Open to air   Dressing Status Dry;Intact Dry;Intact;Clean       No Linked orders to display       Last BM: 01/06/18    Pending Orders: N/A    Discharge Plan: when medically cleared    POC: patient    Skin: see chart    Tele: No    Activity: stand by assist    PT/OT: N/A    Interpreter Needs: no    Shift Note: Patient A/Ox4. Patient bradycardic. Patient denies pain. Assessed skin with Lurena Joiner, RN. Patient stable. Safety protocols in place.

## 2018-01-09 NOTE — Plan of Care (Signed)
Problem: Safety  Goal: Patient will be free from injury during hospitalization  Flowsheets (Taken 01/07/2018 1725 by Eleonore Chiquito, RN)  Patient will be free from injury during hospitalization : Assess patient's risk for falls and implement fall prevention plan of care per policy;Provide and maintain safe environment;Use appropriate transfer methods;Ensure appropriate safety devices are available at the bedside;Include patient/ family/ care giver in decisions related to safety;Hourly rounding;Assess for patients risk for elopement and implement Elopement Risk Plan per policy;Provide alternative method of communication if needed (communication boards, writing)     Problem: Compromised Tissue integrity  Goal: Nutritional status is improving  Flowsheets (Taken 01/09/2018 2030)  Nutritional status is improving: Assist patient with eating; Allow adequate time for meals; Include patient/patient care companion in decisions related to nutrition   Patient A&Ox4, MAE, FC, refused to get OOB, but has good bed mobility. Denies pain. RA, no desats, BP stable, afebrile. H&H Q12. Regular diet good appetite, no BM this shift.  AUOP via urinal. Report called to RN Sophia, patient to go to room 9.44. Transport ordered to transport patient to CT abd/pelvis and drop off patient to new room.  Unit supervisor took patient belongings including glasses, medications, and chart upstairs. See flowsheets for vitals and trends.

## 2018-01-09 NOTE — Consults (Addendum)
CONSULTATION    Date Time: 01/09/18 10:30 AM  Patient Name: [name]  Requesting Physician: Amanda Cockayne, MD      Reason for Consultation:   Lung nodules    Assessment:   Recurrent GI bleed.  Hemorrhagic shock- resolved.  Hemorrhagic anemia  Multiple Pulmonary nodules in RUL and LUL, concern about metastatic malignancy.  Multiple bony lesions in thoracic spine, posterior ribs and sternum concerning or metastatic disease.  A Fib  CAD S/P stents  Ischemic Cardiomyopathy  Pulm HTN  CKD    Plan:   Lung nodules appear too small to biopsy at this time. They could represent mets but may also be inflammatory. Would like to see older studies if available. If not, consider repeating CT chest in 3 months to evaluate for increasing size.  Would pursue biopsy of bony lesions to evaluate for metastatic disease.  Cont to avoid anti coagulation and antiplatelets.    History:   [name] is a 84 y.o. male who presents to the hospital on 01/07/2018 with PMH of A fib, CAD s/p stent placement who presents to the hospital on 01/07/2018 with weakness, recurrent falls and dark stool. He has a hx of GI bleeds in the past with most recent workup last month revealing AVMs that were cauterized. He notes his weakness gradually worsened over this past week. Dark stool present daily. Symptoms similar to his prior presentations for GI bleeds in the past. On admission his hemoglobin was found to be 4.7 and he was admitted to the ICU.   CT chest done to eval rt chest pain after his fall revealed multiple pulm nodules. No dyspnea or cough. Notes rt sided chest pain that developed after falling and is still present.    Past Medical History:     Past Medical History:   Diagnosis Date   . Anemia     iron low   . Black tarry stools 2019   . CKD (chronic kidney disease), stage III    . Coronary artery disease    . DOE (dyspnea on exertion)     mild   . Encounter for blood transfusion    . Glaucoma NEC    . Hyperlipidemia    . Hypertensive  disorder    . Macrocytosis    . Paroxysmal A-fib    . Skin cancer     2006 removed mole   . SLE (systemic lupus erythematosus) 1990   . Ulcerative colitis, chronic    . Vision abnormalities        Past Surgical History:     Past Surgical History:   Procedure Laterality Date   . CARDIAC SURGERY      3 stents   . EGD, COLONOSCOPY  04/03/2012    Procedure: EGD, COLONOSCOPY;  Surgeon: Lestine Mount, MD;  Location: [id number];  Service: Gastroenterology;  Laterality: N/A;   . EGD, COLONOSCOPY N/A 02/11/2017    Procedure: EGD, COLONOSCOPY;  Surgeon: Lestine Mount, MD;  Location: [facility];  Service: Gastroenterology;  Laterality: N/A;   . EGD, COLONOSCOPY N/A 12/08/2017    Procedure: EGD, COLONOSCOPY;  Surgeon: Jacqulyn Cane, MD;  Location: [facility];  Service: Gastroenterology;  Laterality: N/A;   . EYE SURGERY      cataract       Family History:   History reviewed. No pertinent family history.    Social History:     Social History     Socioeconomic History   . Marital status: Married  Spouse name: Not on file   . Number of children: Not on file   . Years of education: Not on file   . Highest education level: Not on file   Occupational History   . Not on file   Social Needs   . Financial resource strain: Not on file   . Food insecurity:     Worry: Not on file     Inability: Not on file   . Transportation needs:     Medical: Not on file     Non-medical: Not on file   Tobacco Use   . Smoking status: Former Smoker     Packs/day: 1.00     Years: 12.00     Pack years: 12.00     Types: Cigarettes     Last attempt to quit: 12/18/1961     Years since quitting: 56.0   . Smokeless tobacco: Never Used   Substance and Sexual Activity   . Alcohol use: Yes     Alcohol/week: 0.0 standard drinks     Comment: 0.5oz/d   . Drug use: No   . Sexual activity: Not on file   Lifestyle   . Physical activity:     Days per week: Not on file     Minutes per session: Not on file   . Stress: Not on file   Relationships   . Social  connections:     Talks on phone: Not on file     Gets together: Not on file     Attends religious service: Not on file     Active member of club or organization: Not on file     Attends meetings of clubs or organizations: Not on file     Relationship status: Not on file   . Intimate partner violence:     Fear of current or ex partner: Not on file     Emotionally abused: Not on file     Physically abused: Not on file     Forced sexual activity: Not on file   Other Topics Concern   . Not on file   Social History Narrative   . Not on file       Allergies:   No Known Allergies    Medications:     Current Facility-Administered Medications   Medication Dose Route Frequency   . atorvastatin  80 mg Oral QHS   . insulin lispro  1-5 Units Subcutaneous Q4H SCH       Review of Systems:   A comprehensive review of systems was: Negative except for what was mentioned in HPI    Physical Exam:     Vitals:    01/09/18 0900   BP: 122/61   Pulse: 62   Resp: 19   Temp:    SpO2: 99%   97.8    Intake and Output Summary (Last 24 hours) at Date Time    Intake/Output Summary (Last 24 hours) at 01/09/2018 1030  Last data filed at 01/09/2018 0700  Gross per 24 hour   Intake 470 ml   Output 700 ml   Net -230 ml       General appearance - acyanotic, in no respiratory distress  Mental status - alert, oriented to person, place, and time  Eyes - pupils equal and reactive, extraocular eye movements intact  Ears - not examined  Nose - normal and patent, no erythema, discharge or polyps  Mouth - mucous membranes moist, pharynx normal without lesions  Neck - supple, no significant adenopathy  Lymphatics - no palpable lymphadenopathy, no hepatosplenomegaly  Chest - clear to auscultation, no wheezes, rales or rhonchi, symmetric air entry  Heart - irregularly irregular rhythm with rate controlled  Abdomen - soft, nontender, nondistended, no masses or organomegaly  Extremities - peripheral pulses normal, no pedal edema, no clubbing or cyanosis    Labs  Reviewed:     Results     Procedure Component Value Units Date/Time    Glucose Whole Blood - POCT [[id number]] Collected:  01/09/18 0728     Updated:  01/09/18 0734     POCT - Glucose Whole blood 96 mg/dL     IRON PROFILE [[id number]]  (Abnormal) Collected:  01/09/18 0333     Updated:  01/09/18 0721     Iron 15 ug/dL      UIBC 308 ug/dL      TIBC 657 ug/dL      Iron Saturation 6 %     Hemolysis index [[id number]]  (Abnormal) Collected:  01/09/18 0333     Updated:  01/09/18 0710     Hemolysis Index 30    Glucose Whole Blood - POCT [[id number]]  (Abnormal) Collected:  01/08/18 1932     Updated:  01/09/18 0529     POCT - Glucose Whole blood 107 mg/dL     GFR [[id number]] Collected:  01/09/18 0333     Updated:  01/09/18 0424     EGFR 38.2    Narrative:       Start to draw 1 hour after current blood transfusion finishes    Magnesium [[id number]] Collected:  01/09/18 0333    Specimen:  Blood Updated:  01/09/18 0424     Magnesium 2.5 mg/dL     Narrative:       Start to draw 1 hour after current blood transfusion finishes    Basic Metabolic Panel [[id number]]  (Abnormal) Collected:  01/09/18 0333    Specimen:  Blood Updated:  01/09/18 0424     Glucose 96 mg/dL      BUN 32.9 mg/dL      Creatinine 1.7 mg/dL      Calcium 7.7 mg/dL      Sodium 518 mEq/L      Potassium 4.6 mEq/L      Chloride 115 mEq/L      CO2 20 mEq/L     Narrative:       Start to draw 1 hour after current blood transfusion finishes    CBC without differential [[id number]]  (Abnormal) Collected:  01/09/18 0333    Specimen:  Blood Updated:  01/09/18 0407     WBC 8.26 x10 3/uL      Hgb 7.7 g/dL      Hematocrit 16.0 %      Platelets 254 x10 3/uL      RBC 2.61 x10 6/uL      MCV 98.1 fL      MCH 29.5 pg      MCHC 30.1 g/dL      RDW 19 %      MPV 10.1 fL      Nucleated RBC 0.0 /100 WBC      Absolute NRBC 0.00 x10 3/uL     Narrative:       Start to draw 1 hour after current blood transfusion finishes    Glucose Whole Blood - POCT [[id number]] Collected:  01/09/18 0333      Updated:  01/09/18 0337     POCT - Glucose  Whole blood 99 mg/dL     Glucose Whole Blood - POCT [[id number]] Collected:  01/08/18 2326     Updated:  01/08/18 2330     POCT - Glucose Whole blood 97 mg/dL     Glucose Whole Blood - POCT [[id number]]  (Abnormal) Collected:  01/08/18 2010     Updated:  01/08/18 2201     POCT - Glucose Whole blood 114 mg/dL     CBC without differential [[id number]]  (Abnormal) Collected:  01/08/18 1619    Specimen:  Blood Updated:  01/08/18 1633     WBC 6.66 x10 3/uL      Hgb 7.4 g/dL      Hematocrit 08.6 %      Platelets 251 x10 3/uL      RBC 2.52 x10 6/uL      MCV 96.0 fL      MCH 29.4 pg      MCHC 30.6 g/dL      RDW 20 %      MPV 9.9 fL      Nucleated RBC 0.0 /100 WBC      Absolute NRBC 0.00 x10 3/uL     Narrative:       Start to draw 1 hour after current blood transfusion finishes    MRSA culture - Throat (If not done in triage) [[id number]] Collected:  01/07/18 1700    Specimen:  Body Fluid from Throat Updated:  01/08/18 1619     Culture MRSA Surveillance Negative for Methicillin Resistant Staph aureus    MRSA culture - Nares (If not done in triage) [[id number]] Collected:  01/07/18 1700    Specimen:  Body Fluid from Nares Updated:  01/08/18 1619     Culture MRSA Surveillance Negative for Methicillin Resistant Staph aureus    Glucose Whole Blood - POCT [[id number]]  (Abnormal) Collected:  01/08/18 1543     Updated:  01/08/18 1550     POCT - Glucose Whole blood 110 mg/dL     Glucose Whole Blood - POCT [[id number]]  (Abnormal) Collected:  01/08/18 1313     Updated:  01/08/18 1315     POCT - Glucose Whole blood 116 mg/dL     CBC without differential [[id number]]  (Abnormal) Collected:  01/08/18 1022    Specimen:  Blood Updated:  01/08/18 1031     WBC 6.50 x10 3/uL      Hgb 7.9 g/dL      Hematocrit 43.3 %      Platelets 250 x10 3/uL      RBC 2.66 x10 6/uL      MCV 97.7 fL      MCH 29.7 pg      MCHC 30.4 g/dL      RDW 20 %      MPV 10.1 fL      Nucleated RBC 0.0 /100 WBC      Absolute NRBC 0.00  x10 3/uL           Recent CBC   Recent Labs     01/09/18  0333   RBC 2.61*   Hgb 7.7*   Hematocrit 25.6*   MCV 98.1*   MCH 29.5   MCHC 30.1*   RDW 19*   MPV 10.1     Recent BMP   Recent Labs     01/09/18  0333   Glucose 96   BUN 48.0*   Creatinine 1.7*   Calcium 7.7*   Sodium 139   Potassium 4.6   Chloride  115*   CO2 20*       Rads:   Radiological Procedure reviewed.     Signed by: Sherrie Mustache, MD

## 2018-01-10 ENCOUNTER — Inpatient Hospital Stay: Payer: Medicare Other

## 2018-01-10 DIAGNOSIS — R808 Other proteinuria: Secondary | ICD-10-CM

## 2018-01-10 DIAGNOSIS — E8809 Other disorders of plasma-protein metabolism, not elsewhere classified: Secondary | ICD-10-CM

## 2018-01-10 LAB — LIPID PANEL
Cholesterol / HDL Ratio: 2.7
Cholesterol: 102 mg/dL (ref 0–199)
HDL: 38 mg/dL — ABNORMAL LOW (ref 40–9999)
LDL Calculated: 56 mg/dL (ref 0–99)
Triglycerides: 41 mg/dL (ref 34–149)
VLDL Calculated: 8 mg/dL — ABNORMAL LOW (ref 10–40)

## 2018-01-10 LAB — HEMOGLOBIN AND HEMATOCRIT, BLOOD
Hematocrit: 27.4 % — ABNORMAL LOW (ref 37.6–49.6)
Hgb: 8.3 g/dL — ABNORMAL LOW (ref 12.5–17.1)

## 2018-01-10 LAB — BASIC METABOLIC PANEL
BUN: 42 mg/dL — ABNORMAL HIGH (ref 9.0–28.0)
CO2: 17 mEq/L — ABNORMAL LOW (ref 22–29)
Calcium: 7.5 mg/dL — ABNORMAL LOW (ref 7.9–10.2)
Chloride: 117 mEq/L — ABNORMAL HIGH (ref 100–111)
Creatinine: 1.7 mg/dL — ABNORMAL HIGH (ref 0.7–1.3)
Glucose: 90 mg/dL (ref 70–100)
Potassium: 5 mEq/L (ref 3.5–5.1)
Sodium: 141 mEq/L (ref 136–145)

## 2018-01-10 LAB — CBC
Absolute NRBC: 0 10*3/uL (ref 0.00–0.00)
Hematocrit: 26.2 % — ABNORMAL LOW (ref 37.6–49.6)
Hgb: 7.9 g/dL — ABNORMAL LOW (ref 12.5–17.1)
MCH: 29.5 pg (ref 25.1–33.5)
MCHC: 30.2 g/dL — ABNORMAL LOW (ref 31.5–35.8)
MCV: 97.8 fL — ABNORMAL HIGH (ref 78.0–96.0)
MPV: 10 fL (ref 8.9–12.5)
Nucleated RBC: 0 /100 WBC (ref 0.0–0.0)
Platelets: 227 10*3/uL (ref 142–346)
RBC: 2.68 10*6/uL — ABNORMAL LOW (ref 4.20–5.90)
RDW: 19 % — ABNORMAL HIGH (ref 11–15)
WBC: 7.98 10*3/uL (ref 3.10–9.50)

## 2018-01-10 LAB — HEMOLYSIS INDEX: Hemolysis Index: 20 — ABNORMAL HIGH (ref 0–18)

## 2018-01-10 LAB — GFR: EGFR: 38.2

## 2018-01-10 LAB — MAGNESIUM: Magnesium: 2.4 mg/dL (ref 1.6–2.6)

## 2018-01-10 MED ORDER — FUROSEMIDE 40 MG PO TABS
40.00 mg | ORAL_TABLET | Freq: Every day | ORAL | Status: DC
Start: 2018-01-11 — End: 2018-01-15
  Administered 2018-01-11 – 2018-01-15 (×2): 40 mg via ORAL
  Filled 2018-01-10 (×4): qty 1

## 2018-01-10 MED ORDER — FUROSEMIDE 10 MG/ML IJ SOLN
40.00 mg | Freq: Once | INTRAMUSCULAR | Status: AC
Start: 2018-01-10 — End: 2018-01-10
  Administered 2018-01-10: 13:00:00 40 mg via INTRAVENOUS
  Filled 2018-01-10: qty 4

## 2018-01-10 MED ORDER — TECHNETIUM TC 99M OXIDRONATE INJECTION
27.60 | Freq: Once | Status: AC | PRN
Start: 2018-01-10 — End: 2018-01-10
  Administered 2018-01-10: 12:00:00 28 via INTRAVENOUS
  Filled 2018-01-10: qty 30

## 2018-01-10 MED ORDER — IRON SUCROSE 20 MG/ML IV SOLN
200.00 mg | INTRAVENOUS | Status: AC
Start: 2018-01-10 — End: 2018-01-11
  Administered 2018-01-10 – 2018-01-11 (×2): 200 mg via INTRAVENOUS
  Filled 2018-01-10 (×2): qty 10

## 2018-01-10 MED ORDER — AMIODARONE HCL 200 MG PO TABS
100.00 mg | ORAL_TABLET | Freq: Every day | ORAL | Status: DC
Start: 2018-01-10 — End: 2018-01-15
  Administered 2018-01-10 – 2018-01-15 (×5): 100 mg via ORAL
  Filled 2018-01-10 (×6): qty 1

## 2018-01-10 NOTE — Progress Notes (Addendum)
Medicine Shift Note    Braden Scale Score: 21 (01/10/18 0850)  Skin Integrity: Tear, Bruising, Laceration, Redness  Abrasion Skin Location: posterior head  Bruising Skin Location: chest, forearms  Redness Skin Location: sacrum, back, chest  Patient Lines/Drains/Airways Status    Active Lines, Drains and Airways     Name:   Placement date:   Placement time:   Site:   Days:    Peripheral IV 01/07/18 Left Forearm   01/07/18    -    Forearm   3    Peripheral IV 01/07/18 Right Antecubital   01/07/18    1057    Antecubital   3    Peripheral IV 01/07/18 Left Antecubital   01/07/18    1925    Antecubital   2               Wound 01/07/18 Skin Tear Elbow Left (Active)   Date First Assessed/Time First Assessed: 01/07/18 1400   Wound Type: Skin Tear  Location: Elbow  Wound Location Orientation: Left  Present on Admission: Yes      Assessments 01/07/2018  2:00 PM 01/09/2018  6:30 PM   Site Description Red;Pink Other (Comment)   Peri-wound Description Maroon;Purple;Red Unable to assess   Drainage Amount Scant -   Drainage Description Thin -   Treatments Site care;Cleansed -   Dressing Silicone Adhesive Foam -   Dressing Changed New -   Dressing Status Clean;Dry;Intact -       No Linked orders to display       Wound 01/07/18 Skin Tear Elbow Right (Active)   Date First Assessed/Time First Assessed: 01/07/18 1401   Wound Type: Skin Tear  Location: Elbow  Wound Location Orientation: Right  Present on Admission: Yes      Assessments 01/07/2018  2:00 PM 01/09/2018  6:30 PM   Site Description Pink;Red Other (Comment)   Peri-wound Description Pink;Red;Maroon Unable to assess   Drainage Amount Scant -   Drainage Description Thin -   Treatments Site care;Cleansed -   Dressing Silicone Adhesive Foam -   Dressing Changed New -   Dressing Status Clean;Dry;Intact -       No Linked orders to display       Wound 01/07/18 Traumatic Head Posterior;Upper laceration (Active)   Date First Assessed/Time First Assessed: 01/07/18 1400   Wound Type:  Traumatic  Location: Head  Wound Location Orientation: Posterior;Upper  Wound Description (Comments): laceration  Present on Admission: Yes      Assessments 01/07/2018  2:00 PM 01/09/2018 11:09 PM   Site Description Black;Maroon;Red Black;Maroon   Peri-wound Description Red;Pink Pink;Red   Drainage Amount Scant None   Drainage Description Thin -   Treatments Site care;Cleansed -   Dressing Open to air Cadexomer Iodine Gel;Open to air   Dressing Status Dry;Intact -       No Linked orders to display       Last BM: 10/181/19    Pending Orders: Labs    Discharge Plan: TBD    POC: Daughter and Patient    Skin: bruises for bilateral forearm, right arm and chest. Redness to coccyx and crease of buttocks, laceration to posterior head    Tele: No    Activity: As tolerated    PT/OT: No     Interpreter Needs:NO    Shift Note: A&Ox4,denies pain, fall precaution in place, IV fluid infusing and tolerating it well, call bell within reach, at bone scan procedure, continue to monitor.  Return  back from procedure with no discomfort noted. Transferred back into bed, 0.9 NacL with potassium  Infusing.

## 2018-01-10 NOTE — Progress Notes (Signed)
Spoke with pt regarding d/c planning - pt very adamant about going home - pt will accept HHPT/OT, but will not go to a SNF or assisted living - reports daughter checks in on him regularly as he lives alone and wants to remain living at home - call placed to daughter, Harvy Riera, per pt request with message left for return call to discuss d/c planning.    Colon Branch, RNCM  [facility]  [phone]  Case Management Dept.

## 2018-01-10 NOTE — Progress Notes (Signed)
[name] is a 84 y.o. male patient.  Active Problems:    GI bleed  [name] is a 84 y.o. 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  The patient was recently admitted from 12/03/17-12/12/17 for anemia and GI bleed.  EGD at the time showed many angioectasias of the stomach and duodenum some of which were actively oozing and ablated.  He also had epistaxis which required packing by ENT during that admission.  After admission, he restarted plavix and was doing well up until the last week.  He has been progressively more dizzy and weak and has fallen several times due to his legs buckling.  He says he never lost consciousness, no bowel or bladder incontinence, no tongue biting.  He has noticed dark black stool but is on iron and attributed the dark stools to iron.  He vomited yesterday but no hematemesis.  He denies CP, abdominal pain, SOB.  He has right sided tenderness to palpation due to his fall.  The patient has not taken any medication since Thursday because he hasn't felt well and in fact has stayed in bed for the last few days.   Past Medical History:   Diagnosis Date   . Anemia     iron low   . Black tarry stools 2019   . CKD (chronic kidney disease), stage III    . Coronary artery disease    . DOE (dyspnea on exertion)     mild   . Encounter for blood transfusion    . Glaucoma NEC    . Hyperlipidemia    . Hypertensive disorder    . Macrocytosis    . Paroxysmal A-fib    . Skin cancer     2006 removed mole   . SLE (systemic lupus erythematosus) 1990   . Ulcerative colitis, chronic    . Vision abnormalities      Current Facility-Administered Medications   Medication Dose Route Frequency Provider Last Rate Last Dose   . 0.9 % NaCl with KCl 20 mEq infusion   Intravenous Continuous Amanda Cockayne, MD 75 mL/hr at 01/10/18 1215 75 mL/hr at 01/10/18 1215   . 0.9%  NaCl infusion    Intravenous PRN Cherlyn Cushing, MD       . 0.9%  NaCl infusion   Intravenous PRN Cherlyn Cushing, MD       . acetaminophen (TYLENOL) suppository 650 mg  650 mg Rectal Q6H PRN Cherlyn Cushing, MD   650 mg at 01/07/18 1602   . amiodarone (PACERONE) tablet 100 mg  100 mg Oral Daily Ng, Jared [place], PA   100 mg at 01/10/18 1324   . atorvastatin (LIPITOR) tablet 80 mg  80 mg Oral QHS [place], [place]   80 mg at 01/09/18 2150   . dextrose (GLUCOSE) 40 % oral gel 15 g of glucose  15 g of glucose Oral PRN Cherlyn Cushing, MD        And   . dextrose 50 % bolus 12.5 g  12.5 g Intravenous PRN Cherlyn Cushing, MD        And   . glucagon (rDNA) (GLUCAGEN) injection 1 mg  1 mg Intramuscular PRN Cherlyn Cushing, MD       . fentaNYL (PF) (SUBLIMAZE) injection 25 mcg  25 mcg Intravenous Q2H PRN Cherlyn Cushing,  MD       . Melene Muller ON 01/11/2018] furosemide (LASIX) tablet 40 mg  40 mg Oral Daily Ng, Jared [place], [place]       . iron sucrose (VENOFER) injection 200 mg  200 mg Intravenous Q24H [facility] Sahebi, Camila, MD   200 mg at 01/10/18 1316   . ondansetron (ZOFRAN) injection 4 mg  4 mg Intravenous Q8H PRN Cherlyn Cushing, MD         No Known Allergies  Blood pressure 94/50, pulse 62, temperature 97.9 F (36.6 C), temperature source Oral, resp. rate 16, weight 53.5 kg (117 lb 15.1 oz), SpO2 100 %.    Subjective  Objective:  General Appearance:  Comfortable and in no acute distress.    Vital signs: (most recent): Blood pressure 94/50, pulse 62, temperature 97.9 F (36.6 C), temperature source Oral, resp. rate 16, weight 53.5 kg (117 lb 15.1 oz), SpO2 100 %.  Vital signs are normal.    Output: Producing urine.    HEENT: Normal HEENT exam.    Lungs:  Normal effort and normal respiratory rate.  Breath sounds clear to auscultation.    Heart: Normal rate.  Regular rhythm.  S1 normal.    Abdomen: Abdomen is soft.  Bowel sounds are normal.   There is no abdominal tenderness.     Extremities: Normal range of  motion.    Pulses: Distal pulses are intact.    Neurological: Patient is alert.    Pupils:  Pupils are equal, round, and reactive to light.    Skin:  Warm, dry and pale.      Results     Procedure Component Value Units Date/Time    Protein electrophoresis, urine [[id number]] Collected:  01/10/18 1517    Specimen:  Urine Updated:  01/10/18 1957     Protein, UR <7.0 mg/dL     Protein electrophoresis, serum [[id number]] Collected:  01/10/18 1518    Specimen:  Blood Updated:  01/10/18 1853    Narrative:       Pro Electro Serum    Immunofixation electrophoresis, Serum [[id number]] Collected:  01/10/18 1518    Specimen:  Blood Updated:  01/10/18 1853    URINE IMMUNOFIXATION [[id number]] Collected:  01/10/18 1517     Updated:  01/10/18 1828    Hemoglobin and hematocrit, blood [[id number]]  (Abnormal) Collected:  01/10/18 1518    Specimen:  Blood Updated:  01/10/18 1547     Hgb 8.3 g/dL      Hematocrit 25.3 %     Free Kappa & Lambda Lt Chains & Ratio, S [[id number]] Collected:  01/10/18 1517    Specimen:  Blood Updated:  01/10/18 1540    Lipid panel [[id number]]  (Abnormal) Collected:  01/10/18 0321    Specimen:  Blood Updated:  01/10/18 0505     Cholesterol 102 mg/dL      Triglycerides 41 mg/dL      HDL 38 mg/dL      LDL Calculated 56 mg/dL      VLDL Cholesterol Cal 8 mg/dL      CHOL/HDL Ratio 2.7    Hemolysis index [[id number]]  (Abnormal) Collected:  01/10/18 0321     Updated:  01/10/18 0505     Hemolysis Index 20    GFR [[id number]] Collected:  01/10/18 0322     Updated:  01/10/18 0447     EGFR 38.2    Magnesium [[id number]] Collected:  01/10/18 0322    Specimen:  Blood Updated:  01/10/18 0447  Magnesium 2.4 mg/dL     Basic Metabolic Panel [[id number]]  (Abnormal) Collected:  01/10/18 0322    Specimen:  Blood Updated:  01/10/18 0447     Glucose 90 mg/dL      BUN 40.9 mg/dL      Creatinine 1.7 mg/dL      Calcium 7.5 mg/dL      Sodium 811 mEq/L      Potassium 5.0 mEq/L      Chloride 117 mEq/L      CO2 17 mEq/L     CBC without  differential [[id number]]  (Abnormal) Collected:  01/10/18 0322    Specimen:  Blood Updated:  01/10/18 0427     WBC 7.98 x10 3/uL      Hgb 7.9 g/dL      Hematocrit 21.3 %      Platelets 227 x10 3/uL      RBC 2.68 x10 6/uL      MCV 97.8 fL      MCH 29.5 pg      MCHC 30.2 g/dL      RDW 19 %      MPV 10.0 fL      Nucleated RBC 0.0 /100 WBC      Absolute NRBC 0.00 x10 3/uL     Ferritin [[id number]] Collected:  01/09/18 1523    Specimen:  Blood Updated:  01/09/18 2122     Ferritin 34.92 ng/mL     Narrative:       Start to draw 1 hour after current blood transfusion finishes    Folate [[id number]] Collected:  01/09/18 1523    Specimen:  Blood Updated:  01/09/18 2122     Folate 6.0 ng/mL     Narrative:       Start to draw 1 hour after current blood transfusion finishes    Hemolysis index [[id number]] Collected:  01/09/18 1523     Updated:  01/09/18 2031     Hemolysis Index 14    Narrative:       Start to draw 1 hour after current blood transfusion finishes      Ct Abdomen Pelvis Wo Iv/ Wo Po Contrast    Result Date: 01/09/2018   1. Moderate-sized bilateral pleural effusions with adjacent atelectasis 2. Small left adrenal nodule likely adenoma 3. Multiple small hepatic cysts 4. No gross malignancy or adenopathy identified on this nonenhanced CT J. Carole Binning, MD 01/09/2018 4:40 PM    Ct Head Without Contrast    Result Date: 01/07/2018  1. No acute intracranial hemorrhage is detected. 2. There is mild subcortical, deep, and periventricular leukomalacia in both cerebral hemispheres as well as in the infratentorial white matter.   This is suggestive of chronic small vessel ischemic change. Theodoro Doing, MD 01/07/2018 11:36 AM    Ct Chest Without Contrast    Result Date: 01/07/2018  1. Multiple bilateral pulmonary nodules measuring up to 7 mm in diameter 2. Sclerotic bony abnormality in mid thoracic spine, posterior ribs, and sternum suspicious for metastatic disease J. Carole Binning, MD 01/07/2018 11:55 AM    Nm Bone Scan  Whole Body    Result Date: 01/10/2018   Multifocal osseous metastatic disease. Collene Schlichter, MD 01/10/2018 4:51 PM    Assessment & Plan     1.  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  Acute on chronic anemia  Iron TIBC B12 levels  Hematology consult    3.  Multiple bilateral pulmonary nodules  CT chest showed multiple pulmonary nodules measuring up to 7 mm in diameter and sclerotic bony abnormality in mid thoracic spine posterior ribs sternum suspicious for malignancy  Consult hematology for malignancy work-up    4. AKI   On IV Fluid    5.  History of CAD  Hold aspirin Plavix due to GI bleed    6.   Atrial fibrillation  On amiodarone    7.  Hypertension  Stable    8.  DVT prophylaxis  SCD      Elana Alm [facility]  01/10/2018

## 2018-01-10 NOTE — Progress Notes (Signed)
PROGRESS NOTE    Date Time: 01/10/18 10:24 AM  Patient Name: Nosbisch,Caston E      Assessment:   1.Lung nodule  2.Anemia:,with  longstanding history mucosal angioectasia/ AVM  3.Bone lesion  4.CAD S/P PCI   5.Afib: AC on hold due to bleed   6.HTN  7.HLD: ON Statin  Plan:   -CT A/P : neg for Malignancy  -pending Bone Scan results  -Will send for repeat Myeloma panel  -Ferritin borderline low will start pt on IV venofer 200 mg IV x 2 doses  -Will cont to monitor the trend of CBC  Cont plan of care  All labs,notes and past 24 hrs events reviewed   Subjective:   Pt was seen at bedside,feels better today, appetite is good, no CP or SOB  Medications:     Current Facility-Administered Medications   Medication Dose Route Frequency   . atorvastatin  80 mg Oral QHS       Review of Systems:   A comprehensive review of systems was:  Ten point review of systems negative or as per above endorsements.    Physical Exam:     Vitals:    01/10/18 0922   BP: 106/54   Pulse: 64   Resp:    Temp: 97.8 F (36.6 C)   SpO2:        Intake and Output Summary (Last 24 hours) at Date Time    Intake/Output Summary (Last 24 hours) at 01/10/2018 1024  Last data filed at 01/10/2018 0800  Gross per 24 hour   Intake 10 ml   Output 1050 ml   Net -1040 ml       General appearance - alert, well appearing, and in no distress  Mental status - alert, oriented to person, place, and time  Eyes - pupils equal and reactive, extraocular eye movements intact  Ears - bilateral TM's and external ear canals normal  Nose - normal and patent, no erythema, discharge or polyps  Mouth - mucous membranes moist, pharynx normal without lesions  Neck - supple, no significant adenopathy  Chest - clear to auscultation, no wheezes, rales or rhonchi, symmetric air entry  Heart - normal rate, regular rhythm, normal S1, S2, no murmurs, rubs, clicks or gallops  Abdomen - soft, nontender, nondistended, no masses or organomegaly  Neurological - alert, oriented, normal speech, no focal  findings or movement disorder noted  Musculoskeletal - no joint tenderness, deformity or swelling  Extremities - peripheral pulses normal, no pedal edema, no clubbing or cyanosis  Skin - normal coloration and turgor, no rashes, no suspicious skin lesions noted    Labs:     Results     Procedure Component Value Units Date/Time    Lipid panel [[id number]]  (Abnormal) Collected:  01/10/18 0321    Specimen:  Blood Updated:  01/10/18 0505     Cholesterol 102 mg/dL      Triglycerides 41 mg/dL      HDL 38 mg/dL      LDL Calculated 56 mg/dL      VLDL Cholesterol Cal 8 mg/dL      CHOL/HDL Ratio 2.7    Hemolysis index [[id number]]  (Abnormal) Collected:  01/10/18 0321     Updated:  01/10/18 0505     Hemolysis Index 20    GFR [[id number]] Collected:  01/10/18 0322     Updated:  01/10/18 0447     EGFR 38.2    Magnesium [[id number]] Collected:  01/10/18 0865  Specimen:  Blood Updated:  01/10/18 0447     Magnesium 2.4 mg/dL     Basic Metabolic Panel [[id number]]  (Abnormal) Collected:  01/10/18 0322    Specimen:  Blood Updated:  01/10/18 0447     Glucose 90 mg/dL      BUN 21.3 mg/dL      Creatinine 1.7 mg/dL      Calcium 7.5 mg/dL      Sodium 086 mEq/L      Potassium 5.0 mEq/L      Chloride 117 mEq/L      CO2 17 mEq/L     CBC without differential [[id number]]  (Abnormal) Collected:  01/10/18 0322    Specimen:  Blood Updated:  01/10/18 0427     WBC 7.98 x10 3/uL      Hgb 7.9 g/dL      Hematocrit 52.8 %      Platelets 227 x10 3/uL      RBC 2.68 x10 6/uL      MCV 97.8 fL      MCH 29.5 pg      MCHC 30.2 g/dL      RDW 19 %      MPV 10.0 fL      Nucleated RBC 0.0 /100 WBC      Absolute NRBC 0.00 x10 3/uL     Ferritin [[id number]] Collected:  01/09/18 1523    Specimen:  Blood Updated:  01/09/18 2122     Ferritin 34.92 ng/mL     Narrative:       Start to draw 1 hour after current blood transfusion finishes    Folate [[id number]] Collected:  01/09/18 1523    Specimen:  Blood Updated:  01/09/18 2122     Folate 6.0 ng/mL     Narrative:        Start to draw 1 hour after current blood transfusion finishes    Hemolysis index [[id number]] Collected:  01/09/18 1523     Updated:  01/09/18 2031     Hemolysis Index 14    Narrative:       Start to draw 1 hour after current blood transfusion finishes    Reticulocytes [[id number]]  (Abnormal) Collected:  01/09/18 1523    Specimen:  Blood Updated:  01/09/18 1820     Reticulocyte Count Automated 6.4 %      Retic Ct Abs 0.1925 x10 6/uL      Immature Retic Fract 17.7 %      Retic Hgb 21.4 pg     Narrative:       Start to draw 1 hour after current blood transfusion finishes    CBC without differential [[id number]]  (Abnormal) Collected:  01/09/18 1523    Specimen:  Blood Updated:  01/09/18 1550     WBC 8.50 x10 3/uL      Hgb 8.5 g/dL      Hematocrit 60.6 %      Platelets 274 x10 3/uL      RBC 2.87 x10 6/uL      MCV 97.6 fL      MCH 29.6 pg      MCHC 30.4 g/dL      RDW 19 %      MPV 10.1 fL      Nucleated RBC 0.2 /100 WBC      Absolute NRBC 0.02 x10 3/uL     Narrative:       Start to draw 1 hour after current blood transfusion finishes    Glucose Whole  Blood - POCT [[id number]]  (Abnormal) Collected:  01/09/18 1151     Updated:  01/09/18 1203     POCT - Glucose Whole blood 124 mg/dL               Rads:   Ct Abdomen Pelvis Wo Iv/ Wo Po Contrast    Result Date: 01/09/2018   1. Moderate-sized bilateral pleural effusions with adjacent atelectasis 2. Small left adrenal nodule likely adenoma 3. Multiple small hepatic cysts 4. No gross malignancy or adenopathy identified on this nonenhanced CT J. Carole Binning, MD 01/09/2018 4:40 PM    Radiological Procedure reviewed.    Signed by: Lynell Bellow

## 2018-01-10 NOTE — Plan of Care (Signed)
Problem: Safety  Goal: Patient will be free from injury during hospitalization  Outcome: Progressing  Flowsheets (Taken 01/10/2018 1119)  Patient will be free from injury during hospitalization : Provide and maintain safe environment; Use appropriate transfer methods; Hourly rounding  Goal: Patient will be free from infection during hospitalization  Outcome: Progressing  Flowsheets (Taken 01/10/2018 1119)  Free from Infection during hospitalization: Assess and monitor for signs and symptoms of infection; Monitor all insertion sites (i.e. indwelling lines, tubes, urinary catheters, and drains)     Problem: Pain  Goal: Pain at adequate level as identified by patient  Outcome: Progressing  Flowsheets (Taken 01/10/2018 1119)  Pain at adequate level as identified by patient: Assess pain on admission, during daily assessment and/or before any "as needed" intervention(s)     Problem: Side Effects from Pain Analgesia  Goal: Patient will experience minimal side effects of analgesic therapy  Outcome: Progressing  Flowsheets (Taken 01/10/2018 1119)  Patient will experience minimal side effects of analgesic therapy: Monitor/assess patient's respiratory status (RR depth, effort, breath sounds); Assess for changes in cognitive function     Problem: Discharge Barriers  Goal: Patient will be discharged home or other facility with appropriate resources  Outcome: Progressing  Flowsheets (Taken 01/10/2018 1119)  Discharge to home or other facility with appropriate resources: Initiate discharge planning     Problem: Psychosocial and Spiritual Needs  Goal: Demonstrates ability to cope with hospitalization/illness  Outcome: Progressing  Flowsheets (Taken 01/10/2018 1119)  Demonstrates ability to cope with hospitalizations/illness: Encourage verbalization of feelings/concerns/expectations; Provide quiet environment     Problem: Moderate/High Fall Risk Score >5  Goal: Patient will remain free of falls  Outcome: Progressing  Flowsheets  (Taken 01/10/2018 1119)  VH Moderate Risk (6-13): Use of floor mat; YELLOW NON-SKID SLIPPERS     Problem: Compromised Tissue integrity  Goal: Damaged tissue is healing and protected  Outcome: Progressing  Flowsheets (Taken 01/10/2018 1119)  Damaged tissue is healing and protected : Monitor/assess Braden scale every shift; Avoid shearing injuries  Goal: Nutritional status is improving  Outcome: Progressing  Flowsheets (Taken 01/10/2018 1119)  Nutritional status is improving: Allow adequate time for meals     Problem: Hemodynamic Status: Cardiac  Goal: Stable vital signs and fluid balance  Outcome: Progressing  Flowsheets (Taken 01/10/2018 1119)  Stable vital signs and fluid balance: Monitor lab values     Problem: Inadequate Tissue Perfusion  Goal: Adequate tissue perfusion will be maintained  Outcome: Progressing  Flowsheets (Taken 01/10/2018 1119)  Adequate tissue perfusion will be maintained: Monitor/assess vital signs; Monitor/assess lab values and report abnormal values     Problem: Altered GI Function  Goal: Fluid and electrolyte balance are achieved/maintained  Outcome: Progressing  Flowsheets (Taken 01/10/2018 1119)  Fluid and electrolyte balance are achieved/maintained: Provide adequate hydration; Monitor/assess lab values and report abnormal values  Goal: Elimination patterns are normal or improving  Outcome: Progressing  Flowsheets (Taken 01/10/2018 1119)  Elimination patterns are normal or improving: Report abnormal assessment to physician; Assess for normal bowel sounds; Monitor for abdominal distension  Goal: Nutritional intake is adequate  Outcome: Progressing  Flowsheets (Taken 01/10/2018 1119)  Nutritional intake is adequate: Allow adequate time for meals  Goal: Mobility/Activity is maintained at optimal level for patient  Outcome: Progressing  Flowsheets (Taken 01/10/2018 1119)  Mobility/activity is maintained at optimal level for patient: Assess for changes in respiratory status, level of  consciousness and/or development of fatigue  Goal: No bleeding  Outcome: Progressing  Flowsheets (Taken 01/10/2018 1119)  No bleeding : Monitor/assess lab values and report abnormal values

## 2018-01-10 NOTE — Plan of Care (Signed)
Patient adamant to not speak to me today regarding his condition or concerns.  States "I already have a doctor" and "I told you not to come back".  Attempted to reach out to daughter, Nadir Vasques regarding collateral and discussion of medications in setting of AVM and CAD.  Discussed with her, although her sister, Kathee Polite with phone number [phone] is the POA.  Discussed risks and benefits of aspirin, they will discuss together and with patient and will reach back out to [place].  Otherwise, would give some diuresis due to pleural effusions on CT A/P, not on CT lungs previously likely in setting of transfusions.    Acute on chronic anemia:  -Continue hold plavix  -Can consider aspirin as discussed with daughters Haruto Demaria and Kathee Polite [facility]). But if deemed too high risk by GI then can hold and defer decision to outpatient.  Otherwise, can follow up with family and patient about decision for aspirin.    CAD - LCx 02/22/17  -Continue statin.    -As above with antiplatelets    Paroxysmal a-fib (CHADS2 Vasc score 5)  - Restart home amiodarone 100mg  PO QD  - Not on AC due to AVM    HFrEF LVEF 36% (02/11/2017)  - Rec Lasix 40mg  IV x1 then resuming home lasix 40mg  PO QD for euvolemia  - Restart lisinopril once renal function improved. Hold BB due to bradycardia.    Will sign off as patient not open to continued inpatient encounters from provider.  Can have patient follow up outpatient for further care and monitoring    Amalia Hailey, PA-C  [facility]  Spectralink [phone]

## 2018-01-10 NOTE — Plan of Care (Signed)
Problem: Safety  Goal: Patient will be free from injury during hospitalization  Outcome: Progressing  Flowsheets (Taken 01/10/2018 2126)  Patient will be free from injury during hospitalization : Assess patient's risk for falls and implement fall prevention plan of care per policy; Provide and maintain safe environment; Hourly rounding; Include patient/ family/ care giver in decisions related to safety  Goal: Patient will be free from infection during hospitalization  Outcome: Progressing  Flowsheets (Taken 01/10/2018 2126)  Free from Infection during hospitalization: Assess and monitor for signs and symptoms of infection; Monitor lab/diagnostic results; Monitor all insertion sites (i.e. indwelling lines, tubes, urinary catheters, and drains)     Problem: Pain  Goal: Pain at adequate level as identified by patient  Outcome: Progressing  Flowsheets (Taken 01/10/2018 2126)  Pain at adequate level as identified by patient: Identify patient comfort function goal; Assess pain on admission, during daily assessment and/or before any "as needed" intervention(s); Reassess pain within 30-60 minutes of any procedure/intervention, per Pain Assessment, Intervention, Reassessment (AIR) Cycle; Include patient/patient care companion in decisions related to pain management as needed     Problem: Side Effects from Pain Analgesia  Goal: Patient will experience minimal side effects of analgesic therapy  Outcome: Progressing  Flowsheets (Taken 01/10/2018 2126)  Patient will experience minimal side effects of analgesic therapy: Monitor/assess patient's respiratory status (RR depth, effort, breath sounds); Assess for changes in cognitive function; Prevent/manage side effects per LIP orders (i.e. nausea, vomiting, pruritus, constipation, urinary retention, etc.)     Problem: Discharge Barriers  Goal: Patient will be discharged home or other facility with appropriate resources  Outcome: Progressing  Flowsheets (Taken 01/10/2018  2126)  Discharge to home or other facility with appropriate resources: Provide appropriate patient education     Problem: Psychosocial and Spiritual Needs  Goal: Demonstrates ability to cope with hospitalization/illness  Outcome: Progressing  Flowsheets (Taken 01/10/2018 2126)  Demonstrates ability to cope with hospitalizations/illness: Encourage verbalization of feelings/concerns/expectations; Provide quiet environment; Include patient/ patient care companion in decisions     Problem: Moderate/High Fall Risk Score >5  Goal: Patient will remain free of falls  Outcome: Progressing  Flowsheets (Taken 01/10/2018 2100)  High (Greater than 13): HIGH-Activate bed/chair exit alarm where available;HIGH-Apply yellow "Fall Risk" arm band;HIGH-Initiate use of floor mats as appropriate     Problem: Compromised Tissue integrity  Goal: Damaged tissue is healing and protected  Outcome: Progressing  Flowsheets (Taken 01/10/2018 2126)  Damaged tissue is healing and protected : Monitor/assess Braden scale every shift; Increase activity as tolerated/progressive mobility; Avoid shearing injuries; Keep intact skin clean and dry  Goal: Nutritional status is improving  Outcome: Progressing  Flowsheets (Taken 01/10/2018 2126)  Nutritional status is improving: Allow adequate time for meals; Include patient/patient care companion in decisions related to nutrition     Problem: Hemodynamic Status: Cardiac  Goal: Stable vital signs and fluid balance  Outcome: Progressing  Flowsheets (Taken 01/10/2018 2126)  Stable vital signs and fluid balance: Monitor lab values; Assess signs and symptoms associated with cardiac rhythm changes     Problem: Inadequate Tissue Perfusion  Goal: Adequate tissue perfusion will be maintained  Outcome: Progressing  Flowsheets (Taken 01/10/2018 2126)  Adequate tissue perfusion will be maintained: Monitor/assess vital signs; Monitor/assess lab values and report abnormal values; Monitor/assess neurovascular status  (pulses, capillary refill, pain, paresthesia, paralysis, presence of edema); Monitor intake and output     Problem: Altered GI Function  Goal: Fluid and electrolyte balance are achieved/maintained  Outcome: Progressing  Flowsheets (Taken 01/10/2018  2126)  Fluid and electrolyte balance are achieved/maintained: Monitor intake and output every shift; Monitor/assess lab values and report abnormal values  Goal: Elimination patterns are normal or improving  Outcome: Progressing  Flowsheets (Taken 01/10/2018 2126)  Elimination patterns are normal or improving: Report abnormal assessment to physician; Anticipate/assist with toileting needs; Assess for normal bowel sounds; Monitor for abdominal distension; Monitor for abdominal discomfort; Assess for signs and symptoms of bleeding.  Report signs of bleeding to physician  Goal: Nutritional intake is adequate  Outcome: Progressing  Flowsheets (Taken 01/10/2018 2126)  Nutritional intake is adequate: Include patient/patient care companion in decisions related to nutrition; Assist patient with meals/food selection  Goal: Mobility/Activity is maintained at optimal level for patient  Outcome: Progressing  Flowsheets (Taken 01/10/2018 2126)  Mobility/activity is maintained at optimal level for patient: Increase mobility as tolerated/progressive mobility; Maintain proper body alignment; Plan activities to conserve energy, plan rest periods  Goal: No bleeding  Outcome: Progressing  Flowsheets (Taken 01/10/2018 2126)  No bleeding : Assess for bruising/petechia; Monitor/assess lab values and report abnormal values

## 2018-01-10 NOTE — Progress Notes (Signed)
Medicine Shift Note    Braden Scale Score: 19 (01/09/18 2309)  Skin Integrity: Abrasion, Redness, Bruising  Abrasion Skin Location: posterior head  Bruising Skin Location: LUE  Redness Skin Location: sacrum, back, chest  Patient Lines/Drains/Airways Status    Active Lines, Drains and Airways     Name:   Placement date:   Placement time:   Site:   Days:    Peripheral IV 01/07/18 Left Forearm   01/07/18    -    Forearm   3    Peripheral IV 01/07/18 Right Antecubital   01/07/18    1057    Antecubital   2    Peripheral IV 01/07/18 Left Antecubital   01/07/18    1925    Antecubital   2               Wound 01/07/18 Skin Tear Elbow Left (Active)   Date First Assessed/Time First Assessed: 01/07/18 1400   Wound Type: Skin Tear  Location: Elbow  Wound Location Orientation: Left  Present on Admission: Yes      Assessments 01/07/2018  2:00 PM 01/09/2018  6:30 PM   Site Description Red;Pink Other (Comment)   Peri-wound Description Maroon;Purple;Red Unable to assess   Drainage Amount Scant -   Drainage Description Thin -   Treatments Site care;Cleansed -   Dressing Silicone Adhesive Foam -   Dressing Changed New -   Dressing Status Clean;Dry;Intact -       No Linked orders to display       Wound 01/07/18 Skin Tear Elbow Right (Active)   Date First Assessed/Time First Assessed: 01/07/18 1401   Wound Type: Skin Tear  Location: Elbow  Wound Location Orientation: Right  Present on Admission: Yes      Assessments 01/07/2018  2:00 PM 01/09/2018  6:30 PM   Site Description Pink;Red Other (Comment)   Peri-wound Description Pink;Red;Maroon Unable to assess   Drainage Amount Scant -   Drainage Description Thin -   Treatments Site care;Cleansed -   Dressing Silicone Adhesive Foam -   Dressing Changed New -   Dressing Status Clean;Dry;Intact -       No Linked orders to display       Wound 01/07/18 Traumatic Head Posterior;Upper laceration (Active)   Date First Assessed/Time First Assessed: 01/07/18 1400   Wound Type: Traumatic  Location:  Head  Wound Location Orientation: Posterior;Upper  Wound Description (Comments): laceration  Present on Admission: Yes      Assessments 01/07/2018  2:00 PM 01/09/2018 11:09 PM   Site Description Black;Maroon;Red Black;Maroon   Peri-wound Description Red;Pink Pink;Red   Drainage Amount Scant None   Drainage Description Thin -   Treatments Site care;Cleansed -   Dressing Open to air Cadexomer Iodine Gel;Open to air   Dressing Status Dry;Intact -       No Linked orders to display       Last BM: 10/18    Pending Orders: Labs, H&H, bone scan     Discharge Plan: once medically cleared    POC: patient    Skin: abrasion on head, skin tear on bilateral elbows    Tele: No    Activity: x1 assist     PT/OT: needs evaluation     Interpreter Needs: No    Shift Note: Pt A&Ox4, denies pain, VSS, started on IVF, fall precautions in place, call bell within reach. Will continue to monitor.

## 2018-01-10 NOTE — Progress Notes (Signed)
Daughter, Trestan Vahle returned this writer's call - confirmed she is the one who cares for her father, but is not the adult child with MPOA, which makes following up with pt care difficult - she also stated her other siblings do not understand pt could use some assistance at home for "minor things before they become major" - informed daughter this Clinical research associate has some literature with information about companies providing home health in order to help pt stay at home -she will obtain literature on 10/23 when she comes by on the way to work - also gave her the number to [facility] Aging to inquire about available services to help pt stay at home and be safe - daughter confirmed pt will definitely refuse to go into a SNF for rehab or assisted living - daughter inquired about asking for Palliative care consult regarding pt having multiple health issues - informed her since she is not the MPOA, she will have to ask the doctor if she could actually make the request - CM will continue to follow.    Colon Branch, RNCM  [facility]  [phone]  Case Management Dept.

## 2018-01-11 LAB — CBC
Absolute NRBC: 0 10*3/uL (ref 0.00–0.00)
Absolute NRBC: 0.03 10*3/uL — ABNORMAL HIGH (ref 0.00–0.00)
Hematocrit: 23.9 % — ABNORMAL LOW (ref 37.6–49.6)
Hematocrit: 27.6 % — ABNORMAL LOW (ref 37.6–49.6)
Hgb: 7.3 g/dL — ABNORMAL LOW (ref 12.5–17.1)
Hgb: 8.5 g/dL — ABNORMAL LOW (ref 12.5–17.1)
MCH: 29.2 pg (ref 25.1–33.5)
MCH: 29.6 pg (ref 25.1–33.5)
MCHC: 30.5 g/dL — ABNORMAL LOW (ref 31.5–35.8)
MCHC: 30.8 g/dL — ABNORMAL LOW (ref 31.5–35.8)
MCV: 95.6 fL (ref 78.0–96.0)
MCV: 96.2 fL — ABNORMAL HIGH (ref 78.0–96.0)
MPV: 10 fL (ref 8.9–12.5)
MPV: 10.1 fL (ref 8.9–12.5)
Nucleated RBC: 0 /100 WBC (ref 0.0–0.0)
Nucleated RBC: 0.4 /100 WBC — ABNORMAL HIGH (ref 0.0–0.0)
Platelets: 219 10*3/uL (ref 142–346)
Platelets: 249 10*3/uL (ref 142–346)
RBC: 2.5 10*6/uL — ABNORMAL LOW (ref 4.20–5.90)
RBC: 2.87 10*6/uL — ABNORMAL LOW (ref 4.20–5.90)
RDW: 18 % — ABNORMAL HIGH (ref 11–15)
RDW: 18 % — ABNORMAL HIGH (ref 11–15)
WBC: 7.22 10*3/uL (ref 3.10–9.50)
WBC: 7.46 10*3/uL (ref 3.10–9.50)

## 2018-01-11 LAB — BASIC METABOLIC PANEL
BUN: 35 mg/dL — ABNORMAL HIGH (ref 9.0–28.0)
BUN: 34 mg/dL — ABNORMAL HIGH (ref 9.0–28.0)
CO2: 19 mEq/L — ABNORMAL LOW (ref 22–29)
CO2: 20 mEq/L — ABNORMAL LOW (ref 22–29)
Calcium: 7.4 mg/dL — ABNORMAL LOW (ref 7.9–10.2)
Calcium: 7.7 mg/dL — ABNORMAL LOW (ref 7.9–10.2)
Chloride: 115 mEq/L — ABNORMAL HIGH (ref 100–111)
Chloride: 112 mEq/L — ABNORMAL HIGH (ref 100–111)
Creatinine: 1.4 mg/dL — ABNORMAL HIGH (ref 0.7–1.3)
Creatinine: 1.5 mg/dL — ABNORMAL HIGH (ref 0.7–1.3)
Glucose: 91 mg/dL (ref 70–100)
Glucose: 85 mg/dL (ref 70–100)
Potassium: 4.7 mEq/L (ref 3.5–5.1)
Potassium: 4.9 mEq/L (ref 3.5–5.1)
Sodium: 139 mEq/L (ref 136–145)
Sodium: 142 mEq/L (ref 136–145)

## 2018-01-11 LAB — PROTEIN ELECTROPHORESIS, URINE
Alpha 1, Urine: 13 %
Alpha 2, Urine: 13.2 %
Beta, Urine: 15.7 %
Protein, UR: <7 mg/dL (ref 1.0–14.0)
Urine Albumin%: 46.6 %
Urine Gamma %: 11.5 %
Urine Pre Albumin%: 0 %

## 2018-01-11 LAB — CROSSMATCH PRBCS, 2 UNITS
Expiration Date: 201911162359
Expiration Date: 201911212359
ISBT CODE: 5100
ISBT CODE: 5100
Status: TRANSFUSED
UTYPE: O POS
UTYPE: O POS

## 2018-01-11 LAB — PROTEIN ELECTROPHORESIS, SERUM
Albumin %: 50.3 % (ref 46.6–62.6)
Albumin, Synovial: 2.6 g/dL — ABNORMAL LOW (ref 3.4–4.8)
Alpha-1 Glob %: 4.9 % — ABNORMAL HIGH (ref 1.7–4.1)
Alpha-1 Globulin: 0.3 g/dL (ref 0.1–0.4)
Alpha-2 Glob %: 14.8 % (ref 8.9–14.9)
Alpha-2 Globulin: 0.8 g/dL (ref 0.8–1.2)
Beta Glob %: 12.7 % (ref 10.9–18.9)
Beta Globulin: 0.7 g/dL (ref 0.6–1.2)
Gamma Globulin %: 17.4 % (ref 9.8–24.4)
Gamma Globulin: 0.9 g/dL (ref 0.6–1.7)
Protein, Total: 5.2 g/dL — ABNORMAL LOW (ref 6.0–8.3)

## 2018-01-11 LAB — HEMOGLOBIN AND HEMATOCRIT, BLOOD
Hematocrit: 28.4 % — ABNORMAL LOW (ref 37.6–49.6)
Hgb: 8.4 g/dL — ABNORMAL LOW (ref 12.5–17.1)

## 2018-01-11 LAB — SEDIMENTATION RATE: Sed Rate: 42 mm/Hr — ABNORMAL HIGH (ref 0–15)

## 2018-01-11 LAB — SERUM PROTEIN ELECTROPHORESIS REVIEW

## 2018-01-11 LAB — GFR
EGFR: 47.7
EGFR: 44.1

## 2018-01-11 LAB — PSA: Prostate Specific Antigen, Total: 203.396 ng/mL — ABNORMAL HIGH (ref 0.000–4.000)

## 2018-01-11 LAB — IFE REVIEW, URINE

## 2018-01-11 LAB — ELECTROPHORESIS REVIEW, URINE

## 2018-01-11 LAB — CEA: CEA: 2.2 ng/mL (ref 0.0–5.0)

## 2018-01-11 LAB — MAGNESIUM: Magnesium: 2 mg/dL (ref 1.6–2.6)

## 2018-01-11 LAB — URINE IMMUNOFIXATION

## 2018-01-11 MED ORDER — DOCUSATE SODIUM 100 MG PO CAPS
100.00 mg | ORAL_CAPSULE | Freq: Every day | ORAL | Status: DC | PRN
Start: 2018-01-11 — End: 2018-01-15
  Administered 2018-01-11 – 2018-01-12 (×2): 100 mg via ORAL
  Filled 2018-01-11 (×2): qty 1

## 2018-01-11 NOTE — Progress Notes (Signed)
01/11/18 1508   Healthcare Decisions   Interviewed: Patient   Interviewee Contact Information: [phone]   Orientation/Decision Making Abilities of Patient Alert and Oriented x3, able to make decisions   Advance Directive Patient does not have advance directive   Prior to admission   Prior level of function Independent with ADLs;Ambulates independently   Type of Residence Private residence   Home Layout Multi-level;Laundry in basement   Have running water, electricity, heat, etc? Yes   Living Arrangements Alone   How do you get to your MD appointments?  Thurston Hole, Daughter   How do you get your groceries?  Thurston Hole, Daughter   Who fixes your meals?  Thurston Hole, Daughter   Who does your laundry?  Thurston Hole, Daughter   Who picks up your prescriptions?  Thurston Hole, Daughter   Dressing Independent   Grooming Independent   Feeding Independent   Bathing Independent   Toileting Independent   DME Currently at Home BP Cuff   Adult Protective Services (APS) involved? No   Discharge Planning   Support Systems Children  (Per daughter, wife in nsg facility with dementia)   Patient expects to be discharged to: Home   Anticipated [place] plan discussed with: Same as interviewed   [place] discussion contact information: [phone]   Mode of transportation: Private car (family member)   Does the patient have perscription coverage? Yes   Consults/Providers   PT Evaluation Needed 2   OT Evalulation Needed 2   SLP Evaluation Needed 2   Correct PCP listed in Epic? Yes   PCP   PCP on file was verified as the current PCP? Yes   Important Message from [facility] Notice   Patient received 1st IMM Letter? Yes   Date of most recent IMM given: 01/07/18        01/11/18 1508   Healthcare Decisions   Interviewed: Patient   Interviewee Contact Information: [phone]   Orientation/Decision Making Abilities of Patient Alert and Oriented x3, able to make decisions   Advance Directive Patient does not have advance directive   Prior to admission   Prior level of function  Independent with ADLs;Ambulates independently   Type of Residence Private residence   Home Layout Multi-level;Laundry in basement   Have running water, electricity, heat, etc? Yes   Living Arrangements Alone   How do you get to your MD appointments?  Thurston Hole, Daughter   How do you get your groceries?  Thurston Hole, Daughter   Who fixes your meals?  Thurston Hole, Daughter   Who does your laundry?  Thurston Hole, Daughter   Who picks up your prescriptions?  Thurston Hole, Daughter   Dressing Independent   Grooming Independent   Feeding Independent   Bathing Independent   Toileting Independent   DME Currently at Home BP Cuff   Adult Protective Services (APS) involved? No   Discharge Planning   Support Systems Children  (Per daughter, wife in nsg facility with dementia)   Patient expects to be discharged to: Home   Anticipated [place] plan discussed with: Same as interviewed   [place] discussion contact information: [phone]   Mode of transportation: Private car (family member)   Does the patient have perscription coverage? Yes   Consults/Providers   PT Evaluation Needed 2   OT Evalulation Needed 2   SLP Evaluation Needed 2   Correct PCP listed in Epic? Yes   PCP   PCP on file was verified as the current PCP? Yes   Important Message from [facility] Notice   Patient received 1st  IMM Letter? Yes   Date of most recent IMM given: 01/07/18     Per H&P: DEMAREA LOREY is a 84 y.o. 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  The patient was recently admitted from 12/03/17-12/12/17 for anemia and GI bleed.    Met with pt at bedside to complete CM assessment - explained CM role -  AAOx4 - did not display any s/sx of intermittent mild confusion noted on Monday (01/09/2018) - reports able to perform all ADL's without assistance - daughter takes pt to all medical appts and errands - reports compliance with all medical appts and medications - denies any  difficulty with obtaining refills/new prescriptions - only home DME is a B/P monitor - denies using community services - does not see need for any post-d/c services at this time - daughter to take pt home upon d/c - plans to return home in self care with support from family as needed - home with no needs noted from CM     [place], [facility]  [facility]  [phone]  Case Management Dept.

## 2018-01-11 NOTE — Progress Notes (Signed)
[name] is a 84 y.o. male patient.  Active Problems:    GI bleed  [name] is a 84 y.o. 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  The patient was recently admitted from 12/03/17-12/12/17 for anemia and GI bleed.  EGD at the time showed many angioectasias of the stomach and duodenum some of which were actively oozing and ablated.  He also had epistaxis which required packing by ENT during that admission.  After admission, he restarted plavix and was doing well up until the last week.  He has been progressively more dizzy and weak and has fallen several times due to his legs buckling.  He says he never lost consciousness, no bowel or bladder incontinence, no tongue biting.  He has noticed dark black stool but is on iron and attributed the dark stools to iron.  He vomited yesterday but no hematemesis.  He denies CP, abdominal pain, SOB.  He has right sided tenderness to palpation due to his fall.  The patient has not taken any medication since Thursday because he hasn't felt well and in fact has stayed in bed for the last few days.   Past Medical History:   Diagnosis Date   . Anemia     iron low   . Black tarry stools 2019   . CKD (chronic kidney disease), stage III    . Coronary artery disease    . DOE (dyspnea on exertion)     mild   . Encounter for blood transfusion    . Glaucoma NEC    . Hyperlipidemia    . Hypertensive disorder    . Macrocytosis    . Paroxysmal A-fib    . Skin cancer     2006 removed mole   . SLE (systemic lupus erythematosus) 1990   . Ulcerative colitis, chronic    . Vision abnormalities      Current Facility-Administered Medications   Medication Dose Route Frequency Provider Last Rate Last Dose   . 0.9 % NaCl with KCl 20 mEq infusion   Intravenous Continuous Amanda Cockayne, MD 75 mL/hr at 01/11/18 1855 75 mL/hr at 01/11/18 1855   . 0.9%  NaCl infusion    Intravenous PRN Cherlyn Cushing, MD       . 0.9%  NaCl infusion   Intravenous PRN Cherlyn Cushing, MD       . acetaminophen (TYLENOL) suppository 650 mg  650 mg Rectal Q6H PRN Cherlyn Cushing, MD   650 mg at 01/07/18 1602   . amiodarone (PACERONE) tablet 100 mg  100 mg Oral Daily Ng, Jared [place], [place]   100 mg at 01/11/18 1026   . atorvastatin (LIPITOR) tablet 80 mg  80 mg Oral QHS Amalia Hailey Bruin, [place]   80 mg at 01/10/18 2120   . dextrose (GLUCOSE) 40 % oral gel 15 g of glucose  15 g of glucose Oral PRN Cherlyn Cushing, MD        And   . dextrose 50 % bolus 12.5 g  12.5 g Intravenous PRN Cherlyn Cushing, MD        And   . glucagon (rDNA) (GLUCAGEN) injection 1 mg  1 mg Intramuscular PRN Cherlyn Cushing, MD       . docusate sodium (COLACE) capsule 100 mg  100 mg Oral Daily PRN Amanda Cockayne,  MD       . fentaNYL (PF) (SUBLIMAZE) injection 25 mcg  25 mcg Intravenous Q2H PRN Cherlyn Cushing, MD       . furosemide (LASIX) tablet 40 mg  40 mg Oral Daily Ng, Jared [place], [place]   40 mg at 01/11/18 1026   . ondansetron (ZOFRAN) injection 4 mg  4 mg Intravenous Q8H PRN Cherlyn Cushing, MD         No Known Allergies  Blood pressure 101/50, pulse 61, temperature 98.2 F (36.8 C), temperature source Oral, resp. rate 19, weight 53.5 kg (117 lb 15.1 oz), SpO2 100 %.    Subjective  Objective:  General Appearance:  Comfortable and in no acute distress.    Vital signs: (most recent): Blood pressure 101/50, pulse 61, temperature 98.2 F (36.8 C), temperature source Oral, resp. rate 19, weight 53.5 kg (117 lb 15.1 oz), SpO2 100 %.  Vital signs are normal.    Output: Producing urine.    HEENT: Normal HEENT exam.    Lungs:  Normal effort and normal respiratory rate.  Breath sounds clear to auscultation.    Heart: Normal rate.  Regular rhythm.  S1 normal.    Abdomen: Abdomen is soft.  Bowel sounds are normal.   There is no abdominal tenderness.     Extremities: Normal range of motion.    Pulses: Distal  pulses are intact.    Neurological: Patient is alert.    Pupils:  Pupils are equal, round, and reactive to light.    Skin:  Warm, dry and pale.      Results     Procedure Component Value Units Date/Time    PSA [[id number]]  (Abnormal) Collected:  01/11/18 0933    Specimen:  Blood Updated:  01/11/18 1915     Prostate Specific Antigen, Total 203.396 ng/mL     IFE Review, Urine [[id number]] Collected:  01/10/18 1518     Updated:  01/11/18 1848     IFE Urine Review See Note    URINE IMMUNOFIXATION [[id number]] Collected:  01/10/18 1517     Updated:  01/11/18 1848     IFE Urine Interp See IFE Review    Electrophoresis Review, Urine [[id number]] Collected:  01/10/18 1518     Updated:  01/11/18 1830     Urine Electrophoresis Review See Note    Protein electrophoresis, urine [[id number]] Collected:  01/10/18 1517    Specimen:  Urine Updated:  01/11/18 1830     Protein, UR <7.0 mg/dL      Urine Pre Albumin% 0.0 %      Urine Albumin% 46.6 %      Alpha 1, Urine 13.0 %      Alpha 2, Urine 13.2 %      Beta, Urine 15.7 %      Urine Gamma % 11.5 %     Serum Protein Electrophoresis Review [[id number]] Collected:  01/10/18 1518     Updated:  01/11/18 1827     Serum Prot Electrophoresis Revwew See Note    Protein electrophoresis, serum [[id number]]  (Abnormal) Collected:  01/10/18 1518    Specimen:  Blood Updated:  01/11/18 1826     Protein, Total 5.2 g/dL      Albumin % 84.1 %      Albumin, Synovial 2.6 g/dL      Alpha-1 Glob % 4.9 %      Alpha-1 Globulin 0.3 g/dL      Alpha-2 Glob % 66.0 %  Alpha-2 Globulin 0.8 g/dL      Beta Glob % 16.1 %      Beta Globulin 0.7 g/dL      Gamma Globulin % 09.6 %      Gamma Globulin 0.9 g/dL     Sedimentation rate (ESR) [[id number]]  (Abnormal) Collected:  01/11/18 1711    Specimen:  Blood Updated:  01/11/18 1810     Sed Rate 42 mm/Hr     Basic Metabolic Panel [[id number]]  (Abnormal) Collected:  01/11/18 1711    Specimen:  Blood Updated:  01/11/18 1740     Glucose 85 mg/dL      BUN 21.3 mg/dL       Creatinine 1.5 mg/dL      Calcium 7.7 mg/dL      Sodium 086 mEq/L      Potassium 4.9 mEq/L      Chloride 112 mEq/L      CO2 20 mEq/L     GFR [[id number]] Collected:  01/11/18 1711     Updated:  01/11/18 1740     EGFR 44.1    CBC without differential [[id number]]  (Abnormal) Collected:  01/11/18 1711    Specimen:  Blood Updated:  01/11/18 1729     WBC 7.46 x10 3/uL      Hgb 8.5 g/dL      Hematocrit 01.0 %      Platelets 249 x10 3/uL      RBC 2.87 x10 6/uL      MCV 96.2 fL      MCH 29.6 pg      MCHC 30.8 g/dL      RDW 18 %      MPV 10.1 fL      Nucleated RBC 0.4 /100 WBC      Absolute NRBC 0.03 x10 3/uL     Cancer antigen 19-9 [[id number]] Collected:  01/11/18 1711    Specimen:  Blood Updated:  01/11/18 1722    CEA [[id number]] Collected:  01/11/18 1711    Specimen:  Blood Updated:  01/11/18 1711    PSA [[id number]] Collected:  01/11/18 1711    Specimen:  Blood Updated:  01/11/18 1711    Hemoglobin and hematocrit, blood [[id number]]  (Abnormal) Collected:  01/11/18 0933    Specimen:  Blood Updated:  01/11/18 1003     Hgb 8.4 g/dL      Hematocrit 60.6 %     Magnesium [[id number]] Collected:  01/11/18 0308    Specimen:  Blood Updated:  01/11/18 0406     Magnesium 2.0 mg/dL     Basic Metabolic Panel [[id number]]  (Abnormal) Collected:  01/11/18 0308    Specimen:  Blood Updated:  01/11/18 0406     Glucose 91 mg/dL      BUN 25.4 mg/dL      Creatinine 1.4 mg/dL      Calcium 7.4 mg/dL      Sodium 270 mEq/L      Potassium 4.7 mEq/L      Chloride 115 mEq/L      CO2 19 mEq/L     GFR [[id number]] Collected:  01/11/18 0308     Updated:  01/11/18 0406     EGFR 47.7    CBC without differential [[id number]]  (Abnormal) Collected:  01/11/18 0309    Specimen:  Blood Updated:  01/11/18 0346     WBC 7.22 x10 3/uL      Hgb 7.3 g/dL      Hematocrit 71.0 %  Platelets 219 x10 3/uL      RBC 2.50 x10 6/uL      MCV 95.6 fL      MCH 29.2 pg      MCHC 30.5 g/dL      RDW 18 %      MPV 10.0 fL      Nucleated RBC 0.0 /100 WBC      Absolute NRBC 0.00 x10  3/uL     RBC Emergency Uncrossmatched [[id number]] Collected:  01/07/18 1103     Updated:  01/11/18 0044     RBC Leukoreduced RBC Leukoreduced     BLUNIT [id number]     Status transfused     PRODUCT CODE (NON READABLE) E0336V00     Expiration Date [id number]     UTYPE O POS     ISBT CODE 5100     RBC Leukoreduced RBC Leukoreduced     BLUNIT [id number]     Status transfused     PRODUCT CODE (NON READABLE) E0336V00     Expiration Date [id number]     UTYPE O POS     ISBT CODE 5100    Prepare/Crossmatch Red Blood Cells:  Two Units [[id number]] Collected:  01/07/18 1103     Updated:  01/11/18 0044     RBC Leukoreduced RBC Leukoreduced     BLUNIT [id number]     Status released     PRODUCT CODE (NON READABLE) E4545V00     Expiration Date [id number]     UTYPE O POS     ISBT CODE 5100     RBC Leukoreduced RBC Leukoreduced     BLUNIT [id number]     Status transfused     PRODUCT CODE (NON READABLE) E0336V00     Expiration Date [id number]     UTYPE O POS     ISBT CODE 5100      Ct Abdomen Pelvis Wo Iv/ Wo Po Contrast    Result Date: 01/09/2018   1. Moderate-sized bilateral pleural effusions with adjacent atelectasis 2. Small left adrenal nodule likely adenoma 3. Multiple small hepatic cysts 4. No gross malignancy or adenopathy identified on this nonenhanced CT J. Carole Binning, MD 01/09/2018 4:40 PM    Ct Head Without Contrast    Result Date: 01/07/2018  1. No acute intracranial hemorrhage is detected. 2. There is mild subcortical, deep, and periventricular leukomalacia in both cerebral hemispheres as well as in the infratentorial white matter.   This is suggestive of chronic small vessel ischemic change. Theodoro Doing, MD 01/07/2018 11:36 AM    Ct Chest Without Contrast    Result Date: 01/07/2018  1. Multiple bilateral pulmonary nodules measuring up to 7 mm in diameter 2. Sclerotic bony abnormality in mid thoracic spine, posterior ribs, and sternum suspicious for metastatic disease J. Carole Binning, MD  01/07/2018 11:55 AM    Nm Bone Scan Whole Body    Result Date: 01/10/2018   Multifocal osseous metastatic disease. Collene Schlichter, MD 01/10/2018 4:51 PM    Assessment & Plan     1. Metastatic Prostate cancer  Elevated PSA, as per Oncology  Positive Bone Scan    2.  GI bleeding  Hold aspirin and Plavix  Last GI work-up was in September 2019 revealing multiple AV malformation each were cauterized GI suspect bleeding from the AVMs    3.  Acute on chronic anemia  Iron TIBC B12 levels  Hematology consult    4.  Multiple bilateral pulmonary nodules  CT  chest showed multiple pulmonary nodules measuring up to 7 mm in diameter and sclerotic bony abnormality in mid thoracic spine posterior ribs sternum suspicious for malignancy  Consult hematology for malignancy work-up    5. AKI   On IV Fluid    6.  History of CAD  Hold aspirin Plavix due to GI bleed    7.   Atrial fibrillation  On amiodarone    8.  Hypertension  Stable    9.  DVT prophylaxis  SCD      Elana Alm [facility]  01/11/2018

## 2018-01-11 NOTE — Progress Notes (Signed)
PROGRESS NOTE    Date Time: 01/11/18 1:15 PM  Patient Name: [name],[name]      Assessment:   1.Lung nodule  2.Anemia:,with  longstanding history mucosal angioectasia/ AVM  3.Bone lesion  4.CAD S/P PCI   5.Afib: AC on hold due to bleed   6.HTN  7.HLD: ON Statin  Plan:   -CT A/P : neg for Malignancy  -Spoke with Radiologist , will plan for Lt Iliac bone mass Bx  -Will also send for PSA level  -Further mgm based on Bx results   -Hb stable/Improving  -NPO midnight  Left a massage for daughter to go over the plan of care   All labs,notes and past 24 hrs events reviewed   Subjective:   Pt was seen at bedside,feels better today, appetite is good, no CP or SOB  Medications:     Current Facility-Administered Medications   Medication Dose Route Frequency   . amiodarone  100 mg Oral Daily   . atorvastatin  80 mg Oral QHS   . furosemide  40 mg Oral Daily       Review of Systems:   A comprehensive review of systems was:  Ten point review of systems negative or as per above endorsements.    Physical Exam:     Vitals:    01/11/18 1150   BP: 103/52   Pulse: 62   Resp: 15   Temp: 98.2 F (36.8 C)   SpO2: 99%       Intake and Output Summary (Last 24 hours) at Date Time    Intake/Output Summary (Last 24 hours) at 01/11/2018 1315  Last data filed at 01/11/2018 1200  Gross per 24 hour   Intake -   Output 1300 ml   Net -1300 ml       General appearance - alert, well appearing, and in no distress  Mental status - alert, oriented to person, place, and time  Eyes - pupils equal and reactive, extraocular eye movements intact  Ears - bilateral TM's and external ear canals normal  Nose - normal and patent, no erythema, discharge or polyps  Mouth - mucous membranes moist, pharynx normal without lesions  Neck - supple, no significant adenopathy  Chest - clear to auscultation, no wheezes, rales or rhonchi, symmetric air entry  Heart - normal rate, regular rhythm, normal S1, S2, no murmurs, rubs, clicks or gallops  Abdomen - soft, nontender,  nondistended, no masses or organomegaly  Neurological - alert, oriented, normal speech, no focal findings or movement disorder noted  Musculoskeletal - no joint tenderness, deformity or swelling  Extremities - peripheral pulses normal, no pedal edema, no clubbing or cyanosis  Skin - normal coloration and turgor, no rashes, no suspicious skin lesions noted    Labs:     Results     Procedure Component Value Units Date/Time    Hemoglobin and hematocrit, blood [[id number]]  (Abnormal) Collected:  01/11/18 0933    Specimen:  Blood Updated:  01/11/18 1003     Hgb 8.4 g/dL      Hematocrit 62.1 %     PSA [[id number]] Collected:  01/11/18 0933    Specimen:  Blood Updated:  01/11/18 0933    Protein electrophoresis, urine [[id number]] Collected:  01/10/18 1517    Specimen:  Urine Updated:  01/11/18 0804     Protein, UR <7.0 mg/dL      Urine Pre Albumin% 0.0 %      Urine Albumin% 46.6 %  Alpha 1, Urine 13.0 %      Alpha 2, Urine 13.2 %      Beta, Urine 15.7 %      Urine Gamma % 11.5 %     Protein electrophoresis, serum [[id number]]  (Abnormal) Collected:  01/10/18 1518    Specimen:  Blood Updated:  01/11/18 0758     Protein, Total 5.2 g/dL      Albumin % 40.9 %      Albumin, Synovial 2.6 g/dL      Alpha-1 Glob % 4.9 %      Alpha-1 Globulin 0.3 g/dL      Alpha-2 Glob % 81.1 %      Alpha-2 Globulin 0.8 g/dL      Beta Glob % 91.4 %      Beta Globulin 0.7 g/dL      Gamma Globulin % 78.2 %      Gamma Globulin 0.9 g/dL     Magnesium [[id number]] Collected:  01/11/18 0308    Specimen:  Blood Updated:  01/11/18 0406     Magnesium 2.0 mg/dL     Basic Metabolic Panel [[id number]]  (Abnormal) Collected:  01/11/18 0308    Specimen:  Blood Updated:  01/11/18 0406     Glucose 91 mg/dL      BUN 52.8 mg/dL      Creatinine 1.4 mg/dL      Calcium 7.4 mg/dL      Sodium 413 mEq/L      Potassium 4.7 mEq/L      Chloride 115 mEq/L      CO2 19 mEq/L     GFR [[id number]] Collected:  01/11/18 0308     Updated:  01/11/18 0406     EGFR 47.7    CBC without  differential [[id number]]  (Abnormal) Collected:  01/11/18 0309    Specimen:  Blood Updated:  01/11/18 0346     WBC 7.22 x10 3/uL      Hgb 7.3 g/dL      Hematocrit 74.2 %      Platelets 219 x10 3/uL      RBC 2.50 x10 6/uL      MCV 95.6 fL      MCH 29.2 pg      MCHC 30.5 g/dL      RDW 18 %      MPV 10.0 fL      Nucleated RBC 0.0 /100 WBC      Absolute NRBC 0.00 x10 3/uL     RBC Emergency Uncrossmatched [[id number]] Collected:  01/07/18 1103     Updated:  01/11/18 0044     RBC Leukoreduced RBC Leukoreduced     BLUNIT [id number]     Status transfused     PRODUCT CODE (NON READABLE) E0336V00     Expiration Date [id number]     UTYPE O POS     ISBT CODE 5100     RBC Leukoreduced RBC Leukoreduced     BLUNIT [id number]     Status transfused     PRODUCT CODE (NON READABLE) E0336V00     Expiration Date [id number]     UTYPE O POS     ISBT CODE 5100    Prepare/Crossmatch Red Blood Cells:  Two Units [[id number]] Collected:  01/07/18 1103     Updated:  01/11/18 0044     RBC Leukoreduced RBC Leukoreduced     BLUNIT [id number]     Status released     PRODUCT CODE (NON READABLE) G2694W54  Expiration Date [id number]     UTYPE O POS     ISBT CODE 5100     RBC Leukoreduced RBC Leukoreduced     BLUNIT [id number]     Status transfused     PRODUCT CODE (NON READABLE) E0336V00     Expiration Date [id number]     UTYPE O POS     ISBT CODE 5100    Immunofixation electrophoresis, Serum [[id number]] Collected:  01/10/18 1518    Specimen:  Blood Updated:  01/10/18 1853    URINE IMMUNOFIXATION [[id number]] Collected:  01/10/18 1517     Updated:  01/10/18 1828    Hemoglobin and hematocrit, blood [[id number]]  (Abnormal) Collected:  01/10/18 1518    Specimen:  Blood Updated:  01/10/18 1547     Hgb 8.3 g/dL      Hematocrit 72.5 %     Free Kappa & Lambda Lt Chains & Ratio, S [[id number]] Collected:  01/10/18 1517    Specimen:  Blood Updated:  01/10/18 1540              Rads:   No results found.  Radiological Procedure reviewed.     Signed by: Jatorian Bellow

## 2018-01-11 NOTE — Plan of Care (Signed)
Problem: Safety  Goal: Patient will be free from injury during hospitalization  Outcome: Progressing  Flowsheets (Taken 01/11/2018 1056)  Patient will be free from injury during hospitalization : Assess patient's risk for falls and implement fall prevention plan of care per policy; Provide and maintain safe environment; Use appropriate transfer methods; Ensure appropriate safety devices are available at the bedside; Include patient/ family/ care giver in decisions related to safety; Hourly rounding; Assess for patients risk for elopement and implement Elopement Risk Plan per policy  Goal: Patient will be free from infection during hospitalization  Outcome: Progressing  Flowsheets (Taken 01/11/2018 1056)  Free from Infection during hospitalization: Assess and monitor for signs and symptoms of infection; Monitor lab/diagnostic results; Monitor all insertion sites (i.e. indwelling lines, tubes, urinary catheters, and drains); Encourage patient and family to use good hand hygiene technique     Problem: Pain  Goal: Pain at adequate level as identified by patient  Outcome: Progressing  Flowsheets (Taken 01/10/2018 2126 by Nancee Liter, RN)  Pain at adequate level as identified by patient: Identify patient comfort function goal;Assess pain on admission, during daily assessment and/or before any "as needed" intervention(s);Reassess pain within 30-60 minutes of any procedure/intervention, per Pain Assessment, Intervention, Reassessment (AIR) Cycle;Include patient/patient care companion in decisions related to pain management as needed     Problem: Side Effects from Pain Analgesia  Goal: Patient will experience minimal side effects of analgesic therapy  Outcome: Progressing  Flowsheets (Taken 01/11/2018 1056)  Patient will experience minimal side effects of analgesic therapy: Monitor/assess patient's respiratory status (RR depth, effort, breath sounds); Assess for changes in cognitive function; Prevent/manage side effects  per LIP orders (i.e. nausea, vomiting, pruritus, constipation, urinary retention, etc.)     Problem: Discharge Barriers  Goal: Patient will be discharged home or other facility with appropriate resources  Outcome: Progressing  Flowsheets (Taken 01/11/2018 1056)  Discharge to home or other facility with appropriate resources: Provide appropriate patient education; Provide information on available health resources     Problem: Psychosocial and Spiritual Needs  Goal: Demonstrates ability to cope with hospitalization/illness  Outcome: Progressing  Flowsheets (Taken 01/11/2018 1056)  Demonstrates ability to cope with hospitalizations/illness: Encourage verbalization of feelings/concerns/expectations; Provide quiet environment     Problem: Moderate/High Fall Risk Score >5  Goal: Patient will remain free of falls  Outcome: Progressing  Flowsheets (Taken 01/11/2018 1000)  High (Greater than 13): HIGH-Consider use of low bed;HIGH-Initiate use of floor mats as appropriate;HIGH-Apply yellow "Fall Risk" arm band;HIGH-Activate bed/chair exit alarm where available     Problem: Compromised Tissue integrity  Goal: Damaged tissue is healing and protected  Outcome: Progressing  Flowsheets (Taken 01/11/2018 1056)  Damaged tissue is healing and protected : Monitor/assess Braden scale every shift; Provide wound care per wound care algorithm; Reposition patient every 2 hours and as needed unless able to reposition self; Increase activity as tolerated/progressive mobility; Keep intact skin clean and dry; Avoid shearing injuries; Relieve pressure to bony prominences for patients at moderate and high risk; Use bath wipes, not soap and water, for daily bathing; Encourage use of lotion/moisturizer on skin; Monitor patient's hygiene practices; Monitor external devices/tubes for correct placement to prevent pressure, friction and shearing; Use incontinence wipes for cleaning urine, stool and caustic drainage. Foley care as needed  Goal:  Nutritional status is improving  Outcome: Progressing  Flowsheets (Taken 01/11/2018 1056)  Nutritional status is improving: Allow adequate time for meals; Assist patient with eating; Include patient/patient care companion in decisions related to nutrition  Problem: Hemodynamic Status: Cardiac  Goal: Stable vital signs and fluid balance  Outcome: Progressing  Flowsheets (Taken 01/10/2018 2126 by Nancee Liter, RN)  Stable vital signs and fluid balance: Monitor lab values;Assess signs and symptoms associated with cardiac rhythm changes     Problem: Inadequate Tissue Perfusion  Goal: Adequate tissue perfusion will be maintained  Outcome: Progressing  Flowsheets (Taken 01/10/2018 2126 by Nancee Liter, RN)  Adequate tissue perfusion will be maintained: Monitor/assess vital signs;Monitor/assess lab values and report abnormal values;Monitor/assess neurovascular status (pulses, capillary refill, pain, paresthesia, paralysis, presence of edema);Monitor intake and output     Problem: Altered GI Function  Goal: Fluid and electrolyte balance are achieved/maintained  Outcome: Progressing  Flowsheets (Taken 01/11/2018 1056)  Fluid and electrolyte balance are achieved/maintained: Monitor intake and output every shift; Monitor/assess lab values and report abnormal values; Provide adequate hydration; Assess for confusion/personality changes; Monitor daily weight; Assess and reassess fluid and electrolyte status  Goal: Elimination patterns are normal or improving  Outcome: Progressing  Flowsheets (Taken 01/10/2018 2126 by Nancee Liter, RN)  Elimination patterns are normal or improving: Report abnormal assessment to physician;Anticipate/assist with toileting needs;Assess for normal bowel sounds;Monitor for abdominal distension;Monitor for abdominal discomfort;Assess for signs and symptoms of bleeding.  Report signs of bleeding to physician  Goal: Nutritional intake is adequate  Outcome: Progressing  Flowsheets (Taken 01/11/2018  1056)  Nutritional intake is adequate: Assist patient with meals/food selection; Allow adequate time for meals; Encourage/perform oral hygiene as appropriate; Assess anorexia, appetite, and amount of meal/food tolerated; Include patient/patient care companion in decisions related to nutrition  Goal: Mobility/Activity is maintained at optimal level for patient  Outcome: Progressing  Flowsheets (Taken 01/10/2018 2126 by Nancee Liter, RN)  Mobility/activity is maintained at optimal level for patient: Increase mobility as tolerated/progressive mobility;Maintain proper body alignment;Plan activities to conserve energy, plan rest periods  Goal: No bleeding  Outcome: Progressing  Flowsheets (Taken 01/10/2018 2126 by Nancee Liter, RN)  No bleeding : Assess for bruising/petechia;Monitor/assess lab values and report abnormal values

## 2018-01-11 NOTE — PT Progress Note (Signed)
Physical Therapy Cancellation Note      Patient:  [name] MRN#:  [id number]  Unit:  [facility] TOWER 9 Room/Bed:  V409/W119.14    01/11/2018  Time: 4:23 PM    Pt not seen for physical therapy secondary to adamant refusal. Pt states he absolutely does not want to participate in physical therapy. Will follow up at a later date/time. Thank you.      Guerry Minors,  PT, DPT  4:23 PM 01/11/2018  Pager [phone]

## 2018-01-11 NOTE — Progress Notes (Signed)
Medicine Shift Note    Braden Scale Score: 20 (01/11/18 1108)  Skin Integrity: Tear, Bruising, Laceration, Redness  Abrasion Skin Location: posterior head  Bruising Skin Location: chest, forearms, right arm  Redness Skin Location: sacrum,back, chest  Patient Lines/Drains/Airways Status    Active Lines, Drains and Airways     Name:   Placement date:   Placement time:   Site:   Days:    Peripheral IV 01/07/18 Left Forearm   01/07/18    -    Forearm   4    Peripheral IV 01/07/18 Right Antecubital   01/07/18    1057    Antecubital   4    Peripheral IV 01/07/18 Left Antecubital   01/07/18    1925    Antecubital   3               Wound 01/07/18 Skin Tear Elbow Left (Active)   Date First Assessed/Time First Assessed: 01/07/18 1400   Wound Type: Skin Tear  Location: Elbow  Wound Location Orientation: Left  Present on Admission: Yes      Assessments 01/07/2018  2:00 PM 01/09/2018  6:30 PM   Site Description Red;Pink Other (Comment)   Peri-wound Description Maroon;Purple;Red Unable to assess   Drainage Amount Scant -   Drainage Description Thin -   Treatments Site care;Cleansed -   Dressing Silicone Adhesive Foam -   Dressing Changed New -   Dressing Status Clean;Dry;Intact -       No Linked orders to display       Wound 01/07/18 Skin Tear Elbow Right (Active)   Date First Assessed/Time First Assessed: 01/07/18 1401   Wound Type: Skin Tear  Location: Elbow  Wound Location Orientation: Right  Present on Admission: Yes      Assessments 01/07/2018  2:00 PM 01/09/2018  6:30 PM   Site Description Pink;Red Other (Comment)   Peri-wound Description Pink;Red;Maroon Unable to assess   Drainage Amount Scant -   Drainage Description Thin -   Treatments Site care;Cleansed -   Dressing Silicone Adhesive Foam -   Dressing Changed New -   Dressing Status Clean;Dry;Intact -       No Linked orders to display       Wound 01/07/18 Traumatic Head Posterior;Upper laceration (Active)   Date First Assessed/Time First Assessed: 01/07/18 1400   Wound  Type: Traumatic  Location: Head  Wound Location Orientation: Posterior;Upper  Wound Description (Comments): laceration  Present on Admission: Yes      Assessments 01/07/2018  2:00 PM 01/09/2018 11:09 PM   Site Description Black;Maroon;Red Black;Maroon   Peri-wound Description Red;Pink Pink;Red   Drainage Amount Scant None   Drainage Description Thin -   Treatments Site care;Cleansed -   Dressing Open to air Cadexomer Iodine Gel;Open to air   Dressing Status Dry;Intact -       No Linked orders to display     Pending Orders: AM labs    Discharge Plan: when medically stable    POC: the pt     Skin: see chart above    Tele: No    Activity: standby assist times 1    PT/OT: 10/23    Interpreter Needs: no    Shift Note: pt A&Ox4, denies pain, H&H 8.4, mypolex placed on patients sacrum to prevent skin breakdown, call bell in reach and fall precautions in place.

## 2018-01-11 NOTE — Progress Notes (Signed)
Medicine Shift Note    Braden Scale Score: 19 (01/10/18 2100)  Skin Integrity: Tear, Bruising, Laceration, Redness  Abrasion Skin Location: posterior head  Bruising Skin Location: chest, forearms, right arm  Redness Skin Location: sacrum, back, chest  Patient Lines/Drains/Airways Status    Active Lines, Drains and Airways     Name:   Placement date:   Placement time:   Site:   Days:    Peripheral IV 01/07/18 Left Forearm   01/07/18    -    Forearm   4    Peripheral IV 01/07/18 Right Antecubital   01/07/18    1057    Antecubital   3    Peripheral IV 01/07/18 Left Antecubital   01/07/18    1925    Antecubital   3               Wound 01/07/18 Skin Tear Elbow Left (Active)   Date First Assessed/Time First Assessed: 01/07/18 1400   Wound Type: Skin Tear  Location: Elbow  Wound Location Orientation: Left  Present on Admission: Yes      Assessments 01/07/2018  2:00 PM 01/09/2018  6:30 PM   Site Description Red;Pink Other (Comment)   Peri-wound Description Maroon;Purple;Red Unable to assess   Drainage Amount Scant -   Drainage Description Thin -   Treatments Site care;Cleansed -   Dressing Silicone Adhesive Foam -   Dressing Changed New -   Dressing Status Clean;Dry;Intact -       No Linked orders to display       Wound 01/07/18 Skin Tear Elbow Right (Active)   Date First Assessed/Time First Assessed: 01/07/18 1401   Wound Type: Skin Tear  Location: Elbow  Wound Location Orientation: Right  Present on Admission: Yes      Assessments 01/07/2018  2:00 PM 01/09/2018  6:30 PM   Site Description Pink;Red Other (Comment)   Peri-wound Description Pink;Red;Maroon Unable to assess   Drainage Amount Scant -   Drainage Description Thin -   Treatments Site care;Cleansed -   Dressing Silicone Adhesive Foam -   Dressing Changed New -   Dressing Status Clean;Dry;Intact -       No Linked orders to display       Wound 01/07/18 Traumatic Head Posterior;Upper laceration (Active)   Date First Assessed/Time First Assessed: 01/07/18 1400   Wound  Type: Traumatic  Location: Head  Wound Location Orientation: Posterior;Upper  Wound Description (Comments): laceration  Present on Admission: Yes      Assessments 01/07/2018  2:00 PM 01/09/2018 11:09 PM   Site Description Black;Maroon;Red Black;Maroon   Peri-wound Description Red;Pink Pink;Red   Drainage Amount Scant None   Drainage Description Thin -   Treatments Site care;Cleansed -   Dressing Open to air Cadexomer Iodine Gel;Open to air   Dressing Status Dry;Intact -       No Linked orders to display       Last BM: 10/18    Pending Orders: Labs, H&H    Discharge Plan: once medically cleared    POC: patient    Skin: see above     Tele: No     Activity: bed mobility this shift     PT/OT: needs evaluation     Interpreter Needs: No    Shift Note: Pt A&Ox4, denies pain, BP low, H&H trending downwards, fall precautions in place, call bell within reach. Will continue to monitor.

## 2018-01-11 NOTE — OT Eval Note (Signed)
[facility]   Occupational Therapy Evaluation     Patient: [name]    MRN#: [id number]   Unit: [facility] TOWER 9  Bed: O130/Q657.84                                     Discharge Recommendations:   Discharge Recommendation: Home with no needs   DME Recommended for Discharge: (none)    Assessment:   [name] is a 84 y.o. male admitted 01/07/2018.   During the OT eval today, the pt completed supine to sit, scooting to EOB, sit to stand, lower body dressing (donning socks seated EOB), upper body dressing, simulated toileting, and ambulating ~100 feet independently. He is not presenting with functional deficits that would warrant the need for OT services and he appears to be at his baseline level of functioning for ADLs. As such, OT will sign off and recommend the pt return home at discharge.    Assessment: Appears to be at baseline for ADL's     Treatment Activities: OT evaluation     Educated the patient to role of occupational therapy, occupational therapy evaluation process, and discontinuation of OT services.    Plan:   OT Frequency Recommended: therapy discontinued     Treatment/Interventions: no further OT treatment needs identified         Consult received for [name] for OT Evaluation and Treatment.  Patient's medical condition is appropriate for Occupational Therapy intervention at this time.      History of Present Illness:   [name] is a 84 y.o. 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  The patient was recently admitted from 12/03/17-12/12/17 for anemia and GI bleed.  EGD at the time showed many angioectasias of the stomach and duodenum some of which were actively oozing and ablated.  He also had epistaxis which required packing by ENT during that admission.  After admission, he restarted plavix and was doing well up until the last week.   He has been progressively more dizzy and weak and has fallen several times due to his legs buckling.  He says he never lost consciousness, no bowel or bladder incontinence, no tongue biting.  He has noticed dark black stool but is on iron and attributed the dark stools to iron.  He vomited yesterday but no hematemesis.  He denies CP, abdominal pain, SOB.  He has right sided tenderness to palpation due to his fall.  The patient has not taken any medication since Thursday because he hasn't felt well and in fact has stayed in bed for the last few days.     Admitting Diagnosis: GI bleed [K92.2]  Anemia, unspecified type [D64.9]  Gastrointestinal hemorrhage, unspecified gastrointestinal hemorrhage type [K92.2]    Past Medical/Surgical History:  Past Medical History:   Diagnosis Date   . Anemia     iron low   . Black tarry stools 2019   . CKD (chronic kidney disease), stage III    . Coronary artery disease    . DOE (dyspnea on exertion)     mild   . Encounter for blood transfusion    . Glaucoma NEC    . Hyperlipidemia    . Hypertensive disorder    . Macrocytosis    .  Paroxysmal A-fib    . Skin cancer     2006 removed mole   . SLE (systemic lupus erythematosus) 1990   . Ulcerative colitis, chronic    . Vision abnormalities        Imaging/Tests/Labs:  Ct Abdomen Pelvis Wo Iv/ Wo Po Contrast    Result Date: 01/09/2018   1. Moderate-sized bilateral pleural effusions with adjacent atelectasis 2. Small left adrenal nodule likely adenoma 3. Multiple small hepatic cysts 4. No gross malignancy or adenopathy identified on this nonenhanced CT J. Carole Binning, MD 01/09/2018 4:40 PM    Ct Head Without Contrast    Result Date: 01/07/2018  1. No acute intracranial hemorrhage is detected. 2. There is mild subcortical, deep, and periventricular leukomalacia in both cerebral hemispheres as well as in the infratentorial white matter.   This is suggestive of chronic small vessel ischemic change. Theodoro Doing, MD 01/07/2018 11:36  AM    Ct Chest Without Contrast    Result Date: 01/07/2018  1. Multiple bilateral pulmonary nodules measuring up to 7 mm in diameter 2. Sclerotic bony abnormality in mid thoracic spine, posterior ribs, and sternum suspicious for metastatic disease J. Carole Binning, MD 01/07/2018 11:55 AM    Nm Bone Scan Whole Body    Result Date: 01/10/2018   Multifocal osseous metastatic disease. Collene Schlichter, MD 01/10/2018 4:51 PM      Social History:   Prior Level of Function: Independent with ADLs, household ambulation, community ambulation and household chores including doing the yard work. He does not drive.  Assistive Devices: none  DME Currently at Home: none  Home Living Arrangements: The pt lives alone.  Type of Home: House  Home Layout: 3 level home, including a basement. The pt's bedroom is upstairs.    Subjective:    Patient is agreeable to participation in the therapy session. Nursing clears patient for therapy.     Pain:   The pt denied pain.    Objective:   Patient is in bed with IV access in place.    Cognitive Status and Neuro Exam:  Alert, cooperative, pleasant, oriented x4, able to problem solve and follow commands independently.    Musculoskeletal Examination  RUE ROM: AROM WFL  LUE ROM: AROM WFL  RLE ROM: AROM WFL  LLE ROM: AROM WFL    RUE Strength: WFL  LUE Strength: WFL  RLE Strength: WFL  LLE Strength: WFL      Sensory/Oculomotor Examination  Auditory: WFL  Tactile: The pt denied having numbness and tingling of B UE and B LE.  Vision: The pt wears corrective lenses.      Activities of Daily Living  Eating: Independent  Grooming: Independent  Bathing: not assessed  UE Dressing: Independent  LE Dressing: Independent  Toileting: Independent    Functional Mobility:  Supine to Sit: Independent  Sit to Stand: Independent    PMP Activity: Step 7 - Walks out of Room (independent with ambulating ~100 feet)     Balance  Static Sitting: normal  Dynamic Sitting: normal  Static Standing: normal  Dynamic Standing:  good    Participation and Activity Tolerance  Participation Effort: good  Endurance: good    Patient left with call bell within reach, all needs met, SCDs N/A, fall mat in place, bed alarm N/A, chair N/A, RN in room and all questions answered. RN notified of session outcome and patient response.       Time of treatment:   OT Received  On: 01/11/18  Start Time: 1255  Stop Time: 1320  Time Calculation (min): 25 min         Marella Bile, OTR/L  Pager [phone]

## 2018-01-12 ENCOUNTER — Ambulatory Visit: Payer: Self-pay

## 2018-01-12 ENCOUNTER — Inpatient Hospital Stay: Payer: Medicare Other

## 2018-01-12 ENCOUNTER — Other Ambulatory Visit: Payer: 59

## 2018-01-12 DIAGNOSIS — C7951 Secondary malignant neoplasm of bone: Secondary | ICD-10-CM

## 2018-01-12 DIAGNOSIS — C61 Malignant neoplasm of prostate: Secondary | ICD-10-CM

## 2018-01-12 LAB — BASIC METABOLIC PANEL
BUN: 34 mg/dL — ABNORMAL HIGH (ref 9.0–28.0)
CO2: 19 mEq/L — ABNORMAL LOW (ref 22–29)
Calcium: 7.7 mg/dL — ABNORMAL LOW (ref 7.9–10.2)
Chloride: 112 mEq/L — ABNORMAL HIGH (ref 100–111)
Creatinine: 1.3 mg/dL (ref 0.7–1.3)
Glucose: 84 mg/dL (ref 70–100)
Potassium: 4.9 mEq/L (ref 3.5–5.1)
Sodium: 137 mEq/L (ref 136–145)

## 2018-01-12 LAB — CBC
Absolute NRBC: 0.03 10*3/uL — ABNORMAL HIGH (ref 0.00–0.00)
Hematocrit: 25.4 % — ABNORMAL LOW (ref 37.6–49.6)
Hgb: 7.7 g/dL — ABNORMAL LOW (ref 12.5–17.1)
MCH: 29.4 pg (ref 25.1–33.5)
MCHC: 30.3 g/dL — ABNORMAL LOW (ref 31.5–35.8)
MCV: 96.9 fL — ABNORMAL HIGH (ref 78.0–96.0)
MPV: 10.1 fL (ref 8.9–12.5)
Nucleated RBC: 0.4 /100 WBC — ABNORMAL HIGH (ref 0.0–0.0)
Platelets: 223 10*3/uL (ref 142–346)
RBC: 2.62 10*6/uL — ABNORMAL LOW (ref 4.20–5.90)
RDW: 18 % — ABNORMAL HIGH (ref 11–15)
WBC: 7.88 10*3/uL (ref 3.10–9.50)

## 2018-01-12 LAB — FREE KAPPA & LAMBDA LIGHT CHAINS PLUS RATIO, QUANTITATIVE, SERUM
Free Kappa, Serum: 43.3 mg/L — ABNORMAL HIGH (ref 3.3–19.4)
Free Kappa/Lambda Ratio: 1.13 (ref 0.26–1.65)
Free Lambda, Serum: 38.2 mg/L — ABNORMAL HIGH (ref 5.7–26.3)

## 2018-01-12 LAB — MAGNESIUM: Magnesium: 2 mg/dL (ref 1.6–2.6)

## 2018-01-12 LAB — CANCER ANTIGEN 19-9: CA 19-9: 11 (ref <34)

## 2018-01-12 LAB — GFR: EGFR: 52

## 2018-01-12 LAB — PSA: Prostate Specific Antigen, Total: 211.603 ng/mL — ABNORMAL HIGH (ref 0.000–4.000)

## 2018-01-12 MED ORDER — FENTANYL CITRATE (PF) 50 MCG/ML IJ SOLN (WRAP)
INTRAMUSCULAR | Status: AC
Start: 2018-01-12 — End: ?
  Filled 2018-01-12: qty 4

## 2018-01-12 MED ORDER — MIDAZOLAM HCL 2 MG/2ML IJ SOLN
INTRAMUSCULAR | Status: AC | PRN
Start: 2018-01-12 — End: 2018-01-12
  Administered 2018-01-12: .5 mg via INTRAVENOUS

## 2018-01-12 MED ORDER — BICALUTAMIDE 50 MG PO TABS
50.00 mg | ORAL_TABLET | Freq: Every day | ORAL | Status: DC
Start: 2018-01-12 — End: 2018-01-15
  Administered 2018-01-12 – 2018-01-15 (×4): 50 mg via ORAL
  Filled 2018-01-12 (×5): qty 1

## 2018-01-12 MED ORDER — FENTANYL CITRATE (PF) 50 MCG/ML IJ SOLN (WRAP)
INTRAMUSCULAR | Status: AC | PRN
Start: 2018-01-12 — End: 2018-01-12
  Administered 2018-01-12: 25 ug via INTRAVENOUS

## 2018-01-12 MED ORDER — FLUMAZENIL 0.5 MG/5ML IV SOLN
INTRAVENOUS | Status: AC
Start: 2018-01-12 — End: ?
  Filled 2018-01-12: qty 5

## 2018-01-12 MED ORDER — NALOXONE HCL 0.4 MG/ML IJ SOLN (WRAP)
INTRAMUSCULAR | Status: AC
Start: 2018-01-12 — End: ?
  Filled 2018-01-12: qty 1

## 2018-01-12 MED ORDER — ONDANSETRON HCL 4 MG/2ML IJ SOLN
INTRAMUSCULAR | Status: AC
Start: 2018-01-12 — End: ?
  Filled 2018-01-12: qty 2

## 2018-01-12 MED ORDER — MIDAZOLAM HCL 2 MG/2ML IJ SOLN
INTRAMUSCULAR | Status: AC
Start: 2018-01-12 — End: ?
  Filled 2018-01-12: qty 4

## 2018-01-12 MED ORDER — LIDOCAINE HCL 1 % IJ SOLN
INTRAMUSCULAR | Status: AC | PRN
Start: 2018-01-12 — End: 2018-01-12
  Administered 2018-01-12: 10 mL

## 2018-01-12 NOTE — PT Progress Note (Signed)
[facility]   Physical Therapy Cancellation Note      Patient:  [name] MRN#:  [id number]  Unit:  [facility] TOWER 9 Room/Bed:  Y782/N562.13    01/12/2018  Time: 2:22 PM      Pt not seen for physical therapy secondary to off unit for CT scan. Will follow up as appropriate and as schedule permits.    Neal Dy, PT, DPT  Pager # [phone]

## 2018-01-12 NOTE — Progress Notes (Signed)
Medicine Shift Note    Braden Scale Score: 19 (01/12/18 1100)  Skin Integrity: Tear, Bruising, Laceration, Redness  Abrasion Skin Location: posterior head  Bruising Skin Location: chest, forearms, right arm  Redness Skin Location: sacrum, back, chest  Patient Lines/Drains/Airways Status    Active Lines, Drains and Airways     Name:   Placement date:   Placement time:   Site:   Days:    Peripheral IV 01/07/18 Left Forearm   01/07/18    -    Forearm   5    Peripheral IV 01/07/18 Right Antecubital   01/07/18    1057    Antecubital   5    Peripheral IV 01/07/18 Left Antecubital   01/07/18    1925    Antecubital   5               Wound 01/07/18 Skin Tear Elbow Left (Active)   Date First Assessed/Time First Assessed: 01/07/18 1400   Wound Type: Skin Tear  Location: Elbow  Wound Location Orientation: Left  Present on Admission: Yes      Assessments 01/07/2018  2:00 PM 01/09/2018  6:30 PM   Site Description Red;Pink Other (Comment)   Peri-wound Description Maroon;Purple;Red Unable to assess   Drainage Amount Scant -   Drainage Description Thin -   Treatments Site care;Cleansed -   Dressing Silicone Adhesive Foam -   Dressing Changed New -   Dressing Status Clean;Dry;Intact -       No Linked orders to display       Wound 01/07/18 Skin Tear Elbow Right (Active)   Date First Assessed/Time First Assessed: 01/07/18 1401   Wound Type: Skin Tear  Location: Elbow  Wound Location Orientation: Right  Present on Admission: Yes      Assessments 01/07/2018  2:00 PM 01/09/2018  6:30 PM   Site Description Pink;Red Other (Comment)   Peri-wound Description Pink;Red;Maroon Unable to assess   Drainage Amount Scant -   Drainage Description Thin -   Treatments Site care;Cleansed -   Dressing Silicone Adhesive Foam -   Dressing Changed New -   Dressing Status Clean;Dry;Intact -       No Linked orders to display       Wound 01/07/18 Traumatic Head Posterior;Upper laceration (Active)   Date First Assessed/Time First Assessed: 01/07/18 1400   Wound  Type: Traumatic  Location: Head  Wound Location Orientation: Posterior;Upper  Wound Description (Comments): laceration  Present on Admission: Yes      Assessments 01/07/2018  2:00 PM 01/09/2018 11:09 PM   Site Description Black;Maroon;Red Black;Maroon   Peri-wound Description Red;Pink Pink;Red   Drainage Amount Scant None   Drainage Description Thin -   Treatments Site care;Cleansed -   Dressing Open to air Cadexomer Iodine Gel;Open to air   Dressing Status Dry;Intact -       No Linked orders to display       Last BM:10/23    Pending Orders: AM labs    Discharge Plan: when medically stable    POC: the patient    Skin: See chart above    Tele: no    Activity: standby assist times 1    PT/OT: 10/23    Interpreter Needs: No    Shift Note: A&Ox4, denies pain, CT guided bone biopsy performed this shift, call bell in reach, fall precautions in place and hourly rounding was maintained.

## 2018-01-12 NOTE — Sedation Documentation (Addendum)
Patient arrived to CT from recovery. Threshold pause completed. Pt transferred to table and placed lateral. Attached to monitor and O2. Padded arm boards in place and warm blankets applied. Pt marked by Dr Margarite Gouge.     CT guided left iliac mass biopsy completed by Dr Margarite Gouge with 3 passes with no complications. Site CDI. Please keep dressing on for 24 hours.     Post procedure, patient transferred back to stretcher, VSS. Report called to 9th floor and patient transferred back to room.     Total meds: 1.5mg  versed and fentanyl.

## 2018-01-12 NOTE — Progress Notes (Signed)
PROGRESS NOTE    Date Time: 01/12/18 3:58 PM  Patient Name: [name],[name]      Assessment:   1.Lung nodule  2.Anemia:,with  longstanding history mucosal angioectasia/ AVM  3.Bone lesion  4.CAD S/P PCI   5.Afib: AC on hold due to bleed   6.HTN  7.HLD: ON Statin  Plan:   -CT A/P : neg for Malignancy  -S/P Lt Iliac bone Bx  -PSA elevated favoring Met Prostate CA  -Will start pt on Casodex  -Further mgm based on Bx results   -Hb Dropped slightly, will cont to monitor the trend   Cont plan of care    All labs,notes and past 24 hrs events reviewed   Subjective:   Pt was seen at bedside,feels better today, appetite is good, no CP or SOB  Medications:     Current Facility-Administered Medications   Medication Dose Route Frequency   . amiodarone  100 mg Oral Daily   . atorvastatin  80 mg Oral QHS   . bicalutamide  50 mg Oral Daily   . furosemide  40 mg Oral Daily       Review of Systems:   A comprehensive review of systems was:  Ten point review of systems negative or as per above endorsements.    Physical Exam:     Vitals:    01/12/18 1506   BP: 111/57   Pulse: (!) 56   Resp: 16   Temp: 97.9 F (36.6 C)   SpO2: 99%       Intake and Output Summary (Last 24 hours) at Date Time    Intake/Output Summary (Last 24 hours) at 01/12/2018 1558  Last data filed at 01/11/2018 2329  Gross per 24 hour   Intake -   Output 350 ml   Net -350 ml       General appearance - alert, well appearing, and in no distress  Mental status - alert, oriented to person, place, and time  Eyes - pupils equal and reactive, extraocular eye movements intact  Ears - bilateral TM's and external ear canals normal  Nose - normal and patent, no erythema, discharge or polyps  Mouth - mucous membranes moist, pharynx normal without lesions  Neck - supple, no significant adenopathy  Chest - clear to auscultation, no wheezes, rales or rhonchi, symmetric air entry  Heart - normal rate, regular rhythm, normal S1, S2, no murmurs, rubs, clicks or gallops  Abdomen - soft,  nontender, nondistended, no masses or organomegaly  Neurological - alert, oriented, normal speech, no focal findings or movement disorder noted  Musculoskeletal - no joint tenderness, deformity or swelling  Extremities - peripheral pulses normal, no pedal edema, no clubbing or cyanosis  Skin - normal coloration and turgor, no rashes, no suspicious skin lesions noted    Labs:     Results     Procedure Component Value Units Date/Time    GFR [[id number]] Collected:  01/12/18 0456     Updated:  01/12/18 0559     EGFR 52.0    Magnesium [[id number]] Collected:  01/12/18 0456    Specimen:  Blood Updated:  01/12/18 0559     Magnesium 2.0 mg/dL     Basic Metabolic Panel [[id number]]  (Abnormal) Collected:  01/12/18 0456    Specimen:  Blood Updated:  01/12/18 0559     Glucose 84 mg/dL      BUN 46.9 mg/dL      Creatinine 1.3 mg/dL      Calcium 7.7 mg/dL  Sodium 137 mEq/L      Potassium 4.9 mEq/L      Chloride 112 mEq/L      CO2 19 mEq/L     CBC without differential [[id number]]  (Abnormal) Collected:  01/12/18 0456    Specimen:  Blood Updated:  01/12/18 0524     WBC 7.88 x10 3/uL      Hgb 7.7 g/dL      Hematocrit 65.7 %      Platelets 223 x10 3/uL      RBC 2.62 x10 6/uL      MCV 96.9 fL      MCH 29.4 pg      MCHC 30.3 g/dL      RDW 18 %      MPV 10.1 fL      Nucleated RBC 0.4 /100 WBC      Absolute NRBC 0.03 x10 3/uL     Free Kappa & Lambda Lt Chains & Ratio, S [[id number]]  (Abnormal) Collected:  01/10/18 1517    Specimen:  Blood Updated:  01/12/18 0038     Free Kappa, Serum 43.3 mg/L      Free Lambda, Serum 38.2 mg/L      Free Kappa/Lambda Ratio 1.13    PSA [[id number]]  (Abnormal) Collected:  01/11/18 1711    Specimen:  Blood Updated:  01/12/18 0028     Prostate Specific Antigen, Total 211.603 ng/mL     CEA [[id number]] Collected:  01/11/18 1711    Specimen:  Blood Updated:  01/11/18 2341     CEA 2.2 ng/mL     PSA [[id number]]  (Abnormal) Collected:  01/11/18 0933    Specimen:  Blood Updated:  01/11/18 1915     Prostate  Specific Antigen, Total 203.396 ng/mL     IFE Review, Urine [[id number]] Collected:  01/10/18 1518     Updated:  01/11/18 1848     IFE Urine Review See Note    URINE IMMUNOFIXATION [[id number]] Collected:  01/10/18 1517     Updated:  01/11/18 1848     IFE Urine Interp See IFE Review    Electrophoresis Review, Urine [[id number]] Collected:  01/10/18 1518     Updated:  01/11/18 1830     Urine Electrophoresis Review See Note    Protein electrophoresis, urine [[id number]] Collected:  01/10/18 1517    Specimen:  Urine Updated:  01/11/18 1830     Protein, UR <7.0 mg/dL      Urine Pre Albumin% 0.0 %      Urine Albumin% 46.6 %      Alpha 1, Urine 13.0 %      Alpha 2, Urine 13.2 %      Beta, Urine 15.7 %      Urine Gamma % 11.5 %     Serum Protein Electrophoresis Review [[id number]] Collected:  01/10/18 1518     Updated:  01/11/18 1827     Serum Prot Electrophoresis Revwew See Note    Protein electrophoresis, serum [[id number]]  (Abnormal) Collected:  01/10/18 1518    Specimen:  Blood Updated:  01/11/18 1826     Protein, Total 5.2 g/dL      Albumin % 15.1 %      Albumin, Synovial 2.6 g/dL      Alpha-1 Glob % 4.9 %      Alpha-1 Globulin 0.3 g/dL      Alpha-2 Glob % 76.1 %      Alpha-2 Globulin 0.8 g/dL      Beta Glob %  12.7 %      Beta Globulin 0.7 g/dL      Gamma Globulin % 81.1 %      Gamma Globulin 0.9 g/dL     Sedimentation rate (ESR) [[id number]]  (Abnormal) Collected:  01/11/18 1711    Specimen:  Blood Updated:  01/11/18 1810     Sed Rate 42 mm/Hr     Basic Metabolic Panel [[id number]]  (Abnormal) Collected:  01/11/18 1711    Specimen:  Blood Updated:  01/11/18 1740     Glucose 85 mg/dL      BUN 46.9 mg/dL      Creatinine 1.5 mg/dL      Calcium 7.7 mg/dL      Sodium 629 mEq/L      Potassium 4.9 mEq/L      Chloride 112 mEq/L      CO2 20 mEq/L     GFR [[id number]] Collected:  01/11/18 1711     Updated:  01/11/18 1740     EGFR 44.1    CBC without differential [[id number]]  (Abnormal) Collected:  01/11/18 1711    Specimen:  Blood  Updated:  01/11/18 1729     WBC 7.46 x10 3/uL      Hgb 8.5 g/dL      Hematocrit 64.4 %      Platelets 249 x10 3/uL      RBC 2.87 x10 6/uL      MCV 96.2 fL      MCH 29.6 pg      MCHC 30.8 g/dL      RDW 18 %      MPV 10.1 fL      Nucleated RBC 0.4 /100 WBC      Absolute NRBC 0.03 x10 3/uL     Cancer antigen 19-9 [[id number]] Collected:  01/11/18 1711    Specimen:  Blood Updated:  01/11/18 1722              Rads:   No results found.  Radiological Procedure reviewed.    Signed by: Knute Bellow

## 2018-01-12 NOTE — Plan of Care (Signed)
Problem: Safety  Goal: Patient will be free from injury during hospitalization  Outcome: Progressing  Flowsheets (Taken 01/12/2018 0232)  Patient will be free from injury during hospitalization : Assess patient's risk for falls and implement fall prevention plan of care per policy; Provide and maintain safe environment; Include patient/ family/ care giver in decisions related to safety; Hourly rounding  Goal: Patient will be free from infection during hospitalization  Outcome: Progressing  Flowsheets (Taken 01/12/2018 0232)  Free from Infection during hospitalization: Assess and monitor for signs and symptoms of infection; Monitor lab/diagnostic results; Monitor all insertion sites (i.e. indwelling lines, tubes, urinary catheters, and drains)     Problem: Pain  Goal: Pain at adequate level as identified by patient  Outcome: Progressing  Flowsheets (Taken 01/10/2018 2126)  Pain at adequate level as identified by patient: Identify patient comfort function goal;Assess pain on admission, during daily assessment and/or before any "as needed" intervention(s);Reassess pain within 30-60 minutes of any procedure/intervention, per Pain Assessment, Intervention, Reassessment (AIR) Cycle;Include patient/patient care companion in decisions related to pain management as needed     Problem: Side Effects from Pain Analgesia  Goal: Patient will experience minimal side effects of analgesic therapy  Outcome: Progressing  Flowsheets (Taken 01/12/2018 0232)  Patient will experience minimal side effects of analgesic therapy: Monitor/assess patient's respiratory status (RR depth, effort, breath sounds); Assess for changes in cognitive function; Prevent/manage side effects per LIP orders (i.e. nausea, vomiting, pruritus, constipation, urinary retention, etc.)     Problem: Discharge Barriers  Goal: Patient will be discharged home or other facility with appropriate resources  Outcome: Progressing  Flowsheets (Taken 01/12/2018  0232)  Discharge to home or other facility with appropriate resources: Provide appropriate patient education     Problem: Psychosocial and Spiritual Needs  Goal: Demonstrates ability to cope with hospitalization/illness  Outcome: Progressing  Flowsheets (Taken 01/12/2018 0232)  Demonstrates ability to cope with hospitalizations/illness: Encourage verbalization of feelings/concerns/expectations; Provide quiet environment; Include patient/ patient care companion in decisions     Problem: Moderate/High Fall Risk Score >5  Goal: Patient will remain free of falls  Outcome: Progressing  Flowsheets (Taken 01/12/2018 0200)  High (Greater than 13): HIGH-Activate bed/chair exit alarm where available;HIGH-Apply yellow "Fall Risk" arm band;HIGH-Initiate use of floor mats as appropriate     Problem: Compromised Tissue integrity  Goal: Damaged tissue is healing and protected  Outcome: Progressing  Flowsheets (Taken 01/12/2018 0232)  Damaged tissue is healing and protected : Monitor/assess Braden scale every shift; Avoid shearing injuries; Keep intact skin clean and dry; Increase activity as tolerated/progressive mobility  Goal: Nutritional status is improving  Outcome: Progressing  Flowsheets (Taken 01/12/2018 0232)  Nutritional status is improving: Allow adequate time for meals; Include patient/patient care companion in decisions related to nutrition     Problem: Hemodynamic Status: Cardiac  Goal: Stable vital signs and fluid balance  Outcome: Progressing  Flowsheets (Taken 01/10/2018 2126)  Stable vital signs and fluid balance: Monitor lab values;Assess signs and symptoms associated with cardiac rhythm changes     Problem: Inadequate Tissue Perfusion  Goal: Adequate tissue perfusion will be maintained  Outcome: Progressing  Flowsheets (Taken 01/10/2018 2126)  Adequate tissue perfusion will be maintained: Monitor/assess vital signs;Monitor/assess lab values and report abnormal values;Monitor/assess neurovascular status (pulses,  capillary refill, pain, paresthesia, paralysis, presence of edema);Monitor intake and output

## 2018-01-12 NOTE — Plan of Care (Signed)
Problem: Safety  Goal: Patient will be free from injury during hospitalization  Outcome: Progressing  Flowsheets (Taken 01/12/2018 0232 by Nancee Liter, RN)  Patient will be free from injury during hospitalization : Assess patient's risk for falls and implement fall prevention plan of care per policy;Provide and maintain safe environment;Include patient/ family/ care giver in decisions related to safety;Hourly rounding  Goal: Patient will be free from infection during hospitalization  Outcome: Progressing  Flowsheets (Taken 01/12/2018 0833 by Reina Fuse, RN)  Free from Infection during hospitalization: Assess and monitor for signs and symptoms of infection;Monitor lab/diagnostic results;Monitor all insertion sites (i.e. indwelling lines, tubes, urinary catheters, and drains)     Problem: Pain  Goal: Pain at adequate level as identified by patient  Outcome: Progressing  Flowsheets (Taken 01/10/2018 2126 by Nancee Liter, RN)  Pain at adequate level as identified by patient: Identify patient comfort function goal;Assess pain on admission, during daily assessment and/or before any "as needed" intervention(s);Reassess pain within 30-60 minutes of any procedure/intervention, per Pain Assessment, Intervention, Reassessment (AIR) Cycle;Include patient/patient care companion in decisions related to pain management as needed     Problem: Side Effects from Pain Analgesia  Goal: Patient will experience minimal side effects of analgesic therapy  Outcome: Progressing  Flowsheets (Taken 01/12/2018 0833 by Reina Fuse, RN)  Patient will experience minimal side effects of analgesic therapy: Monitor/assess patient's respiratory status (RR depth, effort, breath sounds);Assess for changes in cognitive function;Prevent/manage side effects per LIP orders (i.e. nausea, vomiting, pruritus, constipation, urinary retention, etc.)     Problem: Discharge Barriers  Goal: Patient will be discharged home or other facility with appropriate  resources  Outcome: Progressing  Flowsheets (Taken 01/12/2018 0833 by Reina Fuse, RN)  Discharge to home or other facility with appropriate resources: Provide appropriate patient education;Provide information on available health resources     Problem: Psychosocial and Spiritual Needs  Goal: Demonstrates ability to cope with hospitalization/illness  Outcome: Progressing  Flowsheets (Taken 01/12/2018 0232 by Nancee Liter, RN)  Demonstrates ability to cope with hospitalizations/illness: Encourage verbalization of feelings/concerns/expectations;Provide quiet environment;Include patient/ patient care companion in decisions     Problem: Moderate/High Fall Risk Score >5  Goal: Patient will remain free of falls  Outcome: Progressing  Flowsheets  Taken 01/10/2018 1119 by Blair Promise, RN  VH Moderate Risk (6-13): Use of floor mat;YELLOW NON-SKID SLIPPERS  Taken 01/12/2018 2345 by Odella Aquas, RN  VH High Risk (Greater than 13): ALL REQUIRED LOW INTERVENTIONS;ALL REQUIRED MODERATE INTERVENTIONS;RED "HIGH FALL RISK" SIGNAGE;BED ALARM WILL BE ACTIVATED WHEN THE PATEINT IS IN BED WITH SIGNAGE "RESET BED ALARM";A CHAIR PAD ALARM WILL BE USED WHEN PATIENT IS UP SITTING IN A CHAIR;PATIENT IS TO BE SUPERVISED FOR ALL TOILETING ACTIVITIES;A safety companion may be used when deemed appropriate by the Primary RN and Clinical Administrator;Keep door open for better visibility;Include family/significant other in multidisciplinary discussion regarding plan of care as appropriate;Request PT/OT therapy consult order from physician for patients with gait/mobility impairment;Use assistive devices;Use chair-pad alarm device;Use of floor mat;Use of roll guard;Use of "STOP ask for help" sign     Problem: Compromised Tissue integrity  Goal: Damaged tissue is healing and protected  Outcome: Progressing  Flowsheets (Taken 01/12/2018 0232 by Nancee Liter, RN)  Damaged tissue is healing and protected : Monitor/assess Braden scale every shift;Avoid  shearing injuries;Keep intact skin clean and dry;Increase activity as tolerated/progressive mobility  Goal: Nutritional status is improving  Outcome: Progressing  Flowsheets (Taken 01/12/2018 0833 by Reina Fuse, RN)  Nutritional status is  improving: Assist patient with eating;Allow adequate time for meals;Include patient/patient care companion in decisions related to nutrition     Problem: Hemodynamic Status: Cardiac  Goal: Stable vital signs and fluid balance  Outcome: Progressing  Flowsheets (Taken 01/10/2018 2126 by Nancee Liter, RN)  Stable vital signs and fluid balance: Monitor lab values;Assess signs and symptoms associated with cardiac rhythm changes     Problem: Inadequate Tissue Perfusion  Goal: Adequate tissue perfusion will be maintained  Outcome: Progressing     Problem: Altered GI Function  Goal: Fluid and electrolyte balance are achieved/maintained  Outcome: Progressing  Flowsheets (Taken 01/11/2018 1056 by Reina Fuse, RN)  Fluid and electrolyte balance are achieved/maintained: Monitor intake and output every shift;Monitor/assess lab values and report abnormal values;Provide adequate hydration;Assess for confusion/personality changes;Monitor daily weight;Assess and reassess fluid and electrolyte status  Goal: Elimination patterns are normal or improving  Outcome: Progressing  Goal: Nutritional intake is adequate  Outcome: Progressing  Flowsheets (Taken 01/11/2018 1056 by Reina Fuse, RN)  Nutritional intake is adequate: Assist patient with meals/food selection;Allow adequate time for meals;Encourage/perform oral hygiene as appropriate;Assess anorexia, appetite, and amount of meal/food tolerated;Include patient/patient care companion in decisions related to nutrition  Goal: Mobility/Activity is maintained at optimal level for patient  Outcome: Progressing  Goal: No bleeding  Outcome: Progressing  Flowsheets (Taken 01/10/2018 2126 by Nancee Liter, RN)  No bleeding : Assess for  bruising/petechia;Monitor/assess lab values and report abnormal values

## 2018-01-12 NOTE — Plan of Care (Signed)
Problem: Safety  Goal: Patient will be free from injury during hospitalization  Outcome: Progressing  Flowsheets (Taken 01/12/2018 0232 by Nancee Liter, RN)  Patient will be free from injury during hospitalization : Assess patient's risk for falls and implement fall prevention plan of care per policy;Provide and maintain safe environment;Include patient/ family/ care giver in decisions related to safety;Hourly rounding  Goal: Patient will be free from infection during hospitalization  Outcome: Progressing  Flowsheets (Taken 01/12/2018 0833)  Free from Infection during hospitalization: Assess and monitor for signs and symptoms of infection; Monitor lab/diagnostic results; Monitor all insertion sites (i.e. indwelling lines, tubes, urinary catheters, and drains)     Problem: Pain  Goal: Pain at adequate level as identified by patient  Outcome: Progressing  Flowsheets (Taken 01/10/2018 2126 by Nancee Liter, RN)  Pain at adequate level as identified by patient: Identify patient comfort function goal;Assess pain on admission, during daily assessment and/or before any "as needed" intervention(s);Reassess pain within 30-60 minutes of any procedure/intervention, per Pain Assessment, Intervention, Reassessment (AIR) Cycle;Include patient/patient care companion in decisions related to pain management as needed     Problem: Side Effects from Pain Analgesia  Goal: Patient will experience minimal side effects of analgesic therapy  Outcome: Progressing  Flowsheets (Taken 01/12/2018 1610)  Patient will experience minimal side effects of analgesic therapy: Monitor/assess patient's respiratory status (RR depth, effort, breath sounds); Assess for changes in cognitive function; Prevent/manage side effects per LIP orders (i.e. nausea, vomiting, pruritus, constipation, urinary retention, etc.)     Problem: Discharge Barriers  Goal: Patient will be discharged home or other facility with appropriate resources  Outcome:  Progressing  Flowsheets (Taken 01/12/2018 [phone])  Discharge to home or other facility with appropriate resources: Provide appropriate patient education; Provide information on available health resources     Problem: Psychosocial and Spiritual Needs  Goal: Demonstrates ability to cope with hospitalization/illness  Outcome: Progressing  Flowsheets (Taken 01/12/2018 0232 by Nancee Liter, RN)  Demonstrates ability to cope with hospitalizations/illness: Encourage verbalization of feelings/concerns/expectations;Provide quiet environment;Include patient/ patient care companion in decisions     Problem: Moderate/High Fall Risk Score >5  Goal: Patient will remain free of falls  Outcome: Progressing  Flowsheets (Taken 01/12/2018 0800)  High (Greater than 13): HIGH-Initiate use of floor mats as appropriate;HIGH-Activate bed/chair exit alarm where available;HIGH-Consider use of low bed;HIGH-Apply yellow "Fall Risk" arm band     Problem: Compromised Tissue integrity  Goal: Damaged tissue is healing and protected  Outcome: Progressing  Flowsheets (Taken 01/12/2018 0232 by Nancee Liter, RN)  Damaged tissue is healing and protected : Monitor/assess Braden scale every shift;Avoid shearing injuries;Keep intact skin clean and dry;Increase activity as tolerated/progressive mobility  Goal: Nutritional status is improving  Outcome: Progressing  Flowsheets (Taken 01/12/2018 0833)  Nutritional status is improving: Assist patient with eating; Allow adequate time for meals; Include patient/patient care companion in decisions related to nutrition     Problem: Hemodynamic Status: Cardiac  Goal: Stable vital signs and fluid balance  Outcome: Progressing  Flowsheets (Taken 01/10/2018 2126 by Nancee Liter, RN)  Stable vital signs and fluid balance: Monitor lab values;Assess signs and symptoms associated with cardiac rhythm changes     Problem: Inadequate Tissue Perfusion  Goal: Adequate tissue perfusion will be maintained  Outcome:  Progressing  Flowsheets (Taken 01/10/2018 2126 by Nancee Liter, RN)  Adequate tissue perfusion will be maintained: Monitor/assess vital signs;Monitor/assess lab values and report abnormal values;Monitor/assess neurovascular status (pulses, capillary refill, pain, paresthesia, paralysis, presence of edema);Monitor intake and output  Problem: Altered GI Function  Goal: Fluid and electrolyte balance are achieved/maintained  Outcome: Progressing  Flowsheets (Taken 01/11/2018 1056)  Fluid and electrolyte balance are achieved/maintained: Monitor intake and output every shift;Monitor/assess lab values and report abnormal values;Provide adequate hydration;Assess for confusion/personality changes;Monitor daily weight;Assess and reassess fluid and electrolyte status  Goal: Elimination patterns are normal or improving  Outcome: Progressing  Flowsheets (Taken 01/10/2018 2126 by Nancee Liter, RN)  Elimination patterns are normal or improving: Report abnormal assessment to physician;Anticipate/assist with toileting needs;Assess for normal bowel sounds;Monitor for abdominal distension;Monitor for abdominal discomfort;Assess for signs and symptoms of bleeding.  Report signs of bleeding to physician  Goal: Nutritional intake is adequate  Outcome: Progressing  Flowsheets (Taken 01/11/2018 1056)  Nutritional intake is adequate: Assist patient with meals/food selection;Allow adequate time for meals;Encourage/perform oral hygiene as appropriate;Assess anorexia, appetite, and amount of meal/food tolerated;Include patient/patient care companion in decisions related to nutrition  Goal: Mobility/Activity is maintained at optimal level for patient  Outcome: Progressing  Flowsheets (Taken 01/10/2018 2126 by Nancee Liter, RN)  Mobility/activity is maintained at optimal level for patient: Increase mobility as tolerated/progressive mobility;Maintain proper body alignment;Plan activities to conserve energy, plan rest periods  Goal: No  bleeding  Outcome: Progressing  Flowsheets (Taken 01/10/2018 2126 by Nancee Liter, RN)  No bleeding : Assess for bruising/petechia;Monitor/assess lab values and report abnormal values

## 2018-01-12 NOTE — H&P (Signed)
BRIEF PRE PROCEDURE H&P      PROCEDURE:     CT guieded biopsy of the left iliac bone    INDICATIONS:     Sclerotic bone lesions    REVIEW OF SYSTEMS:   YES  (X )         ALLERGIES:     NO  ( x )   YES  (  )    PHYSICAL EXAM     AIRWAY CLASSIFICATION:    CLASS I   (  )     CLASS II  (x  )    CLASS III  (  )     CLASS IV  (  )    CARDIAC :   (x  )  RRR  (  )  IRREG  (  )  MURMUR    LUNGS:   ( x )  CLEAR  (  )  DIMINISHED    (  ) LEFT   (  )  RIGHT  (  )  ABSENT          (  ) LEFT   (  )  RIGHT  (  )  TUBES            (  ) LEFT   (  )  RIGHT      ABDOMEN:   Soft, nontender      NEURO:   Alert and oriented X 3      OTHER:   none    LABS:     Lab Results   Component Value Date/Time    WBC 7.88 01/12/2018 04:56 AM    WBC 6.98 02/21/2008 09:35 AM    HCT 25.4 (L) 01/12/2018 04:56 AM    PLT 223 01/12/2018 04:56 AM    INR 1.2 (H) 01/07/2018 11:03 AM    PT 14.9 01/07/2018 11:03 AM    PTT 27 01/07/2018 11:03 AM    BUN 34.0 (H) 01/12/2018 04:56 AM    GLU 84 01/12/2018 04:56 AM    K 4.9 01/12/2018 04:56 AM           ASA PHYSICAL STATUS   (  )  ASA 1   HEALTHY PATIENT  (  x)  ASA 2   MILD SYSTEMIC ILLNESS  (  )  ASA 3   SYSTEMIC DISEASE, NOT INCAPACITATING  (  )  ASA 4   SEVERE SYSTEMIC DISEASE, DISEASE IS CONSTANT THREAT TO                         LIFE  (  )  ASA 5   MORIBUND CONDITION, NOT EXPECTED TO LIVE >24 HOURS            IRRESPECTIVE OF PROCEDURE  (  )  E           EMERGENCY PROCEDURE       PLANNED SEDATION:   (  ) NO SEDATION  (  x) MODERATE SEDATION  (  ) DEEP SEDATION WITH ANESTHESIA    Yes ( ) No (x ) Previous anesthesia reaction.  CONCLUSION:   PATIENT HAS BEEN REASSESSED IMMEDIATELY PRIOR TO THE PROCEDURE   AND IS AN APPROPRIATE CANDIDATE FOR THE PLANNED SEDATION AND   PROCEDURE.  RISKS, BENEFITS AND ALTERNATIVES TO THE PLANNED   PROCEDURE AND SEDATION HAVE BEEN EXPLAINED TO THE PATIENT   OR GUARDIAN.    ( x )  YES  (  )  EMERGENCY CONSENT         Signed by Renita Papa.

## 2018-01-12 NOTE — Progress Notes (Signed)
[name] is a 84 y.o. male patient.  Active Problems:    GI bleed  [name] is a 84 y.o. 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  The patient was recently admitted from 12/03/17-12/12/17 for anemia and GI bleed.  EGD at the time showed many angioectasias of the stomach and duodenum some of which were actively oozing and ablated.  He also had epistaxis which required packing by ENT during that admission.  After admission, he restarted plavix and was doing well up until the last week.  He has been progressively more dizzy and weak and has fallen several times due to his legs buckling.  He says he never lost consciousness, no bowel or bladder incontinence, no tongue biting.  He has noticed dark black stool but is on iron and attributed the dark stools to iron.  He vomited yesterday but no hematemesis.  He denies CP, abdominal pain, SOB.  He has right sided tenderness to palpation due to his fall.  The patient has not taken any medication since Thursday because he hasn't felt well and in fact has stayed in bed for the last few days.   Past Medical History:   Diagnosis Date   . Anemia     iron low   . Black tarry stools 2019   . CKD (chronic kidney disease), stage III    . Coronary artery disease    . DOE (dyspnea on exertion)     mild   . Encounter for blood transfusion    . Glaucoma NEC    . Hyperlipidemia    . Hypertensive disorder    . Macrocytosis    . Paroxysmal A-fib    . Skin cancer     2006 removed mole   . SLE (systemic lupus erythematosus) 1990   . Ulcerative colitis, chronic    . Vision abnormalities      Current Facility-Administered Medications   Medication Dose Route Frequency Provider Last Rate Last Dose   . 0.9 % NaCl with KCl 20 mEq infusion   Intravenous Continuous Amanda Cockayne, MD 75 mL/hr at 01/11/18 1855 75 mL/hr at 01/11/18 1855   . 0.9%  NaCl infusion    Intravenous PRN Cherlyn Cushing, MD       . 0.9%  NaCl infusion   Intravenous PRN Cherlyn Cushing, MD       . acetaminophen (TYLENOL) suppository 650 mg  650 mg Rectal Q6H PRN Cherlyn Cushing, MD   650 mg at 01/07/18 1602   . amiodarone (PACERONE) tablet 100 mg  100 mg Oral Daily Ng, Jared [place], PA   100 mg at 01/11/18 1026   . atorvastatin (LIPITOR) tablet 80 mg  80 mg Oral QHS Amalia Hailey [place], [place]   80 mg at 01/11/18 2118   . bicalutamide (CASODEX) tablet 50 mg  50 mg Oral Daily Sahebi, Camila, MD       . dextrose (GLUCOSE) 40 % oral gel 15 g of glucose  15 g of glucose Oral PRN Cherlyn Cushing, MD        And   . dextrose 50 % bolus 12.5 g  12.5 g Intravenous PRN Cherlyn Cushing, MD        And   . glucagon (rDNA) (GLUCAGEN) injection 1 mg  1 mg Intramuscular PRN Cherlyn Cushing, MD       .  docusate sodium (COLACE) capsule 100 mg  100 mg Oral Daily PRN Amanda Cockayne, MD   100 mg at 01/12/18 0818   . fentaNYL (PF) (SUBLIMAZE) injection 25 mcg  25 mcg Intravenous Q2H PRN Cherlyn Cushing, MD       . furosemide (LASIX) tablet 40 mg  40 mg Oral Daily Ng, Jared [place], [place]   40 mg at 01/11/18 1026   . ondansetron (ZOFRAN) injection 4 mg  4 mg Intravenous Q8H PRN Cherlyn Cushing, MD         No Known Allergies  Blood pressure 107/48, pulse 65, temperature 97.7 F (36.5 C), temperature source Oral, resp. rate 16, weight 53.5 kg (117 lb 15.1 oz), SpO2 99 %.    Subjective  Objective:  General Appearance:  Comfortable and in no acute distress.    Vital signs: (most recent): Blood pressure 107/48, pulse 65, temperature 97.7 F (36.5 C), temperature source Oral, resp. rate 16, weight 53.5 kg (117 lb 15.1 oz), SpO2 99 %.  Vital signs are normal.    Output: Producing urine.    HEENT: Normal HEENT exam.    Lungs:  Normal effort and normal respiratory rate.  Breath sounds clear to auscultation.    Heart: Normal rate.  Regular rhythm.  S1 normal.    Abdomen: Abdomen is soft.  Bowel sounds are  normal.   There is no abdominal tenderness.     Extremities: Normal range of motion.    Pulses: Distal pulses are intact.    Neurological: Patient is alert.    Pupils:  Pupils are equal, round, and reactive to light.    Skin:  Warm, dry and pale.      Results     Procedure Component Value Units Date/Time    GFR [[id number]] Collected:  01/12/18 0456     Updated:  01/12/18 0559     EGFR 52.0    Magnesium [[id number]] Collected:  01/12/18 0456    Specimen:  Blood Updated:  01/12/18 0559     Magnesium 2.0 mg/dL     Basic Metabolic Panel [[id number]]  (Abnormal) Collected:  01/12/18 0456    Specimen:  Blood Updated:  01/12/18 0559     Glucose 84 mg/dL      BUN 96.2 mg/dL      Creatinine 1.3 mg/dL      Calcium 7.7 mg/dL      Sodium 952 mEq/L      Potassium 4.9 mEq/L      Chloride 112 mEq/L      CO2 19 mEq/L     CBC without differential [[id number]]  (Abnormal) Collected:  01/12/18 0456    Specimen:  Blood Updated:  01/12/18 0524     WBC 7.88 x10 3/uL      Hgb 7.7 g/dL      Hematocrit 02.7 %      Platelets 223 x10 3/uL      RBC 2.62 x10 6/uL      MCV 96.9 fL      MCH 29.4 pg      MCHC 30.3 g/dL      RDW 18 %      MPV 10.1 fL      Nucleated RBC 0.4 /100 WBC      Absolute NRBC 0.03 x10 3/uL     Free Kappa & Lambda Lt Chains & Ratio, S [[id number]]  (Abnormal) Collected:  01/10/18 1517    Specimen:  Blood Updated:  01/12/18 0038     Free  Kappa, Serum 43.3 mg/L      Free Lambda, Serum 38.2 mg/L      Free Kappa/Lambda Ratio 1.13    PSA [[id number]]  (Abnormal) Collected:  01/11/18 1711    Specimen:  Blood Updated:  01/12/18 0028     Prostate Specific Antigen, Total 211.603 ng/mL     CEA [[id number]] Collected:  01/11/18 1711    Specimen:  Blood Updated:  01/11/18 2341     CEA 2.2 ng/mL     PSA [[id number]]  (Abnormal) Collected:  01/11/18 0933    Specimen:  Blood Updated:  01/11/18 1915     Prostate Specific Antigen, Total 203.396 ng/mL     IFE Review, Urine [[id number]] Collected:  01/10/18 1518     Updated:  01/11/18 1848     IFE  Urine Review See Note    URINE IMMUNOFIXATION [[id number]] Collected:  01/10/18 1517     Updated:  01/11/18 1848     IFE Urine Interp See IFE Review    Electrophoresis Review, Urine [[id number]] Collected:  01/10/18 1518     Updated:  01/11/18 1830     Urine Electrophoresis Review See Note    Protein electrophoresis, urine [[id number]] Collected:  01/10/18 1517    Specimen:  Urine Updated:  01/11/18 1830     Protein, UR <7.0 mg/dL      Urine Pre Albumin% 0.0 %      Urine Albumin% 46.6 %      Alpha 1, Urine 13.0 %      Alpha 2, Urine 13.2 %      Beta, Urine 15.7 %      Urine Gamma % 11.5 %     Serum Protein Electrophoresis Review [[id number]] Collected:  01/10/18 1518     Updated:  01/11/18 1827     Serum Prot Electrophoresis Revwew See Note    Protein electrophoresis, serum [[id number]]  (Abnormal) Collected:  01/10/18 1518    Specimen:  Blood Updated:  01/11/18 1826     Protein, Total 5.2 g/dL      Albumin % 60.6 %      Albumin, Synovial 2.6 g/dL      Alpha-1 Glob % 4.9 %      Alpha-1 Globulin 0.3 g/dL      Alpha-2 Glob % 30.1 %      Alpha-2 Globulin 0.8 g/dL      Beta Glob % 60.1 %      Beta Globulin 0.7 g/dL      Gamma Globulin % 09.3 %      Gamma Globulin 0.9 g/dL     Sedimentation rate (ESR) [[id number]]  (Abnormal) Collected:  01/11/18 1711    Specimen:  Blood Updated:  01/11/18 1810     Sed Rate 42 mm/Hr       Ct Abdomen Pelvis Wo Iv/ Wo Po Contrast    Result Date: 01/09/2018   1. Moderate-sized bilateral pleural effusions with adjacent atelectasis 2. Small left adrenal nodule likely adenoma 3. Multiple small hepatic cysts 4. No gross malignancy or adenopathy identified on this nonenhanced CT J. Carole Binning, MD 01/09/2018 4:40 PM    Ct Head Without Contrast    Result Date: 01/07/2018  1. No acute intracranial hemorrhage is detected. 2. There is mild subcortical, deep, and periventricular leukomalacia in both cerebral hemispheres as well as in the infratentorial white matter.   This is suggestive of chronic small  vessel ischemic change. Theodoro Doing, MD 01/07/2018 11:36 AM    Ct  Chest Without Contrast    Result Date: 01/07/2018  1. Multiple bilateral pulmonary nodules measuring up to 7 mm in diameter 2. Sclerotic bony abnormality in mid thoracic spine, posterior ribs, and sternum suspicious for metastatic disease J. Carole Binning, MD 01/07/2018 11:55 AM    Nm Bone Scan Whole Body    Result Date: 01/10/2018   Multifocal osseous metastatic disease. Collene Schlichter, MD 01/10/2018 4:51 PM    Ct Guided Biopsy Bone Deep Pq Ndl    Result Date: 01/12/2018   CT guided biopsy of left iliac bone sclerotic lesion. Emeline Darling, MD 01/12/2018 4:16 PM    Assessment & Plan     1. Metastatic Prostate cancer  For CT guided Biopsy today, Elevated PSA, as per Oncology  Positive Bone Scan    2.  GI bleeding  Hold aspirin and Plavix  Last GI work-up was in September 2019 revealing multiple AV malformation each were cauterized GI suspect bleeding from the AVMs    3.  Acute on chronic anemia  Iron TIBC B12 levels  Hematology consult    4.  Multiple bilateral pulmonary nodules  CT chest showed multiple pulmonary nodules measuring up to 7 mm in diameter and sclerotic bony abnormality in mid thoracic spine posterior ribs sternum suspicious for malignancy  Consult hematology for malignancy work-up    5. AKI   On IV Fluid    6.  History of CAD  Hold aspirin Plavix due to GI bleed    7.   Atrial fibrillation  On amiodarone    8.  Hypertension  Stable    9.  DVT prophylaxis  SCD      Elana Alm [facility]  01/12/2018

## 2018-01-12 NOTE — Procedures (Signed)
BRIEF POST- PROCEDURE NOTE      PROCEDURE:   CT guided biopsy of left iliac bone sclerotic lesion    ANESTHESIA:   Moderate sedation      POST-OPERATIVE DIAGNOSIS:     Sclerotic lesion in the left Iliac bone    OPERATIVE FINDINGS:   Sclerotic lesion in the left Iliac bone    SPECIMENS REMOVED:     3 bone core and one aspiration    TOTAL CONTRAST VOLUME:     0cc    ESTIMATED BLOOD LOSS:     1cc    COMPLICATIONS:     None immediate.      Signed by Renita Papa.

## 2018-01-12 NOTE — Progress Notes (Signed)
Medicine Shift Note    Braden Scale Score: 19 (01/12/18 0200)  Skin Integrity: Tear, Bruising, Laceration, Redness  Abrasion Skin Location: posterior head  Bruising Skin Location: chest, forearms, right arm  Redness Skin Location: sacrum,back, chest  Patient Lines/Drains/Airways Status    Active Lines, Drains and Airways     Name:   Placement date:   Placement time:   Site:   Days:    Peripheral IV 01/07/18 Left Forearm   01/07/18    -    Forearm   5    Peripheral IV 01/07/18 Right Antecubital   01/07/18    1057    Antecubital   4    Peripheral IV 01/07/18 Left Antecubital   01/07/18    1925    Antecubital   4               Wound 01/07/18 Skin Tear Elbow Left (Active)   Date First Assessed/Time First Assessed: 01/07/18 1400   Wound Type: Skin Tear  Location: Elbow  Wound Location Orientation: Left  Present on Admission: Yes      Assessments 01/07/2018  2:00 PM 01/09/2018  6:30 PM   Site Description Red;Pink Other (Comment)   Peri-wound Description Maroon;Purple;Red Unable to assess   Drainage Amount Scant -   Drainage Description Thin -   Treatments Site care;Cleansed -   Dressing Silicone Adhesive Foam -   Dressing Changed New -   Dressing Status Clean;Dry;Intact -       No Linked orders to display       Wound 01/07/18 Skin Tear Elbow Right (Active)   Date First Assessed/Time First Assessed: 01/07/18 1401   Wound Type: Skin Tear  Location: Elbow  Wound Location Orientation: Right  Present on Admission: Yes      Assessments 01/07/2018  2:00 PM 01/09/2018  6:30 PM   Site Description Pink;Red Other (Comment)   Peri-wound Description Pink;Red;Maroon Unable to assess   Drainage Amount Scant -   Drainage Description Thin -   Treatments Site care;Cleansed -   Dressing Silicone Adhesive Foam -   Dressing Changed New -   Dressing Status Clean;Dry;Intact -       No Linked orders to display       Wound 01/07/18 Traumatic Head Posterior;Upper laceration (Active)   Date First Assessed/Time First Assessed: 01/07/18 1400   Wound  Type: Traumatic  Location: Head  Wound Location Orientation: Posterior;Upper  Wound Description (Comments): laceration  Present on Admission: Yes      Assessments 01/07/2018  2:00 PM 01/09/2018 11:09 PM   Site Description Black;Maroon;Red Black;Maroon   Peri-wound Description Red;Pink Pink;Red   Drainage Amount Scant None   Drainage Description Thin -   Treatments Site care;Cleansed -   Dressing Open to air Cadexomer Iodine Gel;Open to air   Dressing Status Dry;Intact -       No Linked orders to display         Last BM:10/18, stool softener ordered     Pending Orders:Labs     Discharge Plan:once medically cleared    ZOX:[id number]    Skin:see above     Tele:No     Activity:SBA     PT/OT:10/23; home with no needs     Interpreter Needs:No    Shift Note:Pt A&Ox4, denies pain, NPO since midnight, plan for CT guided biopsy of bone, fall precautions in place, call bell within reach. Will continue to monitor.

## 2018-01-13 LAB — BASIC METABOLIC PANEL
BUN: 33 mg/dL — ABNORMAL HIGH (ref 9.0–28.0)
BUN: 31 mg/dL — ABNORMAL HIGH (ref 9.0–28.0)
CO2: 18 mEq/L — ABNORMAL LOW (ref 22–29)
CO2: 18 mEq/L — ABNORMAL LOW (ref 22–29)
Calcium: 7.4 mg/dL — ABNORMAL LOW (ref 7.9–10.2)
Calcium: 7.5 mg/dL — ABNORMAL LOW (ref 7.9–10.2)
Chloride: 117 mEq/L — ABNORMAL HIGH (ref 100–111)
Chloride: 116 mEq/L — ABNORMAL HIGH (ref 100–111)
Creatinine: 1.4 mg/dL — ABNORMAL HIGH (ref 0.7–1.3)
Creatinine: 1.2 mg/dL (ref 0.7–1.3)
Glucose: 91 mg/dL (ref 70–100)
Glucose: 100 mg/dL (ref 70–100)
Potassium: 5.5 mEq/L — ABNORMAL HIGH (ref 3.5–5.1)
Potassium: 5.4 mEq/L — ABNORMAL HIGH (ref 3.5–5.1)
Sodium: 140 mEq/L (ref 136–145)
Sodium: 140 mEq/L (ref 136–145)

## 2018-01-13 LAB — GFR
EGFR: 47.7
EGFR: 57

## 2018-01-13 LAB — CBC
Absolute NRBC: 0 10*3/uL (ref 0.00–0.00)
Hematocrit: 23.7 % — ABNORMAL LOW (ref 37.6–49.6)
Hgb: 7 g/dL — ABNORMAL LOW (ref 12.5–17.1)
MCH: 29.5 pg (ref 25.1–33.5)
MCHC: 29.5 g/dL — ABNORMAL LOW (ref 31.5–35.8)
MCV: 100 fL — ABNORMAL HIGH (ref 78.0–96.0)
MPV: 10.4 fL (ref 8.9–12.5)
Nucleated RBC: 0 /100 WBC (ref 0.0–0.0)
Platelets: 230 10*3/uL (ref 142–346)
RBC: 2.37 10*6/uL — ABNORMAL LOW (ref 4.20–5.90)
RDW: 18 % — ABNORMAL HIGH (ref 11–15)
WBC: 6.22 10*3/uL (ref 3.10–9.50)

## 2018-01-13 LAB — MAGNESIUM: Magnesium: 2 mg/dL (ref 1.6–2.6)

## 2018-01-13 MED ORDER — SODIUM POLYSTYRENE SULFONATE 15 GM/60ML PO SUSP
15.00 g | Freq: Once | ORAL | Status: AC
Start: 2018-01-13 — End: 2018-01-13
  Administered 2018-01-13: 15:00:00 15 g via ORAL
  Filled 2018-01-13: qty 60

## 2018-01-13 NOTE — Progress Notes (Signed)
[name] is a 84 y.o. male patient.  Active Problems:    GI bleed  ZUBAIR LOFTON is a 84 y.o. 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  The patient was recently admitted from 12/03/17-12/12/17 for anemia and GI bleed.  EGD at the time showed many angioectasias of the stomach and duodenum some of which were actively oozing and ablated.  He also had epistaxis which required packing by ENT during that admission.  After admission, he restarted plavix and was doing well up until the last week.  He has been progressively more dizzy and weak and has fallen several times due to his legs buckling.  He says he never lost consciousness, no bowel or bladder incontinence, no tongue biting.  He has noticed dark black stool but is on iron and attributed the dark stools to iron.  He vomited yesterday but no hematemesis.  He denies CP, abdominal pain, SOB.  He has right sided tenderness to palpation due to his fall.  The patient has not taken any medication since Thursday because he hasn't felt well and in fact has stayed in bed for the last few days.   Past Medical History:   Diagnosis Date   . Anemia     iron low   . Black tarry stools 2019   . CKD (chronic kidney disease), stage III    . Coronary artery disease    . DOE (dyspnea on exertion)     mild   . Encounter for blood transfusion    . Glaucoma NEC    . Hyperlipidemia    . Hypertensive disorder    . Macrocytosis    . Paroxysmal A-fib    . Skin cancer     2006 removed mole   . SLE (systemic lupus erythematosus) 1990   . Ulcerative colitis, chronic    . Vision abnormalities      Current Facility-Administered Medications   Medication Dose Route Frequency Provider Last Rate Last Dose   . 0.9 % NaCl with KCl 20 mEq infusion   Intravenous Continuous Amanda Cockayne, MD 75 mL/hr at 01/13/18 0554 75 mL/hr at 01/13/18 0554   . 0.9%  NaCl infusion    Intravenous PRN Cherlyn Cushing, MD       . 0.9%  NaCl infusion   Intravenous PRN Cherlyn Cushing, MD       . acetaminophen (TYLENOL) suppository 650 mg  650 mg Rectal Q6H PRN Cherlyn Cushing, MD   650 mg at 01/07/18 1602   . amiodarone (PACERONE) tablet 100 mg  100 mg Oral Daily Ng, Jared [place], PA   100 mg at 01/11/18 1026   . atorvastatin (LIPITOR) tablet 80 mg  80 mg Oral QHS [place], [place]   80 mg at 01/12/18 2226   . bicalutamide (CASODEX) tablet 50 mg  50 mg Oral Daily Haven [place], MD   50 mg at 01/12/18 1853   . dextrose (GLUCOSE) 40 % oral gel 15 g of glucose  15 g of glucose Oral PRN Cherlyn Cushing, MD        And   . dextrose 50 % bolus 12.5 g  12.5 g Intravenous PRN Cherlyn Cushing, MD        And   . glucagon (rDNA) (GLUCAGEN) injection 1 mg  1 mg Intramuscular PRN Cherlyn Cushing,  MD       . docusate sodium (COLACE) capsule 100 mg  100 mg Oral Daily PRN Amanda Cockayne, MD   100 mg at 01/12/18 0818   . fentaNYL (PF) (SUBLIMAZE) injection 25 mcg  25 mcg Intravenous Q2H PRN Cherlyn Cushing, MD       . furosemide (LASIX) tablet 40 mg  40 mg Oral Daily Ng, Jared [place], [place]   40 mg at 01/11/18 1026   . ondansetron (ZOFRAN) injection 4 mg  4 mg Intravenous Q8H PRN Cherlyn Cushing, MD         No Known Allergies  Blood pressure 102/53, pulse 60, temperature 98.3 F (36.8 C), temperature source Oral, resp. rate 16, weight 53.5 kg (117 lb 15.1 oz), SpO2 98 %.    Subjective  Objective:  General Appearance:  Comfortable and in no acute distress.    Vital signs: (most recent): Blood pressure 102/53, pulse 60, temperature 98.3 F (36.8 C), temperature source Oral, resp. rate 16, weight 53.5 kg (117 lb 15.1 oz), SpO2 98 %.  Vital signs are normal.    Output: Producing urine.    HEENT: Normal HEENT exam.    Lungs:  Normal effort and normal respiratory rate.  Breath sounds clear to auscultation.    Heart: Normal rate.  Regular rhythm.  S1 normal.    Abdomen: Abdomen is  soft.  Bowel sounds are normal.   There is no abdominal tenderness.     Extremities: Normal range of motion.    Pulses: Distal pulses are intact.    Neurological: Patient is alert.    Pupils:  Pupils are equal, round, and reactive to light.    Skin:  Warm, dry and pale.      Results     Procedure Component Value Units Date/Time    Basic Metabolic Panel [[id number]]  (Abnormal) Collected:  01/13/18 0436    Specimen:  Blood Updated:  01/13/18 0731     Glucose 91 mg/dL      BUN 14.7 mg/dL      Creatinine 1.4 mg/dL      Calcium 7.4 mg/dL      Sodium 829 mEq/L      Potassium 5.5 mEq/L      Chloride 117 mEq/L      CO2 18 mEq/L     GFR [[id number]] Collected:  01/13/18 0436     Updated:  01/13/18 0731     EGFR 47.7    Magnesium [[id number]] Collected:  01/13/18 0436    Specimen:  Blood Updated:  01/13/18 0731     Magnesium 2.0 mg/dL     Cancer antigen 28-4 [[id number]] Collected:  01/11/18 1711    Specimen:  Blood Updated:  01/12/18 2026     CA 19-9 11      Ct Abdomen Pelvis Wo Iv/ Wo Po Contrast    Result Date: 01/09/2018   1. Moderate-sized bilateral pleural effusions with adjacent atelectasis 2. Small left adrenal nodule likely adenoma 3. Multiple small hepatic cysts 4. No gross malignancy or adenopathy identified on this nonenhanced CT J. Carole Binning, MD 01/09/2018 4:40 PM    Ct Head Without Contrast    Result Date: 01/07/2018  1. No acute intracranial hemorrhage is detected. 2. There is mild subcortical, deep, and periventricular leukomalacia in both cerebral hemispheres as well as in the infratentorial white matter.   This is suggestive of chronic small vessel ischemic change. Theodoro Doing, MD 01/07/2018 11:36 AM    Ct  Chest Without Contrast    Result Date: 01/07/2018  1. Multiple bilateral pulmonary nodules measuring up to 7 mm in diameter 2. Sclerotic bony abnormality in mid thoracic spine, posterior ribs, and sternum suspicious for metastatic disease J. Carole Binning, MD 01/07/2018 11:55 AM    Nm Bone Scan  Whole Body    Result Date: 01/10/2018   Multifocal osseous metastatic disease. Collene Schlichter, MD 01/10/2018 4:51 PM    Ct Guided Biopsy Bone Deep Pq Ndl    Result Date: 01/12/2018   CT guided biopsy of left iliac bone sclerotic lesion. Emeline Darling, MD 01/12/2018 4:16 PM    Assessment & Plan     1. Metastatic Prostate cancer  S/p CT guided Biopsy , Elevated PSA, as per Oncology  Positive Bone Scan  Palliative care consult    2.  GI bleeding  Hold aspirin and Plavix  Last GI work-up was in September 2019 revealing multiple AV malformation each were cauterized GI suspect bleeding from the AVMs    3.  Acute on chronic anemia  Iron TIBC B12 levels  Hematology consult    4.  Multiple bilateral pulmonary nodules  CT chest showed multiple pulmonary nodules measuring up to 7 mm in diameter and sclerotic bony abnormality in mid thoracic spine posterior ribs sternum suspicious for malignancy  Consult hematology for malignancy work-up    5. AKI   On IV Fluid    6.  History of CAD  Hold aspirin Plavix due to GI bleed    7.   Atrial fibrillation  On amiodarone    8.  Hypertension  Stable    9.  DVT prophylaxis  SCD      Elana Alm [facility]  01/13/2018

## 2018-01-13 NOTE — Progress Notes (Signed)
01/13/18 1649   Discharge Disposition   Patient preference/choice provided? Yes   Physical Discharge Disposition Home   Mode of Transportation Car   Patient/Family/POA notified of transfer plan Yes   Patient agreeable to discharge plan/expected d/c date? Yes   Family/POA agreeable to discharge plan/expected d/c date? Yes   Bedside nurse notified of transport plan? Yes   CM Interventions   Follow up appointment scheduled? No   Reason no follow up scheduled? Other (comment)  (Daughter to make appts once home)   Multidisciplinary rounds/family meeting before d/c? No   Medicare Checklist   Is this a Medicare patient? Yes   Patient received 1st IMM Letter? Yes   3 midnight inpatient qualifying stay (SNF only) Yes   If LOS 3 days or greater, did patient received 2nd IMM Letter? Yes     Pt to d/c home when medically stable - anticipated to be over weekend - spoke with daughter Joni Reining [phone]) to confirm receipt of MPOA forms which were faxed to Medical Records at this facility with hard copy in hard chart - no discussion regarding Hospice/Palliative at this time - CM will continue to follow.    Colon Branch, RNCM  [facility]  [phone]  Case Management Dept.

## 2018-01-13 NOTE — Progress Notes (Signed)
Medicine Shift Note    Braden Scale Score: 20 (01/13/18 0900)  Skin Integrity: Bruising, Tear, Laceration  Abrasion Skin Location: post head  Bruising Skin Location: bue  Redness Skin Location: sacrum, back  Patient Lines/Drains/Airways Status    Active Lines, Drains and Airways     Name:   Placement date:   Placement time:   Site:   Days:    Peripheral IV 01/07/18 Right Antecubital   01/07/18    1057    Antecubital   6    Peripheral IV 01/07/18 Left Antecubital   01/07/18    1925    Antecubital   5               Wound 01/07/18 Skin Tear Elbow Left (Active)   Date First Assessed/Time First Assessed: 01/07/18 1400   Wound Type: Skin Tear  Location: Elbow  Wound Location Orientation: Left  Present on Admission: Yes      Assessments 01/07/2018  2:00 PM 01/12/2018 10:00 PM   Site Description Red;Pink -   Peri-wound Description Maroon;Purple;Red -   Drainage Amount Scant -   Drainage Description Thin -   Treatments Site care;Cleansed -   Dressing Silicone Adhesive Foam -   Dressing Changed New Changed   Dressing Status Clean;Dry;Intact Clean;Dry;Intact       No Linked orders to display       Wound 01/07/18 Skin Tear Elbow Right (Active)   Date First Assessed/Time First Assessed: 01/07/18 1401   Wound Type: Skin Tear  Location: Elbow  Wound Location Orientation: Right  Present on Admission: Yes      Assessments 01/07/2018  2:00 PM 01/12/2018 10:00 PM   Site Description Pink;Red -   Peri-wound Description Pink;Red;Maroon -   Drainage Amount Scant -   Drainage Description Thin -   Treatments Site care;Cleansed -   Dressing Silicone Adhesive Foam -   Dressing Changed New Changed   Dressing Status Clean;Dry;Intact Clean;Dry;Intact       No Linked orders to display       Wound 01/07/18 Traumatic Head Posterior;Upper laceration (Active)   Date First Assessed/Time First Assessed: 01/07/18 1400   Wound Type: Traumatic  Location: Head  Wound Location Orientation: Posterior;Upper  Wound Description (Comments): laceration  Present  on Admission: Yes      Assessments 01/07/2018  2:00 PM 01/09/2018 11:09 PM   Site Description Black;Maroon;Red Black;Maroon   Peri-wound Description Red;Pink Pink;Red   Drainage Amount Scant None   Drainage Description Thin -   Treatments Site care;Cleansed -   Dressing Open to air Cadexomer Iodine Gel;Open to air   Dressing Status Dry;Intact -       No Linked orders to display       Wound 01/12/18 Surgical Incision Hip Left biopsy incision (Active)   Date First Assessed/Time First Assessed: 01/12/18 2000   Wound Type: Surgical Incision  Location: Hip  Wound Location Orientation: Left  Wound Description (Comments): biopsy incision  Present on Admission: No      Assessments 01/13/2018  2:00 AM   Dressing Status Dry;Intact;Old drainage       No Linked orders to display       Last BM: 10/24    Pending Orders: n/a    Discharge Plan: to be determine    POC: daughter    Skin: intact    Tele: n/a    Activity: ambulate    PT/OT: 10/25     Interpreter Needs: n/a    Shift Note: Patient  family updated.

## 2018-01-13 NOTE — Progress Notes (Signed)
Medicine Shift Note    Braden Scale Score: 19 (01/12/18 2200)  Skin Integrity: Bruising, Tear, Redness(incision)  Abrasion Skin Location: posterior head  Bruising Skin Location: BUE  Redness Skin Location: sacrum, back  Patient Lines/Drains/Airways Status    Active Lines, Drains and Airways     Name:   Placement date:   Placement time:   Site:   Days:    Peripheral IV 01/07/18 Right Antecubital   01/07/18    1057    Antecubital   5    Peripheral IV 01/07/18 Left Antecubital   01/07/18    1925    Antecubital   5               Wound 01/07/18 Skin Tear Elbow Left (Active)   Date First Assessed/Time First Assessed: 01/07/18 1400   Wound Type: Skin Tear  Location: Elbow  Wound Location Orientation: Left  Present on Admission: Yes      Assessments 01/07/2018  2:00 PM 01/12/2018 10:00 PM   Site Description Red;Pink -   Peri-wound Description Maroon;Purple;Red -   Drainage Amount Scant -   Drainage Description Thin -   Treatments Site care;Cleansed -   Dressing Silicone Adhesive Foam -   Dressing Changed New Changed   Dressing Status Clean;Dry;Intact Clean;Dry;Intact       No Linked orders to display       Wound 01/07/18 Skin Tear Elbow Right (Active)   Date First Assessed/Time First Assessed: 01/07/18 1401   Wound Type: Skin Tear  Location: Elbow  Wound Location Orientation: Right  Present on Admission: Yes      Assessments 01/07/2018  2:00 PM 01/12/2018 10:00 PM   Site Description Pink;Red -   Peri-wound Description Pink;Red;Maroon -   Drainage Amount Scant -   Drainage Description Thin -   Treatments Site care;Cleansed -   Dressing Silicone Adhesive Foam -   Dressing Changed New Changed   Dressing Status Clean;Dry;Intact Clean;Dry;Intact       No Linked orders to display       Wound 01/07/18 Traumatic Head Posterior;Upper laceration (Active)   Date First Assessed/Time First Assessed: 01/07/18 1400   Wound Type: Traumatic  Location: Head  Wound Location Orientation: Posterior;Upper  Wound Description (Comments):  laceration  Present on Admission: Yes      Assessments 01/07/2018  2:00 PM 01/09/2018 11:09 PM   Site Description Black;Maroon;Red Black;Maroon   Peri-wound Description Red;Pink Pink;Red   Drainage Amount Scant None   Drainage Description Thin -   Treatments Site care;Cleansed -   Dressing Open to air Cadexomer Iodine Gel;Open to air   Dressing Status Dry;Intact -       No Linked orders to display       Wound 01/12/18 Surgical Incision Hip Left biopsy incision (Active)   Date First Assessed/Time First Assessed: 01/12/18 2000   Wound Type: Surgical Incision  Location: Hip  Wound Location Orientation: Left  Wound Description (Comments): biopsy incision  Present on Admission: No      Assessments 01/13/2018  2:00 AM   Dressing Status Dry;Intact;Old drainage       No Linked orders to display       Last BM:10/24    Pending Orders: AM labs    Discharge Plan: when medically stable    POC: the patient    Skin: See chart above    Tele: no    Activity: x1 assist    PT/OT: 10/23    Interpreter Needs: No  Shift Note: A&Ox4, denies pain,no signs of bleeding to the biopsy incision site, call bell in reach, fall precautions in place. VSS

## 2018-01-13 NOTE — Plan of Care (Signed)
Problem: Safety  Goal: Patient will be free from injury during hospitalization  Outcome: Progressing  Flowsheets (Taken 01/13/2018 1039)  Patient will be free from injury during hospitalization : Assess patient's risk for falls and implement fall prevention plan of care per policy; Provide and maintain safe environment; Ensure appropriate safety devices are available at the bedside; Include patient/ family/ care giver in decisions related to safety; Hourly rounding; Use appropriate transfer methods  Goal: Patient will be free from infection during hospitalization  Outcome: Progressing  Flowsheets (Taken 01/13/2018 1039)  Free from Infection during hospitalization: Assess and monitor for signs and symptoms of infection; Monitor lab/diagnostic results; Monitor all insertion sites (i.e. indwelling lines, tubes, urinary catheters, and drains)     Problem: Pain  Goal: Pain at adequate level as identified by patient  Outcome: Progressing  Flowsheets (Taken 01/13/2018 1039)  Pain at adequate level as identified by patient: Identify patient comfort function goal; Assess for risk of opioid induced respiratory depression, including snoring/sleep apnea. Alert healthcare team of risk factors identified.; Assess pain on admission, during daily assessment and/or before any "as needed" intervention(s); Reassess pain within 30-60 minutes of any procedure/intervention, per Pain Assessment, Intervention, Reassessment (AIR) Cycle; Evaluate patient's satisfaction with pain management progress; Evaluate if patient comfort function goal is met     Problem: Side Effects from Pain Analgesia  Goal: Patient will experience minimal side effects of analgesic therapy  Outcome: Progressing  Flowsheets (Taken 01/13/2018 1039)  Patient will experience minimal side effects of analgesic therapy: Monitor/assess patient's respiratory status (RR depth, effort, breath sounds); Assess for changes in cognitive function; Prevent/manage side effects per  LIP orders (i.e. nausea, vomiting, pruritus, constipation, urinary retention, etc.)     Problem: Discharge Barriers  Goal: Patient will be discharged home or other facility with appropriate resources  Outcome: Progressing  Flowsheets (Taken 01/13/2018 1039)  Discharge to home or other facility with appropriate resources: Provide appropriate patient education; Provide information on available health resources     Problem: Psychosocial and Spiritual Needs  Goal: Demonstrates ability to cope with hospitalization/illness  Outcome: Progressing  Flowsheets (Taken 01/13/2018 1039)  Demonstrates ability to cope with hospitalizations/illness: Encourage verbalization of feelings/concerns/expectations; Assist patient to identify own strengths and abilities; Provide quiet environment; Encourage patient to set small goals for self; Encourage participation in diversional activity     Problem: Moderate/High Fall Risk Score >5  Goal: Patient will remain free of falls  Outcome: Progressing  Flowsheets (Taken 01/13/2018 1039)  Moderate Risk (6-13): MOD-Re-orient confused patients; MOD-Utilize diversion activities     Problem: Inadequate Tissue Perfusion  Goal: Adequate tissue perfusion will be maintained  Outcome: Progressing  Flowsheets (Taken 01/13/2018 1039)  Adequate tissue perfusion will be maintained: Monitor/assess vital signs; Monitor/assess lab values and report abnormal values; Monitor/assess neurovascular status (pulses, capillary refill, pain, paresthesia, paralysis, presence of edema); Monitor/assess for signs of VTE (edema of calf/thigh redness, pain); Monitor intake and output; Monitor for signs and symptoms of a pulmonary embolism (dyspnea, tachypnea, tachycardia, confusion)     Problem: Altered GI Function  Goal: Fluid and electrolyte balance are achieved/maintained  Outcome: Progressing  Flowsheets (Taken 01/13/2018 1039)  Fluid and electrolyte balance are achieved/maintained: Monitor intake and output every shift;  Provide adequate hydration; Monitor/assess lab values and report abnormal values; Assess for confusion/personality changes; Assess and reassess fluid and electrolyte status; Monitor daily weight  Goal: Elimination patterns are normal or improving  Outcome: Progressing  Flowsheets (Taken 01/13/2018 1039)  Elimination patterns are normal or improving: Report  abnormal assessment to physician; Anticipate/assist with toileting needs; Assess for normal bowel sounds; Monitor for abdominal distension; Monitor for abdominal discomfort; Assess for signs and symptoms of bleeding.  Report signs of bleeding to physician  Goal: Nutritional intake is adequate  Outcome: Progressing  Flowsheets (Taken 01/13/2018 1039)  Nutritional intake is adequate: Encourage/perform oral hygiene as appropriate; Allow adequate time for meals; Monitor daily weights; Assist patient with meals/food selection; Include patient/patient care companion in decisions related to nutrition  Goal: Mobility/Activity is maintained at optimal level for patient  Outcome: Progressing  Flowsheets (Taken 01/13/2018 1039)  Mobility/activity is maintained at optimal level for patient: Plan activities to conserve energy, plan rest periods; Perform active/passive ROM; Encourage independent activity per ability; Increase mobility as tolerated/progressive mobility; Maintain proper body alignment; Reposition patient every 2 hours and as needed unless able to reposition self  Goal: No bleeding  Outcome: Progressing  Flowsheets (Taken 01/13/2018 1039)  No bleeding : Monitor and assess vitals and hemodynamic parameters; Monitor/assess lab values and report abnormal values; Assess for bruising/petechia

## 2018-01-13 NOTE — UM Notes (Signed)
CSR for January 13, 2018  Unit: Medicine    84 year old male admitted with anemia from GI bleed due to angioectasias of stomach and duodenum. Also multiple pulmonary nodules and sclerotic bony abnormality in mid thoracic spine, posterior ribs, sternum suspicious for malignancy. S/p CT guided biopsy of left iliac bone yesterday.    Vitals:  98.3, 60 16, 102/53, 98% RA    Labs:  BUN 33, Cr 1.4, K 5.5, Cl 117, CO2 18, Ca 7.4, GFR 47.7    Current Facility-Administered Medications   Medication Dose Route Frequency   . amiodarone  100 mg Oral Daily   . atorvastatin  80 mg Oral QHS   . bicalutamide  50 mg Oral Daily   . furosemide  40 mg Oral Daily   . sodium polystyrene  15 g Oral Once     Continuous Infusions:  . 0.9 % NaCl with KCl 20 mEq 75 mL/hr (01/13/18 0554)     Plan:  Hold aspirin and plavix  Palliative care consult   Hematology consult - acute on chronic anemia  IV Fluids - AKI  PT recommending Home with no needs  Monitor H+H    Pollyann Kennedy, RN, BSN   UR Case Manager   [phone] voice mail only  Case Management Department   [facility]   [address]   [place], [place] [address]  [email or web address]

## 2018-01-13 NOTE — PT Eval Note (Signed)
[facility]   Physical Therapy Evaluation   Patient: [name]    MRN#: [id number]   Unit: [facility] TOWER 9  Bed: V409/W119.14    Discharge Recommendations:   Discharge Recommendation: Home with no needs   DME Recommendation: DME Recommended for Discharge: (None)      Assessment:   [name] is a 84 y.o. male admitted 01/07/2018.  Pt's functional mobility is impacted by:  decreased strength. Standardized tests and exams incorporated into evaluation include balance, ROM  and Strength.  Pt demonstrates a stable clinical presentation.  Patient demonstrated independence with bed mobility, seated static and dynamic balance activities. Standing dynamic balance activities to include dynamic gait with supervision for safety on unit of 250 feet without LOB or increased sway. Denied dizziness/lightheadedness. Patient is not presenting with functional deficits that would warrant the need for PT services and is at/near baseline at this time. No further skilled acute PT services needed. D/C PT and reconsult as needed.    Impairments: Assessment: Appears to be at baseline for balance;Appears to be at baseline for mobility.     Therapy Diagnosis: N/A    Rehabilitation Potential: N/A    Treatment Activities: PT evaluation  Educated the patient to role of physical therapy, plan of care, goals of therapy and safety with mobility and ADLs, home safety.    Plan:   Treatment/Interventions: No skilled interventions needed at this time     PT Frequency: one time visit - therapy discontinued   Risks/Benefits/POC Discussed with Pt/Family: With patient        Precautions and Contraindications:   Weight Bearing Status: no restrictions  Other Precautions: Fall Risk, HOH    Consult received for [name] for PT Evaluation and Treatment.  Patient's medical condition is appropriate for Physical therapy intervention at this time.    Medical Diagnosis: GI bleed [K92.2]  Anemia, unspecified type  [D64.9]  Gastrointestinal hemorrhage, unspecified gastrointestinal hemorrhage type [K92.2]      History of Present Illness:   [name] is a 84 y.o. male admitted on 01/07/2018 with "PMH anemia, GI bleed due to angioectasias of stomach and duodenum, CAD s/p stents on plavix admitted with dizziness, weakness, and falls due to legs buckling.  The patient was recently admitted from 12/03/17-12/12/17 for anemia and GI bleed.  EGD at the time showed many angioectasias of the stomach and duodenum some of which were actively oozing and ablated.  He also had epistaxis which required packing by ENT during that admission.  After admission, he restarted plavix and was doing well up until the last week.  He has been progressively more dizzy and weak and has fallen several times due to his legs buckling.  He says he never lost consciousness, no bowel or bladder incontinence, no tongue biting.  He has noticed dark black stool but is on iron and attributed the dark stools to iron.  He vomited yesterday but no hematemesis.  He denies CP, abdominal pain, SOB.  He has right sided tenderness to palpation due to his fall.  The patient has not taken any medication since Thursday because he hasn't felt well and in fact has stayed in bed for the last few days." per H&P      Past Medical/Surgical History:  Past Medical History:   Diagnosis Date   . Anemia     iron low   . Black tarry stools 2019   . CKD (chronic kidney disease), stage III    .  Coronary artery disease    . DOE (dyspnea on exertion)     mild   . Encounter for blood transfusion    . Glaucoma NEC    . Hyperlipidemia    . Hypertensive disorder    . Macrocytosis    . Paroxysmal A-fib    . Skin cancer     2006 removed mole   . SLE (systemic lupus erythematosus) 1990   . Ulcerative colitis, chronic    . Vision abnormalities      Past Surgical History:   Procedure Laterality Date   . CARDIAC SURGERY      3 stents   . EGD, COLONOSCOPY  04/03/2012    Procedure: EGD, COLONOSCOPY;   Surgeon: Lestine Mount, MD;  Location: [facility];  Service: Gastroenterology;  Laterality: N/A;   . EGD, COLONOSCOPY N/A 02/11/2017    Procedure: EGD, COLONOSCOPY;  Surgeon: Lestine Mount, MD;  Location: [facility];  Service: Gastroenterology;  Laterality: N/A;   . EGD, COLONOSCOPY N/A 12/08/2017    Procedure: EGD, COLONOSCOPY;  Surgeon: Jacqulyn Cane, MD;  Location: [facility];  Service: Gastroenterology;  Laterality: N/A;   . EYE SURGERY      cataract         X-Rays/Tests/Labs:  Reviewed.  Social History:   Prior Level of Function:  Prior level of function: Independent with ADLs, Ambulates independently  Baseline Activity Level: Household ambulation  DME Currently at Home: (None In Place)    Home Living Arrangements:  Living Arrangements: Alone  Type of Home: House  Home Layout: Multi-level, Bed/bath upstairs  DME Currently at Home: (None In Place)    Subjective:   Patient is agreeable to participation in the therapy session. Nursing clears patient for therapy.     Patient Goal: To go home    Pain Assessment  Pain Assessment: No/denies pain    Objective:   Observation of Patient/Vital Signs:  Patient is in bed with peripheral IV in place.    Observation of Patient/Vital signs:       Cognition/Neuro Status  Arousal/Alertness: Appropriate responses to stimuli  Attention Span: Appears intact  Orientation Level: Oriented X4  Memory: Appears intact  Following Commands: Follows all commands and directions without difficulty  Safety Awareness: independent  Problem Solving: Able to problem solve independently  Motor Planning: intact        Musculoskeletal Examination:  Gross ROM  Neck/Trunk ROM: within functional limits  Right Upper Extremity ROM: within functional limits  Left Upper Extremity ROM: within functional limits  Right Lower Extremity ROM: within functional limits  Left Lower Extremity ROM: within functional limits    Gross Strength  Neck/Trunk Strength: WFL  Right Upper Extremity Strength:  within functional limits  Left Upper Extremity Strength: within functional limits  Right Lower Extremity Strength: within functional limits  Left Lower Extremity Strength: within functional limits    Tone  Tone: within functional limits    Functional Mobility:  Supine to Sit: Independent  Scooting to EOB: Independent  Sit to Supine: Independent  Sit to Stand: Independent  Stand to Sit: Independent         Ambulation:  PMP - Progressive Mobility Protocol   PMP Activity: Step 7 - Walks out of Room  Distance Walked (ft) (Step 6,7): 250 Feet     Ambulation: Independent         Balance:  Balance: needs focused assessment  Standing - Static: Good  Standing - Dynamic: Good  Participation and Activity Tolerance:  Participation Effort: excellent  Endurance: [facility])      Patient left with call bell within reach, all needs met, SCDs not in use, fall mat in place, bed alarm not on as found RN aware, chair alarm n/a and all questions answered. RN notified of session outcome and patient response.       Goals:   D/C PT       Serita Butcher PT, DPT [phone]  01/13/2018, 12:01 PM      Time of treatment:   PT Received On: 01/13/18  Start Time: 1130  Stop Time: 1155  Time Calculation (min): 25 min

## 2018-01-13 NOTE — Progress Notes (Signed)
PROGRESS NOTE    Date Time: 01/13/18 3:58 PM  Patient Name: [name],[name]      Assessment:   1.Lung nodule  2.Anemia:,with  longstanding history mucosal angioectasia/ AVM  3.Bone lesion  4.CAD S/P PCI   5.Afib: AC on hold due to bleed   6.HTN  7.HLD: ON Statin  Plan:   -CT A/P : neg for Malignancy  -S/P Lt Iliac bone Bx  -PSA elevated favoring Met Prostate CA  -Cont with  Casodex  -Further mgm based on Bx results   -Hb Dropped slightly, will cont to monitor the trend ( pending CBC today)   Cont plan of care    All labs,notes and past 24 hrs events reviewed   Subjective:   Pt was seen at bedside,feels better today, appetite is good, no CP or SOB  Medications:     Current Facility-Administered Medications   Medication Dose Route Frequency   . amiodarone  100 mg Oral Daily   . atorvastatin  80 mg Oral QHS   . bicalutamide  50 mg Oral Daily   . furosemide  40 mg Oral Daily       Review of Systems:   A comprehensive review of systems was:  Ten point review of systems negative or as per above endorsements.    Physical Exam:     Vitals:    01/13/18 1156   BP: 115/71   Pulse: (!) 58   Resp:    Temp: 98.2 F (36.8 C)   SpO2: 100%       Intake and Output Summary (Last 24 hours) at Date Time    Intake/Output Summary (Last 24 hours) at 01/13/2018 1558  Last data filed at 01/13/2018 1021  Gross per 24 hour   Intake 250 ml   Output 1525 ml   Net -1275 ml       General appearance - alert, well appearing, and in no distress  Mental status - alert, oriented to person, place, and time  Eyes - pupils equal and reactive, extraocular eye movements intact  Ears - bilateral TM's and external ear canals normal  Nose - normal and patent, no erythema, discharge or polyps  Mouth - mucous membranes moist, pharynx normal without lesions  Neck - supple, no significant adenopathy  Chest - clear to auscultation, no wheezes, rales or rhonchi, symmetric air entry  Heart - normal rate, regular rhythm, normal S1, S2, no murmurs, rubs, clicks or  gallops  Abdomen - soft, nontender, nondistended, no masses or organomegaly  Neurological - alert, oriented, normal speech, no focal findings or movement disorder noted  Musculoskeletal - no joint tenderness, deformity or swelling  Extremities - peripheral pulses normal, no pedal edema, no clubbing or cyanosis  Skin - normal coloration and turgor, no rashes, no suspicious skin lesions noted    Labs:     Results     Procedure Component Value Units Date/Time    Basic Metabolic Panel [[id number]]  (Abnormal) Collected:  01/13/18 0436    Specimen:  Blood Updated:  01/13/18 0731     Glucose 91 mg/dL      BUN 08.6 mg/dL      Creatinine 1.4 mg/dL      Calcium 7.4 mg/dL      Sodium 578 mEq/L      Potassium 5.5 mEq/L      Chloride 117 mEq/L      CO2 18 mEq/L     GFR [[id number]] Collected:  01/13/18 0436  Updated:  01/13/18 0731     EGFR 47.7    Magnesium [[id number]] Collected:  01/13/18 0436    Specimen:  Blood Updated:  01/13/18 0731     Magnesium 2.0 mg/dL     Cancer antigen 40-9 [[id number]] Collected:  01/11/18 1711    Specimen:  Blood Updated:  01/12/18 2026     CA 19-9 11              Rads:   No results found.  Radiological Procedure reviewed.    Signed by: Janziel Bellow

## 2018-01-14 LAB — BASIC METABOLIC PANEL
BUN: 29 mg/dL — ABNORMAL HIGH (ref 9.0–28.0)
CO2: 18 mEq/L — ABNORMAL LOW (ref 22–29)
Calcium: 7.7 mg/dL — ABNORMAL LOW (ref 7.9–10.2)
Chloride: 116 mEq/L — ABNORMAL HIGH (ref 100–111)
Creatinine: 1.1 mg/dL (ref 0.7–1.3)
Glucose: 84 mg/dL (ref 70–100)
Potassium: 5.3 mEq/L — ABNORMAL HIGH (ref 3.5–5.1)
Sodium: 139 mEq/L (ref 136–145)

## 2018-01-14 LAB — CBC
Absolute NRBC: 0 10*3/uL (ref 0.00–0.00)
Hematocrit: 26.6 % — ABNORMAL LOW (ref 37.6–49.6)
Hgb: 7.9 g/dL — ABNORMAL LOW (ref 12.5–17.1)
MCH: 29.6 pg (ref 25.1–33.5)
MCHC: 29.7 g/dL — ABNORMAL LOW (ref 31.5–35.8)
MCV: 99.6 fL — ABNORMAL HIGH (ref 78.0–96.0)
MPV: 10.1 fL (ref 8.9–12.5)
Nucleated RBC: 0 /100 WBC (ref 0.0–0.0)
Platelets: 273 10*3/uL (ref 142–346)
RBC: 2.67 10*6/uL — ABNORMAL LOW (ref 4.20–5.90)
RDW: 19 % — ABNORMAL HIGH (ref 11–15)
WBC: 6.89 10*3/uL (ref 3.10–9.50)

## 2018-01-14 LAB — GFR: EGFR: >60

## 2018-01-14 LAB — MAGNESIUM: Magnesium: 2.1 mg/dL (ref 1.6–2.6)

## 2018-01-14 NOTE — Plan of Care (Signed)
Problem: Safety  Goal: Patient will be free from injury during hospitalization  Outcome: Progressing  Flowsheets (Taken 01/14/2018 0340)  Patient will be free from injury during hospitalization : Assess patient's risk for falls and implement fall prevention plan of care per policy; Use appropriate transfer methods; Provide and maintain safe environment; Ensure appropriate safety devices are available at the bedside; Include patient/ family/ care giver in decisions related to safety; Hourly rounding  Goal: Patient will be free from infection during hospitalization  Outcome: Progressing  Flowsheets (Taken 01/14/2018 0340)  Free from Infection during hospitalization: Monitor all insertion sites (i.e. indwelling lines, tubes, urinary catheters, and drains); Monitor lab/diagnostic results; Encourage patient and family to use good hand hygiene technique     Problem: Pain  Goal: Pain at adequate level as identified by patient  Outcome: Progressing  Flowsheets (Taken 01/14/2018 0340)  Pain at adequate level as identified by patient: Identify patient comfort function goal; Assess for risk of opioid induced respiratory depression, including snoring/sleep apnea. Alert healthcare team of risk factors identified.; Assess pain on admission, during daily assessment and/or before any "as needed" intervention(s); Evaluate if patient comfort function goal is met; Reassess pain within 30-60 minutes of any procedure/intervention, per Pain Assessment, Intervention, Reassessment (AIR) Cycle     Problem: Side Effects from Pain Analgesia  Goal: Patient will experience minimal side effects of analgesic therapy  Outcome: Progressing  Flowsheets (Taken 01/14/2018 0340)  Patient will experience minimal side effects of analgesic therapy: Monitor/assess patient's respiratory status (RR depth, effort, breath sounds); Assess for changes in cognitive function; Prevent/manage side effects per LIP orders (i.e. nausea, vomiting, pruritus,  constipation, urinary retention, etc.); Evaluate for opioid-induced sedation with appropriate assessment tool (i.e. POSS)     Problem: Discharge Barriers  Goal: Patient will be discharged home or other facility with appropriate resources  Outcome: Progressing  Flowsheets (Taken 01/14/2018 0340)  Discharge to home or other facility with appropriate resources: Provide appropriate patient education; Provide information on available health resources     Problem: Psychosocial and Spiritual Needs  Goal: Demonstrates ability to cope with hospitalization/illness  Outcome: Progressing  Flowsheets (Taken 01/14/2018 0340)  Demonstrates ability to cope with hospitalizations/illness: Encourage verbalization of feelings/concerns/expectations; Provide quiet environment; Encourage patient to set small goals for self; Include patient/ patient care companion in decisions; Encourage participation in diversional activity     Problem: Moderate/High Fall Risk Score >5  Goal: Patient will remain free of falls  Outcome: Progressing  Flowsheets (Taken 01/13/2018 2030)  High (Greater than 13): HIGH-Apply yellow "Fall Risk" arm band;HIGH-Initiate use of floor mats as appropriate;HIGH-Consider use of low bed;HIGH-Activate bed/chair exit alarm where available     Problem: Compromised Tissue integrity  Goal: Damaged tissue is healing and protected  Outcome: Progressing  Flowsheets (Taken 01/14/2018 0340)  Damaged tissue is healing and protected : Monitor/assess Braden scale every shift; Provide wound care per wound care algorithm; Reposition patient every 2 hours and as needed unless able to reposition self; Relieve pressure to bony prominences for patients at moderate and high risk; Increase activity as tolerated/progressive mobility; Avoid shearing injuries; Use bath wipes, not soap and water, for daily bathing; Use incontinence wipes for cleaning urine, stool and caustic drainage. Foley care as needed; Encourage use of lotion/moisturizer on  skin  Goal: Nutritional status is improving  Outcome: Progressing  Flowsheets (Taken 01/14/2018 0340)  Nutritional status is improving: Assist patient with eating; Allow adequate time for meals; Encourage patient to take dietary supplement(s) as ordered; Collaborate with Clinical  Nutritionist     Problem: Hemodynamic Status: Cardiac  Goal: Stable vital signs and fluid balance  Outcome: Progressing  Flowsheets (Taken 01/14/2018 0340)  Stable vital signs and fluid balance: Monitor/assess vital signs and telemetry per unit protocol; Assess signs and symptoms associated with cardiac rhythm changes; Weigh on admission and record weight daily     Problem: Inadequate Tissue Perfusion  Goal: Adequate tissue perfusion will be maintained  Outcome: Progressing  Flowsheets (Taken 01/14/2018 0340)  Adequate tissue perfusion will be maintained: Monitor/assess vital signs; Monitor/assess lab values and report abnormal values; Monitor/assess neurovascular status (pulses, capillary refill, pain, paresthesia, paralysis, presence of edema); Monitor intake and output; Monitor for signs and symptoms of a pulmonary embolism (dyspnea, tachypnea, tachycardia, confusion); Monitor/assess for signs of VTE (edema of calf/thigh redness, pain)     Problem: Altered GI Function  Goal: Fluid and electrolyte balance are achieved/maintained  Outcome: Progressing  Flowsheets (Taken 01/14/2018 0340)  Fluid and electrolyte balance are achieved/maintained: Monitor intake and output every shift; Provide adequate hydration; Monitor/assess lab values and report abnormal values; Assess for confusion/personality changes; Monitor daily weight  Goal: Elimination patterns are normal or improving  Outcome: Progressing  Flowsheets (Taken 01/14/2018 0340)  Elimination patterns are normal or improving: Report abnormal assessment to physician; Anticipate/assist with toileting needs; Assess for normal bowel sounds; Monitor for abdominal distension; Monitor for  abdominal discomfort  Goal: Nutritional intake is adequate  Outcome: Progressing  Flowsheets (Taken 01/14/2018 0340)  Nutritional intake is adequate: Monitor daily weights; Allow adequate time for meals; Encourage/administer dietary supplements as ordered (i.e. tube feed, TPN, oral, OGT/NGT, supplements); Encourage/perform oral hygiene as appropriate; Assist patient with meals/food selection  Goal: Mobility/Activity is maintained at optimal level for patient  Outcome: Progressing  Flowsheets (Taken 01/14/2018 0340)  Mobility/activity is maintained at optimal level for patient: Increase mobility as tolerated/progressive mobility; Perform active/passive ROM; Maintain proper body alignment; Encourage independent activity per ability; Reposition patient every 2 hours and as needed unless able to reposition self  Goal: No bleeding  Outcome: Progressing  Flowsheets (Taken 01/14/2018 0340)  No bleeding : Monitor and assess vitals and hemodynamic parameters; Monitor/assess lab values and report abnormal values; Assess for bruising/petechia

## 2018-01-14 NOTE — Progress Notes (Signed)
PROGRESS NOTE    Date Time: 01/14/18 1:47 PM  Patient Name: [name],[name]      Assessment:   1.Lung nodule  2.Anemia:,with  longstanding history mucosal angioectasia/ AVM  3.Bone lesion  4.CAD S/P PCI   5.Afib: AC on hold due to bleed   6.HTN  7.HLD: ON Statin  Plan:   -CT A/P : neg for Malignancy  -S/P Lt Iliac bone Bx  -PSA elevated favoring Met Prostate CA  -Cont with  Casodex  -Further mgm based on Bx results   -Hb more stable   Cont plan of care    All labs,notes and past 24 hrs events reviewed   Subjective:   Pt was seen at bedside,feels better today, appetite is good, no CP or SOB  Medications:     Current Facility-Administered Medications   Medication Dose Route Frequency   . amiodarone  100 mg Oral Daily   . atorvastatin  80 mg Oral QHS   . bicalutamide  50 mg Oral Daily   . furosemide  40 mg Oral Daily       Review of Systems:   A comprehensive review of systems was:  Ten point review of systems negative or as per above endorsements.    Physical Exam:     Vitals:    01/14/18 1122   BP: 120/63   Pulse: 67   Resp: 16   Temp: 97.9 F (36.6 C)   SpO2: 100%       Intake and Output Summary (Last 24 hours) at Date Time    Intake/Output Summary (Last 24 hours) at 01/14/2018 1347  Last data filed at 01/14/2018 1122  Gross per 24 hour   Intake -   Output 1300 ml   Net -1300 ml       General appearance - alert, well appearing, and in no distress  Mental status - alert, oriented to person, place, and time  Eyes - pupils equal and reactive, extraocular eye movements intact  Ears - bilateral TM's and external ear canals normal  Nose - normal and patent, no erythema, discharge or polyps  Mouth - mucous membranes moist, pharynx normal without lesions  Neck - supple, no significant adenopathy  Chest - clear to auscultation, no wheezes, rales or rhonchi, symmetric air entry  Heart - normal rate, regular rhythm, normal S1, S2, no murmurs, rubs, clicks or gallops  Abdomen - soft, nontender, nondistended, no masses or  organomegaly  Neurological - alert, oriented, normal speech, no focal findings or movement disorder noted  Musculoskeletal - no joint tenderness, deformity or swelling  Extremities - peripheral pulses normal, no pedal edema, no clubbing or cyanosis  Skin - normal coloration and turgor, no rashes, no suspicious skin lesions noted    Labs:     Results     Procedure Component Value Units Date/Time    CBC without differential [[id number]]  (Abnormal) Collected:  01/14/18 1246    Specimen:  Blood Updated:  01/14/18 1322     WBC 6.89 x10 3/uL      Hgb 7.9 g/dL      Hematocrit 40.9 %      Platelets 273 x10 3/uL      RBC 2.67 x10 6/uL      MCV 99.6 fL      MCH 29.6 pg      MCHC 29.7 g/dL      RDW 19 %      MPV 10.1 fL      Nucleated RBC  0.0 /100 WBC      Absolute NRBC 0.00 x10 3/uL     GFR [[id number]] Collected:  01/14/18 0334     Updated:  01/14/18 0625     EGFR >60.0    Basic Metabolic Panel [[id number]]  (Abnormal) Collected:  01/14/18 0334    Specimen:  Blood Updated:  01/14/18 0625     Glucose 84 mg/dL      BUN 78.2 mg/dL      Creatinine 1.1 mg/dL      Calcium 7.7 mg/dL      Sodium 956 mEq/L      Potassium 5.3 mEq/L      Chloride 116 mEq/L      CO2 18 mEq/L     Magnesium [[id number]] Collected:  01/14/18 0334    Specimen:  Blood Updated:  01/14/18 0625     Magnesium 2.1 mg/dL     Basic Metabolic Panel [[id number]]  (Abnormal) Collected:  01/13/18 2015    Specimen:  Blood Updated:  01/13/18 2050     Glucose 100 mg/dL      BUN 41.3 mg/dL      Creatinine 1.2 mg/dL      Calcium 7.5 mg/dL      Sodium 244 mEq/L      Potassium 5.4 mEq/L      Chloride 116 mEq/L      CO2 18 mEq/L     GFR [[id number]] Collected:  01/13/18 2015     Updated:  01/13/18 2050     EGFR 57.0    CBC without differential [[id number]]  (Abnormal) Collected:  01/13/18 2015    Specimen:  Blood Updated:  01/13/18 2034     WBC 6.22 x10 3/uL      Hgb 7.0 g/dL      Hematocrit 59.5 %      Platelets 230 x10 3/uL      RBC 2.37 x10 6/uL      MCV 100.0 fL      MCH 29.5 pg       MCHC 29.5 g/dL      RDW 18 %      MPV 10.4 fL      Nucleated RBC 0.0 /100 WBC      Absolute NRBC 0.00 x10 3/uL               Rads:   No results found.  Radiological Procedure reviewed.    Signed by: Jed Bellow

## 2018-01-14 NOTE — Progress Notes (Signed)
Medicine Shift Note    Braden Scale Score: 20 (01/13/18 2030)  Skin Integrity: Bruising, Tear, Laceration  Abrasion Skin Location: post head  Bruising Skin Location: BUE  Redness Skin Location: sacrum  Patient Lines/Drains/Airways Status    Active Lines, Drains and Airways     Name:   Placement date:   Placement time:   Site:   Days:    Peripheral IV 01/07/18 Right Antecubital   01/07/18    1057    Antecubital   6    Peripheral IV 01/07/18 Left Antecubital   01/07/18    1925    Antecubital   6               Wound 01/07/18 Skin Tear Elbow Left (Active)   Date First Assessed/Time First Assessed: 01/07/18 1400   Wound Type: Skin Tear  Location: Elbow  Wound Location Orientation: Left  Present on Admission: Yes      Assessments 01/07/2018  2:00 PM 01/12/2018 10:00 PM   Site Description Red;Pink -   Peri-wound Description Maroon;Purple;Red -   Drainage Amount Scant -   Drainage Description Thin -   Treatments Site care;Cleansed -   Dressing Silicone Adhesive Foam -   Dressing Changed New Changed   Dressing Status Clean;Dry;Intact Clean;Dry;Intact       No Linked orders to display       Wound 01/07/18 Skin Tear Elbow Right (Active)   Date First Assessed/Time First Assessed: 01/07/18 1401   Wound Type: Skin Tear  Location: Elbow  Wound Location Orientation: Right  Present on Admission: Yes      Assessments 01/07/2018  2:00 PM 01/12/2018 10:00 PM   Site Description Pink;Red -   Peri-wound Description Pink;Red;Maroon -   Drainage Amount Scant -   Drainage Description Thin -   Treatments Site care;Cleansed -   Dressing Silicone Adhesive Foam -   Dressing Changed New Changed   Dressing Status Clean;Dry;Intact Clean;Dry;Intact       No Linked orders to display       Wound 01/07/18 Traumatic Head Posterior;Upper laceration (Active)   Date First Assessed/Time First Assessed: 01/07/18 1400   Wound Type: Traumatic  Location: Head  Wound Location Orientation: Posterior;Upper  Wound Description (Comments): laceration  Present on  Admission: Yes      Assessments 01/07/2018  2:00 PM 01/09/2018 11:09 PM   Site Description Black;Maroon;Red Black;Maroon   Peri-wound Description Red;Pink Pink;Red   Drainage Amount Scant None   Drainage Description Thin -   Treatments Site care;Cleansed -   Dressing Open to air Cadexomer Iodine Gel;Open to air   Dressing Status Dry;Intact -       No Linked orders to display       Wound 01/12/18 Surgical Incision Hip Left biopsy incision (Active)   Date First Assessed/Time First Assessed: 01/12/18 2000   Wound Type: Surgical Incision  Location: Hip  Wound Location Orientation: Left  Wound Description (Comments): biopsy incision  Present on Admission: No      Assessments 01/13/2018  2:00 AM   Dressing Status Dry;Intact;Old drainage       No Linked orders to display       Last BM: 01/13/2018    Pending Orders: Labs    Discharge Plan: When medically stable    POC: Patient    Skin: See chart     Tele: no    Activity: pt ambulates with standby assist    PT/OT: 10/25    Interpreter Needs: No  Shift Note: pt A&Ox4. Vss, denies pain at this time. Pt resting comfortably in bed. No needs expressed at this time. All safety measures in place per protocol. Will continue to monitor.

## 2018-01-14 NOTE — Progress Notes (Signed)
[name] is a 84 y.o. male patient.  Active Problems:    GI bleed  [name] is a 84 y.o. 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  The patient was recently admitted from 12/03/17-12/12/17 for anemia and GI bleed.  EGD at the time showed many angioectasias of the stomach and duodenum some of which were actively oozing and ablated.  He also had epistaxis which required packing by ENT during that admission.  After admission, he restarted plavix and was doing well up until the last week.  He has been progressively more dizzy and weak and has fallen several times due to his legs buckling.  He says he never lost consciousness, no bowel or bladder incontinence, no tongue biting.  He has noticed dark black stool but is on iron and attributed the dark stools to iron.  He vomited yesterday but no hematemesis.  He denies CP, abdominal pain, SOB.  He has right sided tenderness to palpation due to his fall.  The patient has not taken any medication since Thursday because he hasn't felt well and in fact has stayed in bed for the last few days.   Past Medical History:   Diagnosis Date   . Anemia     iron low   . Black tarry stools 2019   . CKD (chronic kidney disease), stage III    . Coronary artery disease    . DOE (dyspnea on exertion)     mild   . Encounter for blood transfusion    . Glaucoma NEC    . Hyperlipidemia    . Hypertensive disorder    . Macrocytosis    . Paroxysmal A-fib    . Skin cancer     2006 removed mole   . SLE (systemic lupus erythematosus) 1990   . Ulcerative colitis, chronic    . Vision abnormalities      Current Facility-Administered Medications   Medication Dose Route Frequency Provider Last Rate Last Dose   . 0.9%  NaCl infusion   Intravenous PRN Cherlyn Cushing, MD       . 0.9%  NaCl infusion   Intravenous PRN Cherlyn Cushing, MD       . acetaminophen (TYLENOL)  suppository 650 mg  650 mg Rectal Q6H PRN Cherlyn Cushing, MD   650 mg at 01/07/18 1602   . amiodarone (PACERONE) tablet 100 mg  100 mg Oral Daily NgJilda Panda Mangum, PA   100 mg at 01/14/18 0931   . atorvastatin (LIPITOR) tablet 80 mg  80 mg Oral QHS Clide Dales, PA   80 mg at 01/14/18 0014   . bicalutamide (CASODEX) tablet 50 mg  50 mg Oral Daily June Bellow, MD   50 mg at 01/14/18 0933   . dextrose (GLUCOSE) 40 % oral gel 15 g of glucose  15 g of glucose Oral PRN Cherlyn Cushing, MD        And   . dextrose 50 % bolus 12.5 g  12.5 g Intravenous PRN Cherlyn Cushing, MD        And   . glucagon (rDNA) (GLUCAGEN) injection 1 mg  1 mg Intramuscular PRN Cherlyn Cushing, MD       . docusate sodium (COLACE) capsule 100 mg  100 mg Oral Daily PRN Amanda Cockayne, MD   100 mg at  01/12/18 0818   . fentaNYL (PF) (SUBLIMAZE) injection 25 mcg  25 mcg Intravenous Q2H PRN Cherlyn Cushing, MD       . furosemide (LASIX) tablet 40 mg  40 mg Oral Daily Amalia Hailey [place], [place]   Stopped at 01/13/18 [phone]   . ondansetron (ZOFRAN) injection 4 mg  4 mg Intravenous Q8H PRN Cherlyn Cushing, MD         No Known Allergies  Blood pressure 97/56, pulse (!) 57, temperature 98.2 F (36.8 C), temperature source Oral, resp. rate 17, weight 53.5 kg (117 lb 15.1 oz), SpO2 98 %.    Subjective  Objective:  General Appearance:  Comfortable and in no acute distress.    Vital signs: (most recent): Blood pressure 97/56, pulse (!) 57, temperature 98.2 F (36.8 C), temperature source Oral, resp. rate 17, weight 53.5 kg (117 lb 15.1 oz), SpO2 98 %.  Vital signs are normal.    Output: Producing urine.    HEENT: Normal HEENT exam.    Lungs:  Normal effort and normal respiratory rate.  Breath sounds clear to auscultation.    Heart: Normal rate.  Regular rhythm.  S1 normal.    Abdomen: Abdomen is soft.  Bowel sounds are normal.   There is no abdominal tenderness.     Extremities: Normal range of motion.    Pulses: Distal pulses are  intact.    Neurological: Patient is alert.    Pupils:  Pupils are equal, round, and reactive to light.    Skin:  Warm, dry and pale.      Results     Procedure Component Value Units Date/Time    GFR [[id number]] Collected:  01/14/18 0334     Updated:  01/14/18 0625     EGFR >60.0    Basic Metabolic Panel [[id number]]  (Abnormal) Collected:  01/14/18 0334    Specimen:  Blood Updated:  01/14/18 0625     Glucose 84 mg/dL      BUN 29.5 mg/dL      Creatinine 1.1 mg/dL      Calcium 7.7 mg/dL      Sodium 284 mEq/L      Potassium 5.3 mEq/L      Chloride 116 mEq/L      CO2 18 mEq/L     Magnesium [[id number]] Collected:  01/14/18 0334    Specimen:  Blood Updated:  01/14/18 0625     Magnesium 2.1 mg/dL     Basic Metabolic Panel [[id number]]  (Abnormal) Collected:  01/13/18 2015    Specimen:  Blood Updated:  01/13/18 2050     Glucose 100 mg/dL      BUN 34.7 mg/dL      Creatinine 1.2 mg/dL      Calcium 7.5 mg/dL      Sodium 425 mEq/L      Potassium 5.4 mEq/L      Chloride 116 mEq/L      CO2 18 mEq/L     GFR [[id number]] Collected:  01/13/18 2015     Updated:  01/13/18 2050     EGFR 57.0    CBC without differential [[id number]]  (Abnormal) Collected:  01/13/18 2015    Specimen:  Blood Updated:  01/13/18 2034     WBC 6.22 x10 3/uL      Hgb 7.0 g/dL      Hematocrit 16.6 %      Platelets 230 x10 3/uL      RBC 2.37 x10 6/uL  MCV 100.0 fL      MCH 29.5 pg      MCHC 29.5 g/dL      RDW 18 %      MPV 10.4 fL      Nucleated RBC 0.0 /100 WBC      Absolute NRBC 0.00 x10 3/uL       Ct Abdomen Pelvis Wo Iv/ Wo Po Contrast    Result Date: 01/09/2018   1. Moderate-sized bilateral pleural effusions with adjacent atelectasis 2. Small left adrenal nodule likely adenoma 3. Multiple small hepatic cysts 4. No gross malignancy or adenopathy identified on this nonenhanced CT J. Carole Binning, MD 01/09/2018 4:40 PM    Ct Head Without Contrast    Result Date: 01/07/2018  1. No acute intracranial hemorrhage is detected. 2. There is mild subcortical, deep,  and periventricular leukomalacia in both cerebral hemispheres as well as in the infratentorial white matter.   This is suggestive of chronic small vessel ischemic change. Theodoro Doing, MD 01/07/2018 11:36 AM    Ct Chest Without Contrast    Result Date: 01/07/2018  1. Multiple bilateral pulmonary nodules measuring up to 7 mm in diameter 2. Sclerotic bony abnormality in mid thoracic spine, posterior ribs, and sternum suspicious for metastatic disease J. Carole Binning, MD 01/07/2018 11:55 AM    Nm Bone Scan Whole Body    Result Date: 01/10/2018   Multifocal osseous metastatic disease. Collene Schlichter, MD 01/10/2018 4:51 PM    Ct Guided Biopsy Bone Deep Pq Ndl    Result Date: 01/12/2018   CT guided biopsy of left iliac bone sclerotic lesion. Emeline Darling, MD 01/12/2018 4:16 PM    Assessment & Plan     1. Metastatic Prostate cancer  S/p CT guided Biopsy , Elevated PSA, as per Oncology  Positive Bone Scan  Palliative care consult    2.  GI bleeding  Hold aspirin and Plavix  Last GI work-up was in September 2019 revealing multiple AV malformation each were cauterized GI suspect bleeding from the AVMs    3.  Acute on chronic anemia  Iron TIBC B12 levels  Hematology consult    4.  Multiple bilateral pulmonary nodules  CT chest showed multiple pulmonary nodules measuring up to 7 mm in diameter and sclerotic bony abnormality in mid thoracic spine posterior ribs sternum suspicious for malignancy  Consult hematology for malignancy work-up    5. AKI   Stable     6.  History of CAD  Hold aspirin Plavix due to GI bleed    7.   Atrial fibrillation  On amiodarone    8.  Hypertension  Stable    9.  DVT prophylaxis  SCD      Schuyler Amor  01/14/2018

## 2018-01-14 NOTE — Plan of Care (Signed)
Problem: Safety  Goal: Patient will be free from injury during hospitalization  Outcome: Progressing  Flowsheets (Taken 01/14/2018 0340)  Patient will be free from injury during hospitalization : Assess patient's risk for falls and implement fall prevention plan of care per policy;Use appropriate transfer methods;Provide and maintain safe environment;Ensure appropriate safety devices are available at the bedside;Include patient/ family/ care giver in decisions related to safety;Hourly rounding  Goal: Patient will be free from infection during hospitalization  Outcome: Progressing  Flowsheets (Taken 01/14/2018 0340)  Free from Infection during hospitalization: Monitor all insertion sites (i.e. indwelling lines, tubes, urinary catheters, and drains);Monitor lab/diagnostic results;Encourage patient and family to use good hand hygiene technique     Problem: Pain  Goal: Pain at adequate level as identified by patient  Outcome: Progressing  Flowsheets (Taken 01/14/2018 0340)  Pain at adequate level as identified by patient: Identify patient comfort function goal;Assess for risk of opioid induced respiratory depression, including snoring/sleep apnea. Alert healthcare team of risk factors identified.;Assess pain on admission, during daily assessment and/or before any "as needed" intervention(s);Evaluate if patient comfort function goal is met;Reassess pain within 30-60 minutes of any procedure/intervention, per Pain Assessment, Intervention, Reassessment (AIR) Cycle     Problem: Side Effects from Pain Analgesia  Goal: Patient will experience minimal side effects of analgesic therapy  Outcome: Progressing  Flowsheets (Taken 01/14/2018 0340)  Patient will experience minimal side effects of analgesic therapy: Monitor/assess patient's respiratory status (RR depth, effort, breath sounds);Assess for changes in cognitive function;Prevent/manage side effects per LIP orders (i.e. nausea, vomiting, pruritus, constipation, urinary  retention, etc.);Evaluate for opioid-induced sedation with appropriate assessment tool (i.e. POSS)     Problem: Discharge Barriers  Goal: Patient will be discharged home or other facility with appropriate resources  Outcome: Progressing  Flowsheets (Taken 01/14/2018 2312)  Discharge to home or other facility with appropriate resources: Provide appropriate patient education; Provide information on available health resources     Problem: Psychosocial and Spiritual Needs  Goal: Demonstrates ability to cope with hospitalization/illness  Outcome: Progressing  Flowsheets (Taken 01/14/2018 0340)  Demonstrates ability to cope with hospitalizations/illness: Encourage verbalization of feelings/concerns/expectations;Provide quiet environment;Encourage patient to set small goals for self;Include patient/ patient care companion in decisions;Encourage participation in diversional activity     Problem: Moderate/High Fall Risk Score >5  Goal: Patient will remain free of falls  Outcome: Progressing  Flowsheets (Taken 01/14/2018 2100)  High (Greater than 13): HIGH-Activate bed/chair exit alarm where available;HIGH-Initiate use of floor mats as appropriate;HIGH-Consider use of low bed     Problem: Compromised Tissue integrity  Goal: Damaged tissue is healing and protected  Outcome: Progressing  Flowsheets (Taken 01/14/2018 0340)  Damaged tissue is healing and protected : Monitor/assess Braden scale every shift;Provide wound care per wound care algorithm;Reposition patient every 2 hours and as needed unless able to reposition self;Relieve pressure to bony prominences for patients at moderate and high risk;Increase activity as tolerated/progressive mobility;Avoid shearing injuries;Use bath wipes, not soap and water, for daily bathing;Use incontinence wipes for cleaning urine, stool and caustic drainage. Foley care as needed;Encourage use of lotion/moisturizer on skin  Goal: Nutritional status is improving  Outcome:  Progressing  Flowsheets (Taken 01/14/2018 0340)  Nutritional status is improving: Assist patient with eating;Allow adequate time for meals;Encourage patient to take dietary supplement(s) as ordered;Collaborate with Clinical Nutritionist     Problem: Hemodynamic Status: Cardiac  Goal: Stable vital signs and fluid balance  Outcome: Progressing  Flowsheets (Taken 01/14/2018 0340)  Stable vital signs and fluid balance: Monitor/assess vital  signs and telemetry per unit protocol;Assess signs and symptoms associated with cardiac rhythm changes;Weigh on admission and record weight daily     Problem: Inadequate Tissue Perfusion  Goal: Adequate tissue perfusion will be maintained  Outcome: Progressing  Flowsheets (Taken 01/14/2018 0340)  Adequate tissue perfusion will be maintained: Monitor/assess vital signs;Monitor/assess lab values and report abnormal values;Monitor/assess neurovascular status (pulses, capillary refill, pain, paresthesia, paralysis, presence of edema);Monitor intake and output;Monitor for signs and symptoms of a pulmonary embolism (dyspnea, tachypnea, tachycardia, confusion);Monitor/assess for signs of VTE (edema of calf/thigh redness, pain)     Problem: Altered GI Function  Goal: Fluid and electrolyte balance are achieved/maintained  Outcome: Progressing  Flowsheets (Taken 01/14/2018 0340)  Fluid and electrolyte balance are achieved/maintained: Monitor intake and output every shift;Provide adequate hydration;Monitor/assess lab values and report abnormal values;Assess for confusion/personality changes;Monitor daily weight  Goal: Elimination patterns are normal or improving  Outcome: Progressing  Flowsheets (Taken 01/14/2018 0340)  Elimination patterns are normal or improving: Report abnormal assessment to physician;Anticipate/assist with toileting needs;Assess for normal bowel sounds;Monitor for abdominal distension;Monitor for abdominal discomfort  Goal: Nutritional intake is adequate  Outcome:  Progressing  Flowsheets (Taken 01/14/2018 0340)  Nutritional intake is adequate: Monitor daily weights;Allow adequate time for meals;Encourage/administer dietary supplements as ordered (i.e. tube feed, TPN, oral, OGT/NGT, supplements);Encourage/perform oral hygiene as appropriate;Assist patient with meals/food selection  Goal: Mobility/Activity is maintained at optimal level for patient  Outcome: Progressing  Flowsheets (Taken 01/14/2018 0340)  Mobility/activity is maintained at optimal level for patient: Increase mobility as tolerated/progressive mobility;Perform active/passive ROM;Maintain proper body alignment;Encourage independent activity per ability;Reposition patient every 2 hours and as needed unless able to reposition self  Goal: No bleeding  Outcome: Progressing  Flowsheets (Taken 01/14/2018 0340)  No bleeding : Monitor and assess vitals and hemodynamic parameters;Monitor/assess lab values and report abnormal values;Assess for bruising/petechia

## 2018-01-15 LAB — BASIC METABOLIC PANEL
BUN: 29 mg/dL — ABNORMAL HIGH (ref 9.0–28.0)
CO2: 21 mEq/L — ABNORMAL LOW (ref 22–29)
Calcium: 7.9 mg/dL (ref 7.9–10.2)
Chloride: 112 mEq/L — ABNORMAL HIGH (ref 100–111)
Creatinine: 1.2 mg/dL (ref 0.7–1.3)
Glucose: 97 mg/dL (ref 70–100)
Potassium: 5.1 mEq/L (ref 3.5–5.1)
Sodium: 139 mEq/L (ref 136–145)

## 2018-01-15 LAB — GFR: EGFR: 57

## 2018-01-15 LAB — MAGNESIUM: Magnesium: 2.2 mg/dL (ref 1.6–2.6)

## 2018-01-15 MED ORDER — BICALUTAMIDE 50 MG PO TABS
50.00 mg | ORAL_TABLET | Freq: Every day | ORAL | 0 refills | Status: AC
Start: 2018-01-15 — End: ?

## 2018-01-15 MED ORDER — BICALUTAMIDE 50 MG PO TABS
50.00 mg | ORAL_TABLET | Freq: Every day | ORAL | 0 refills | Status: DC
Start: 2018-01-15 — End: 2018-01-15

## 2018-01-15 NOTE — Discharge Instr - AVS First Page (Addendum)
Reason for your Hospital Admission:  Weakness and dizziness       Instructions for after your discharge:  Follow up with hematology

## 2018-01-15 NOTE — Progress Notes (Signed)
[name] is a 84 y.o. male patient.  Active Problems:    GI bleed  [name] is a 84 y.o. 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  The patient was recently admitted from 12/03/17-12/12/17 for anemia and GI bleed.  EGD at the time showed many angioectasias of the stomach and duodenum some of which were actively oozing and ablated.  He also had epistaxis which required packing by ENT during that admission.  After admission, he restarted plavix and was doing well up until the last week.  He has been progressively more dizzy and weak and has fallen several times due to his legs buckling.  He says he never lost consciousness, no bowel or bladder incontinence, no tongue biting.  He has noticed dark black stool but is on iron and attributed the dark stools to iron.  He vomited yesterday but no hematemesis.  He denies CP, abdominal pain, SOB.  He has right sided tenderness to palpation due to his fall.  The patient has not taken any medication since Thursday because he hasn't felt well and in fact has stayed in bed for the last few days.   Past Medical History:   Diagnosis Date   . Anemia     iron low   . Black tarry stools 2019   . CKD (chronic kidney disease), stage III    . Coronary artery disease    . DOE (dyspnea on exertion)     mild   . Encounter for blood transfusion    . Glaucoma NEC    . Hyperlipidemia    . Hypertensive disorder    . Macrocytosis    . Paroxysmal A-fib    . Skin cancer     2006 removed mole   . SLE (systemic lupus erythematosus) 1990   . Ulcerative colitis, chronic    . Vision abnormalities      Current Facility-Administered Medications   Medication Dose Route Frequency Provider Last Rate Last Dose   . 0.9%  NaCl infusion   Intravenous PRN Cherlyn Cushing, MD       . 0.9%  NaCl infusion   Intravenous PRN Cherlyn Cushing, MD       . acetaminophen (TYLENOL)  suppository 650 mg  650 mg Rectal Q6H PRN Cherlyn Cushing, MD   650 mg at 01/07/18 1602   . amiodarone (PACERONE) tablet 100 mg  100 mg Oral Daily NgJilda Panda [place], [place]   100 mg at 01/14/18 0931   . atorvastatin (LIPITOR) tablet 80 mg  80 mg Oral QHS Amalia Hailey [place], [place]   80 mg at 01/14/18 2130   . bicalutamide (CASODEX) tablet 50 mg  50 mg Oral Daily Ahren Bellow, MD   50 mg at 01/14/18 0933   . dextrose (GLUCOSE) 40 % oral gel 15 g of glucose  15 g of glucose Oral PRN Cherlyn Cushing, MD        And   . dextrose 50 % bolus 12.5 g  12.5 g Intravenous PRN Cherlyn Cushing, MD        And   . glucagon (rDNA) (GLUCAGEN) injection 1 mg  1 mg Intramuscular PRN Cherlyn Cushing, MD       . docusate sodium (COLACE) capsule 100 mg  100 mg Oral Daily PRN Amanda Cockayne, MD   100 mg at  01/12/18 0818   . fentaNYL (PF) (SUBLIMAZE) injection 25 mcg  25 mcg Intravenous Q2H PRN Cherlyn Cushing, MD       . furosemide (LASIX) tablet 40 mg  40 mg Oral Daily Amalia Hailey [place], [place]   Stopped at 01/13/18 [phone]   . ondansetron (ZOFRAN) injection 4 mg  4 mg Intravenous Q8H PRN Cherlyn Cushing, MD   4 mg at 01/15/18 0521     No Known Allergies  Blood pressure 113/55, pulse (!) 48, temperature 98.2 F (36.8 C), temperature source Oral, resp. rate 16, weight 53.5 kg (117 lb 15.1 oz), SpO2 96 %.    Subjective  Objective:  General Appearance:  Comfortable and in no acute distress.    Vital signs: (most recent): Blood pressure 113/55, pulse (!) 48, temperature 98.2 F (36.8 C), temperature source Oral, resp. rate 16, weight 53.5 kg (117 lb 15.1 oz), SpO2 96 %.  Vital signs are normal.    Output: Producing urine.    HEENT: Normal HEENT exam.    Lungs:  Normal effort and normal respiratory rate.  Breath sounds clear to auscultation.    Heart: Normal rate.  Regular rhythm.  S1 normal.    Abdomen: Abdomen is soft.  Bowel sounds are normal.   There is no abdominal tenderness.     Extremities: Normal range of motion.     Pulses: Distal pulses are intact.    Neurological: Patient is alert.    Pupils:  Pupils are equal, round, and reactive to light.    Skin:  Warm, dry and pale.      Results     Procedure Component Value Units Date/Time    Basic Metabolic Panel [[id number]]  (Abnormal) Collected:  01/15/18 0500    Specimen:  Blood Updated:  01/15/18 0623     Glucose 97 mg/dL      BUN 62.1 mg/dL      Creatinine 1.2 mg/dL      Calcium 7.9 mg/dL      Sodium 308 mEq/L      Potassium 5.1 mEq/L      Chloride 112 mEq/L      CO2 21 mEq/L     GFR [[id number]] Collected:  01/15/18 0500     Updated:  01/15/18 0623     EGFR 57.0    Magnesium [[id number]] Collected:  01/15/18 0500    Specimen:  Blood Updated:  01/15/18 0623     Magnesium 2.2 mg/dL     CBC without differential [[id number]]  (Abnormal) Collected:  01/14/18 1246    Specimen:  Blood Updated:  01/14/18 1322     WBC 6.89 x10 3/uL      Hgb 7.9 g/dL      Hematocrit 66.4 %      Platelets 273 x10 3/uL      RBC 2.67 x10 6/uL      MCV 99.6 fL      MCH 29.6 pg      MCHC 29.7 g/dL      RDW 19 %      MPV 10.1 fL      Nucleated RBC 0.0 /100 WBC      Absolute NRBC 0.00 x10 3/uL       Ct Abdomen Pelvis Wo Iv/ Wo Po Contrast    Result Date: 01/09/2018   1. Moderate-sized bilateral pleural effusions with adjacent atelectasis 2. Small left adrenal nodule likely adenoma 3. Multiple small hepatic cysts 4. No gross malignancy or adenopathy identified on this nonenhanced  CT Kristine Linea, MD 01/09/2018 4:40 PM    Ct Head Without Contrast    Result Date: 01/07/2018  1. No acute intracranial hemorrhage is detected. 2. There is mild subcortical, deep, and periventricular leukomalacia in both cerebral hemispheres as well as in the infratentorial white matter.   This is suggestive of chronic small vessel ischemic change. Theodoro Doing, MD 01/07/2018 11:36 AM    Ct Chest Without Contrast    Result Date: 01/07/2018  1. Multiple bilateral pulmonary nodules measuring up to 7 mm in diameter 2. Sclerotic bony  abnormality in mid thoracic spine, posterior ribs, and sternum suspicious for metastatic disease J. Carole Binning, MD 01/07/2018 11:55 AM    Nm Bone Scan Whole Body    Result Date: 01/10/2018   Multifocal osseous metastatic disease. Collene Schlichter, MD 01/10/2018 4:51 PM    Ct Guided Biopsy Bone Deep Pq Ndl    Result Date: 01/12/2018   CT guided biopsy of left iliac bone sclerotic lesion. Emeline Darling, MD 01/12/2018 4:16 PM    Assessment & Plan     1. Metastatic Prostate cancer  S/p CT guided Biopsy , Elevated PSA, as per Oncology  Positive Bone Scan  Palliative care consult    2.  GI bleeding  Hold aspirin and Plavix  Last GI work-up was in September 2019 revealing multiple AV malformation each were cauterized GI suspect bleeding from the AVMs    3.  Acute on chronic anemia  S/p BMBx pending results   H/h stable     4.  Multiple bilateral pulmonary nodules  CT chest showed multiple pulmonary nodules measuring up to 7 mm in diameter and sclerotic bony abnormality in mid thoracic spine posterior ribs sternum suspicious for malignancy  Consult hematology for malignancy work-up    5. AKI   Stable     6.  History of CAD  Hold aspirin Plavix due to GI bleed    7.   Atrial fibrillation  On amiodarone    8.  Hypertension  Stable    9.  DVT prophylaxis  SCD    Discharge planning today with follow up with hematology in one week     Schuyler Amor MD  [phone]  01/15/2018

## 2018-01-15 NOTE — Progress Notes (Signed)
Medicine Shift Note    Braden Scale Score: 19 (01/14/18 2100)  Skin Integrity: Bruising, Tear  Abrasion Skin Location: bilateral elbows, head  Bruising Skin Location: BUE  Redness Skin Location: sacrum, ack, buttocks  Patient Lines/Drains/Airways Status    Active Lines, Drains and Airways     Name:   Placement date:   Placement time:   Site:   Days:    Peripheral IV 01/07/18 Right Antecubital   01/07/18    1057    Antecubital   7    Peripheral IV 01/07/18 Left Antecubital   01/07/18    1925    Antecubital   7               Wound 01/07/18 Skin Tear Elbow Left (Active)   Date First Assessed/Time First Assessed: 01/07/18 1400   Wound Type: Skin Tear  Location: Elbow  Wound Location Orientation: Left  Present on Admission: Yes      Assessments 01/07/2018  2:00 PM 01/12/2018 10:00 PM   Site Description Red;Pink -   Peri-wound Description Maroon;Purple;Red -   Drainage Amount Scant -   Drainage Description Thin -   Treatments Site care;Cleansed -   Dressing Silicone Adhesive Foam -   Dressing Changed New Changed   Dressing Status Clean;Dry;Intact Clean;Dry;Intact       No Linked orders to display       Wound 01/07/18 Skin Tear Elbow Right (Active)   Date First Assessed/Time First Assessed: 01/07/18 1401   Wound Type: Skin Tear  Location: Elbow  Wound Location Orientation: Right  Present on Admission: Yes      Assessments 01/07/2018  2:00 PM 01/12/2018 10:00 PM   Site Description Pink;Red -   Peri-wound Description Pink;Red;Maroon -   Drainage Amount Scant -   Drainage Description Thin -   Treatments Site care;Cleansed -   Dressing Silicone Adhesive Foam -   Dressing Changed New Changed   Dressing Status Clean;Dry;Intact Clean;Dry;Intact       No Linked orders to display       Wound 01/07/18 Traumatic Head Posterior;Upper laceration (Active)   Date First Assessed/Time First Assessed: 01/07/18 1400   Wound Type: Traumatic  Location: Head  Wound Location Orientation: Posterior;Upper  Wound Description (Comments): laceration   Present on Admission: Yes      Assessments 01/07/2018  2:00 PM 01/09/2018 11:09 PM   Site Description Black;Maroon;Red Black;Maroon   Peri-wound Description Red;Pink Pink;Red   Drainage Amount Scant None   Drainage Description Thin -   Treatments Site care;Cleansed -   Dressing Open to air Cadexomer Iodine Gel;Open to air   Dressing Status Dry;Intact -       No Linked orders to display       Wound 01/12/18 Surgical Incision Hip Left biopsy incision (Active)   Date First Assessed/Time First Assessed: 01/12/18 2000   Wound Type: Surgical Incision  Location: Hip  Wound Location Orientation: Left  Wound Description (Comments): biopsy incision  Present on Admission: No      Assessments 01/13/2018  2:00 AM   Dressing Status Dry;Intact;Old drainage       No Linked orders to display       Last BM:01/14/2018    Pending Orders: labs    Discharge Plan: when medically stable    POC: Patient    Skin: redness to upper back and sacrum    Tele: no    Activity: pt oob with assist x1    PT/OT: 01/13/2018    Interpreter  Needs: n/a    Shift Note: pt A&Ox4. Vss, denies pain at this time. Pt c/o nausea this am. Iv zofran given with some relief. All safety measures in place per protocol. Will continue to monitor.

## 2018-01-15 NOTE — Plan of Care (Signed)
Problem: Safety  Goal: Patient will be free from injury during hospitalization  Outcome: Progressing  Flowsheets (Taken 01/15/2018 1121)  Patient will be free from injury during hospitalization : Assess patient's risk for falls and implement fall prevention plan of care per policy; Ensure appropriate safety devices are available at the bedside; Assess for patients risk for elopement and implement Elopement Risk Plan per policy; Provide alternative method of communication if needed (communication boards, writing); Include patient/ family/ care giver in decisions related to safety; Provide and maintain safe environment; Use appropriate transfer methods; Hourly rounding  Goal: Patient will be free from infection during hospitalization  Outcome: Progressing  Flowsheets (Taken 01/15/2018 1121)  Free from Infection during hospitalization: Assess and monitor for signs and symptoms of infection; Monitor all insertion sites (i.e. indwelling lines, tubes, urinary catheters, and drains); Monitor lab/diagnostic results; Encourage patient and family to use good hand hygiene technique     Problem: Pain  Goal: Pain at adequate level as identified by patient  Outcome: Progressing  Flowsheets (Taken 01/15/2018 1121)  Pain at adequate level as identified by patient: Identify patient comfort function goal; Assess for risk of opioid induced respiratory depression, including snoring/sleep apnea. Alert healthcare team of risk factors identified.; Assess pain on admission, during daily assessment and/or before any "as needed" intervention(s); Reassess pain within 30-60 minutes of any procedure/intervention, per Pain Assessment, Intervention, Reassessment (AIR) Cycle; Consult/collaborate with Physical Therapy, Occupational Therapy, and/or Speech Therapy; Include patient/patient care companion in decisions related to pain management as needed     Problem: Side Effects from Pain Analgesia  Goal: Patient will experience minimal side effects  of analgesic therapy  Outcome: Progressing  Flowsheets (Taken 01/15/2018 1121)  Patient will experience minimal side effects of analgesic therapy: Monitor/assess patient's respiratory status (RR depth, effort, breath sounds); Prevent/manage side effects per LIP orders (i.e. nausea, vomiting, pruritus, constipation, urinary retention, etc.); Evaluate for opioid-induced sedation with appropriate assessment tool (i.e. POSS); Assess for changes in cognitive function     Problem: Discharge Barriers  Goal: Patient will be discharged home or other facility with appropriate resources  Outcome: Progressing  Flowsheets (Taken 01/15/2018 1121)  Discharge to home or other facility with appropriate resources: Provide appropriate patient education; Provide information on available health resources; Initiate discharge planning     Problem: Psychosocial and Spiritual Needs  Goal: Demonstrates ability to cope with hospitalization/illness  Outcome: Progressing  Flowsheets (Taken 01/15/2018 1121)  Demonstrates ability to cope with hospitalizations/illness: Encourage verbalization of feelings/concerns/expectations; Encourage patient to set small goals for self; Include patient/ patient care companion in decisions; Provide quiet environment; Encourage participation in diversional activity; Communicate referral to spiritual care as appropriate; Assist patient to identify own strengths and abilities; Reinforce positive adaptation of new coping behaviors     Problem: Moderate/High Fall Risk Score >5  Goal: Patient will remain free of falls  Outcome: Progressing  Flowsheets (Taken 01/15/2018 1121)  High (Greater than 13): HIGH-Consider use of low bed; HIGH-Initiate use of floor mats as appropriate  VH Moderate Risk (6-13): Remain with patient during toileting; Use chair-pad alarm device     Problem: Compromised Tissue integrity  Goal: Damaged tissue is healing and protected  Outcome: Progressing  Flowsheets (Taken 01/15/2018 1121)  Damaged  tissue is healing and protected : Monitor/assess Braden scale every shift; Increase activity as tolerated/progressive mobility; Keep intact skin clean and dry; Use bath wipes, not soap and water, for daily bathing; Monitor patient's hygiene practices; Avoid shearing injuries  Goal: Nutritional status is improving  Outcome: Progressing  Flowsheets (Taken 01/15/2018 1121)  Nutritional status is improving: Assist patient with eating; Include patient/patient care companion in decisions related to nutrition; Allow adequate time for meals; Encourage patient to take dietary supplement(s) as ordered; Collaborate with Clinical Nutritionist     Problem: Hemodynamic Status: Cardiac  Goal: Stable vital signs and fluid balance  Outcome: Progressing  Flowsheets (Taken 01/15/2018 1121)  Stable vital signs and fluid balance: Monitor/assess vital signs and telemetry per unit protocol; Assess signs and symptoms associated with cardiac rhythm changes; Monitor for leg swelling/edema and report to LIP if abnormal; Weigh on admission and record weight daily; Monitor intake/output per unit protocol and/or LIP order; Monitor lab values     Problem: Inadequate Tissue Perfusion  Goal: Adequate tissue perfusion will be maintained  Outcome: Progressing  Flowsheets (Taken 01/15/2018 1121)  Adequate tissue perfusion will be maintained: Monitor/assess vital signs; Monitor/assess lab values and report abnormal values; Monitor/assess neurovascular status (pulses, capillary refill, pain, paresthesia, paralysis, presence of edema); Monitor intake and output; Monitor/assess for signs of VTE (edema of calf/thigh redness, pain); VTE Prevention: Administer anticoagulant(s) and/or apply anti-embolism stockings/devices as ordered; Reinforce ankle pump exercises; Increase mobility as tolerated/progressive mobility; Elevate feet; Position patient for maximum circulation/cardiac output; Perform active/passive ROM; Place shoes or other foot protection on  patient; Assess and monitor skin integrity; Provide wound/skin care     Problem: Altered GI Function  Goal: Fluid and electrolyte balance are achieved/maintained  Outcome: Progressing  Flowsheets (Taken 01/15/2018 1121)  Fluid and electrolyte balance are achieved/maintained: Monitor intake and output every shift; Assess for confusion/personality changes; Observe for seizure activity and initiate seizure precautions if indicated; Monitor/assess lab values and report abnormal values; Monitor daily weight; Assess and reassess fluid and electrolyte status; Observe for cardiac arrhythmias; Provide adequate hydration; Monitor for muscle weakness  Goal: Elimination patterns are normal or improving  Outcome: Progressing  Flowsheets (Taken 01/15/2018 1121)  Elimination patterns are normal or improving: Report abnormal assessment to physician; Assess for normal bowel sounds; Encourage /perform oral hygiene as appropriate  Goal: Nutritional intake is adequate  Outcome: Progressing  Flowsheets (Taken 01/15/2018 1121)  Nutritional intake is adequate: Monitor daily weights; Include patient/patient care companion in decisions related to nutrition; Consult/collaborate with Speech Therapy (swallow evaluations); Assist patient with meals/food selection; Allow adequate time for meals; Assess anorexia, appetite, and amount of meal/food tolerated; Encourage/perform oral hygiene as appropriate; Consult/collaborate with Clinical Nutritionist  Goal: Mobility/Activity is maintained at optimal level for patient  Outcome: Progressing  Flowsheets (Taken 01/15/2018 1121)  Mobility/activity is maintained at optimal level for patient: Increase mobility as tolerated/progressive mobility; Plan activities to conserve energy, plan rest periods; Assess for changes in respiratory status, level of consciousness and/or development of fatigue; Encourage independent activity per ability; Maintain proper body alignment; Perform active/passive ROM;  Consult/collaborate with Physical Therapy and/or Occupational Therapy  Goal: No bleeding  Outcome: Progressing  Flowsheets (Taken 01/15/2018 1121)  No bleeding : Monitor and assess vitals and hemodynamic parameters; Monitor/assess lab values and report abnormal values; Assess for bruising/petechia

## 2018-01-15 NOTE — Progress Notes (Signed)
[facility] Discharge Note    Discharge to: home  Family present during discharge:daughter was present    Belonging: all belonging were taking by daughter  Discharge education to: patient and daughter  Discharging equipments: N/A  Discharging medication: script is given to daughter  IV: removed  Discharged ZOX:WRUEA chair  Discharge Note: patient is discharge home personal belonging given peripheral IV removed discharge instruction given and signed by daughter wheel chair ordered to [place] pt to [facility] patient tower.

## 2018-01-15 NOTE — Progress Notes (Signed)
Medicine Shift Note    Braden Scale Score: 19 (01/15/18 0900)  Skin Integrity: Bruising, Abrasion, Redness, Tear  Abrasion Skin Location: bilateral elbow, head  Bruising Skin Location: BUE  Redness Skin Location: sacrum, back,buttocks  Patient Lines/Drains/Airways Status    Active Lines, Drains and Airways     Name:   Placement date:   Placement time:   Site:   Days:    Peripheral IV 01/15/18 Right;Anterior Forearm   01/15/18    0632    Forearm   less than 1               Wound 01/07/18 Skin Tear Elbow Left (Active)   Date First Assessed/Time First Assessed: 01/07/18 1400   Wound Type: Skin Tear  Location: Elbow  Wound Location Orientation: Left  Present on Admission: Yes      Assessments 01/07/2018  2:00 PM 01/12/2018 10:00 PM   Site Description Red;Pink -   Peri-wound Description Maroon;Purple;Red -   Drainage Amount Scant -   Drainage Description Thin -   Treatments Site care;Cleansed -   Dressing Silicone Adhesive Foam -   Dressing Changed New Changed   Dressing Status Clean;Dry;Intact Clean;Dry;Intact       No Linked orders to display       Wound 01/07/18 Skin Tear Elbow Right (Active)   Date First Assessed/Time First Assessed: 01/07/18 1401   Wound Type: Skin Tear  Location: Elbow  Wound Location Orientation: Right  Present on Admission: Yes      Assessments 01/07/2018  2:00 PM 01/12/2018 10:00 PM   Site Description Pink;Red -   Peri-wound Description Pink;Red;Maroon -   Drainage Amount Scant -   Drainage Description Thin -   Treatments Site care;Cleansed -   Dressing Silicone Adhesive Foam -   Dressing Changed New Changed   Dressing Status Clean;Dry;Intact Clean;Dry;Intact       No Linked orders to display       Wound 01/07/18 Traumatic Head Posterior;Upper laceration (Active)   Date First Assessed/Time First Assessed: 01/07/18 1400   Wound Type: Traumatic  Location: Head  Wound Location Orientation: Posterior;Upper  Wound Description (Comments): laceration  Present on Admission: Yes      Assessments  01/07/2018  2:00 PM 01/09/2018 11:09 PM   Site Description Black;Maroon;Red Black;Maroon   Peri-wound Description Red;Pink Pink;Red   Drainage Amount Scant None   Drainage Description Thin -   Treatments Site care;Cleansed -   Dressing Open to air Cadexomer Iodine Gel;Open to air   Dressing Status Dry;Intact -       No Linked orders to display       Wound 01/12/18 Surgical Incision Hip Left biopsy incision (Active)   Date First Assessed/Time First Assessed: 01/12/18 2000   Wound Type: Surgical Incision  Location: Hip  Wound Location Orientation: Left  Wound Description (Comments): biopsy incision  Present on Admission: No      Assessments 01/13/2018  2:00 AM   Dressing Status Dry;Intact;Old drainage       No Linked orders to display       Last BM:10/27    Pending Orders:discharge  Discharge Plan:home  ZOX:WRUEAVW    Skin:tear, bruising and abraision  Tele:no  Activity: as tolerated  PT/OT:yes 10/25  Interpreter Needs:no    Shift Note: patient is alert and oriented denies pain, fall precaution and bleeding precaution in place no SOB discharge order, call light within reach floor mat on, bed in lower side monitor in progress.

## 2018-01-17 ENCOUNTER — Telehealth: Payer: Self-pay

## 2018-01-17 NOTE — Telephone Encounter (Signed)
Spoke to patient's daughter Thurston Hole and she is ok to reschedule to 11/14 2:00 pm (arrival 1:30 pm) with Dr. Carney Bern.

## 2018-01-18 NOTE — Discharge Summary (Signed)
DISCHARGE NOTE    Date Time: 01/18/18 12:06 PM  Patient Name: [name],[name]  Attending Physician: No att. providers found    Date of Admission:   01/07/2018    Date of Discharge:   01/15/18    Reason for Admission:   GI bleed [K92.2]  Anemia, unspecified type [D64.9]  Gastrointestinal hemorrhage, unspecified gastrointestinal hemorrhage type [K92.2]    Problems:   Lists the present on admission hospital problems  Present on Admission:  . GI bleed      Hospital Problems:  Active Problems:    GI bleed      Problem Lists:  Patient Active Problem List   Diagnosis   . GI bleed   . Anemia   . Iron deficiency anemia secondary to inadequate dietary iron intake   . Anemia in other chronic diseases classified elsewhere        Discharge Dx:   Lung nodule  .Anemia:,withlongstanding history mucosal angioectasia/ AVM  Bone lesion  CAD S/P PCI   Afib: AC on hold due to bleed   HTN  HLD: ON Statin    Consultations:   Hematology  Renal   Pulmonary  Cardiology   GI  Procedures performed:   [facility] bx     Hospital Course:   Admitted with weakness and dizziness s/p fall with h/h 4.6 due to GIB to ICU   Seen by GI   Transfused 3 units PRBC   Repeat h/h stable   Transferred to the medical floor   plavix is stopped given the overt bleeding from AVM per cardiology   He is cleared for discharge after BMBX with no AC  Stable h/h   Follow up with PCP, cardiology      Discharge Medications:     Discharge Medication List as of 01/15/2018  2:44 PM      CONTINUE these medications which have CHANGED    Details   bicalutamide (CASODEX) 50 MG tablet Take 1 tablet (50 mg total) by mouth daily, Starting Sun 01/15/2018, Print         CONTINUE these medications which have NOT CHANGED    Details   AMIODARONE HCL PO Take by mouth daily, Historical Med      atorvastatin (LIPITOR) 80 MG tablet Take 1 tablet (80 mg total) by mouth nightly., Starting Wed 02/23/2017, Print      furosemide (LASIX) 40 MG tablet Take 40 mg by mouth daily, Historical Med      IRON PO  Take 200 mg by mouth daily., Historical Med      pantoprazole (PROTONIX) 40 MG tablet Take 40 mg by mouth 2 (two) times daily, Historical Med         STOP taking these medications       clopidogrel (PLAVIX) 75 mg tablet        lisinopril (PRINIVIL,ZESTRIL) 5 MG tablet        metoprolol succinate XL (TOPROL-XL) 25 MG 24 hr tablet        octreotide (SANDOSTATIN) 100 MCG/ML Solution                Discharge Instructions:       Follow-up with PCP, cardiology , hematology       Signed by: Schuyler Amor MD  [phone]

## 2018-01-31 ENCOUNTER — Ambulatory Visit: Payer: 59 | Admitting: Hematology & Oncology

## 2018-01-31 NOTE — Progress Notes (Signed)
HEMATOLOGY    OFFICE VISIT NOTE   Date: 02/02/2018  Patient Name: [name],[name]    Reason for Consultation:   GI bleed.      History:   [name] is a 84 y.o. male who with a history of recurrent GI bleeding, found to have AVMs by prior ECD one year ago who presents to the hospital on 12/03/2017 with melena.  Also, now with nose bleed.  He is receiving his 4th unit of pRBCs.  Hb on 12/09/2017 was 8.9 g/dL. Previously shown to be iron and B12 deficient.  Iron profile on 01/09/18 demonstrates severe iron deficiency (TSat 6%) and Hb 7.9 g/dL.    Past Medical History:     Past Medical History:   Diagnosis Date   . Anemia     iron low   . Black tarry stools 2019   . CKD (chronic kidney disease), stage III    . Coronary artery disease    . DOE (dyspnea on exertion)     mild   . Encounter for blood transfusion    . Glaucoma NEC    . Hyperlipidemia    . Hypertensive disorder    . Macrocytosis    . Paroxysmal A-fib    . Skin cancer     2006 removed mole   . SLE (systemic lupus erythematosus) 1990   . Ulcerative colitis, chronic    . Vision abnormalities        Past Surgical History:     Past Surgical History:   Procedure Laterality Date   . CARDIAC SURGERY      3 stents   . EGD, COLONOSCOPY  04/03/2012    Procedure: EGD, COLONOSCOPY;  Surgeon: Lestine Mount, MD;  Location: [facility];  Service: Gastroenterology;  Laterality: N/A;   . EGD, COLONOSCOPY N/A 02/11/2017    Procedure: EGD, COLONOSCOPY;  Surgeon: Lestine Mount, MD;  Location: [facility];  Service: Gastroenterology;  Laterality: N/A;   . EGD, COLONOSCOPY N/A 12/08/2017    Procedure: EGD, COLONOSCOPY;  Surgeon: Jacqulyn Cane, MD;  Location: [id number];  Service: Gastroenterology;  Laterality: N/A;   . EYE SURGERY      cataract       Family History:   No family history on file.      Allergies:   No Known Allergies      Review of Systems:   HEMATOLOGY FOCUSED ROS:    General  no fever, chills, night sweats, generalized weakness or fatigue.   Heme: No  bleeding, bruising, rash, or swelling in the neck, underarms or groin.  No pain or swelling in upper or lower extremities.  All other systems were reviewed and were negative except as indicated in the HPI.      Physical Exam:     General appearance -alert/oriented and in no distress   Eyes - sclerae anicteric, slight conjunctival pallor   Neck - supple,without adenopathy   Chest - clear to auscultation  Heart - normal rate, regular rhythm, without murmur   Abdomen - soft, nontender, nondistended, no masses or organomegaly   Extremities - no pedal edema, no clubbing or cyanosis  Skin - without ecchymoses, petechiae or rash  Neurologic- without localizing deficit    Assessment:   Multiple mucosal angioectasias throughout GI and likely upper respiratory tract.  Recurrent GI bleeding - resulting in anemia.  Iron and B12 deficiency    Recommendations:   Agree with transfusion strategy to maintain stable Hb at 8 g/dL.  A candidate for outpatient parenteral iron (ferric carboxymaltose -Injectafer) - will arrange  May be a candidate for bevacizumab (Avastin) - would be administered as an outpatient.    Will follow.        ***Signed by:     Kimberlee Nearing Carney Bern, MD  HEMATOLOGY

## 2018-02-02 ENCOUNTER — Other Ambulatory Visit (FREE_STANDING_LABORATORY_FACILITY): Payer: Medicare Other

## 2018-02-02 ENCOUNTER — Ambulatory Visit: Payer: Medicare Other | Attending: Hematology & Oncology | Admitting: Hematology & Oncology

## 2018-02-02 ENCOUNTER — Other Ambulatory Visit: Payer: Self-pay

## 2018-02-02 ENCOUNTER — Encounter: Payer: Self-pay | Admitting: Hematology & Oncology

## 2018-02-02 ENCOUNTER — Telehealth: Payer: Self-pay

## 2018-02-02 VITALS — BP 142/61 | HR 68 | Temp 97.4°F | Ht 62.6 in | Wt 115.0 lb

## 2018-02-02 DIAGNOSIS — D508 Other iron deficiency anemias: Secondary | ICD-10-CM

## 2018-02-02 DIAGNOSIS — K922 Gastrointestinal hemorrhage, unspecified: Secondary | ICD-10-CM

## 2018-02-02 DIAGNOSIS — D509 Iron deficiency anemia, unspecified: Secondary | ICD-10-CM

## 2018-02-02 DIAGNOSIS — K9049 Malabsorption due to intolerance, not elsewhere classified: Secondary | ICD-10-CM

## 2018-02-02 LAB — COMPREHENSIVE METABOLIC PANEL
ALT: 19 U/L (ref 0–55)
AST (SGOT): 23 U/L (ref 5–34)
Albumin/Globulin Ratio: 1.2 (ref 0.9–2.2)
Albumin: 3.8 g/dL (ref 3.5–5.0)
Alkaline Phosphatase: 159 U/L — ABNORMAL HIGH (ref 38–106)
BUN: 27 mg/dL (ref 9.0–28.0)
Bilirubin, Total: 0.2 mg/dL (ref 0.1–1.2)
CO2: 18 mEq/L — ABNORMAL LOW (ref 21–29)
Calcium: 8.2 mg/dL (ref 7.9–10.2)
Chloride: 110 mEq/L (ref 100–111)
Creatinine: 1.5 mg/dL (ref 0.5–1.5)
Globulin: 3.2 g/dL (ref 2.0–3.7)
Glucose: 100 mg/dL (ref 70–100)
Potassium: 4.2 mEq/L (ref 3.5–5.1)
Protein, Total: 7 g/dL (ref 6.0–8.3)
Sodium: 137 mEq/L (ref 136–145)

## 2018-02-02 LAB — IRON PROFILE
Iron Saturation: 7 % — ABNORMAL LOW (ref 15–50)
Iron: 20 ug/dL — ABNORMAL LOW (ref 40–160)
TIBC: 297 ug/dL (ref 261–462)
UIBC: 277 ug/dL (ref 126–382)

## 2018-02-02 LAB — HEMOLYSIS INDEX: Hemolysis Index: 0 (ref 0–18)

## 2018-02-02 LAB — GFR: EGFR: 44.1

## 2018-02-02 NOTE — Progress Notes (Signed)
AUTHORIZATION REQUEST    REGIMEN: injectafer 750mg  x2     START DATE FOR TREATMENT: 11/21    LOCATION: [facility]    Dx D50.9 K90.49    Dr. Carney Bern

## 2018-02-02 NOTE — Progress Notes (Signed)
[facility]AND[facility] IS WORKING ON THIS

## 2018-02-02 NOTE — Progress Notes (Signed)
Treatment plan sent to insurance pool for authorization.

## 2018-02-02 NOTE — Telephone Encounter (Addendum)
injectafer scheduled at [facility] Friday 11/22 at 3:45pm    Attempted to contact pt on home number to review appt - unsure if pt or wife answered, they stated "they were not interested in listening to me" and hung up    Left detailed message on cell number reviewing appt  Advised to call back with questions

## 2018-02-03 LAB — CBC AND DIFFERENTIAL
Absolute NRBC: 0 10*3/uL (ref 0.00–0.00)
Basophils Absolute Automated: 0.03 10*3/uL (ref 0.00–0.08)
Basophils Automated: 0.5 %
Eosinophils Absolute Automated: 0.16 10*3/uL (ref 0.00–0.44)
Eosinophils Automated: 2.9 %
Hematocrit: 28.7 % — ABNORMAL LOW (ref 37.6–49.6)
Hgb: 8.6 g/dL — ABNORMAL LOW (ref 12.5–17.1)
Immature Granulocytes Absolute: 0.02 10*3/uL (ref 0.00–0.07)
Immature Granulocytes: 0.4 %
Lymphocytes Absolute Automated: 0.46 10*3/uL (ref 0.42–3.22)
Lymphocytes Automated: 8.2 %
MCH: 30.4 pg (ref 25.1–33.5)
MCHC: 30 g/dL — ABNORMAL LOW (ref 31.5–35.8)
MCV: 101.4 fL — ABNORMAL HIGH (ref 78.0–96.0)
MPV: 8.9 fL (ref 8.9–12.5)
Monocytes Absolute Automated: 0.59 10*3/uL (ref 0.21–0.85)
Monocytes: 10.5 %
Neutrophils Absolute: 4.35 10*3/uL (ref 1.10–6.33)
Neutrophils: 77.5 %
Nucleated RBC: 0 /100 WBC (ref 0.0–0.0)
Platelets: 448 10*3/uL — ABNORMAL HIGH (ref 142–346)
RBC: 2.83 10*6/uL — ABNORMAL LOW (ref 4.20–5.90)
RDW: 19 % — ABNORMAL HIGH (ref 11–15)
WBC: 5.61 10*3/uL (ref 3.10–9.50)

## 2018-02-07 ENCOUNTER — Telehealth: Payer: Self-pay | Admitting: Hematology & Oncology

## 2018-02-07 NOTE — Progress Notes (Signed)
Patient is all set.

## 2018-02-07 NOTE — Telephone Encounter (Signed)
LM for pts son advising pt is currently scheduled for injectafer 11/22 and 11/29, typical f/u post injectafer is 3-4 weeks for count recheck   Advised that pt would be unable to see Dr. Carney Bern but could be scheduled to see Tobi Bastos if they wanted to

## 2018-02-07 NOTE — Telephone Encounter (Signed)
Received call from patient's son, Norton, calling to schedule his dad's follow up appt.  Reviewed the charts didn't reflect a follow up timeframe.  The son just wanted to have a follow up visit for the patient prior to him being relocated [place] of [place] ([place]  Please follow up with the son, Lory @ [phone], if Dr. Carney Bern would like to see the patient again before 12/6.  DAW

## 2018-02-10 ENCOUNTER — Other Ambulatory Visit (INDEPENDENT_AMBULATORY_CARE_PROVIDER_SITE_OTHER): Payer: Self-pay | Admitting: Hematology & Oncology

## 2018-02-10 ENCOUNTER — Ambulatory Visit: Payer: Medicare Other | Attending: Hematology & Oncology

## 2018-02-10 VITALS — BP 121/61 | HR 55 | Temp 98.4°F | Resp 18 | Wt 113.0 lb

## 2018-02-10 DIAGNOSIS — K9049 Malabsorption due to intolerance, not elsewhere classified: Secondary | ICD-10-CM | POA: Insufficient documentation

## 2018-02-10 DIAGNOSIS — D509 Iron deficiency anemia, unspecified: Secondary | ICD-10-CM | POA: Insufficient documentation

## 2018-02-10 MED ORDER — SODIUM CHLORIDE 0.9 % IV SOLN
250.00 mL | INTRAVENOUS | Status: DC
Start: 2018-02-10 — End: 2018-02-10
  Administered 2018-02-10: 16:00:00 250 mL via INTRAVENOUS

## 2018-02-10 MED ORDER — FERRIC CARBOXYMALTOSE 750 MG/15ML IV SOLN
750.00 mg | Freq: Once | INTRAVENOUS | Status: AC
Start: 2018-02-10 — End: 2018-02-10
  Administered 2018-02-10: 16:00:00 750 mg via INTRAVENOUS
  Filled 2018-02-10: qty 15

## 2018-02-10 NOTE — Progress Notes (Signed)
Time 1545 pm    Daking Westervelt is a 84 y.o. male here for [facility].  Offers no complaints and says he is "feeling fine," denies fever, chills, extreme fatigue, is eating and drinking well, normal bowel movements.    PIV placed in R AC, flushed, and blood return noted before start of infusion.    Cycle 1 Day 1 completed today.    Information provided on the drug to patient and his daughter who was with him for visit.    Patient was asked to stay 30 minutes after infusion completion for monitoring.    See MAR, Doc Flowsheet and VS.  750 mg infused without incident.     RTC 02/17/18 for another infusion.    Discharged ambulatory in NAD with daughter, vitals stable.    Jeri Cos, RN

## 2018-02-17 ENCOUNTER — Ambulatory Visit: Payer: Medicare Other | Attending: Hematology & Oncology

## 2018-02-17 VITALS — BP 136/67 | HR 53 | Temp 98.3°F | Wt 116.2 lb

## 2018-02-17 DIAGNOSIS — D509 Iron deficiency anemia, unspecified: Secondary | ICD-10-CM

## 2018-02-17 DIAGNOSIS — K9049 Malabsorption due to intolerance, not elsewhere classified: Secondary | ICD-10-CM | POA: Insufficient documentation

## 2018-02-17 MED ORDER — ONDANSETRON HCL 8 MG PO TABS
8.0000 mg | ORAL_TABLET | Freq: Once | ORAL | Status: DC | PRN
Start: 2018-02-17 — End: 2018-02-17

## 2018-02-17 MED ORDER — EPINEPHRINE HCL 1 MG/ML ADULT ANAPHYLAXIS KIT
0.30 mg | Freq: Once | INTRAMUSCULAR | Status: DC | PRN
Start: 2018-02-17 — End: 2018-02-17

## 2018-02-17 MED ORDER — HYDROCORTISONE SOD SUC (PF) 100 MG IJ SOLR (WRAP)
100.00 mg | Freq: Once | INTRAMUSCULAR | Status: DC | PRN
Start: 2018-02-17 — End: 2018-02-17

## 2018-02-17 MED ORDER — ONDANSETRON IVPB 8 MG IN NS 50 ML
8.00 mg | Freq: Once | INTRAVENOUS | Status: DC | PRN
Start: 2018-02-17 — End: 2018-02-17

## 2018-02-17 MED ORDER — FAMOTIDINE 10 MG/ML IV SOLN (WRAP)
20.00 mg | Freq: Once | INTRAVENOUS | Status: DC | PRN
Start: 2018-02-17 — End: 2018-02-17

## 2018-02-17 MED ORDER — HEPARIN SOD (PORK) LOCK FLUSH 100 UNIT/ML IV SOLN
5.00 mL | INTRAVENOUS | Status: DC | PRN
Start: 2018-02-17 — End: 2018-02-17

## 2018-02-17 MED ORDER — DIPHENHYDRAMINE HCL 50 MG/ML IJ SOLN
25.00 mg | Freq: Once | INTRAMUSCULAR | Status: DC | PRN
Start: 2018-02-17 — End: 2018-02-17

## 2018-02-17 MED ORDER — ALBUTEROL SULFATE (2.5 MG/3ML) 0.083% IN NEBU
2.50 mg | INHALATION_SOLUTION | Freq: Once | RESPIRATORY_TRACT | Status: DC | PRN
Start: 2018-02-17 — End: 2018-02-17

## 2018-02-17 MED ORDER — SODIUM CHLORIDE 0.9 % IV SOLN
250.00 mL | INTRAVENOUS | Status: DC
Start: 2018-02-17 — End: 2018-02-17
  Administered 2018-02-17: 15:00:00 250 mL via INTRAVENOUS

## 2018-02-17 MED ORDER — DIPHENHYDRAMINE HCL 50 MG/ML IJ SOLN
50.00 mg | Freq: Once | INTRAMUSCULAR | Status: DC | PRN
Start: 2018-02-17 — End: 2018-02-17

## 2018-02-17 MED ORDER — SODIUM CHLORIDE 0.9 % IV BOLUS
500.00 mL | Freq: Once | INTRAVENOUS | Status: DC | PRN
Start: 2018-02-17 — End: 2018-02-17

## 2018-02-17 MED ORDER — SODIUM CHLORIDE 0.9 % IV SOLN
750.00 mg | Freq: Once | INTRAVENOUS | Status: AC
Start: 2018-02-17 — End: 2018-02-17
  Administered 2018-02-17: 16:00:00 750 mg via INTRAVENOUS
  Filled 2018-02-17: qty 15

## 2018-02-17 MED ORDER — SODIUM CHLORIDE (PF) 0.9 % IJ SOLN
5.00 mL | INTRAMUSCULAR | Status: DC | PRN
Start: 2018-02-17 — End: 2018-02-17

## 2018-02-17 MED ORDER — SODIUM CHLORIDE 0.9 % IV SOLN
25.00 mL/h | INTRAVENOUS | Status: DC | PRN
Start: 2018-02-17 — End: 2018-02-17

## 2018-02-17 NOTE — Progress Notes (Signed)
[name]  84 years old man came to [facility]  For Injectafer Infusion with his daughter.  He reports no significant discomfort.   Hx of GI bleeding   Dr. Hennie Duos.  Most Lab done 11/14. Hs was 8.6    PIV applied on LF arm  Injectafer given for 15 mins as ordered.   Post VS stable.  PIV removed.   Site covered with a dry gauze.     No RTC or Office visit.   Daughter is aware of that she has to call Dr. Carney Bern for F/U.

## 2020-11-10 ENCOUNTER — Emergency Department (HOSPITAL_COMMUNITY): Payer: Medicare Other

## 2020-11-10 ENCOUNTER — Inpatient Hospital Stay (HOSPITAL_COMMUNITY)
Admission: EM | Admit: 2020-11-10 | Discharge: 2020-11-13 | DRG: 064 | Disposition: A | Discharge status: Home Health | Payer: Medicare Other | Attending: Internal Medicine | Admitting: Internal Medicine

## 2020-11-10 ENCOUNTER — Other Ambulatory Visit: Payer: Self-pay

## 2020-11-10 DIAGNOSIS — C61 Malignant neoplasm of prostate: Secondary | ICD-10-CM | POA: Diagnosis not present

## 2020-11-10 DIAGNOSIS — M6282 Rhabdomyolysis: Secondary | ICD-10-CM | POA: Diagnosis present

## 2020-11-10 DIAGNOSIS — R4701 Aphasia: Secondary | ICD-10-CM | POA: Diagnosis present

## 2020-11-10 DIAGNOSIS — I480 Paroxysmal atrial fibrillation: Secondary | ICD-10-CM | POA: Diagnosis present

## 2020-11-10 DIAGNOSIS — I10 Essential (primary) hypertension: Secondary | ICD-10-CM | POA: Diagnosis present

## 2020-11-10 DIAGNOSIS — I440 Atrioventricular block, first degree: Secondary | ICD-10-CM | POA: Diagnosis present

## 2020-11-10 DIAGNOSIS — I639 Cerebral infarction, unspecified: Principal | ICD-10-CM

## 2020-11-10 DIAGNOSIS — R4182 Altered mental status, unspecified: Secondary | ICD-10-CM | POA: Diagnosis present

## 2020-11-10 DIAGNOSIS — Z20822 Contact with and (suspected) exposure to covid-19: Secondary | ICD-10-CM | POA: Diagnosis present

## 2020-11-10 DIAGNOSIS — R54 Age-related physical debility: Secondary | ICD-10-CM | POA: Diagnosis present

## 2020-11-10 DIAGNOSIS — R29703 NIHSS score 3: Secondary | ICD-10-CM | POA: Diagnosis present

## 2020-11-10 DIAGNOSIS — G9341 Metabolic encephalopathy: Secondary | ICD-10-CM | POA: Diagnosis present

## 2020-11-10 DIAGNOSIS — W19XXXA Unspecified fall, initial encounter: Secondary | ICD-10-CM | POA: Diagnosis present

## 2020-11-10 DIAGNOSIS — I2510 Atherosclerotic heart disease of native coronary artery without angina pectoris: Secondary | ICD-10-CM | POA: Diagnosis present

## 2020-11-10 DIAGNOSIS — C7951 Secondary malignant neoplasm of bone: Secondary | ICD-10-CM | POA: Diagnosis present

## 2020-11-10 DIAGNOSIS — I671 Cerebral aneurysm, nonruptured: Secondary | ICD-10-CM | POA: Diagnosis present

## 2020-11-10 DIAGNOSIS — E785 Hyperlipidemia, unspecified: Secondary | ICD-10-CM | POA: Diagnosis present

## 2020-11-10 DIAGNOSIS — Z8546 Personal history of malignant neoplasm of prostate: Secondary | ICD-10-CM

## 2020-11-10 LAB — CBC WITH DIFFERENTIAL/PLATELET
Abs Immature Granulocytes: 0.05 10*3/uL (ref 0.00–0.07)
Basophils Absolute: 0 10*3/uL (ref 0.0–0.1)
Basophils Relative: 0 %
Eosinophils Absolute: 0 10*3/uL (ref 0.0–0.5)
Eosinophils Relative: 0 %
HCT: 41.8 % (ref 39.0–52.0)
Hemoglobin: 14.1 g/dL (ref 13.0–17.0)
Immature Granulocytes: 0 %
Lymphocytes Relative: 3 %
Lymphs Abs: 0.5 10*3/uL — ABNORMAL LOW (ref 0.7–4.0)
MCH: 33 pg (ref 26.0–34.0)
MCHC: 33.7 g/dL (ref 30.0–36.0)
MCV: 97.9 fL (ref 80.0–100.0)
Monocytes Absolute: 1.3 10*3/uL — ABNORMAL HIGH (ref 0.1–1.0)
Monocytes Relative: 9 %
Neutro Abs: 13 10*3/uL — ABNORMAL HIGH (ref 1.7–7.7)
Neutrophils Relative %: 88 %
Platelets: 300 10*3/uL (ref 150–400)
RBC: 4.27 MIL/uL (ref 4.22–5.81)
RDW: 13.3 % (ref 11.5–15.5)
WBC: 14.8 10*3/uL — ABNORMAL HIGH (ref 4.0–10.5)
nRBC: 0 % (ref 0.0–0.2)

## 2020-11-10 LAB — URINALYSIS, ROUTINE W REFLEX MICROSCOPIC
Bacteria, UA: NONE SEEN
Bilirubin Urine: NEGATIVE
Glucose, UA: NEGATIVE mg/dL
Ketones, ur: 20 mg/dL — AB
Leukocytes,Ua: NEGATIVE
Nitrite: NEGATIVE
Protein, ur: 100 mg/dL — AB
Specific Gravity, Urine: 1.02 (ref 1.005–1.030)
pH: 6 (ref 5.0–8.0)

## 2020-11-10 LAB — COMPREHENSIVE METABOLIC PANEL
ALT: 22 U/L (ref 0–44)
AST: 44 U/L — ABNORMAL HIGH (ref 15–41)
Albumin: 3.7 g/dL (ref 3.5–5.0)
Alkaline Phosphatase: 62 U/L (ref 38–126)
Anion gap: 8 (ref 5–15)
BUN: 26 mg/dL — ABNORMAL HIGH (ref 8–23)
CO2: 24 mmol/L (ref 22–32)
Calcium: 8.7 mg/dL — ABNORMAL LOW (ref 8.9–10.3)
Chloride: 104 mmol/L (ref 98–111)
Creatinine, Ser: 1.28 mg/dL — ABNORMAL HIGH (ref 0.61–1.24)
GFR, Estimated: 53 mL/min — ABNORMAL LOW (ref >60)
Glucose, Bld: 114 mg/dL — ABNORMAL HIGH (ref 70–99)
Potassium: 3.9 mmol/L (ref 3.5–5.1)
Sodium: 136 mmol/L (ref 135–145)
Total Bilirubin: 1.3 mg/dL — ABNORMAL HIGH (ref 0.3–1.2)
Total Protein: 7.1 g/dL (ref 6.5–8.1)

## 2020-11-10 LAB — RESP PANEL BY RT-PCR (FLU A&B, COVID) ARPGX2
Influenza A by PCR: NEGATIVE
Influenza B by PCR: NEGATIVE
SARS Coronavirus 2 by RT PCR: NEGATIVE

## 2020-11-10 LAB — CK: Total CK: 999 U/L — ABNORMAL HIGH (ref 49–397)

## 2020-11-10 IMAGING — CT CT CERVICAL SPINE W/O CM
3 of 4 series · 13 of 33 positions shown, 16 images · non-contrast
Comparison: None.

CLINICAL DATA: Unwitnessed fall

EXAM:
CT CERVICAL SPINE WITHOUT CONTRAST
TECHNIQUE: Multidetector CT imaging of the cervical spine was performed without
intravenous contrast. Multiplanar CT image reconstructions were also
generated.

[Series 5: sagittal bone · sagittal · 0.35mm/px · 5 of 61 slices shown, 6 images]
[im 21/61  bone]
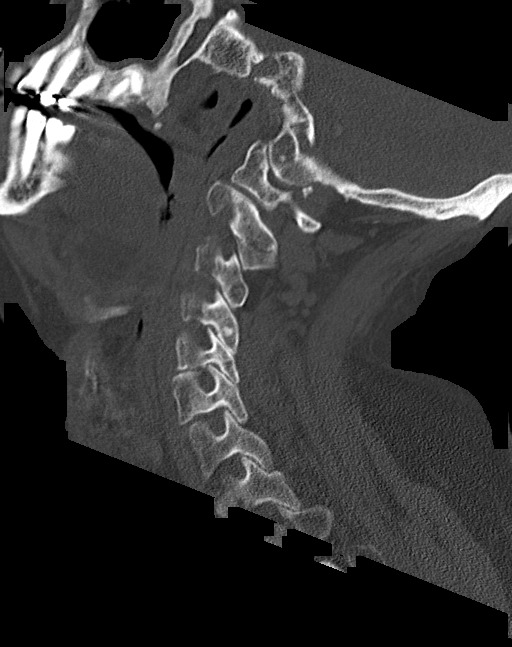
[im 26/61  bone]
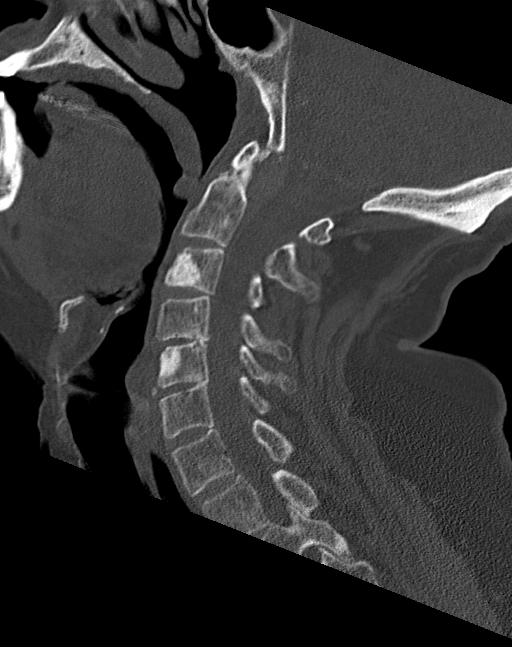
[im 31/61  soft-tissue]
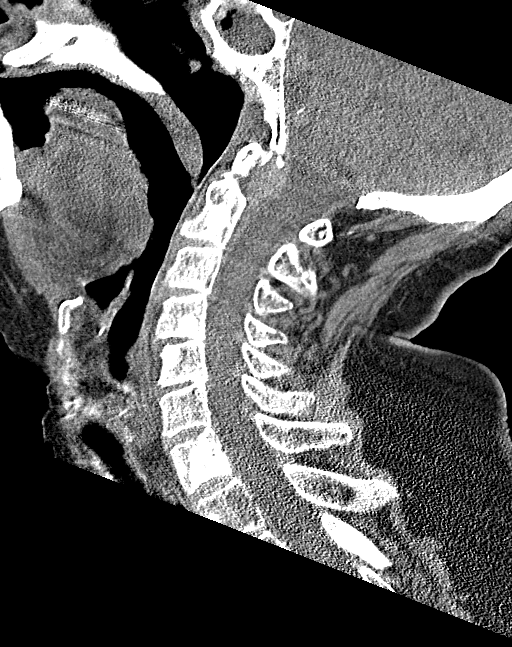
[im 31/61  bone]
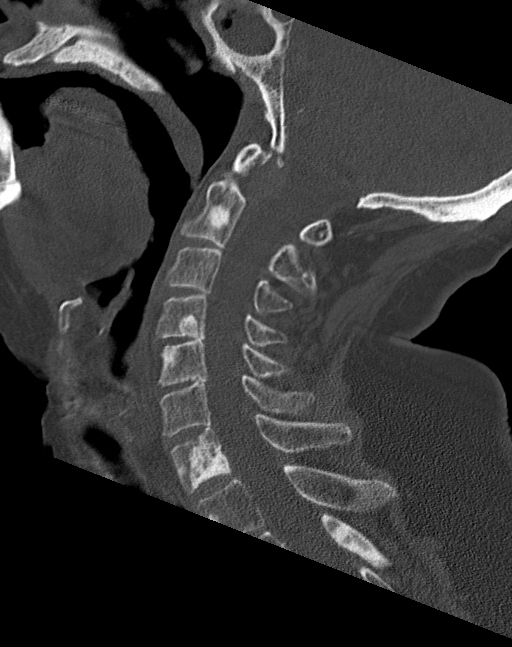
[im 36/61  bone]
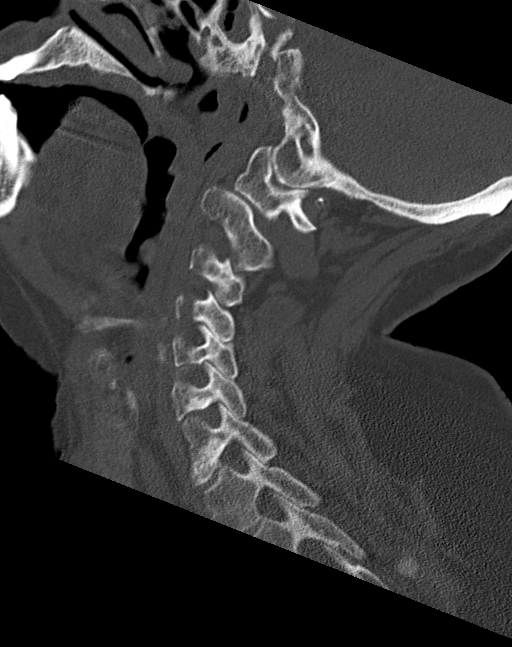
[im 41/61  bone]
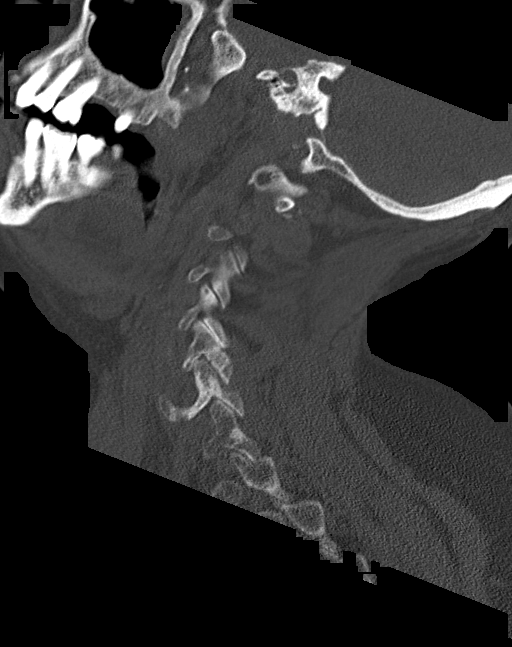

[Series 6: coronal bone · coronal · 0.32mm/px · 3 of 145 slices shown]
[im 29/145  bone]
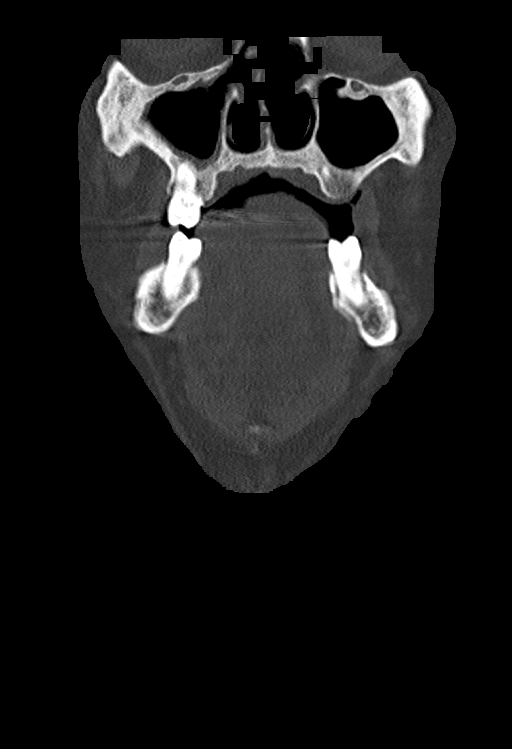
[im 58/145  bone]
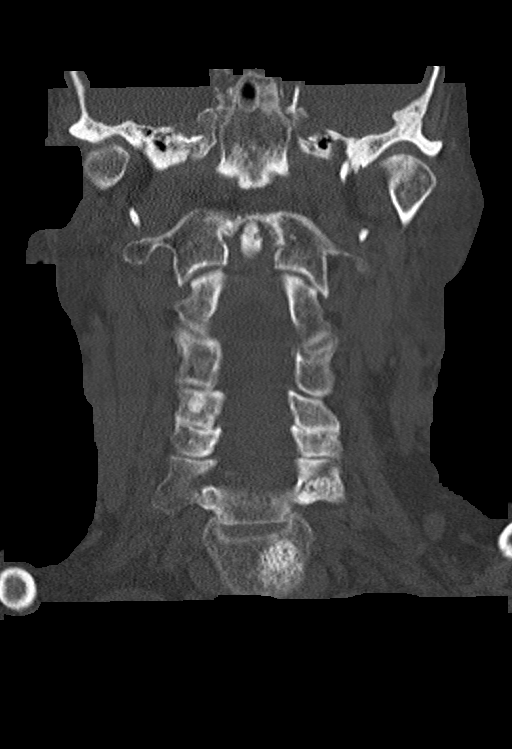
[im 87/145  bone]
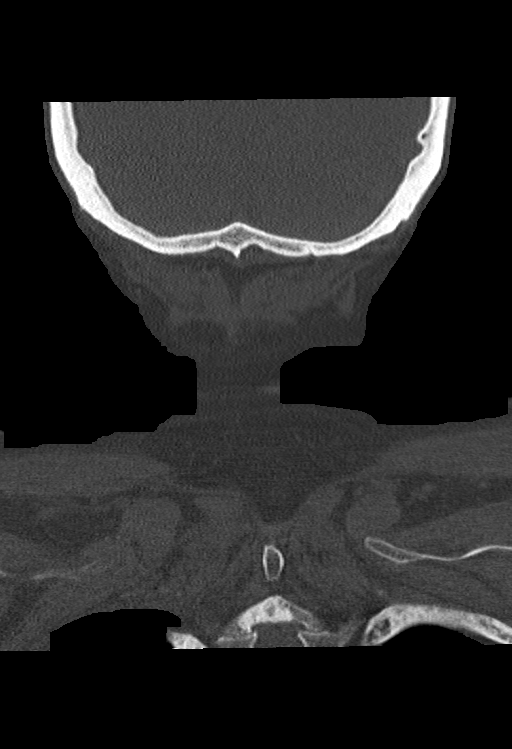

[Series 7: orthogonal axials · axial · 0.21mm/px · z∈[-76,+31]mm · 5 of 76 slices shown, 7 images]
[im 13/76  soft-tissue]
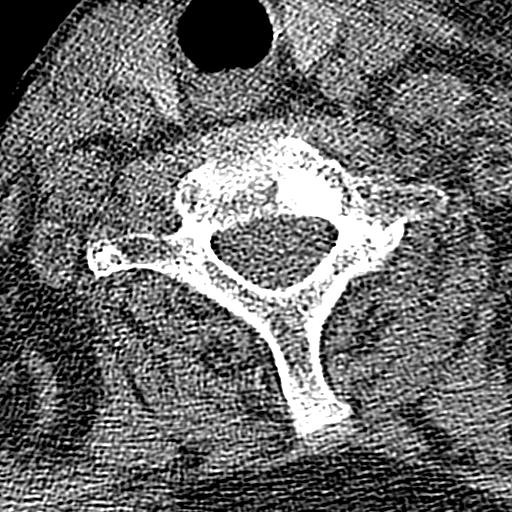
[im 13/76  bone]
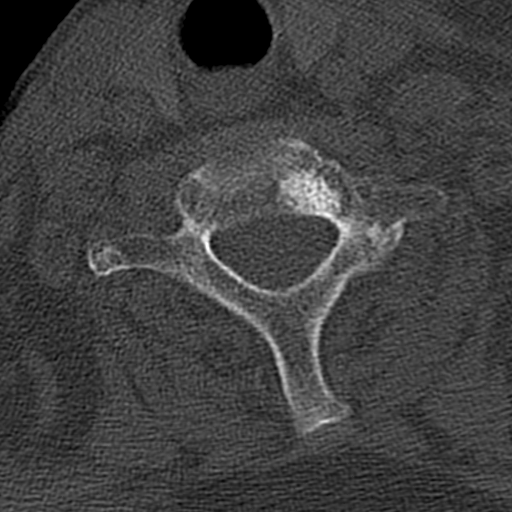
[im 26/76  bone]
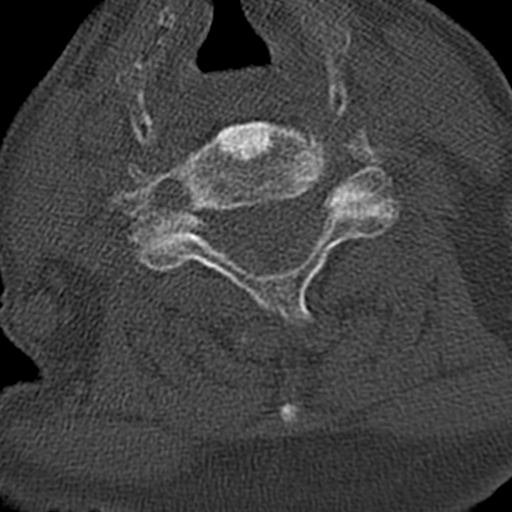
[im 38/76  bone]
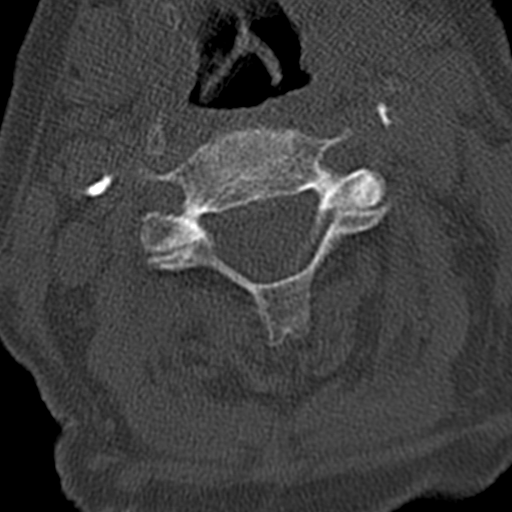
[im 51/76  bone]
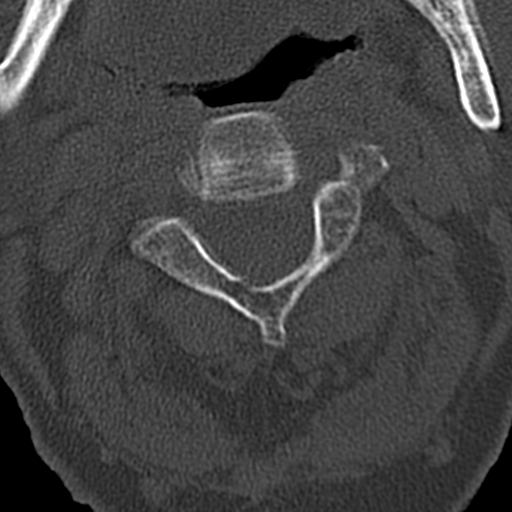
[im 63/76  soft-tissue]
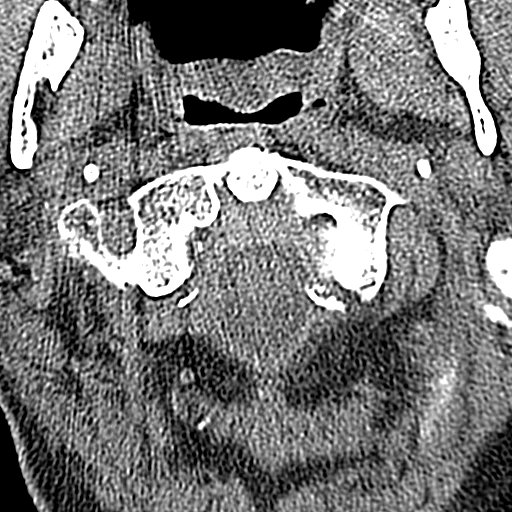
[im 63/76  bone]
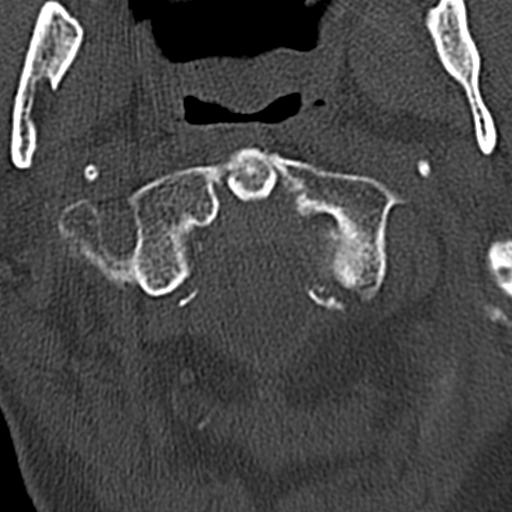

[13 of 33 positions shown; findings below may reference images not displayed]

FINDINGS: Alignment: No subluxation

Skull base and vertebrae: No fracture. There are multiple sclerotic
lesions throughout the cervical spine.

Soft tissues and spinal canal: No prevertebral fluid or swelling. No
visible canal hematoma.

Disc levels:  Maintained.  Degenerative facet disease.

Upper chest: Sclerotic lesions in the upper ribs.

Other: None
IMPRESSION: No evidence of fracture.

Numerous sclerotic lesions throughout the cervical spine and upper
ribs concerning for sclerotic metastases.

## 2020-11-10 IMAGING — DX DG CHEST 1V PORT
1 series · 1 of 1 positions shown · non-contrast
Comparison: None.

CLINICAL DATA: Altered mental status.  Unwitnessed fall.

EXAM:
PORTABLE CHEST 1 VIEW

[chest ap]
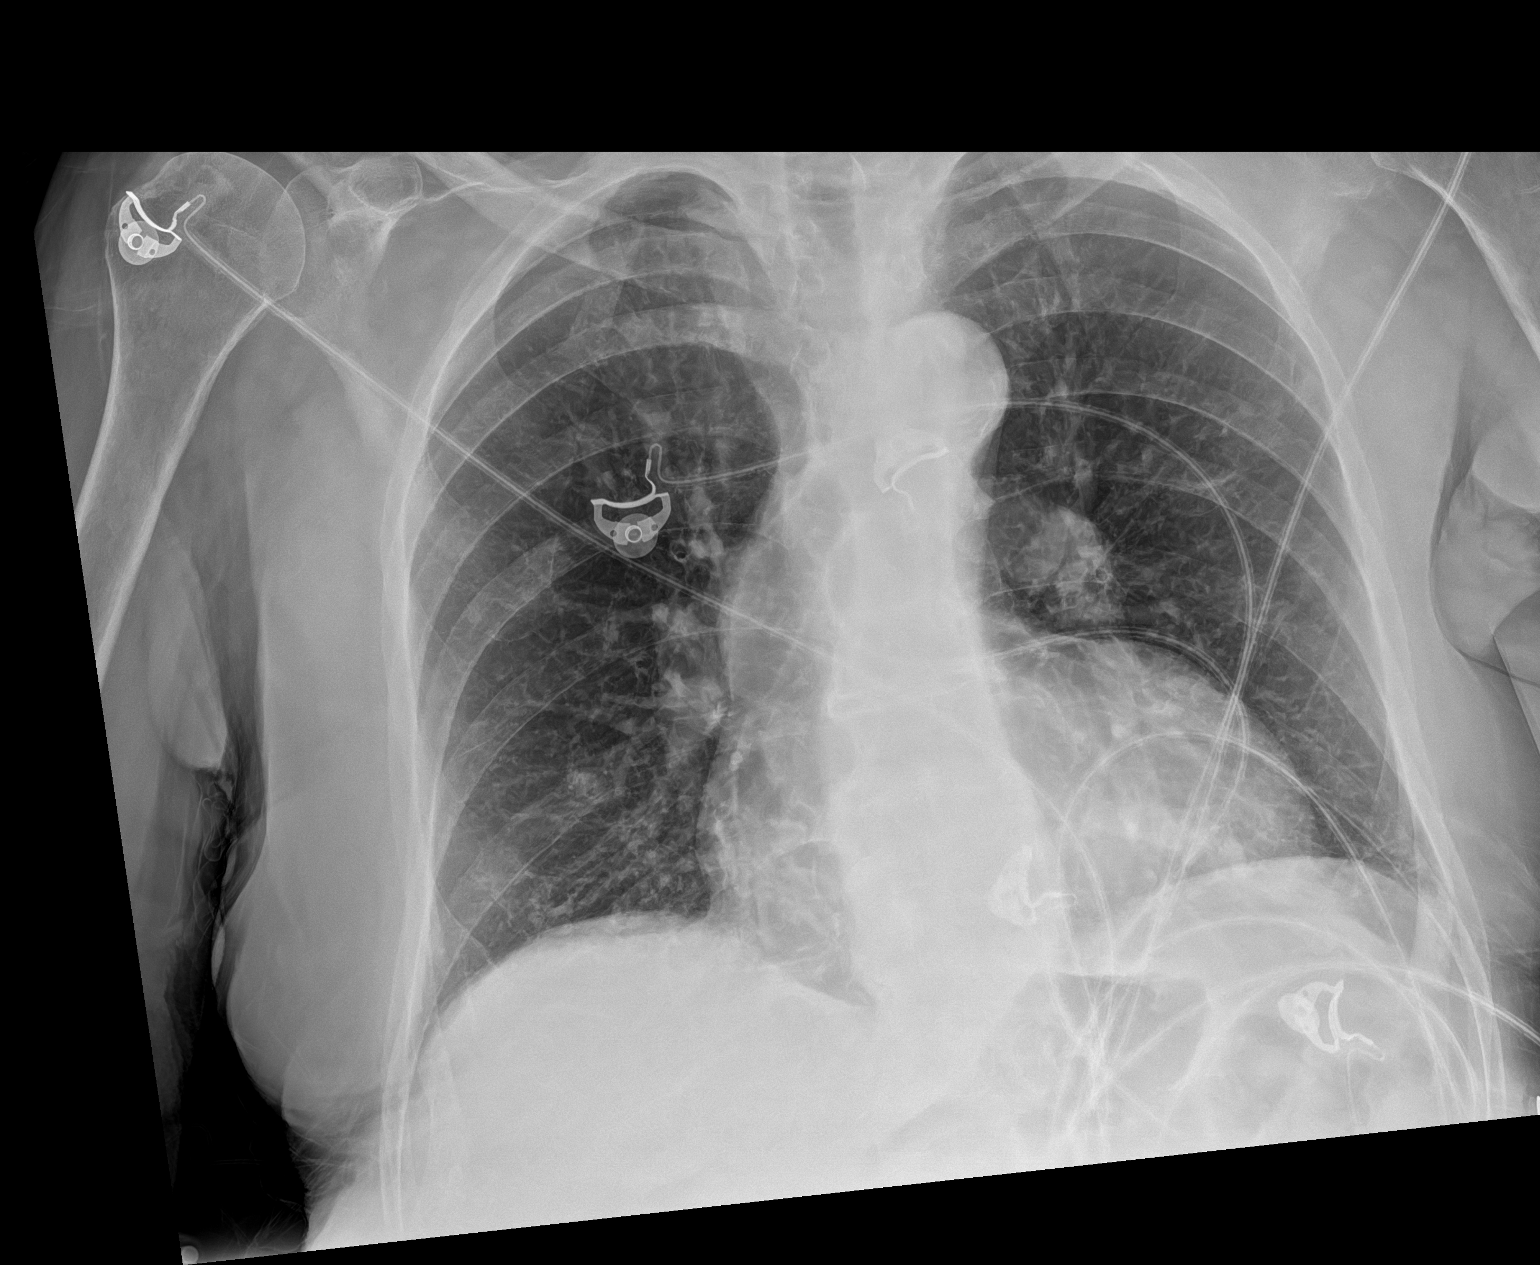

[1 of 1 positions shown; findings below may reference images not displayed]

FINDINGS: Shallow inspiration. Heart size and pulmonary vascularity are normal
for inspiratory effort. There is a mild interstitial pattern seen to
the lungs with some peribronchial thickening. This is likely chronic
bronchitis although early multifocal pneumonia could have this
appearance in the appropriate clinical setting. No focal
consolidation. No pleural effusions. No pneumothorax. Mediastinal
contours appear intact. Calcification of the aorta. Visualized bones
are nondepressed.
IMPRESSION: Chronic bronchitic changes in the lungs. No active pulmonary
disease.

## 2020-11-10 MED ORDER — ACETAMINOPHEN 650 MG RE SUPP: 650.0000 mg | Freq: Four times a day (QID) | RECTAL | Status: DC | PRN

## 2020-11-10 MED ORDER — ONDANSETRON HCL 4 MG PO TABS: 4.0000 mg | ORAL_TABLET | Freq: Four times a day (QID) | ORAL | Status: DC | PRN

## 2020-11-10 MED ORDER — ACETAMINOPHEN 325 MG PO TABS: 650.0000 mg | ORAL_TABLET | Freq: Four times a day (QID) | ORAL | Status: DC | PRN

## 2020-11-10 MED ORDER — ONDANSETRON HCL 4 MG/2ML IJ SOLN: 4.0000 mg | Freq: Four times a day (QID) | INTRAMUSCULAR | Status: DC | PRN

## 2020-11-10 MED ORDER — ENOXAPARIN SODIUM 40 MG/0.4ML IJ SOSY
40.0000 mg | PREFILLED_SYRINGE | INTRAMUSCULAR | Status: DC
  Administered 2020-11-12 – 2020-11-13 (×2): 40 mg via SUBCUTANEOUS
  Filled 2020-11-10 (×2): qty 0.4

## 2020-11-10 MED ORDER — POLYETHYLENE GLYCOL 3350 17 G PO PACK: 17.0000 g | PACK | Freq: Every day | ORAL | Status: DC | PRN

## 2020-11-10 MED ORDER — SODIUM CHLORIDE 0.9 % IV BOLUS
1000.0000 mL | Freq: Once | INTRAVENOUS | Status: AC
  Administered 2020-11-10: 1000 mL via INTRAVENOUS

## 2020-11-10 MED ORDER — SODIUM CHLORIDE 0.9 % IV SOLN: INTRAVENOUS | Status: DC

## 2020-11-10 NOTE — H&P (Signed)
History and Physical    [facility]z [id number] DOB: [birth year] DOA: 11/10/2020  PCP: Caleb Melter, MD   Patient coming from:   I have personally briefly reviewed patient's old medical records in [place]  Chief Complaint: [facility]  HPI: Caleb Nunez is a 87 y.o. male with medical history significant for metastatic prostate cancer, coronary artery disease, GI bleed from AVM, paroxysmal atrial fibrillation. Patient was brought to the ED from [facility] reports of altered mental status. Patient's daughter is at bedside and helps with the history, at the time of my evaluation patient is awake, but confused. Patient's daughter reports that today patient was found down on the floor and it was unknown how long patient had been on the floor.  Patient did not come out to get his lunch, so they checked on him and found him on the floor.  Daughter reports patient was dressed, so it is likely he had breakfast.  At baseline patient is awake alert and oriented, able to answer questions appropriately.  No history of memory problems.  Patient daughter Caleb Nunez last saw patient yesterday, he had no complaints, and has been doing well.  No history of seizures.  No headaches.   Triage notes- reports possible dried blood in mouth.  This was not seen.  I called assisted living, to confirm history and verify medications, no response.  ED Course: Respiratory 21-25, afebrile temperature 98.7.  Blood pressure 130s to 160s.  WBC 14.8.  Creatinine 1.28.  CK elevated at 999.  Head CT without acute abnormality.  Chest x-ray unremarkable.  UA not suggestive of infection.  500 mill bolus given.  Hospitalist to admit for AMS.  Review of Systems: Exam limited due to altered mental status.  Family history of hypertension.  Prior to Admission medications   Medication Sig Start Date End Date Taking? Authorizing Provider  amiodarone (PACERONE) 100 MG tablet Oral 05/30/19   Yes [provider]  calcium citrate-vitamin D (CITRACAL+D) 315-200 MG-UNIT tablet Take by mouth. 02/01/19  Yes [provider]  ferrous sulfate 325 (65 FE) MG EC tablet Take by mouth. 01/16/19  Yes [provider]  atorvastatin (LIPITOR) 80 MG tablet Take 80 mg by mouth daily. 05/29/20   [provider]  carvedilol (COREG) 3.125 MG tablet Oral    [provider]  [employer] VITAMIN B12 1000 MCG tablet Take 1,000 mcg by mouth daily. 10/03/20   [provider]  furosemide (LASIX) 40 MG tablet Oral    [provider]  leuprolide (LUPRON) 22.5 MG injection Inject into the muscle.    [provider]  lisinopril (ZESTRIL) 5 MG tablet Oral    [provider]    Physical Exam: Vitals:   11/10/20 1900 11/10/20 1950 11/10/20 2030 11/10/20 2100  BP: (!) 147/79 (!) 164/103 (!) 149/81 (!) 163/92  Pulse: 71 77 81 (!) 55  Resp: (!) 23 (!) 23 (!) 25 (!) 24  Temp:      TempSrc:      SpO2: 97% 100% 100% 93%  Weight:      Height:        Constitutional: Awake alert, confused Vitals:   11/10/20 1900 11/10/20 1950 11/10/20 2030 11/10/20 2100  BP: (!) 147/79 (!) 164/103 (!) 149/81 (!) 163/92  Pulse: 71 77 81 (!) 55  Resp: (!) 23 (!) 23 (!) 25 (!) 24  Temp:      TempSrc:      SpO2: 97% 100% 100% 93%  Weight:      Height:       Eyes: PERRL, lids and conjunctivae normal ENMT: Dry lips with mild crusting, small amount of dark appearing crusting to middle of lips, but otherwise not significant, no suggestion of blood or bleeding.     Neck: normal, supple, no masses, no thyromegaly Respiratory: clear to auscultation bilaterally, no wheezing, no crackles. Normal respiratory effort. No accessory muscle use.  Cardiovascular: Regular rate and rhythm, no murmurs / rubs / gallops. No extremity edema. 2+ pedal pulses.   Abdomen: no tenderness, no masses palpated. No hepatosplenomegaly. Bowel sounds positive.  Musculoskeletal: no clubbing  / cyanosis. No joint deformity upper and lower extremities. Good ROM, no contractures. Normal muscle tone.  Skin: no rashes, lesions, ulcers. No induration Neurologic: Following directions, no facial asymmetry, able to vocalize but speech confused, 4+/5 strength in all extremities. Psychiatric: Confused.  But following most directions.  Unable to tell me his name or his doctor's name, or why he is in the hospital.  Labs on Admission: I have personally reviewed following labs and imaging studies  CBC: Recent Labs  Lab 11/10/20 1920  WBC 14.8*  NEUTROABS 13.0*  HGB 14.1  HCT 41.8  MCV 97.9  PLT [id number]   Basic Metabolic Panel: Recent Labs  Lab 11/10/20 1920  NA 136  K 3.9  CL 104  CO2 24  GLUCOSE 114*  BUN 26*  CREATININE 1.28*  CALCIUM 8.7*   Liver Function Tests: Recent Labs  Lab 11/10/20 1920  AST 44*  ALT 22  ALKPHOS 62  BILITOT 1.3*  PROT 7.1  ALBUMIN 3.7   Cardiac Enzymes: Recent Labs  Lab 11/10/20 1920  CKTOTAL 999*   Urine analysis:    Component Value Date/Time   COLORURINE YELLOW 11/10/2020 1905   APPEARANCEUR CLEAR 11/10/2020 1905   LABSPEC 1.020 11/10/2020 1905   PHURINE 6.0 11/10/2020 1905   GLUCOSEU NEGATIVE 11/10/2020 [place] (A) 11/10/2020 [place] 11/10/2020 1905   KETONESUR 20 (A) 11/10/2020 1905   PROTEINUR 100 (A) 11/10/2020 1905   NITRITE NEGATIVE 11/10/2020 1905   LEUKOCYTESUR NEGATIVE 11/10/2020 1905    Radiological Exams on Admission: CT Head Wo Contrast  Result Date: 11/10/2020 CLINICAL DATA:  Altered mental status EXAM: CT HEAD WITHOUT CONTRAST TECHNIQUE: Contiguous axial images were obtained from the base of the skull through the vertex without intravenous contrast. COMPARISON:  None. FINDINGS: Brain: Diffuse low-density throughout the deep white matter, likely chronic small vessel disease. No acute intracranial abnormality. Specifically, no hemorrhage, hydrocephalus, mass lesion, acute infarction,  or significant intracranial injury. Vascular: No hyperdense vessel or unexpected calcification. Skull: No acute calvarial abnormality. Sinuses/Orbits: Air-fluid levels in the left sphenoid sinus and bilateral maxillary sinuses. Other: None IMPRESSION: Chronic small vessel disease. No acute intracranial abnormality. Acute sinusitis. Electronically Signed   By: Rolm Baptise M.D.   On: 11/10/2020 20:12   CT Cervical Spine Wo Contrast  Result Date: 11/10/2020 CLINICAL DATA:  Unwitnessed fall EXAM: CT CERVICAL SPINE WITHOUT CONTRAST TECHNIQUE: Multidetector CT imaging of the cervical spine was performed without intravenous contrast. Multiplanar CT image reconstructions were also generated. COMPARISON:  None. FINDINGS: Alignment: No subluxation Skull base and vertebrae: No fracture. There are multiple sclerotic lesions throughout the cervical spine. Soft tissues and spinal canal: No prevertebral fluid or swelling. No visible canal hematoma. Disc levels:  Maintained.  Degenerative facet disease. Upper chest: Sclerotic lesions in the upper ribs. Other: None IMPRESSION: No evidence of fracture. Numerous  sclerotic lesions throughout the cervical spine and upper ribs concerning for sclerotic metastases. Electronically Signed   By: Rolm Baptise M.D.   On: 11/10/2020 20:14   DG Chest Port 1 View  Result Date: 11/10/2020 CLINICAL DATA:  Altered mental status.  Unwitnessed fall. EXAM: PORTABLE CHEST 1 VIEW COMPARISON:  None. FINDINGS: Shallow inspiration. Heart size and pulmonary vascularity are normal for inspiratory effort. There is a mild interstitial pattern seen to the lungs with some peribronchial thickening. This is likely chronic bronchitis although early multifocal pneumonia could have this appearance in the appropriate clinical setting. No focal consolidation. No pleural effusions. No pneumothorax. Mediastinal contours appear intact. Calcification of the aorta. Visualized bones are nondepressed. IMPRESSION:  Chronic bronchitic changes in the lungs. No active pulmonary disease. Electronically Signed   By: Lucienne Capers M.D.   On: 11/10/2020 19:21    EKG: Independently reviewed.  Sinus rhythm, first-degree heart block PR interval prolonged 236.  QTc 489.  Nonspecific T wave abnormalities lead I and aVL.  Assessment/Plan Principal Problem:   AMS (altered mental status) Active Problems:   Prostate cancer metastatic to bone (HCC)   AF (paroxysmal atrial fibrillation) (HCC)   Metabolic encephalopathy-able to vocalize, but speech is confused/abnormal.  Head CT unremarkable.  UA chest x-ray not suggestive of infection.  Leukocytosis of 14.8, no source of infection identified.  Afebrile.  4+/5 strength in all extremities, no other focal neurologic deficits.  Creatinine 1.2 today, improved compared to prior per Care Everywhere-baseline 1.6-1.7. -Hydrate for now, -Pending clinical course, if no improvement, consider MRI Brain -Called nursing home no response.  Rhabdomyolysis-CK  999.  Found down, unknown how long patient was on the floor. -  1L  bolus given, continue N/s 75cc/hr x 20hrs - CK a.m  Metastatic prostate cancer to bone-diagnosed 2019, follows with hematologist Dr. Archie Balboa at [facility].  Per notes 08/2020, undetectable PSA: He is on regular Lupron injections and on denosumab.  Paroxysmal atrial fibrillation-currently sinus rhythm.  Not on anticoagulation due to recurrent GI bleeds with multiple AVM and increased age. -Awaiting med reconciliation, no response from nursing home  GI bleed history-hemoglobin stable.  Coronary artery disease, first-degree AV block not new.  History of stent placement 2019.  Per cardiology note history of reduced ejection fraction, also mentioned that EF improved.  I am unable to view echo results. -Pending med reconciliation, hold Lasix, carvedilol.  DVT prophylaxis: Lovenox Code Status: Full code-confirmed with daughter Caleb Nunez at  bedside Family Communication: Spoke to patient's daughter Caleb Nunez at bedside, she is patient's healthcare power of attorney.  No response on calling nursing home. Disposition Plan:  ~ 1 -2 days Consults called: None Admission status: Obs tele   Bethena Roys MD [facility]  11/10/2020, 10:50 PM

## 2020-11-10 NOTE — ED Notes (Signed)
Dr. Karle Starch given EKG and updated on patient at this time.

## 2020-11-10 NOTE — ED Notes (Signed)
Patient's daughter at bedside and updated on plan of care. Daughter educated on Development worker, community. Attempted to call [facility] without success; unable to reach anyone.

## 2020-11-10 NOTE — ED Provider Notes (Signed)
[facility] EMERGENCY DEPARTMENT Provider Note  CSN: [id number] Arrival date & time: 11/10/20 1820    History Chief Complaint  Patient presents with   Altered Mental Status    Ramesh Sthilaire is a 87 y.o. male with history of metastatic prostate cancer, currently in remission on long term maintenance therapy at the [facility] and living in independent living was found on the floor of his bathroom unknown how long he was there. His granddaughter at bedside states she visited him yesterday and he was in his normal state of health. He is wearing his pants and shoes, so she does not think he fell during the night.    No past medical history on file.  No family history on file.      Home Medications Prior to Admission medications   Medication Sig Start Date End Date Taking? Authorizing Provider  amiodarone (PACERONE) 100 MG tablet Oral 05/30/19  Yes [provider]  calcium citrate-vitamin D (CITRACAL+D) 315-200 MG-UNIT tablet Take by mouth. 02/01/19  Yes [provider]  ferrous sulfate 325 (65 FE) MG EC tablet Take by mouth. 01/16/19  Yes [provider]  atorvastatin (LIPITOR) 80 MG tablet Take 80 mg by mouth daily. 05/29/20   [provider]  carvedilol (COREG) 3.125 MG tablet Oral    [provider]  [employer]S VITAMIN B12 1000 MCG tablet Take 1,000 mcg by mouth daily. 10/03/20   [provider]  furosemide (LASIX) 40 MG tablet Oral    [provider]  leuprolide (LUPRON) 22.5 MG injection Inject into the muscle.    [provider]  lisinopril (ZESTRIL) 5 MG tablet Oral    [provider]     Allergies    Patient has no known allergies.   Review of Systems   Review of Systems Unable to assess due to mental status.      Physical Exam BP (!) 145/74   Pulse 83   Temp 97.8 F (36.6 C) (Oral)   Resp 20   Ht '5\' 9"'$  (1.753 m)   Wt 63.5 kg   SpO2 93%   BMI 20.67 kg/m   Physical  Exam Vitals and nursing note reviewed.  Constitutional:      Appearance: Normal appearance.  HENT:     Head: Normocephalic and atraumatic.     Nose: Nose normal.     Mouth/Throat:     Mouth: Mucous membranes are dry.     Comments: Dried blood in mouth, but no active bleeding Eyes:     Extraocular Movements: Extraocular movements intact.     Conjunctiva/sclera: Conjunctivae normal.  Cardiovascular:     Rate and Rhythm: Normal rate.  Pulmonary:     Effort: Pulmonary effort is normal.     Breath sounds: Normal breath sounds.  Abdominal:     General: Abdomen is flat.     Palpations: Abdomen is soft.     Tenderness: There is no abdominal tenderness.  Musculoskeletal:        General: No swelling. Normal range of motion.     Cervical back: Neck supple. No tenderness.  Skin:    General: Skin is warm and dry.  Neurological:     General: No focal deficit present.     Mental Status: He is disoriented.     Cranial Nerves: No cranial nerve deficit.     Motor: No weakness.  Psychiatric:        Mood and Affect: Mood normal.  ED Results / Procedures / Treatments   Labs (all labs ordered are listed, but only abnormal results are displayed) Labs Reviewed  COMPREHENSIVE METABOLIC PANEL - Abnormal; Notable for the following components:      Result Value   Glucose, Bld 114 (*)    BUN 26 (*)    Creatinine, Ser 1.28 (*)    Calcium 8.7 (*)    AST 44 (*)    Total Bilirubin 1.3 (*)    GFR, Estimated 53 (*)    All other components within normal limits  CBC WITH DIFFERENTIAL/PLATELET - Abnormal; Notable for the following components:   WBC 14.8 (*)    Neutro Abs 13.0 (*)    Lymphs Abs 0.5 (*)    Monocytes Absolute 1.3 (*)    All other components within normal limits  URINALYSIS, ROUTINE W REFLEX MICROSCOPIC - Abnormal; Notable for the following components:   Hgb urine dipstick MODERATE (*)    Ketones, ur 20 (*)    Protein, ur 100 (*)    All other components within normal limits   CK - Abnormal; Notable for the following components:   Total CK 999 (*)    All other components within normal limits  RESP PANEL BY RT-PCR (FLU A&B, COVID) [id number]    EKG EKG Interpretation  Date/Time:  Monday November 10 2020 18:31:02 EDT Ventricular Rate:  74 PR Interval:  236 QRS Duration: 110 QT Interval:  440 QTC Calculation: 489 R Axis:   -42 Text Interpretation: Sinus rhythm Prolonged PR interval Left axis deviation Anteroseptal infarct, old Nonspecific T abnormalities, lateral leads No old tracing to compare Confirmed by Calvert Cantor [phone]) on 11/10/2020 7:12:34 PM  Radiology CT Head Wo Contrast  Result Date: 11/10/2020 CLINICAL DATA:  Altered mental status EXAM: CT HEAD WITHOUT CONTRAST TECHNIQUE: Contiguous axial images were obtained from the base of the skull through the vertex without intravenous contrast. COMPARISON:  None. FINDINGS: Brain: Diffuse low-density throughout the deep white matter, likely chronic small vessel disease. No acute intracranial abnormality. Specifically, no hemorrhage, hydrocephalus, mass lesion, acute infarction, or significant intracranial injury. Vascular: No hyperdense vessel or unexpected calcification. Skull: No acute calvarial abnormality. Sinuses/Orbits: Air-fluid levels in the left sphenoid sinus and bilateral maxillary sinuses. Other: None IMPRESSION: Chronic small vessel disease. No acute intracranial abnormality. Acute sinusitis. Electronically Signed   By: Rolm Baptise M.D.   On: 11/10/2020 20:12   CT Cervical Spine Wo Contrast  Result Date: 11/10/2020 CLINICAL DATA:  Unwitnessed fall EXAM: CT CERVICAL SPINE WITHOUT CONTRAST TECHNIQUE: Multidetector CT imaging of the cervical spine was performed without intravenous contrast. Multiplanar CT image reconstructions were also generated. COMPARISON:  None. FINDINGS: Alignment: No subluxation Skull base and vertebrae: No fracture. There are multiple sclerotic lesions throughout the cervical  spine. Soft tissues and spinal canal: No prevertebral fluid or swelling. No visible canal hematoma. Disc levels:  Maintained.  Degenerative facet disease. Upper chest: Sclerotic lesions in the upper ribs. Other: None IMPRESSION: No evidence of fracture. 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 Electronically Signed   By: Rolm Baptise M.D.   On: 11/10/2020 20:14   DG Chest Port 1 View  Result Date: 11/10/2020 CLINICAL DATA:  Altered mental status.  Unwitnessed fall. EXAM: PORTABLE CHEST 1 VIEW COMPARISON:  None. FINDINGS: Shallow inspiration. Heart size and pulmonary vascularity are normal for inspiratory effort. There is a mild interstitial pattern seen to the lungs with some peribronchial thickening. This is likely chronic bronchitis  although early multifocal pneumonia could have this appearance in the appropriate clinical setting. No focal consolidation. No pleural effusions. No pneumothorax. Mediastinal contours appear intact. Calcification of the aorta. Visualized bones are nondepressed. IMPRESSION: Chronic bronchitic changes in the lungs. No active pulmonary disease. Electronically Signed   By: Lucienne Capers M.D.   On: 11/10/2020 19:21    Procedures Procedures  Medications Ordered in the ED Medications  sodium chloride 0.9 % bolus 1,000 mL (0 mLs Intravenous Stopped 11/10/20 2030)     MDM Rules/Calculators/A&P MDM Patient with confusion and dry membranes after unwitnessed fall. Unsure how long he was on the floor. Will check labs, CT head and c-spine. IVF started.   ED Course  I have reviewed the triage vital signs and the nursing notes.  Pertinent labs & imaging results that were available during my care of the patient were reviewed by me and considered in my medical decision making (see chart for details).  Clinical Course as of 11/10/20 2316  Mon Nov 10, 2020  1947 CBC with mild leukocytosis, otherwise normal.   [CS]  2012 CMP shows CKD improved from prior baseline, CK is mildly elevated.  [CS]  2040 CT head neg for injury. CT c-spine shows metastatic disease, has known bony mets from prostate cancer.  [CS]  2051 UA neg for infection.  [CS]  2148 Covid/Flu are neg. Patient still very confused. Unclear etiology. Will admit for further eval. Granddaughter amenable.  [CS]    Clinical Course User Index [CS] Truddie Hidden, MD    Final Clinical Impression(s) / ED Diagnoses Final diagnoses:  Altered mental status, unspecified altered mental status type  Fall, initial encounter    Rx / DC Orders ED Discharge Orders     None        Truddie Hidden, MD 11/10/20 2316

## 2020-11-10 NOTE — ED Triage Notes (Signed)
Patient coming from assisted living in [place] due to found in the bathroom for possible unwitnessed fall. Staff did not know how Caleb Nunez the patient was in the bathroom before finding him but he did not eat this morning. Patient has dry mouth with possible blood in mouth. and is alert but confused at this time. Patient is normally ambulatory and independent. Patient pupils reactive to light with CBG 132.

## 2020-11-10 NOTE — ED Notes (Signed)
Patient coming from [facility] in [place]

## 2020-11-11 ENCOUNTER — Encounter (HOSPITAL_COMMUNITY): Payer: Self-pay | Admitting: Family Medicine

## 2020-11-11 ENCOUNTER — Observation Stay (HOSPITAL_COMMUNITY): Payer: Medicare Other

## 2020-11-11 DIAGNOSIS — Z7189 Other specified counseling: Secondary | ICD-10-CM

## 2020-11-11 DIAGNOSIS — E785 Hyperlipidemia, unspecified: Secondary | ICD-10-CM | POA: Diagnosis present

## 2020-11-11 DIAGNOSIS — I639 Cerebral infarction, unspecified: Secondary | ICD-10-CM | POA: Diagnosis present

## 2020-11-11 DIAGNOSIS — T796XXD Traumatic ischemia of muscle, subsequent encounter: Secondary | ICD-10-CM

## 2020-11-11 DIAGNOSIS — W19XXXA Unspecified fall, initial encounter: Secondary | ICD-10-CM | POA: Diagnosis present

## 2020-11-11 DIAGNOSIS — Z20822 Contact with and (suspected) exposure to covid-19: Secondary | ICD-10-CM | POA: Diagnosis present

## 2020-11-11 DIAGNOSIS — C7951 Secondary malignant neoplasm of bone: Secondary | ICD-10-CM | POA: Diagnosis present

## 2020-11-11 DIAGNOSIS — I2510 Atherosclerotic heart disease of native coronary artery without angina pectoris: Secondary | ICD-10-CM | POA: Diagnosis present

## 2020-11-11 DIAGNOSIS — R4182 Altered mental status, unspecified: Secondary | ICD-10-CM | POA: Diagnosis present

## 2020-11-11 DIAGNOSIS — Z515 Encounter for palliative care: Secondary | ICD-10-CM

## 2020-11-11 DIAGNOSIS — R29703 NIHSS score 3: Secondary | ICD-10-CM | POA: Diagnosis present

## 2020-11-11 DIAGNOSIS — I6389 Other cerebral infarction: Secondary | ICD-10-CM | POA: Diagnosis not present

## 2020-11-11 DIAGNOSIS — R54 Age-related physical debility: Secondary | ICD-10-CM | POA: Diagnosis present

## 2020-11-11 DIAGNOSIS — R4701 Aphasia: Secondary | ICD-10-CM | POA: Diagnosis present

## 2020-11-11 DIAGNOSIS — C61 Malignant neoplasm of prostate: Secondary | ICD-10-CM | POA: Diagnosis present

## 2020-11-11 DIAGNOSIS — G9341 Metabolic encephalopathy: Secondary | ICD-10-CM | POA: Diagnosis present

## 2020-11-11 DIAGNOSIS — I440 Atrioventricular block, first degree: Secondary | ICD-10-CM | POA: Diagnosis present

## 2020-11-11 DIAGNOSIS — M6282 Rhabdomyolysis: Secondary | ICD-10-CM | POA: Diagnosis present

## 2020-11-11 DIAGNOSIS — I671 Cerebral aneurysm, nonruptured: Secondary | ICD-10-CM | POA: Diagnosis present

## 2020-11-11 DIAGNOSIS — Z8546 Personal history of malignant neoplasm of prostate: Secondary | ICD-10-CM | POA: Diagnosis not present

## 2020-11-11 DIAGNOSIS — I480 Paroxysmal atrial fibrillation: Secondary | ICD-10-CM | POA: Diagnosis present

## 2020-11-11 DIAGNOSIS — I10 Essential (primary) hypertension: Secondary | ICD-10-CM | POA: Diagnosis present

## 2020-11-11 LAB — BASIC METABOLIC PANEL
Anion gap: 7 (ref 5–15)
BUN: 25 mg/dL — ABNORMAL HIGH (ref 8–23)
CO2: 22 mmol/L (ref 22–32)
Calcium: 7.9 mg/dL — ABNORMAL LOW (ref 8.9–10.3)
Chloride: 107 mmol/L (ref 98–111)
Creatinine, Ser: 1.16 mg/dL (ref 0.61–1.24)
GFR, Estimated: 59 mL/min — ABNORMAL LOW (ref >60)
Glucose, Bld: 90 mg/dL (ref 70–99)
Potassium: 3.9 mmol/L (ref 3.5–5.1)
Sodium: 136 mmol/L (ref 135–145)

## 2020-11-11 LAB — CBC
HCT: 37.8 % — ABNORMAL LOW (ref 39.0–52.0)
Hemoglobin: 12.7 g/dL — ABNORMAL LOW (ref 13.0–17.0)
MCH: 32.8 pg (ref 26.0–34.0)
MCHC: 33.6 g/dL (ref 30.0–36.0)
MCV: 97.7 fL (ref 80.0–100.0)
Platelets: 250 10*3/uL (ref 150–400)
RBC: 3.87 MIL/uL — ABNORMAL LOW (ref 4.22–5.81)
RDW: 13.4 % (ref 11.5–15.5)
WBC: 10.9 10*3/uL — ABNORMAL HIGH (ref 4.0–10.5)
nRBC: 0 % (ref 0.0–0.2)

## 2020-11-11 LAB — LIPID PANEL
Cholesterol: 178 mg/dL (ref 0–200)
HDL: 59 mg/dL (ref >40)
LDL Cholesterol: 106 mg/dL — ABNORMAL HIGH (ref 0–99)
Total CHOL/HDL Ratio: 3 RATIO
Triglycerides: 67 mg/dL (ref <150)
VLDL: 13 mg/dL (ref 0–40)

## 2020-11-11 LAB — LACTIC ACID, PLASMA: Lactic Acid, Venous: 1.2 mmol/L (ref 0.5–1.9)

## 2020-11-11 LAB — PROCALCITONIN: Procalcitonin: <0.1 ng/mL

## 2020-11-11 LAB — TROPONIN I (HIGH SENSITIVITY)
Troponin I (High Sensitivity): 21 ng/L — ABNORMAL HIGH (ref <18)
Troponin I (High Sensitivity): 21 ng/L — ABNORMAL HIGH (ref <18)

## 2020-11-11 LAB — CK: Total CK: 995 U/L — ABNORMAL HIGH (ref 49–397)

## 2020-11-11 IMAGING — MR MR MRA HEAD W/O CM
1 series · 48 of 48 positions shown · non-contrast
Comparison: CT head [DATE].

CLINICAL DATA: Delirium

EXAM:
MRI HEAD WITHOUT CONTRAST
MRA HEAD WITHOUT CONTRAST
TECHNIQUE: Multiplanar, multi-echo pulse sequences of the brain and surrounding
structures were acquired without intravenous contrast. Angiographic
images of the Circle of Willis were acquired using MRA technique
without intravenous contrast.

[Series 100: TOF · axial · 0.5mm · 0.76mm/px · z∈[-23,+51]mm · 48 of 168 slices shown]
[im 1/168]
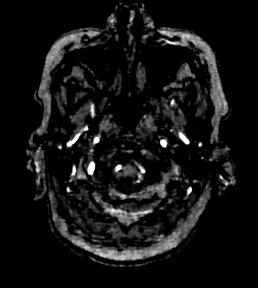
[im 4/168]
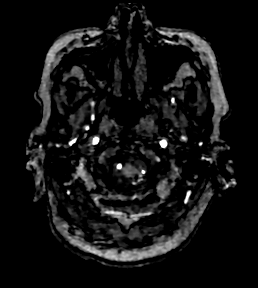
[im 8/168]
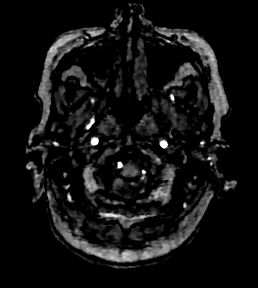
[im 11/168]
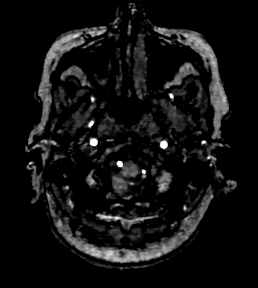
[im 15/168]
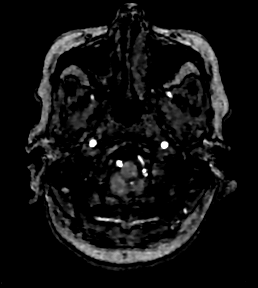
[im 18/168]
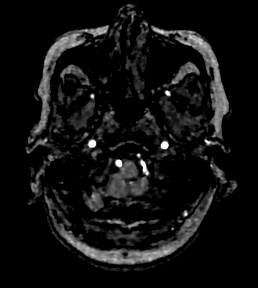
[im 22/168]
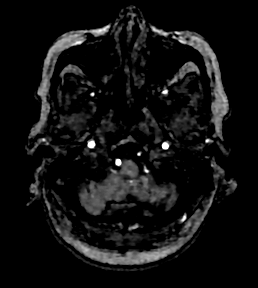
[im 25/168]
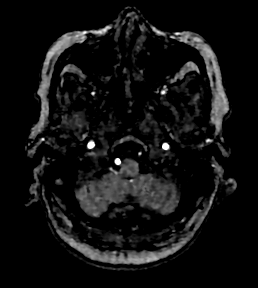
[im 29/168]
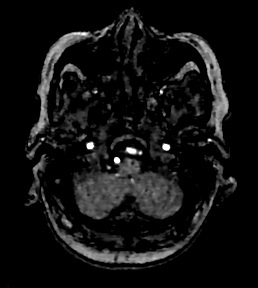
[im 32/168]
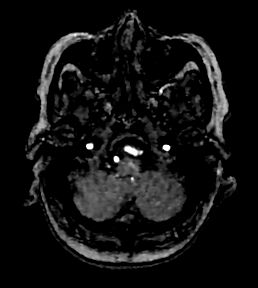
[im 36/168]
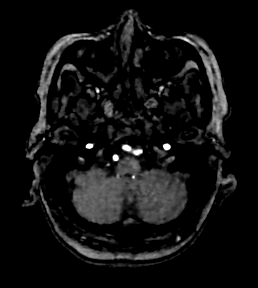
[im 40/168]
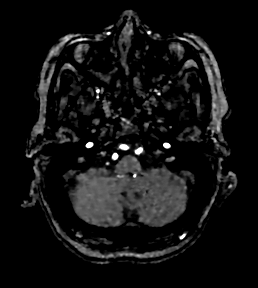
[im 43/168]
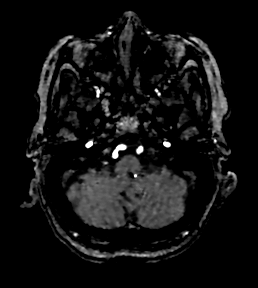
[im 47/168]
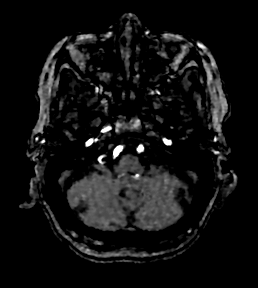
[im 50/168]
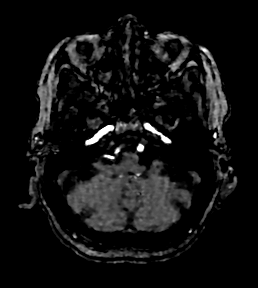
[im 54/168]
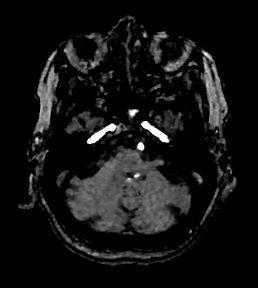
[im 57/168]
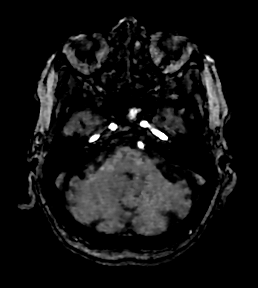
[im 61/168]
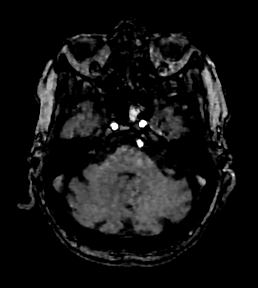
[im 64/168]
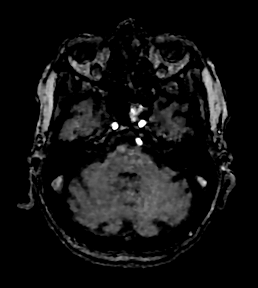
[im 68/168]
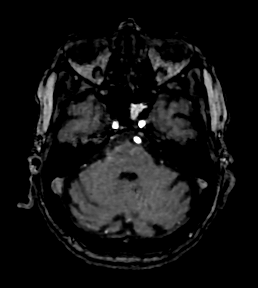
[im 72/168]
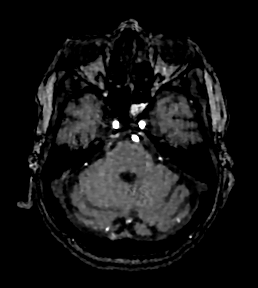
[im 75/168]
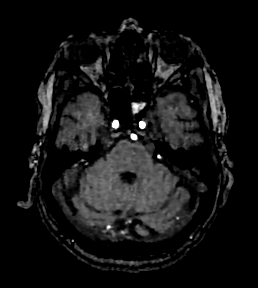
[im 79/168]
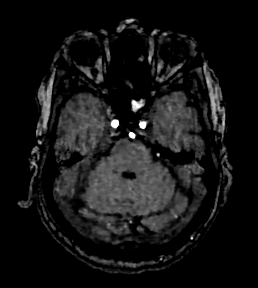
[im 82/168]
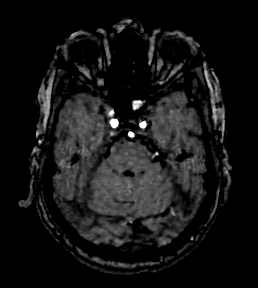
[im 86/168]
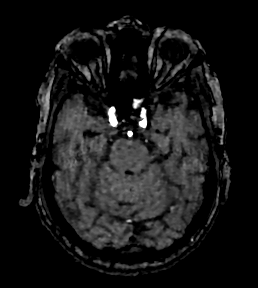
[im 89/168]
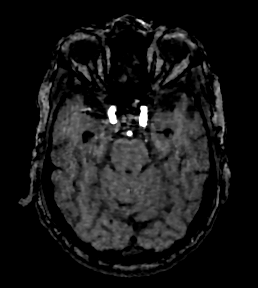
[im 93/168]
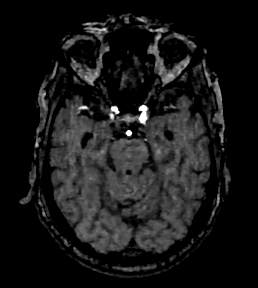
[im 96/168]
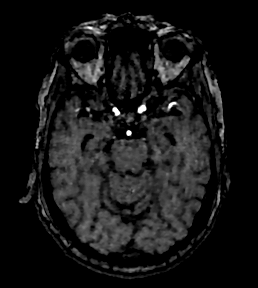
[im 100/168]
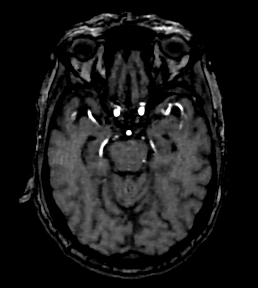
[im 104/168]
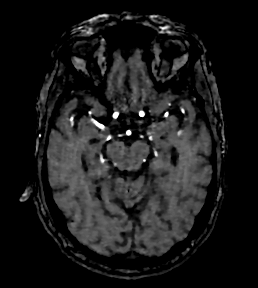
[im 107/168]
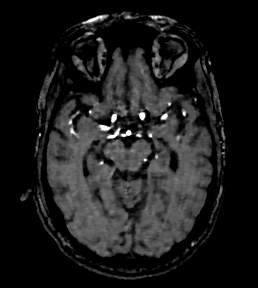
[im 111/168]
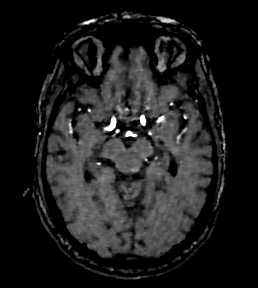
[im 114/168]
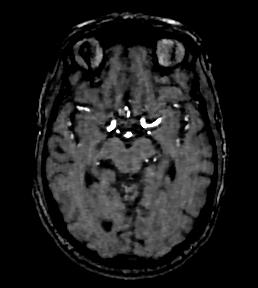
[im 118/168]
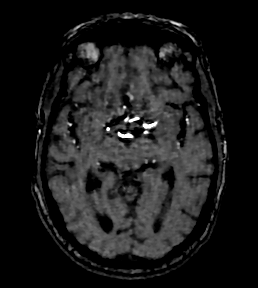
[im 121/168]
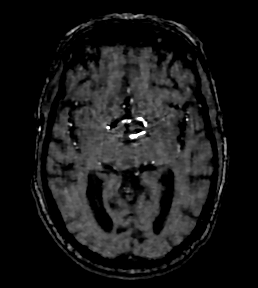
[im 125/168]
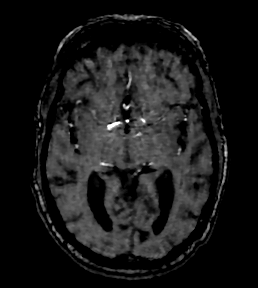
[im 128/168]
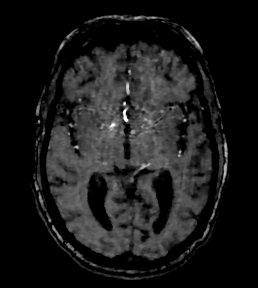
[im 132/168]
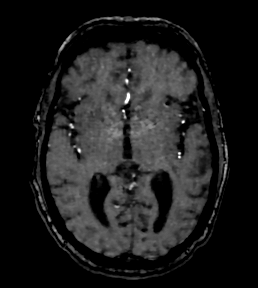
[im 136/168]
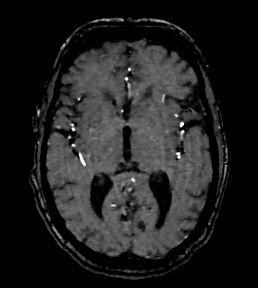
[im 139/168]
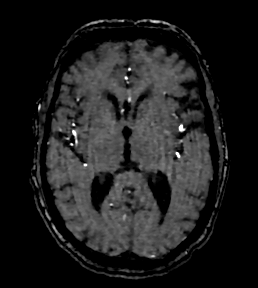
[im 143/168]
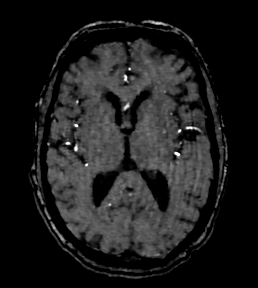
[im 146/168]
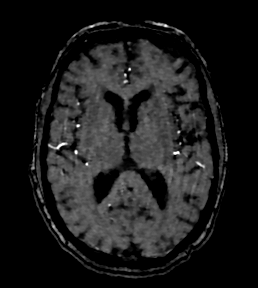
[im 150/168]
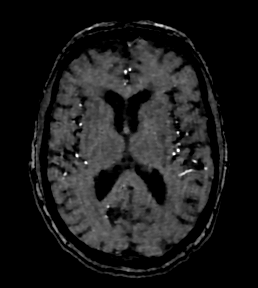
[im 153/168]
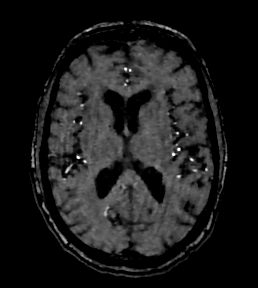
[im 157/168]
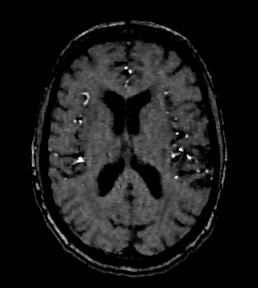
[im 160/168]
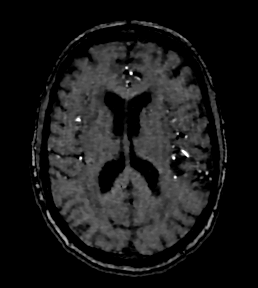
[im 164/168]
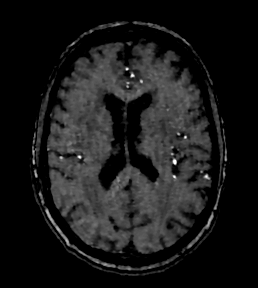
[im 168/168]
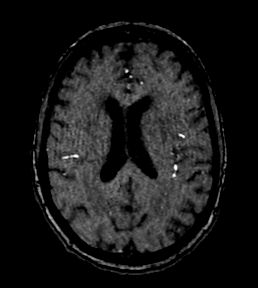

[48 of 48 positions shown; findings below may reference images not displayed]

FINDINGS: MRI HEAD FINDINGS

Brain: Multiple cortical and subcortical acute infarcts in the left
frontal lobe and anterior left insula, confluent in areas. Multiple
smaller acute infarcts in the left parietal cortex, and right
frontoparietal white matter (annotated on series 5). Mild associated
edema without mass effect. No midline shift. Additional moderate
patchy T2 hyperintensities within the white matter, compatible with
chronic microvascular ischemic disease. Mild atrophy. No evidence of
acute hemorrhage, hydrocephalus, mass lesion, extra-axial fluid
collection, or hydrocephalus.

Vascular: See below.

Skull and upper cervical spine: Multiple sclerotic lesions in the
partially imaged cervical spine, concerning metastases.

Sinuses/Orbits: Moderate mucosal thickening of the ethmoid air
cells, maxillary sinuses, and left sphenoid sinus with air-fluid
levels in the maxillary and sphenoid sinuses.

Other: Trace bilateral mastoid effusions.

MRA HEAD FINDINGS

Anterior circulation: Bilateral intracranial ICAs and M1 MCAs are
patent without hemodynamically significant stenosis. Severe stenosis
of a proximal left M2 MCA branch. Multifocal mild-to-moderate
stenosis of multiple additional MCA branches bilaterally. Poor
visualization of the ACAs with suspected severe left and moderate
right A2 ACA stenosis. Right opthalmic artery infundibulum.
Approximately 2-3 mm outpouching arising from the left paraclinoid
ICA anteriorly.

Posterior circulation: Small left intradural vertebral artery and
dominant right intradural vertebral artery. Basilar artery bilateral
posterior cerebral arteries are without proximal hemodynamically
significant stenosis. Multifocal mild-to-moderate narrowing of the
PCAs with for evaluation distally. Approximately 2 mm anteriorly
directed outpouching arising from the proximal right PCA (series
100, image 108), concerning for aneurysm.
IMPRESSION: MRI:

1. Acute cortical infarcts in the left frontal lobe and insula with
additional smaller acute infarcts in the left parietal and right
frontoparietal lobes. Mild associated edema without mass effect.
Given involvement of multiple vascular territories, consider an
embolic etiology.
2. Moderate chronic microvascular ischemic disease.
3. Multiple sclerotic lesions in the partially imaged cervical
spine, concerning for metastases. Postcontrast imaging of head could
provide more sensitive evaluation for intracranial metastatic
disease if clinically indicated.
4. Moderate paranasal sinus disease with air-fluid levels, detailed
above.

MRA:

1. Severe stenosis of a proximal left M2 MCA branch in the region of
left frontal infarcts described above. Multifocal mild-to-moderate
stenosis of multiple additional MCA branches bilaterally with poor
evaluation distally.
2. Poor visualization of the ACAs with suspected severe left and
moderate right A2 ACA stenosis.
3. Approximately 2 mm anteriorly directed outpouching arising from
the proximal right PCA, concerning for aneurysm.
4. Approximately 2-3 mm outpouching arising from the left
paraclinoid ICA anteriorly, suggestive of aneurysm versus opthalmic
artery infundibulum with vessel too small to see by MRA. Additional
outpouching arising from the right paraclinoid ICA is favored to be
an infunidbulum.
5. A CTA could better characterize the above findings if clinically
indicated.

These results will be called to the ordering clinician or
representative by the Radiologist Assistant, and communication
documented in the PACS or [REDACTED].

## 2020-11-11 IMAGING — MR MR HEAD W/O CM
9 of 10 series · 39 of 48 positions shown · non-contrast
Comparison: CT head [DATE].

CLINICAL DATA: Delirium

EXAM:
MRI HEAD WITHOUT CONTRAST
MRA HEAD WITHOUT CONTRAST
TECHNIQUE: Multiplanar, multi-echo pulse sequences of the brain and surrounding
structures were acquired without intravenous contrast. Angiographic
images of the Circle of Willis were acquired using MRA technique
without intravenous contrast.

[Series 5: DWI · axial · 4.0mm · 0.88mm/px · z∈[-23,+109]mm · 5 of 36 slices shown (1 of 4)]
[im 1/36]
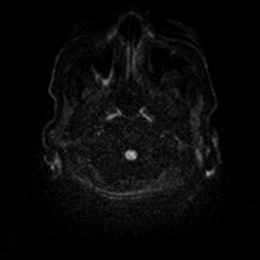
[im 9/36]
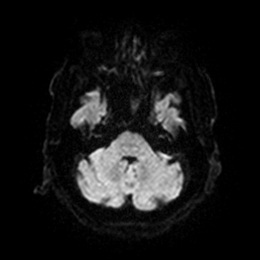
[im 18/36]
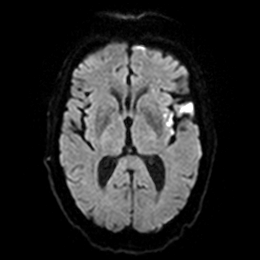
[im 27/36]
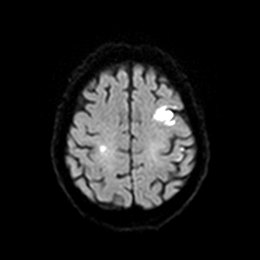
[im 36/36]
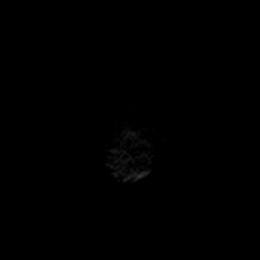

[Series 6: DWI · axial · 4.0mm · 0.88mm/px · z∈[-23,+109]mm · 5 of 36 slices shown (2 of 4)]
[im 1/36]
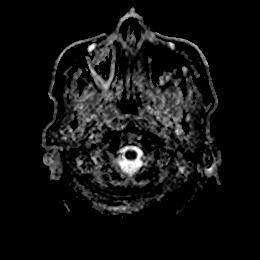
[im 9/36]
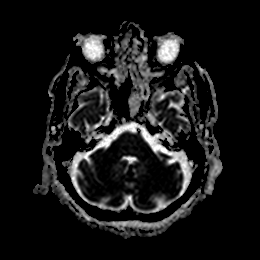
[im 18/36]
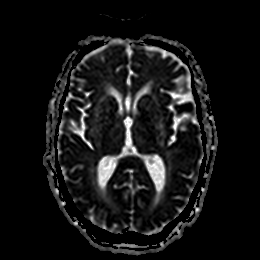
[im 27/36]
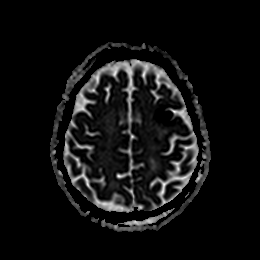
[im 36/36]
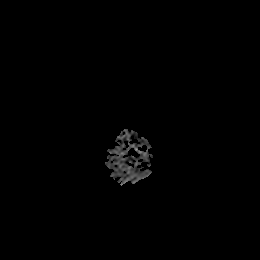

[Series 7: DWI · coronal · 4.0mm · 0.88mm/px · 5 of 32 slices shown (3 of 4)]
[im 1/32]
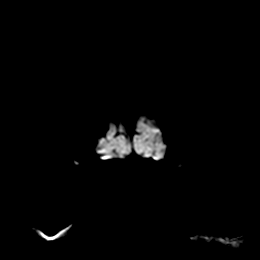
[im 8/32]
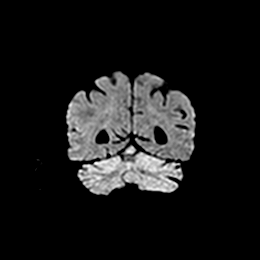
[im 16/32]
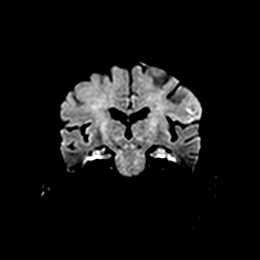
[im 24/32]
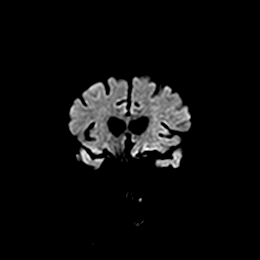
[im 32/32]
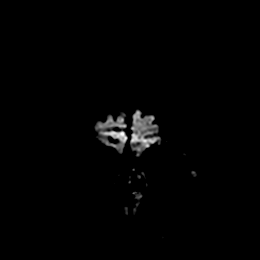

[Series 8: DWI · coronal · 4.0mm · 0.88mm/px · 5 of 32 slices shown (4 of 4)]
[im 1/32]
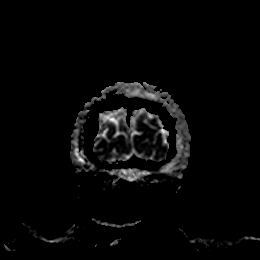
[im 8/32]
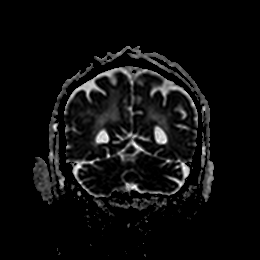
[im 16/32]
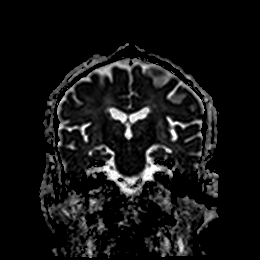
[im 24/32]
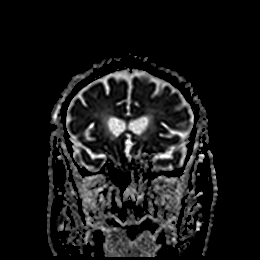
[im 32/32]
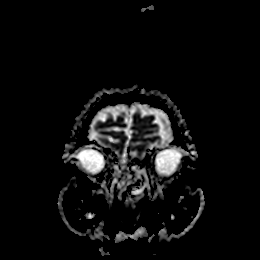

[Series 14: T1 · sagittal · 5.0mm · 0.80mm/px · 3 of 23 slices shown]
[im 1/23]
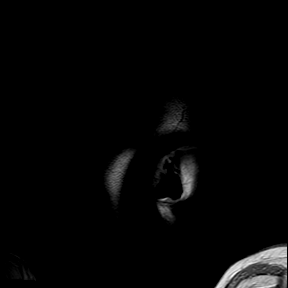
[im 12/23]
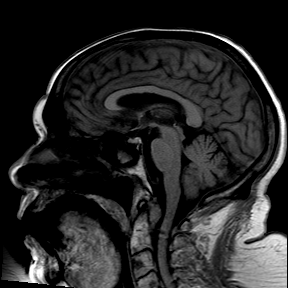
[im 23/23]
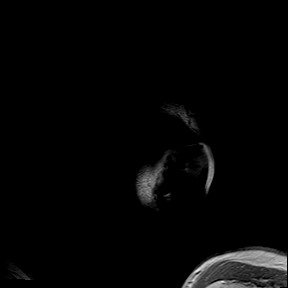

[Series 15: T2 · axial · 5.0mm · 0.72mm/px · z∈[-29,+116]mm · 3 of 23 slices shown (1 of 2)]
[im 1/23]
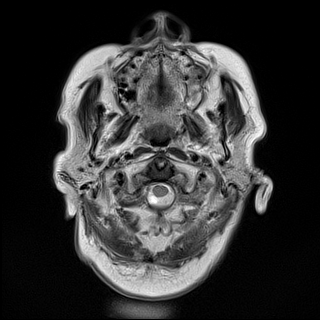
[im 12/23]
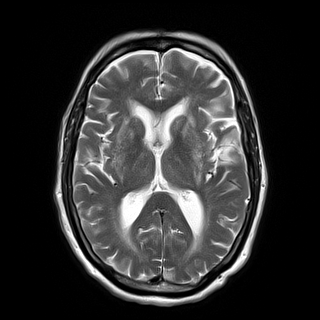
[im 23/23]
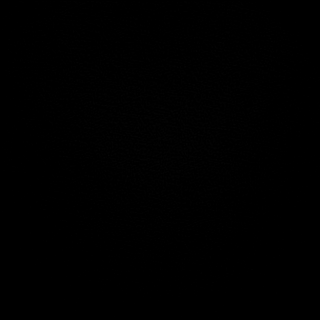

[Series 16: ax hemo · axial · 5.0mm · 0.86mm/px · z∈[-27,+109]mm · 4 of 25 slices shown]
[im 1/25]
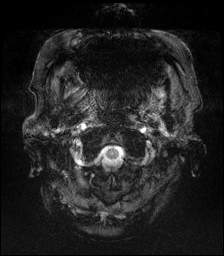
[im 9/25]
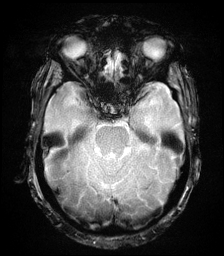
[im 17/25]
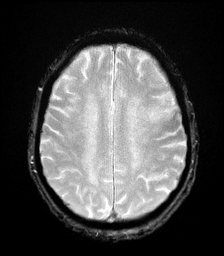
[im 25/25]
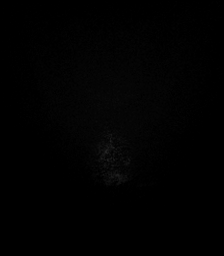

[Series 17: FLAIR · axial · 4.0mm · 0.43mm/px · z∈[-29,+111]mm · 5 of 38 slices shown]
[im 1/38]
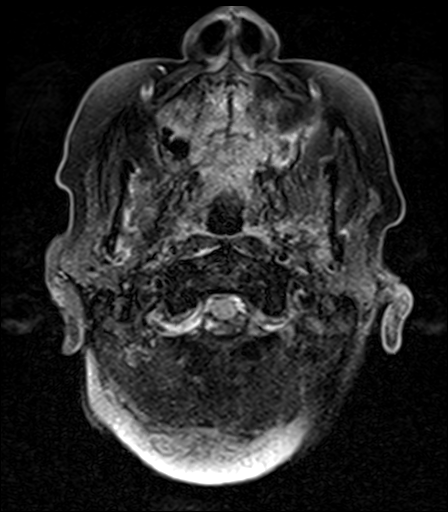
[im 10/38]
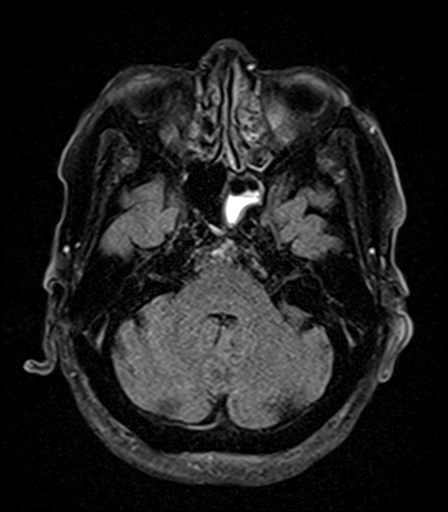
[im 19/38]
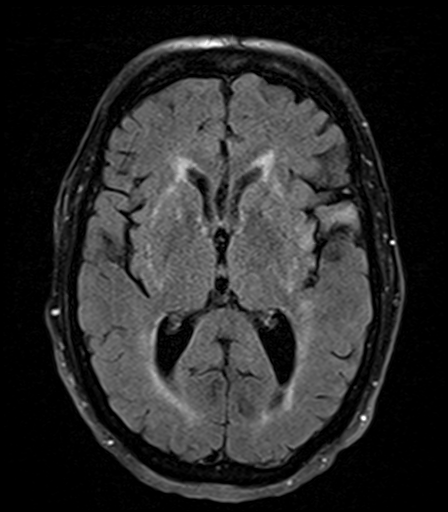
[im 28/38]
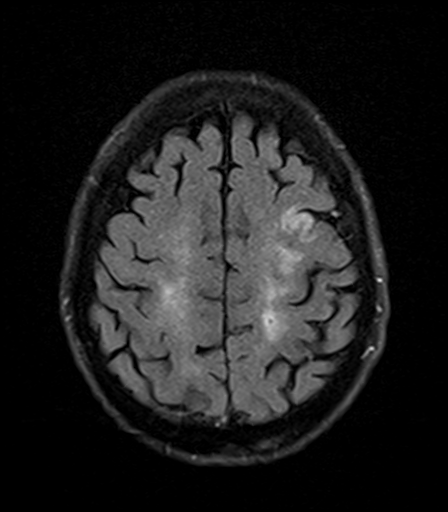
[im 38/38]
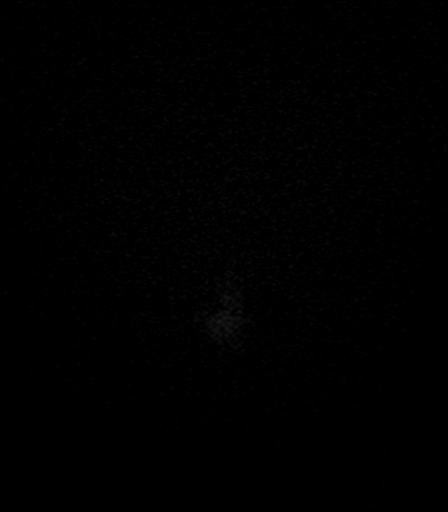

[Series 19: T2 · coronal · 5.0mm · 0.72mm/px · 4 of 28 slices shown (2 of 2)]
[im 1/28]
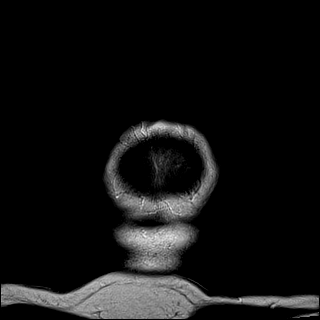
[im 10/28]
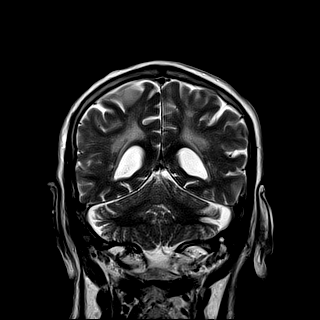
[im 19/28]
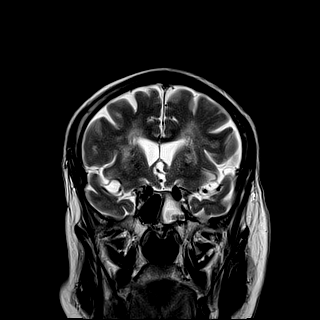
[im 28/28]
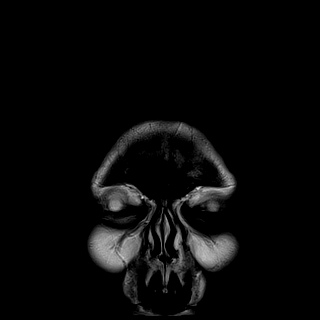

[39 of 48 positions shown; findings below may reference images not displayed]

FINDINGS: MRI HEAD FINDINGS

Brain: Multiple cortical and subcortical acute infarcts in the left
frontal lobe and anterior left insula, confluent in areas. Multiple
smaller acute infarcts in the left parietal cortex, and right
frontoparietal white matter (annotated on series 5). Mild associated
edema without mass effect. No midline shift. Additional moderate
patchy T2 hyperintensities within the white matter, compatible with
chronic microvascular ischemic disease. Mild atrophy. No evidence of
acute hemorrhage, hydrocephalus, mass lesion, extra-axial fluid
collection, or hydrocephalus.

Vascular: See below.

Skull and upper cervical spine: Multiple sclerotic lesions in the
partially imaged cervical spine, concerning metastases.

Sinuses/Orbits: Moderate mucosal thickening of the ethmoid air
cells, maxillary sinuses, and left sphenoid sinus with air-fluid
levels in the maxillary and sphenoid sinuses.

Other: Trace bilateral mastoid effusions.

MRA HEAD FINDINGS

Anterior circulation: Bilateral intracranial ICAs and M1 MCAs are
patent without hemodynamically significant stenosis. Severe stenosis
of a proximal left M2 MCA branch. Multifocal mild-to-moderate
stenosis of multiple additional MCA branches bilaterally. Poor
visualization of the ACAs with suspected severe left and moderate
right A2 ACA stenosis. Right opthalmic artery infundibulum.
Approximately 2-3 mm outpouching arising from the left paraclinoid
ICA anteriorly.

Posterior circulation: Small left intradural vertebral artery and
dominant right intradural vertebral artery. Basilar artery bilateral
posterior cerebral arteries are without proximal hemodynamically
significant stenosis. Multifocal mild-to-moderate narrowing of the
PCAs with for evaluation distally. Approximately 2 mm anteriorly
directed outpouching arising from the proximal right PCA (series
100, image 108), concerning for aneurysm.
IMPRESSION: MRI:

1. Acute cortical infarcts in the left frontal lobe and insula with
additional smaller acute infarcts in the left parietal and right
frontoparietal lobes. Mild associated edema without mass effect.
Given involvement of multiple vascular territories, consider an
embolic etiology.
2. Moderate chronic microvascular ischemic disease.
3. Multiple sclerotic lesions in the partially imaged cervical
spine, concerning for metastases. Postcontrast imaging of head could
provide more sensitive evaluation for intracranial metastatic
disease if clinically indicated.
4. Moderate paranasal sinus disease with air-fluid levels, detailed
above.

MRA:

1. Severe stenosis of a proximal left M2 MCA branch in the region of
left frontal infarcts described above. Multifocal mild-to-moderate
stenosis of multiple additional MCA branches bilaterally with poor
evaluation distally.
2. Poor visualization of the ACAs with suspected severe left and
moderate right A2 ACA stenosis.
3. Approximately 2 mm anteriorly directed outpouching arising from
the proximal right PCA, concerning for aneurysm.
4. Approximately 2-3 mm outpouching arising from the left
paraclinoid ICA anteriorly, suggestive of aneurysm versus opthalmic
artery infundibulum with vessel too small to see by MRA. Additional
outpouching arising from the right paraclinoid ICA is favored to be
an infunidbulum.
5. A CTA could better characterize the above findings if clinically
indicated.

These results will be called to the ordering clinician or
representative by the Radiologist Assistant, and communication
documented in the PACS or [REDACTED].

## 2020-11-11 MED ORDER — LACTATED RINGERS IV SOLN: INTRAVENOUS | Status: DC

## 2020-11-11 MED ORDER — HYDRALAZINE HCL 20 MG/ML IJ SOLN: 10.0000 mg | INTRAMUSCULAR | Status: DC | PRN

## 2020-11-11 MED ORDER — ASPIRIN EC 81 MG PO TBEC
81.0000 mg | DELAYED_RELEASE_TABLET | Freq: Every day | ORAL | Status: DC
  Administered 2020-11-11 – 2020-11-13 (×3): 81 mg via ORAL
  Filled 2020-11-11 (×3): qty 1

## 2020-11-11 MED ORDER — ATORVASTATIN CALCIUM 40 MG PO TABS
80.0000 mg | ORAL_TABLET | Freq: Every day | ORAL | Status: DC
  Administered 2020-11-11 – 2020-11-13 (×3): 80 mg via ORAL
  Filled 2020-11-11 (×3): qty 2

## 2020-11-11 NOTE — Consult Note (Signed)
Shawano A. Merlene Laughter, MD     [email or web address]          Caleb Nunez is an 87 y.o. male.   ASSESSMENT/PLAN: ACUTE MENTAL STATUS CHANGES AND THE FALLS DUE TO ACUTE ISCHEMIC STROKE: Patient has mostly left hemispheric events and a single event on the right side. Because is most likely from severe bilateral intracranial occlusive disease although cardioembolic phenomena cannot be ruled out. Dual antiplatelet agents are recommended for 3 months. News of statin medication is recommended. 30 day cardiac event monitor is also recommended to evaluate for possible atrial fibrillation. Small intracranial aneurysm: Given the patient's age and the size of the aneurysms, no further workup is warranted at this time.     The patient presents with confusion. He was fine on the floor confused and not be able to get up. He apparently typically ambulates with assistive devices. Has not been able to talk remains confused. Workup has been significant for elevated CPK elevated WBC. Imaging also shows left hemispheric infarcts. The patient cannot provide a history.     GENERAL: He lays in bed with eyes closed. He does cooperate with evaluation.  HEENT:  Normal  ABDOMEN: soft  EXTREMITIES: No edema   BACK: normal  SKIN: Normal by inspection.    MENTAL STATUS:  Eyes are closed. He opens his eyes to light sternal rub. He does follow commands consistently but is mute.  CRANIAL NERVES: Pupils are equal, round and reactive to light and accomodation; extra ocular movements are full, there is no significant nystagmus; visual fields are full; upper and lower facial muscles are normal in strength and symmetric, there is no flattening of the nasolabial folds; tongue is midline; uvula is midline; shoulder elevation is normal.  MOTOR:  He has proximal weakness of the upper extremities bilaterally / deltoids graded at 3/5. Distal strength is about 4.  Strength in the legs are 5/5 proximally and  distally. There is mild drift of the left lower extremity otherwise other extremity shows no drift.  COORDINATION: Left finger to nose is normal, right finger to nose is normal, No rest tremor; no intention tremor; no postural tremor; no bradykinesia.  REFLEXES: Deep tendon reflexes are symmetrical and normal. Plantar reflexes are flexor on the left but extensor on the right.   SENSATION: Normal to pain.     Blood pressure (!) 175/75, pulse 66, temperature (!) 97.5 F (36.4 C), temperature source Oral, resp. rate 20, height '5\' 9"'$  (1.753 m), weight 63.5 kg, SpO2 100 %.  History reviewed. No pertinent past medical history.  History reviewed. No pertinent surgical history.  History reviewed. No pertinent family history.  Social History:  reports that he has never smoked. He has never used smokeless tobacco. He reports that he does not currently use alcohol. He reports that he does not currently use drugs.  Allergies: No Known Allergies  Medications: Prior to Admission medications   Medication Sig Start Date End Date Taking? Authorizing Provider  amiodarone (PACERONE) 100 MG tablet Take 100 mg by mouth daily. 05/30/19  Yes [provider]  atorvastatin (LIPITOR) 80 MG tablet Take 80 mg by mouth daily. 05/29/20  Yes [provider]  calcium citrate-vitamin D (CITRACAL+D) 315-200 MG-UNIT tablet Take by mouth. 02/01/19  Yes [provider]  [facility] VITAMIN B12 1000 MCG tablet Take 1,000 mcg by mouth daily. 10/03/20  Yes [provider]  ferrous sulfate 325 (65 FE) MG EC tablet Take by mouth. 01/16/19  Yes [provider]  leuprolide (LUPRON) 22.5 MG injection Inject into the muscle.   Yes [provider]  carvedilol (COREG) 3.125 MG tablet Oral Patient not taking: No sig reported    [provider]  furosemide (LASIX) 40 MG tablet Oral Patient not taking: No sig reported    [provider]  lisinopril (ZESTRIL) 5 MG tablet  Oral Patient not taking: No sig reported    [provider]    Scheduled Meds:  aspirin EC  81 mg Oral Daily   atorvastatin  80 mg Oral Daily   enoxaparin (LOVENOX) injection  40 mg Subcutaneous Q24H   Continuous Infusions:  lactated ringers 125 mL/hr at 11/11/20 1806   PRN Meds:.acetaminophen **OR** acetaminophen, ondansetron **OR** ondansetron (ZOFRAN) IV, polyethylene glycol     Results for orders placed or performed during the hospital encounter of 11/10/20 (from the past 48 hour(s))  Urinalysis, Routine w reflex microscopic     Status: Abnormal   Collection Time: 11/10/20  7:05 PM  Result Value Ref Range   Color, Urine YELLOW YELLOW   APPearance CLEAR CLEAR   Specific Gravity, Urine 1.020 1.005 - 1.030   pH 6.0 5.0 - 8.0   Glucose, UA NEGATIVE NEGATIVE mg/dL   Hgb urine dipstick MODERATE (A) NEGATIVE   Bilirubin Urine NEGATIVE NEGATIVE   Ketones, ur 20 (A) NEGATIVE mg/dL   Protein, ur 100 (A) NEGATIVE mg/dL   Nitrite NEGATIVE NEGATIVE   Leukocytes,Ua NEGATIVE NEGATIVE   RBC / HPF 0-5 0 - 5 RBC/hpf   WBC, UA 0-5 0 - 5 WBC/hpf   Bacteria, UA NONE SEEN NONE SEEN   Mucus PRESENT     Comment: Performed at [facility], [address] [place], [place] [address]  Resp Panel by RT-PCR (Flu A&B, Covid) Nasopharyngeal Swab     Status: None   Collection Time: 11/10/20  7:09 PM   Specimen: Nasopharyngeal Swab; Nasopharyngeal(NP) swabs in vial transport medium  Result Value Ref Range   SARS Coronavirus 2 by RT PCR NEGATIVE NEGATIVE    Comment: (NOTE) SARS-CoV-2 target nucleic acids are NOT DETECTED.  The SARS-CoV-2 RNA is generally detectable in upper respiratory specimens during the acute phase of infection. The lowest concentration of SARS-CoV-2 viral copies this assay can detect is 138 copies/mL. A negative result does not preclude SARS-Cov-2 infection and should not be used as the sole basis for treatment or other patient management decisions. A negative result  may occur with  improper specimen collection/handling, submission of specimen other than nasopharyngeal swab, presence of viral mutation(s) within the areas targeted by this assay, and inadequate number of viral copies(<138 copies/mL). A negative result must be combined with clinical observations, patient history, and epidemiological information. The expected result is Negative.  Fact Sheet for Patients:  EntrepreneurPulse.com.au  Fact Sheet for Healthcare Providers:  IncredibleEmployment.be  This test is no t yet approved or cleared by the [place] FDA and  has been authorized for detection and/or diagnosis of SARS-CoV-2 by FDA under an Emergency Use Authorization (EUA). This EUA will remain  in effect (meaning this test can be used) for the duration of the COVID-19 declaration under Section 564(b)(1) of the Act, 21 U.S.C.section 360bbb-3(b)(1), unless the authorization is terminated  or revoked sooner.       Influenza A by PCR NEGATIVE NEGATIVE   Influenza B by PCR NEGATIVE NEGATIVE    Comment: (NOTE) The Xpert Xpress SARS-CoV-2/FLU/RSV plus assay is intended as an aid in the diagnosis of influenza from Nasopharyngeal swab specimens  and should not be used as a sole basis for treatment. Nasal washings and aspirates are unacceptable for Xpert Xpress SARS-CoV-2/FLU/RSV testing.  Fact Sheet for Patients: EntrepreneurPulse.com.au  Fact Sheet for Healthcare Providers: IncredibleEmployment.be  This test is not yet approved or cleared by the [place] FDA and has been authorized for detection and/or diagnosis of SARS-CoV-2 by FDA under an Emergency Use Authorization (EUA). This EUA will remain in effect (meaning this test can be used) for the duration of the COVID-19 declaration under Section 564(b)(1) of the Act, 21 U.S.C. section 360bbb-3(b)(1), unless the authorization is terminated  or revoked.  Performed at [facility], [address], [place], [place] [address]   Comprehensive metabolic panel     Status: Abnormal   Collection Time: 11/10/20  7:20 PM  Result Value Ref Range   Sodium 136 135 - 145 mmol/L   Potassium 3.9 3.5 - 5.1 mmol/L   Chloride 104 98 - 111 mmol/L   CO2 24 22 - 32 mmol/L   Glucose, Bld 114 (H) 70 - 99 mg/dL    Comment: Glucose reference range applies only to samples taken after fasting for at least 8 hours.   BUN 26 (H) 8 - 23 mg/dL   Creatinine, Ser 1.28 (H) 0.61 - 1.24 mg/dL   Calcium 8.7 (L) 8.9 - 10.3 mg/dL   Total Protein 7.1 6.5 - 8.1 g/dL   Albumin 3.7 3.5 - 5.0 g/dL   AST 44 (H) 15 - 41 U/L   ALT 22 0 - 44 U/L   Alkaline Phosphatase 62 38 - 126 U/L   Total Bilirubin 1.3 (H) 0.3 - 1.2 mg/dL   GFR, Estimated 53 (L) >60 mL/min    Comment: (NOTE) Calculated using the CKD-EPI Creatinine Equation (2021)    Anion gap 8 5 - 15    Comment: Performed at [facility], [address], [place], [place] [address]  CBC with Differential     Status: Abnormal   Collection Time: 11/10/20  7:20 PM  Result Value Ref Range   WBC 14.8 (H) 4.0 - 10.5 K/uL   RBC 4.27 4.22 - 5.81 MIL/uL   Hemoglobin 14.1 13.0 - 17.0 g/dL   HCT 41.8 39.0 - 52.0 %   MCV 97.9 80.0 - 100.0 fL   MCH 33.0 26.0 - 34.0 pg   MCHC 33.7 30.0 - 36.0 g/dL   RDW 13.3 11.5 - 15.5 %   Platelets 300 150 - 400 K/uL   nRBC 0.0 0.0 - 0.2 %   Neutrophils Relative % 88 %   Neutro Abs 13.0 (H) 1.7 - 7.7 K/uL   Lymphocytes Relative 3 %   Lymphs Abs 0.5 (L) 0.7 - 4.0 K/uL   Monocytes Relative 9 %   Monocytes Absolute 1.3 (H) 0.1 - 1.0 K/uL   Eosinophils Relative 0 %   Eosinophils Absolute 0.0 0.0 - 0.5 K/uL   Basophils Relative 0 %   Basophils Absolute 0.0 0.0 - 0.1 K/uL   Immature Granulocytes 0 %   Abs Immature Granulocytes 0.05 0.00 - 0.07 K/uL    Comment: Performed at [facility], [address], [place], [place] [address]  CK     Status: Abnormal   Collection Time: 11/10/20   7:20 PM  Result Value Ref Range   Total CK 999 (H) 49 - 397 U/L    Comment: Performed at [facility], [address], [place], [place] [id number]  Basic metabolic panel     Status: Abnormal   Collection  Time: 11/11/20  5:12 AM  Result Value Ref Range   Sodium 136 135 - 145 mmol/L   Potassium 3.9 3.5 - 5.1 mmol/L   Chloride 107 98 - 111 mmol/L   CO2 22 22 - 32 mmol/L   Glucose, Bld 90 70 - 99 mg/dL    Comment: Glucose reference range applies only to samples taken after fasting for at least 8 hours.   BUN 25 (H) 8 - 23 mg/dL   Creatinine, Ser 1.16 0.61 - 1.24 mg/dL   Calcium 7.9 (L) 8.9 - 10.3 mg/dL   GFR, Estimated 59 (L) >60 mL/min    Comment: (NOTE) Calculated using the CKD-EPI Creatinine Equation (2021)    Anion gap 7 5 - 15    Comment: Performed at [facility], [address] [place], [place] [address]  CBC     Status: Abnormal   Collection Time: 11/11/20  5:12 AM  Result Value Ref Range   WBC 10.9 (H) 4.0 - 10.5 K/uL   RBC 3.87 (L) 4.22 - 5.81 MIL/uL   Hemoglobin 12.7 (L) 13.0 - 17.0 g/dL   HCT 37.8 (L) 39.0 - 52.0 %   MCV 97.7 80.0 - 100.0 fL   MCH 32.8 26.0 - 34.0 pg   MCHC 33.6 30.0 - 36.0 g/dL   RDW 13.4 11.5 - 15.5 %   Platelets 250 150 - 400 K/uL   nRBC 0.0 0.0 - 0.2 %    Comment: Performed at [facility], [address] [place], [place] [address]  CK     Status: Abnormal   Collection Time: 11/11/20  5:12 AM  Result Value Ref Range   Total CK 995 (H) 49 - 397 U/L    Comment: Performed at [facility], [address], [place], [place] [address]  Troponin I (High Sensitivity)     Status: Abnormal   Collection Time: 11/11/20 10:22 AM  Result Value Ref Range   Troponin I (High Sensitivity) 21 (H) <18 ng/L    Comment: (NOTE) Elevated high sensitivity troponin I (hsTnI) values and significant  changes across serial measurements may suggest ACS but many other  chronic and acute conditions are known to elevate hsTnI results.  Refer to the "Links" section for chest  pain algorithms and additional  guidance. Performed at [facility], [address], [place], [place] [address]   Troponin I (High Sensitivity)     Status: Abnormal   Collection Time: 11/11/20 12:09 PM  Result Value Ref Range   Troponin I (High Sensitivity) 21 (H) <18 ng/L    Comment: (NOTE) Elevated high sensitivity troponin I (hsTnI) values and significant  changes across serial measurements may suggest ACS but many other  chronic and acute conditions are known to elevate hsTnI results.  Refer to the "Links" section for chest pain algorithms and additional  guidance. Performed at [facility], [address] [place], [place] [address]   Lactic acid, plasma     Status: None   Collection Time: 11/11/20 12:09 PM  Result Value Ref Range   Lactic Acid, Venous 1.2 0.5 - 1.9 mmol/L    Comment: Performed at [facility], [address], [place],  [address]  Procalcitonin - Baseline     Status: None   Collection Time: 11/11/20 12:09 PM  Result Value Ref Range   Procalcitonin <0.10 ng/mL    Comment:        Interpretation: PCT (Procalcitonin) <= 0.5 ng/mL: Systemic infection (sepsis) is not likely. Local bacterial infection is possible. (  NOTE)       Sepsis PCT Algorithm           Lower Respiratory Tract                                      Infection PCT Algorithm    ----------------------------     ----------------------------         PCT < 0.25 ng/mL                PCT < 0.10 ng/mL          Strongly encourage             Strongly discourage   discontinuation of antibiotics    initiation of antibiotics    ----------------------------     -----------------------------       PCT 0.25 - 0.50 ng/mL            PCT 0.10 - 0.25 ng/mL               OR       >80% decrease in PCT            Discourage initiation of                                            antibiotics      Encourage discontinuation           of antibiotics    ----------------------------      -----------------------------         PCT >= 0.50 ng/mL              PCT 0.26 - 0.50 ng/mL               AND        <80% decrease in PCT             Encourage initiation of                                             antibiotics       Encourage continuation           of antibiotics    ----------------------------     -----------------------------        PCT >= 0.50 ng/mL                  PCT > 0.50 ng/mL               AND         increase in PCT                  Strongly encourage                                      initiation of antibiotics    Strongly encourage escalation           of antibiotics                                     -----------------------------  PCT <= 0.25 ng/mL                                                 OR                                        > 80% decrease in PCT                                      Discontinue / Do not initiate                                             antibiotics  Performed at [facility], [address], [place], [place] [address]     Studies/Results:   BRAIN MRI MRA IMPRESSION: MRI:   1. Acute cortical infarcts in the left frontal lobe and insula with additional smaller acute infarcts in the left parietal and right frontoparietal lobes. Mild associated edema without mass effect. Given involvement of multiple vascular territories, consider an embolic etiology. 2. Moderate chronic microvascular ischemic disease. 3. Multiple sclerotic lesions in the partially imaged cervical spine, concerning for metastases. Postcontrast imaging of head could provide more sensitive evaluation for intracranial metastatic disease if clinically indicated. 4. Moderate paranasal sinus disease with air-fluid levels, detailed above.   MRA:   1. Severe stenosis of a proximal left M2 MCA branch in the region of left frontal infarcts described above. Multifocal mild-to-moderate stenosis of multiple  additional MCA branches bilaterally with poor evaluation distally. 2. Poor visualization of the ACAs with suspected severe left and moderate right A2 ACA stenosis. 3. Approximately 2 mm anteriorly directed outpouching arising from the proximal right PCA, concerning for aneurysm. 4. Approximately 2-3 mm outpouching arising from the left paraclinoid ICA anteriorly, suggestive of aneurysm versus opthalmic artery infundibulum with vessel too small to see by MRA. Additional outpouching arising from the right paraclinoid ICA is favored to be an infunidbulum. 5. A CTA could better characterize the above findings if clinically indicated.     The brain MRI is reviewed impression in shows multiple mostly cortical increased signal on DWI consistent with acute ischemic stroke involving the insular cortex and subcortical region, anterior temporal area and the frontal areas on the left side. There is also a single signal seen involving the right frontal region. There is moderate to severe confluent chronic microvascular changes. No hemorrhages noted.    Gerrad Welker A. Merlene Laughter, M.D.  Diplomate, Tax adviser of Psychiatry and Neurology ( Neurology). 11/11/2020, 6:37 PM

## 2020-11-11 NOTE — Plan of Care (Signed)
  Problem: Education: Goal: Knowledge of General Education information will improve Description Including pain rating scale, medication(s)/side effects and non-pharmacologic comfort measures Outcome: Progressing   

## 2020-11-11 NOTE — Consult Note (Signed)
Consultation Note Date: 11/11/2020   Patient Name: Caleb Nunez  DOB: May 21, 1927  MRN: [id number]  Age / Sex: 87 y.o., male  PCP: Orpah Melter, MD Referring Physician: Deatra James, MD  Reason for Consultation: Establishing goals of care and Psychosocial/spiritual support  HPI/Patient Profile: 87 y.o. male  with past medical history of metastatic prostate cancer with burden to the bone.  CAD, GI bleed from AVM, paroxysmal A. fib, admitted on 11/10/2020 with altered mental status/metabolic encephalopathy, rhabdomyolysis.   Clinical Assessment and Goals of Care: I have reviewed medical records including EPIC notes, labs and imaging, received report from RN, assessed the patient and then met at the bedside along with daughter/HC POA, Caleb Nunez to discuss diagnosis prognosis, GOC, EOL wishes, disposition and options.  I introduced Palliative Medicine as specialized medical care for people living with serious illness. It focuses on providing relief from the symptoms and stress of a serious illness. The goal is to improve quality of life for both the patient and the family.  Family states that she is her father's healthcare power of attorney.  She states tells me that he had been living independently in an ALF until this incident.  We then focused on their current illness.  We talked about the treatment plan including, but not limited to head CT completed, MRI scheduled, further lab tests, time for outcomes.  Daughter asks about Mr. Whittlesey sleeping so much, and I share that that is perfectly normal during this time.  She talks about food and fluid intake, and I share that Mr. Malecha is receiving IV fluids, and to be careful giving him meals if he is not awake.  She agrees.  Family denies questions or concerns at this time.    Discussed the importance of continued conversation with family and the medical providers  regarding overall plan of care and treatment options, ensuring decisions are within the context of the patient's values and GOCs.  Questions and concerns were addressed.  The family was encouraged to call with questions or concerns.  PMT will continue to support holistically.  Conference with attending related to patient condition, needs, goals of care.   HCPOA   HCPOA -daughter, Caleb Nunez    SUMMARY OF RECOMMENDATIONS   At this point full scope/full code Time for outcomes Goal is to return to ALF   Code Status/Advance Care Planning: Full code -Per chart, not discussed with daughter and patient today.  Symptom Management:  Per hospitalist, no additional needs at this time.  Palliative Prophylaxis:  Oral Care and Turn Reposition  Additional Recommendations (Limitations, Scope, Preferences): Full Scope Treatment  Psycho-social/Spiritual:  Desire for further Chaplaincy support:no Additional Recommendations: Caregiving  Support/Resources  Prognosis:  Unable to determine, based on outcomes.  Discharge Planning:  To be determined, based on outcomes.  Need for short-term rehab would not be surprising.       Primary Diagnoses: Present on Admission:  AMS (altered mental status)  Prostate cancer metastatic to bone ([place])  AF (paroxysmal atrial fibrillation) ([place])  Rhabdomyolysis  Altered mental status   I have reviewed the medical record, interviewed the patient and family, and examined the patient. The following aspects are pertinent.  History reviewed. No pertinent past medical history. Social History   Socioeconomic History   Marital status: Widowed    Spouse name: Not on file   Number of children: Not on file   Years of education: Not on file   Highest education level: Not on file  Occupational History   Not on file  Tobacco Use   Smoking status: Not on file   Smokeless tobacco: Not on file  Substance and Sexual Activity   Alcohol use: Not on file   Drug  use: Not on file   Sexual activity: Not on file  Other Topics Concern   Not on file  Social History Narrative   Not on file   Social Determinants of Health   Financial Resource Strain: Not on file  Food Insecurity: Not on file  Transportation Needs: Not on file  Physical Activity: Not on file  Stress: Not on file  Social Connections: Not on file   History reviewed. No pertinent family history. Scheduled Meds:  enoxaparin (LOVENOX) injection  40 mg Subcutaneous Q24H   Continuous Infusions:  lactated ringers 125 mL/hr at 11/11/20 0741   PRN Meds:.acetaminophen **OR** acetaminophen, ondansetron **OR** ondansetron (ZOFRAN) IV, polyethylene glycol Medications Prior to Admission:  Prior to Admission medications   Medication Sig Start Date End Date Taking? Authorizing Provider  amiodarone (PACERONE) 100 MG tablet Take 100 mg by mouth daily. 05/30/19  Yes [provider]  atorvastatin (LIPITOR) 80 MG tablet Take 80 mg by mouth daily. 05/29/20  Yes [provider]  calcium citrate-vitamin D (CITRACAL+D) 315-200 MG-UNIT tablet Take by mouth. 02/01/19  Yes [provider]  [employer] VITAMIN B12 1000 MCG tablet Take 1,000 mcg by mouth daily. 10/03/20  Yes [provider]  ferrous sulfate 325 (65 FE) MG EC tablet Take by mouth. 01/16/19  Yes [provider]  leuprolide (LUPRON) 22.5 MG injection Inject into the muscle.   Yes [provider]  carvedilol (COREG) 3.125 MG tablet Oral Patient not taking: No sig reported    [provider]  furosemide (LASIX) 40 MG tablet Oral Patient not taking: No sig reported    [provider]  lisinopril (ZESTRIL) 5 MG tablet Oral Patient not taking: No sig reported    [provider]   No Known Allergies Review of Systems  Unable to perform ROS: Acuity of condition   Physical Exam Vitals and nursing note reviewed.  Constitutional:      General: He is not in acute distress.     Appearance: He is ill-appearing.  HENT:     Mouth/Throat:     Mouth: Mucous membranes are dry.  Cardiovascular:     Rate and Rhythm: Normal rate.  Pulmonary:     Effort: Pulmonary effort is normal. No respiratory distress.  Skin:    General: Skin is warm and dry.  Neurological:     Comments: Lethargic, smiles but does not answer my questions    Vital Signs: BP (!) 174/83 (BP Location: Left Arm)   Pulse 74   Temp 97.6 F (36.4 C) (Oral)   Resp 20   Ht _0  (1.753 m)   Wt 63.5 kg   SpO2 100%   BMI 20.67 kg/m  Pain Scale: 0-10   Pain Score: 0-No pain   SpO2: SpO2: 100 % O2 Device:SpO2: 100 %  O2 Flow Rate: .   IO: Intake/output summary:  Intake/Output Summary (Last 24 hours) at 11/11/2020 1343 Last data filed at 11/11/2020 0300 Gross per 24 hour  Intake 1154.93 ml  Output --  Net 1154.93 ml    LBM: Last BM Date:  (patient unable to answer at this time) Baseline Weight: Weight: 63.5 kg Most recent weight: Weight: 63.5 kg     Palliative Assessment/Data:   Flowsheet Rows    Flowsheet Row Most Recent Value  Intake Tab   Referral Department Hospitalist  Unit at Time of Referral Cardiac/Telemetry Unit  Palliative Care Primary Diagnosis Neurology  Date Notified 11/11/20  Palliative Care Type New Palliative care  Reason for referral Clarify Goals of Care  Date of Admission 11/10/20  Date first seen by Palliative Care 11/11/20  # of days Palliative referral response time 0 Day(s)  # of days IP prior to Palliative referral 1  Clinical Assessment   Palliative Performance Scale Score 20%  Pain Max last 24 hours Not able to report  Pain Min Last 24 hours Not able to report  Dyspnea Max Last 24 Hours Not able to report  Dyspnea Min Last 24 hours Not able to report  Psychosocial & Spiritual Assessment   Palliative Care Outcomes        Time In: 1100 Time Out: 1130 Time Total: 30 minutse  Greater than 50%  of this time was spent counseling and coordinating care  related to the above assessment and plan.  Signed by: Drue Novel, NP   Please contact Palliative Medicine Team phone at [phone] for questions and concerns.  For individual provider: See Shea Evans

## 2020-11-11 NOTE — Progress Notes (Signed)
PROGRESS NOTE    Patient: Caleb Nunez                            PCP: Orpah Melter, MD                    DOB: 1927-11-30            DOA: 11/10/2020 [id number]             DOS: 11/11/2020, 11:04 AM   LOS: 0 days   Date of Service: The patient was seen and examined on 11/11/2020  Subjective:   The patient was seen and examined this morning. Afebrile normotensive, remained lethargic Still complaining of: Confusion, generalized weaknesses,  Daughter present at bedside  Brief Narrative:   Caleb Nunez is a 87 y.o. male with medical history significant for metastatic prostate cancer, coronary artery disease, GI bleed from AVM, paroxysmal atrial fibrillation. [facility] of altered mental status. Daughter at bedside provided history, stating patient is functional ambulates with no assist with clear minded at baseline.  ED Course: Respiratory 21-25, afebrile temperature 98.7.  Blood pressure 130s to 160s.  WBC 14.8.  Creatinine 1.28.  CK elevated at 999.  Head CT without acute abnormality.  Chest x-ray unremarkable.  UA not suggestive of infection.  500 mill bolus given.   Assessment & Plan:   Principal Problem:   AMS (altered mental status) Active Problems:   Prostate cancer metastatic to bone ([facility])   AF (paroxysmal atrial fibrillation) ([facility])   Rhabdomyolysis  Metabolic encephalopathy -Patient remains lethargic, somewhat confused, responding very slowly to question Answered 1 out of 3 questions correctly -Daughter present at bedside stating mentation is not at baseline -CT of the head, UA, chest x-ray, unremarkable -On admission WBC 14.8 ..  No signs of sepsis, or overt infection, obtaining lactic acid and procalcitonin level -Currently afebrile normotensive  -We will continue with neurochecks -Since no improvement in mentation, will pursue with MRI of the brain   Rhabdomyolysis -CK  999 >> 995 -  Found down,  unknown how long patient was on the floor. -  1L  bolus given, will resume aggressive IV fluid resuscitation with normal saline 125 an hour for now 1 anticipation to de-escalate in next 12-24 hours -We will monitor total CK    Metastatic prostate cancer to bone -Stable -diagnosed 2019, follows with hematologist Dr. Archie Balboa at [name].  Per notes 08/2020, undetectable PSA: He is on regular Lupron injections and on denosumab.   Paroxysmal atrial fibrillation-currently sinus rhythm.   Not on anticoagulation due to recurrent GI bleeds with multiple AVM and increased age. -Awaiting med reconciliation, no response from nursing home   GI bleed history-hemoglobin stable, will monitor closely   Coronary artery disease, first-degree AV block not new.  History of stent placement 2019.  Per cardiology note history of reduced ejection fraction, also mentioned that EF improved.   -Pending med reconciliation, hold Lasix, carvedilol. -We will obtain EKG and cardiac enzymes  -------------------------------------------------------------------------------------------------------------------------   DVT prophylaxis: Lovenox Code Status: Full code-confirmed with daughter Caleb Nunez at bedside Family Communication: Spoke to patient's daughter Caleb Nunez at bedside, she is patient's healthcare power of attorney.  No response on calling nursing home. Disposition Plan:  ~ 1 -2 days back to     Cultures; none  Antimicrobials: None   Consultants: None  --------------------------------------------------------------------------------------------------------------------------------- DVT prophylaxis:  SCD/Compression  stockings and Lovenox SQ Code Status:   Code Status: Full Code  Family Communication: No family member present at bedside- attempt will be made to update daily The above findings and plan of care has been discussed with patient (and family)  in detail,  they expressed  understanding and agreement of above. -Advance care planning has been discussed.   Admission status:   Status is: Observation  The patient remains OBS appropriate and will d/c before 2 midnights.  Dispo: The patient is from: ALF              Anticipated d/c is to: ALF within 1-2 days               Patient currently is not medically stable to d/c.   Difficult to place patient No      Level of care: Telemetry   Procedures:   No admission procedures for hospital encounter.    Antimicrobials:  Anti-infectives (From admission, onward)    None        Medication:   enoxaparin (LOVENOX) injection  40 mg Subcutaneous Q24H    acetaminophen **OR** acetaminophen, ondansetron **OR** ondansetron (ZOFRAN) IV, polyethylene glycol   Objective:   Vitals:   11/10/20 2300 11/10/20 2307 11/11/20 0305 11/11/20 0742  BP: (!) 164/144 (!) 145/74 (!) 147/95 140/63  Pulse:  83 81 65  Resp: (!) '22 20 19 20  '$ Temp:  97.8 F (36.6 C) 98.3 F (36.8 C) (!) 97.5 F (36.4 C)  TempSrc:  Oral Oral Oral  SpO2: 93% 93% 99% 98%  Weight:      Height:        Intake/Output Summary (Last 24 hours) at 11/11/2020 1105 Last data filed at 11/11/2020 0300 Gross per 24 hour  Intake 1154.93 ml  Output --  Net 1154.93 ml   Filed Weights   11/10/20 1834  Weight: 63.5 kg     Examination:   Physical Exam  Constitution: Patient remains lethargic, slow in responding to questions but responds appropriately Psychiatric: Slow in responding to questions, mood remained stable  HEENT: Normocephalic, PERRL, otherwise with in Normal limits  Chest:Chest symmetric Cardio vascular:  S1/S2, RRR, No murmure, No Rubs or Gallops  pulmonary: Clear to auscultation bilaterally, respirations unlabored, negative wheezes / crackles Abdomen: Soft, non-tender, non-distended, bowel sounds,no masses, no organomegaly Muscular skeletal: Global generalized weaknesses, Limited exam - in bed, able to move all 4  extremities,  Neuro: CNII-XII intact. , normal motor and sensation, reflexes intact  Extremities: No pitting edema lower extremities, +2 pulses  Skin: Dry, warm to touch, negative for any Rashes, No open wounds Wounds: per nursing documentation    ------------------------------------------------------------------------------------------------------------------------------------------    LABs:  CBC Latest Ref Rng & Units 11/11/2020 11/10/2020  WBC 4.0 - 10.5 K/uL 10.9(H) 14.8(H)  Hemoglobin 13.0 - 17.0 g/dL 12.7(L) 14.1  Hematocrit 39.0 - 52.0 % 37.8(L) 41.8  Platelets 150 - 400 K/uL 250 300   CMP Latest Ref Rng & Units 11/11/2020 11/10/2020  Glucose 70 - 99 mg/dL 90 114(H)  BUN 8 - 23 mg/dL 25(H) 26(H)  Creatinine 0.61 - 1.24 mg/dL 1.16 1.28(H)  Sodium 135 - 145 mmol/L 136 136  Potassium 3.5 - 5.1 mmol/L 3.9 3.9  Chloride 98 - 111 mmol/L 107 104  CO2 22 - 32 mmol/L 22 24  Calcium 8.9 - 10.3 mg/dL 7.9(L) 8.7(L)  Total Protein 6.5 - 8.1 g/dL - 7.1  Total Bilirubin 0.3 - 1.2 mg/dL - 1.3(H)  Alkaline Phos 38 - 126  U/L - 62  AST 15 - 41 U/L - 44(H)  ALT 0 - 44 U/L - 22       Micro Results Recent Results (from the past 240 hour(s))  Resp Panel by RT-PCR (Flu A&B, Covid) Nasopharyngeal Swab     Status: None   Collection Time: 11/10/20  7:09 PM   Specimen: Nasopharyngeal Swab; Nasopharyngeal(NP) swabs in vial transport medium  Result Value Ref Range Status   SARS Coronavirus 2 by RT PCR NEGATIVE NEGATIVE Final    Comment: (NOTE) SARS-CoV-2 target nucleic acids are NOT DETECTED.  The SARS-CoV-2 RNA is generally detectable in upper respiratory specimens during the acute phase of infection. The lowest concentration of SARS-CoV-2 viral copies this assay can detect is 138 copies/mL. A negative result does not preclude SARS-Cov-2 infection and should not be used as the sole basis for treatment or other patient management decisions. A negative result may occur with  improper specimen  collection/handling, submission of specimen other than nasopharyngeal swab, presence of viral mutation(s) within the areas targeted by this assay, and inadequate number of viral copies(<138 copies/mL). A negative result must be combined with clinical observations, patient history, and epidemiological information. The expected result is Negative.  Fact Sheet for Patients:  EntrepreneurPulse.com.au  Fact Sheet for Healthcare Providers:  IncredibleEmployment.be  This test is no t yet approved or cleared by the [place] FDA and  has been authorized for detection and/or diagnosis of SARS-CoV-2 by FDA under an Emergency Use Authorization (EUA). This EUA will remain  in effect (meaning this test can be used) for the duration of the COVID-19 declaration under Section 564(b)(1) of the Act, 21 U.S.C.section 360bbb-3(b)(1), unless the authorization is terminated  or revoked sooner.       Influenza A by PCR NEGATIVE NEGATIVE Final   Influenza B by PCR NEGATIVE NEGATIVE Final    Comment: (NOTE) The Xpert Xpress SARS-CoV-2/FLU/RSV plus assay is intended as an aid in the diagnosis of influenza from Nasopharyngeal swab specimens and should not be used as a sole basis for treatment. Nasal washings and aspirates are unacceptable for Xpert Xpress SARS-CoV-2/FLU/RSV testing.  Fact Sheet for Patients: EntrepreneurPulse.com.au  Fact Sheet for Healthcare Providers: IncredibleEmployment.be  This test is not yet approved or cleared by the [place] FDA and has been authorized for detection and/or diagnosis of SARS-CoV-2 by FDA under an Emergency Use Authorization (EUA). This EUA will remain in effect (meaning this test can be used) for the duration of the COVID-19 declaration under Section 564(b)(1) of the Act, 21 U.S.C. section 360bbb-3(b)(1), unless the authorization is terminated or revoked.  Performed at [facility], [address] [place], [place] [address]     Radiology Reports CT Head Wo Contrast  Result Date: 11/10/2020 CLINICAL DATA:  Altered mental status EXAM: CT HEAD WITHOUT CONTRAST TECHNIQUE: Contiguous axial images were obtained from the base of the skull through the vertex without intravenous contrast. COMPARISON:  None. FINDINGS: Brain: Diffuse low-density throughout the deep white matter, likely chronic small vessel disease. No acute intracranial abnormality. Specifically, no hemorrhage, hydrocephalus, mass lesion, acute infarction, or significant intracranial injury. Vascular: No hyperdense vessel or unexpected calcification. Skull: No acute calvarial abnormality. Sinuses/Orbits: Air-fluid levels in the left sphenoid sinus and bilateral maxillary sinuses. Other: None IMPRESSION: Chronic small vessel disease. No acute intracranial abnormality. Acute sinusitis. Electronically Signed   By: Rolm Baptise M.D.   On: 11/10/2020 20:12   CT Cervical Spine Wo Contrast  Result Date: 11/10/2020 CLINICAL DATA:  Unwitnessed  fall EXAM: CT CERVICAL SPINE WITHOUT CONTRAST TECHNIQUE: Multidetector CT imaging of the cervical spine was performed without intravenous contrast. Multiplanar CT image reconstructions were also generated. COMPARISON:  None. FINDINGS: Alignment: No subluxation Skull base and vertebrae: No fracture. There are multiple sclerotic lesions throughout the cervical spine. Soft tissues and spinal canal: No prevertebral fluid or swelling. No visible canal hematoma. Disc levels:  Maintained.  Degenerative facet disease. Upper chest: Sclerotic lesions in the upper ribs. Other: None IMPRESSION: No evidence of fracture. 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 Electronically Signed   By: Rolm Baptise M.D.   On: 11/10/2020 20:14   DG Chest Port 1 View  Result Date: 11/10/2020 CLINICAL DATA:  Altered mental status.  Unwitnessed fall.  EXAM: PORTABLE CHEST 1 VIEW COMPARISON:  None. FINDINGS: Shallow inspiration. Heart size and pulmonary vascularity are normal for inspiratory effort. There is a mild interstitial pattern seen to the lungs with some peribronchial thickening. This is likely chronic bronchitis although early multifocal pneumonia could have this appearance in the appropriate clinical setting. No focal consolidation. No pleural effusions. No pneumothorax. Mediastinal contours appear intact. Calcification of the aorta. Visualized bones are nondepressed. IMPRESSION: Chronic bronchitic changes in the lungs. No active pulmonary disease. Electronically Signed   By: Lucienne Capers M.D.   On: 11/10/2020 19:21    SIGNED: Deatra James, MD, FHM. [facility],  Pager (please use [email or web address] to page/text) Please use Epic Secure Chat for non-urgent communication (7AM-7PM)  If 7PM-7AM, please contact night-coverage [email or web address], 11/11/2020, 11:05 AM

## 2020-11-12 ENCOUNTER — Inpatient Hospital Stay (HOSPITAL_COMMUNITY): Payer: Medicare Other

## 2020-11-12 DIAGNOSIS — I639 Cerebral infarction, unspecified: Secondary | ICD-10-CM

## 2020-11-12 DIAGNOSIS — I6389 Other cerebral infarction: Secondary | ICD-10-CM

## 2020-11-12 LAB — ECHOCARDIOGRAM COMPLETE
AR max vel: 1.9 cm2
AV Area VTI: 1.9 cm2
AV Area mean vel: 1.75 cm2
AV Mean grad: 6 mmHg
AV Peak grad: 10.8 mmHg
Ao pk vel: 1.64 m/s
Area-P 1/2: 3.24 cm2
Height: 69 in
MV VTI: 2.05 cm2
S' Lateral: 3.33 cm
Weight: 2240 oz

## 2020-11-12 MED ORDER — CLOPIDOGREL BISULFATE 75 MG PO TABS
75.0000 mg | ORAL_TABLET | Freq: Every day | ORAL | Status: DC
  Administered 2020-11-13: 75 mg via ORAL
  Filled 2020-11-12: qty 1

## 2020-11-12 NOTE — Plan of Care (Signed)
  Problem: Acute Rehab OT Goals (only OT should resolve) Goal: Pt. Will Perform Eating Flowsheets (Taken 11/12/2020 0957) Pt Will Perform Eating:  with modified independence  Independently  sitting  bed level Goal: Pt. Will Perform Grooming Flowsheets (Taken 11/12/2020 0957) Pt Will Perform Grooming:  with modified independence  standing  with adaptive equipment Goal: Pt. Will Perform Lower Body Dressing Flowsheets (Taken 11/12/2020 0957) Pt Will Perform Lower Body Dressing:  with modified independence  sitting/lateral leans  sit to/from stand Goal: Pt. Will Transfer To Toilet Flowsheets (Taken 11/12/2020 0957) Pt Will Transfer to Toilet:  with modified independence  stand pivot transfer  ambulating Goal: Pt/Caregiver Will Perform Home Exercise Program Flowsheets (Taken 11/12/2020 0957) Pt/caregiver will Perform Home Exercise Program:  Increased strength  Both right and left upper extremity  With Supervision  Jaece Ducharme OT, MOT

## 2020-11-12 NOTE — Plan of Care (Signed)
  Problem: Acute Rehab PT Goals(only PT should resolve) Goal: Pt Will Go Supine/Side To Sit Outcome: Progressing Flowsheets (Taken 11/12/2020 1147) Pt will go Supine/Side to Sit:  Independently  with modified independence Goal: Patient Will Transfer Sit To/From Stand Outcome: Progressing Flowsheets (Taken 11/12/2020 1147) Patient will transfer sit to/from stand:  with modified independence  with supervision Goal: Pt Will Transfer Bed To Chair/Chair To Bed Outcome: Progressing Flowsheets (Taken 11/12/2020 1147) Pt will Transfer Bed to Chair/Chair to Bed:  with modified independence  with supervision Goal: Pt Will Ambulate Outcome: Progressing Flowsheets (Taken 11/12/2020 1147) Pt will Ambulate:  100 feet  with supervision  with modified independence  with cane  with least restrictive assistive device   11:48 AM, 11/12/20 Lonell Grandchild, MPT Physical Therapist with [facility] [phone] office [phone] mobile phone

## 2020-11-12 NOTE — Evaluation (Signed)
Speech Language Pathology Evaluation Patient Details Name: Caleb Nunez MRN: [id number] DOB: 09-05-1927 Today's Date: 11/12/2020 Time: [id number] SLP Time Calculation (min) (ACUTE ONLY): 28 min  Problem List:  Patient Active Problem List   Diagnosis Date Noted   Altered mental status 11/11/2020   AMS (altered mental status) 11/10/2020   Prostate cancer metastatic to bone ([place]) 11/10/2020   AF (paroxysmal atrial fibrillation) ([place]) 11/10/2020   Rhabdomyolysis 11/10/2020   Past Medical History: History reviewed. No pertinent past medical history. Past Surgical History: History reviewed. No pertinent surgical history. HPI:  Collen Nunez is a 87 y.o. male with medical history significant for metastatic prostate cancer, coronary artery disease, GI bleed from AVM, paroxysmal atrial fibrillation.  [facility].  Daughter at bedside provided history, stating patient is functional ambulates with no assist with clear minded at baseline. 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 Pt passed [facility] swallow screen, but needs SLE due to aphasia.   Assessment / Plan / Recommendation Clinical Impression  Pt presents with at least moderate expressive aphasia and mild receptive aphasia, however Pt with reduced participation to fully examine today. His daughter was present for the evaluation and indicated that her father is doing "much better" today than yesterday in terms of communication. She also indicates that he is a private person who enjoys solitude. He stays in his room and watches television all day at [facility]. He eats his meals in his room as well and is not interested in any of the social activities at the facility. Family lives nearby and visit him for short periods of time. Pt is primarily  communicating in single automatic words and phrases at this time without content words. He was heard saying, "oh you know, no, yes, not really" when asked questions regarding personally relevant information (job, hobbies, what he ate for breakfast). He smiled and laughed appropriately and yes/no responses were ~75% accurate. Pt resistant to any formal testing and gave limited responses and several refusals to informal tasks. Pt will need ongoing SLP therapy to address his expressive and receptive language deficits (aphasia), however his motivation to participate is questionable given daughter's report of premorbid personality. SLP encouraged his daughter to provide two choices when communicating with Pt, ask yes/no questions and supplement with gestures as needed, and to announce topics being discussed before transitioning to a new topic. SLP will plan to see Pt in acute setting, however recommend home health/SNF follow up for SLP services post discharge from acute.    SLP Assessment  SLP Recommendation/Assessment: Patient needs continued Speech Lanaguage Pathology Services SLP Visit Diagnosis: Aphasia (R47.01)    Follow Up Recommendations  Home health SLP (pendingn PT recommendations)    Frequency and Duration min 2x/week  1 week      SLP Evaluation Cognition  Overall Cognitive Status: Impaired/Different from baseline Arousal/Alertness: Awake/alert Orientation Level: Disoriented to place;Disoriented to situation Month:  (Pt unable) Problem Solving: Impaired Problem Solving Impairment: Verbal complex Comments: Difficult to assess due to aphasia and Pt with decreased willingness to participate       Comprehension  Auditory Comprehension Overall Auditory Comprehension: Impaired Yes/No Questions: Impaired Basic Biographical Questions: 51-75% accurate Commands: Impaired One Step Basic Commands: 75-100% accurate Two Step Basic Commands: 50-74% accurate Conversation:  Simple EffectiveTechniques: Repetition;Visual/Gestural cues;Extra processing time Visual Recognition/Discrimination Discrimination: Not  tested Reading Comprehension Reading Status: Impaired Sentence Level: Impaired Paragraph Level: Not tested Interfering Components: Visual acuity;Eye glasses not available Effective Techniques: Large print;Verbal cueing;Visual cueing    Expression Expression Primary Mode of Expression: Verbal Verbal Expression Overall Verbal Expression: Impaired Initiation: Impaired Automatic Speech: Social Response Level of Generative/Spontaneous Verbalization: Word Repetition: Impaired Level of Impairment: Word level Naming: Impairment Responsive: 26-50% accurate Confrontation: Impaired Convergent: 25-49% accurate Divergent: Not tested Pragmatics:  (Pt enjoys being alone per daughter) Effective Techniques: Phonemic cues Non-Verbal Means of Communication: Not applicable Written Expression Dominant Hand: Right Written Expression: Not tested   Oral / Motor  Oral Motor/Sensory Function Overall Oral Motor/Sensory Function: Within functional limits Motor Speech Overall Motor Speech: Appears within functional limits for tasks assessed Respiration: Within functional limits Phonation: Normal Resonance: Within functional limits Articulation: Within functional limitis Intelligibility: Intelligible Motor Planning: Witnin functional limits Motor Speech Errors: Not applicable   Thank you,  Genene Churn, [place]                     [place] 11/12/2020, 9:37 AM

## 2020-11-12 NOTE — Evaluation (Signed)
Physical Therapy Evaluation Patient Details Name: Caleb Nunez MRN: [id number] DOB: 1927/07/31 Today's Date: 11/12/2020   History of Present Illness  Caleb Nunez is a 87 y.o. male with medical history significant for metastatic prostate cancer, coronary artery disease, GI bleed from AVM, paroxysmal atrial fibrillation.  [facility].  Patient's daughter is at bedside and helps with the history, at the time of my evaluation patient is awake, but confused.  Patient's daughter reports that today patient was found down on the floor and it was unknown how long patient had been on the floor.  Patient did not come out to get his lunch, so they checked on him and found him on the floor.  Daughter reports patient was dressed, so it is likely he had breakfast.   At baseline patient is awake alert and oriented, able to answer questions appropriately.  No history of memory problems.  Patient daughter Caleb Nunez last saw patient yesterday, he had no complaints, and has been doing well.  No history of seizures.  No headaches.   Clinical Impression  Patient functioning near baseline for functional mobility and gait demonstrating slightly labored cadence with decreased arm swing, mild drifting to the left and tendency to lean on nearby objects for support.  Patient demonstrates improvement using SPC when taking steps without having to lean on nearby objects for support, but tends to hold cane off floor most of time.  Patient tolerated sitting up in chair after therapy with his daughter present in room.  Patient will benefit from continued physical therapy in hospital and recommended venue below to increase strength, balance, endurance for safe ADLs and gait.       Follow Up Recommendations Home health PT;Supervision - Intermittent    Equipment Recommendations  Cane    Recommendations for Other Services       Precautions / Restrictions  Precautions Precautions: Fall Restrictions Weight Bearing Restrictions: No      Mobility  Bed Mobility Overal bed mobility: Modified Independent             General bed mobility comments: as per OT notes    Transfers Overall transfer level: Needs assistance Equipment used: Straight cane Transfers: Sit to/from Stand;Stand Pivot Transfers Sit to Stand: Supervision Stand pivot transfers: Supervision       General transfer comment: as per OT notes  Ambulation/Gait Ambulation/Gait assistance: Supervision Gait Distance (Feet): 75 Feet Assistive device: None;1 person hand held assist;Straight cane Gait Pattern/deviations: Decreased step length - right;Decreased step length - left;Decreased stride length;Step-through pattern;Drifts right/left Gait velocity: decreased   General Gait Details: slightly labored cadence with tendency to request hand held assist with mild drifting to the left, able to ambulate without hand held assist with limited armswing due to guarding, demonstrates fair return for using SPC without loss of balance without having to lean on nearby objects for support, has tendency to hold cane off floor instead of touching down  Stairs            Wheelchair Mobility    Modified Rankin (Stroke Patients Only)       Balance Overall balance assessment: Needs assistance Sitting-balance support: Feet supported;No upper extremity supported Sitting balance-Leahy Scale: Good Sitting balance - Comments: seated at EOB   Standing balance support: During functional activity;No upper extremity supported Standing balance-Leahy Scale: Fair Standing balance comment: fair/good using SPC or hand held assist  Pertinent Vitals/Pain Pain Assessment: No/denies pain    Home Living Family/patient expects to be discharged to:: Private residence Living Arrangements: Alone Available Help at Discharge: Family;Available  PRN/intermittently;Other (Comment) Type of Home: Independent living facility Home Access: Level entry     Home Layout: One level Home Equipment: None Additional Comments: Pt's daughter present and assisting with home living report.    Prior Function Level of Independence: Needs assistance   Gait / Transfers Assistance Needed: Household ambulator without AD.  ADL's / Homemaking Assistance Needed: Pt is independent for ADL's. Daughter reports that pt can cook for himself but he does have lunch delivered by the indpendent living facility.        Hand Dominance   Dominant Hand: Right    Extremity/Trunk Assessment   Upper Extremity Assessment Upper Extremity Assessment: Defer to OT evaluation    Lower Extremity Assessment Lower Extremity Assessment: Overall WFL for tasks assessed    Cervical / Trunk Assessment Cervical / Trunk Assessment: Normal  Communication   Communication: No difficulties  Cognition Arousal/Alertness: Awake/alert Behavior During Therapy: WFL for tasks assessed/performed Overall Cognitive Status: Impaired/Different from baseline Area of Impairment: Following commands                       Following Commands:  (Pt had difficulty following commands for visual assessment, but did well with commands for ambulation and most of UE assessment.)       General Comments: easily frustrated when asked to perform some tasks, but agreeable after encouragement      General Comments      Exercises     Assessment/Plan    PT Assessment Patient needs continued PT services  PT Problem List Decreased strength;Decreased activity tolerance;Decreased balance;Decreased mobility       PT Treatment Interventions DME instruction;Gait training;Stair training;Functional mobility training;Therapeutic activities;Therapeutic exercise;Patient/family education;Balance training    PT Goals (Current goals can be found in the Care Plan section)  Acute Rehab PT  Goals Patient Stated Goal: return to independent living facility PT Goal Formulation: With patient/family Time For Goal Achievement: 11/14/20 Potential to Achieve Goals: Good    Frequency Min 3X/week   Barriers to discharge        Co-evaluation PT/OT/SLP Co-Evaluation/Treatment: Yes Reason for Co-Treatment: To address functional/ADL transfers PT goals addressed during session: Mobility/safety with mobility;Proper use of DME;Balance OT goals addressed during session: Strengthening/ROM;ADL's and self-care       AM-PAC PT "6 Clicks" Mobility  Outcome Measure Help needed turning from your back to your side while in a flat bed without using bedrails?: None Help needed moving from lying on your back to sitting on the side of a flat bed without using bedrails?: None Help needed moving to and from a bed to a chair (including a wheelchair)?: A Little Help needed standing up from a chair using your arms (e.g., wheelchair or bedside chair)?: A Little Help needed to walk in hospital room?: A Little Help needed climbing 3-5 steps with a railing? : A Little 6 Click Score: 20    End of Session   Activity Tolerance: Patient tolerated treatment well;Patient limited by fatigue Patient left: in chair;with call bell/phone within reach;with family/visitor present Nurse Communication: Mobility status PT Visit Diagnosis: Unsteadiness on feet (R26.81);Other abnormalities of gait and mobility (R26.89);Other symptoms and signs involving the nervous system [id number])    Time: [id number] PT Time Calculation (min) (ACUTE ONLY): 31 min   Charges:   PT Evaluation $PT Eval Moderate Complexity: 1  Mod PT Treatments $Therapeutic Activity: 23-37 mins        11:46 AM, 11/12/20 Lonell Grandchild, MPT Physical Therapist with [facility] [phone] office [phone] mobile phone

## 2020-11-12 NOTE — Evaluation (Signed)
Occupational Therapy Evaluation Patient Details Name: Caleb Nunez MRN: [id number] DOB: 1927-06-02 Today's Date: 11/12/2020    History of Present Illness Caleb Nunez is a 87 y.o. male with medical history significant for metastatic prostate cancer, coronary artery disease, GI bleed from AVM, paroxysmal atrial fibrillation.  [facility].  Patient's daughter is at bedside and helps with the history, at the time of my evaluation patient is awake, but confused.  Patient's daughter reports that today patient was found down on the floor and it was unknown how long patient had been on the floor.  Patient did not come out to get his lunch, so they checked on him and found him on the floor.  Daughter reports patient was dressed, so it is likely he had breakfast.   At baseline patient is awake alert and oriented, able to answer questions appropriately.  No history of memory problems.  Patient daughter Caleb Nunez last saw patient yesterday, he had no complaints, and has been doing well.  No history of seizures.  No headaches.   Clinical Impression   Pt supine in bed at start of OT/PT co-evaluation. Pt was able to complete sit to supine bed mobility with labored movement. Pt able to don socks at EOB with S/U assist to open socks container. Pt demonstrated need for min G assist for transfers and functional ambulation due to mild L lean. Improved to more of a SPV level once using single point cane in R UE. Pt presents with possible visual deficits but it is difficulty to assess at this time due to pt inconsistency with following commands. Pt demonstrates generalized B UE weakness. Pt will benefit from continued OT in the hospital and recommended venue below to increase strength, balance, and endurance for safe ADL's.       Follow Up Recommendations  Home health OT;Supervision - Intermittent    Equipment Recommendations  Tub/shower seat     Recommendations for Other Services Other (comment) (Pt may benefit from neuro vision specialist evaluation.)     Precautions / Restrictions Precautions Precautions: Fall Restrictions Weight Bearing Restrictions: No      Mobility Bed Mobility Overal bed mobility: Modified Independent             General bed mobility comments: Supine to sit with labored movement.    Transfers Overall transfer level: Needs assistance Equipment used: Straight cane Transfers: Sit to/from [employer] Transfers Sit to Stand: Supervision Stand pivot transfers: Supervision       General transfer comment: Without use of cane in R UE pt was at level of Min G assist during transfers, but with use of cane pt at more of a level of SPV.    Balance Overall balance assessment: Needs assistance Sitting-balance support: No upper extremity supported;Feet supported Sitting balance-Leahy Scale: Good Sitting balance - Comments: seated at EOB   Standing balance support: Single extremity supported;During functional activity Standing balance-Leahy Scale: Fair Standing balance comment: Improved balance with use of straight cane.                           ADL either performed or assessed with clinical judgement   ADL Overall ADL's : Needs assistance/impaired   Eating/Feeding Details (indicate cue type and reason): Mod I to S/U assist. Pt eventually able to open food container but may require assist for other more difficult containers.  Lower Body Dressing: Set up;Sitting/lateral leans Lower Body Dressing Details (indicate cue type and reason): s/u assist to open sock container but able to don socks without additional assist. Toilet Transfer: Supervision/safety;Ambulation Toilet Transfer Details (indicate cue type and reason): Simulated via pt ambulatory transfer using cane from bed to chair.                 Vision Baseline Vision/History: 1 Wears glasses  (Did not have his glasses today.) Ability to See in Adequate Light: 1 Impaired (Pt not wearing glasses.) Patient Visual Report: No change from baseline Vision Assessment?: Yes Tracking/Visual Pursuits: Other (comment) (Pt had difficulty tracking in all fields, pt difficulty following commands may contribute to this.) Saccades: Other (comment) (Pt often shifting focus or not fixating gaze on stimulus as directed. Cognitive deficits likely impacting evaluation.) Visual Fields: Other (comment) (Possible deficits as seen by pt not crossing out all shapes in R visual field, but also demonstrating possible L side neglect during ambualtion. Pt cognitive status makes visioin difficult to fully assess at this time.)                Pertinent Vitals/Pain Pain Assessment: No/denies pain     Hand Dominance Right   Extremity/Trunk Assessment Upper Extremity Assessment Upper Extremity Assessment: Generalized weakness (4/5 B MMT grossly with noted limited A/ROM to ~170* which is likely baseline range. No reports of numbness or tingling.)   Lower Extremity Assessment Lower Extremity Assessment: Defer to PT evaluation   Cervical / Trunk Assessment Cervical / Trunk Assessment: Normal   Communication Communication Communication: No difficulties   Cognition Arousal/Alertness: Awake/alert Behavior During Therapy: WFL for tasks assessed/performed Overall Cognitive Status: Impaired/Different from baseline Area of Impairment: Following commands                       Following Commands:  (Pt had difficulty following commands for visual assessment, but did well with commands for ambulation and most of UE assessment.)                            Home Living Family/patient expects to be discharged to:: Private residence (Independent living facility) Living Arrangements: Alone Available Help at Discharge: Family;Available PRN/intermittently;Other (Comment) (cleaning aids) Type of  Home: Independent living facility Home Access: Level entry     Home Layout: One level     Bathroom Shower/Tub: Occupational psychologist: Standard     Home Equipment:  (none)   Additional Comments: Pt's daughter present and assisting with home living report.  Lives With: Alone (Has meals brought to room.)    Prior Functioning/Environment Level of Independence: Needs assistance  Gait / Transfers Assistance Needed: Household ambulator without AD. ADL's / Homemaking Assistance Needed: Pt is independent for ADL's. Daughter reports that pt can cook for himself but he does have lunch delivered by the indpendent living facility.            OT Problem List: Decreased strength;Impaired balance (sitting and/or standing);Impaired vision/perception;Decreased cognition;Decreased safety awareness;Decreased knowledge of use of DME or AE      OT Treatment/Interventions: Self-care/ADL training;Therapeutic exercise;DME and/or AE instruction;Cognitive remediation/compensation;Visual/perceptual remediation/compensation;Patient/family education;Balance training    OT Goals(Current goals can be found in the care plan section) Acute Rehab OT Goals Patient Stated Goal: return to living facility OT Goal Formulation: With patient/family Time For Goal Achievement: 11/26/20 Potential to Achieve Goals: Good  OT Frequency: Min 2X/week  Co-evaluation PT/OT/SLP Co-Evaluation/Treatment: Yes Reason for Co-Treatment: To address functional/ADL transfers   OT goals addressed during session: Strengthening/ROM;ADL's and self-care                       End of Session Equipment Utilized During Treatment:  (cane) Nurse Communication: Mobility status  Activity Tolerance: Patient tolerated treatment well Patient left: in chair;with call bell/phone within reach;with chair alarm set;with family/visitor present  OT Visit Diagnosis: History of falling (Z91.81);Muscle weakness  (generalized) (M62.81);Unsteadiness on feet (R26.81);Other symptoms and signs involving cognitive function                Time: [id number] OT Time Calculation (min): 24 min Charges:  OT General Charges $OT Visit: 1 Visit OT Evaluation $OT Eval Low Complexity: 1 Low  Starlynn Klinkner OT, MOT  Larey Seat 11/12/2020, 9:53 AM

## 2020-11-12 NOTE — Progress Notes (Signed)
PROGRESS NOTE  Caleb Nunez [id number] DOB: [birth year] DOA: 11/10/2020 PCP: Orpah Melter, MD  Brief History:  87 y.o. male with medical history significant for metastatic prostate cancer, coronary artery disease, GI bleed from AVM, paroxysmal atrial fibrillation. [facility] of altered mental status. Daughter at bedside provided history, stating patient is functional ambulates with no assist with clear minded at baseline.   ED Course: Respiratory 21-25, afebrile temperature 98.7.  Blood pressure 130s to 160s.  WBC 14.8.  Creatinine 1.28.  CK elevated at 999.  Head CT without acute abnormality.  Chest x-ray unremarkable.  UA not suggestive of infection.  500 mill bolus given.   Assessment/Plan: Acute Ischemic Stroke -Appreciate Neurology Consult -PT/OT evaluation -Speech therapy eval -CT brain--no acute intracranial abnormality -MRI brain--Acute cortical infarcts L-frontal lobe with additional smaller infarcts L-parietal and R-frontoparietal lobes -MRA brain--Severe stenosis of a proximal left M2 MCA branch in the region of left frontal infarcts  -Carotid Duplex-- -Echo--EF 55-60%, no WMA, no PFO -LDL--106 -HbA1C-- -Antiplatelet--ASA+plavix x 90 days  Acute Metabolic encephalopathy -lethargic initially>>improved Answered 1 out of 3 questions correctly -CT of the head, UA, chest x-ray, unremarkable -On admission WBC 14.8 ..  No signs of sepsis, or overt infection, -PCT <0.10 -Lactate 1.2 -Currently afebrile normotensive   Rhabdomyolysis -CK  999 >> 995 -  Found down, unknown how long patient was on the floor. - Continue IVF -am CK   Metastatic prostate cancer to bone -Stable -diagnosed 2019, follows with hematologist Dr. Archie Balboa at [facility].  Per notes 08/2020, undetectable PSA: He is on regular Lupron injections and on denosumab. -follow up with urology 11/26/20   Paroxysmal atrial  fibrillation-currently sinus rhythm.   -Not on anticoagulation due to recurrent GI bleeds with multiple AVM and increased age.   GI bleed history-hemoglobin stable, will monitor closely   Coronary artery disease, first-degree AV block not new.   -History of stent placement 2019.   -no chest pain presently -Per cardiology note history of reduced ejection fraction, also mentioned that EF improved.   -Pending med reconciliation, hold Lasix, carvedilol. -troponin 21>>21 -Echo EF 55-60%, no WMA         Status is: Inpatient  Remains inpatient appropriate because:Inpatient level of care appropriate due to severity of illness  Dispo: The patient is from: ALF              Anticipated d/c is to: ALF              Patient currently is not medically stable to d/c.   Difficult to place patient No        Family Communication:   daughter updated at bedside 8/24  Consultants:  neurology  Code Status:  FULL  DVT Prophylaxis:  SSC Lovenox   Procedures: As Listed in Progress Note Above  Antibiotics: None      Subjective: Patient denies fevers, chills, headache, chest pain, dyspnea, nausea, vomiting, diarrhea, abdominal pain, dysuria, hematuria, hematochezia, and melena.   Objective: Vitals:   11/11/20 1655 11/11/20 1949 11/12/20 0427 11/12/20 1402  BP: (!) 175/75 (!) 181/72 (!) 166/77 (!) 162/76  Pulse: 66 67 60 62  Resp: '20 20 19 18  '$ Temp: (!) 97.5 F (36.4 C) 97.7 F (36.5 C) 98.1 F (36.7 C) 98.6 F (37 C)  TempSrc: Oral Oral  Oral  SpO2: 100% 99% 98% 98%  Weight:  Height:        Intake/Output Summary (Last 24 hours) at 11/12/2020 1804 Last data filed at 11/12/2020 1300 Gross per 24 hour  Intake 3278.38 ml  Output 1000 ml  Net 2278.38 ml   Weight change:  Exam:  General:  Pt is alert, follows commands appropriately, not in acute distress HEENT: No icterus, No thrush, No neck mass, [place]/AT Cardiovascular: RRR, S1/S2, no rubs, no  gallops Respiratory: bibasilar rales. No wheeze Abdomen: Soft/+BS, non tender, non distended, no guarding Extremities: No edema, No lymphangitis, No petechiae, No rashes, no synovitis   Data Reviewed: I have personally reviewed following labs and imaging studies Basic Metabolic Panel: Recent Labs  Lab 11/10/20 1920 11/11/20 0512  NA 136 136  K 3.9 3.9  CL 104 107  CO2 24 22  GLUCOSE 114* 90  BUN 26* 25*  CREATININE 1.28* 1.16  CALCIUM 8.7* 7.9*   Liver Function Tests: Recent Labs  Lab 11/10/20 1920  AST 44*  ALT 22  ALKPHOS 62  BILITOT 1.3*  PROT 7.1  ALBUMIN 3.7   No results for input(s): LIPASE, AMYLASE in the last 168 hours. No results for input(s): AMMONIA in the last 168 hours. Coagulation Profile: No results for input(s): INR, PROTIME in the last 168 hours. CBC: Recent Labs  Lab 11/10/20 1920 11/11/20 0512  WBC 14.8* 10.9*  NEUTROABS 13.0*  --   HGB 14.1 12.7*  HCT 41.8 37.8*  MCV 97.9 97.7  PLT 300 250   Cardiac Enzymes: Recent Labs  Lab 11/10/20 1920 11/11/20 0512  CKTOTAL 999* 995*   BNP: Invalid input(s): POCBNP CBG: No results for input(s): GLUCAP in the last 168 hours. HbA1C: No results for input(s): HGBA1C in the last 72 hours. Urine analysis:    Component Value Date/Time   COLORURINE YELLOW 11/10/2020 1905   APPEARANCEUR CLEAR 11/10/2020 1905   LABSPEC 1.020 11/10/2020 1905   PHURINE 6.0 11/10/2020 1905   GLUCOSEU NEGATIVE 11/10/2020 1905   HGBUR MODERATE (A) 11/10/2020 1905   BILIRUBINUR NEGATIVE 11/10/2020 1905   KETONESUR 20 (A) 11/10/2020 1905   PROTEINUR 100 (A) 11/10/2020 1905   NITRITE NEGATIVE 11/10/2020 1905   LEUKOCYTESUR NEGATIVE 11/10/2020 1905   Sepsis Labs: '@LABRCNTIP'$ (procalcitonin:4,lacticidven:4) ) Recent Results (from the past 240 hour(s))  Resp Panel by RT-PCR (Flu A&B, Covid) Nasopharyngeal Swab     Status: None   Collection Time: 11/10/20  7:09 PM   Specimen: Nasopharyngeal Swab; Nasopharyngeal(NP)  swabs in vial transport medium  Result Value Ref Range Status   SARS Coronavirus 2 by RT PCR NEGATIVE NEGATIVE Final    Comment: (NOTE) SARS-CoV-2 target nucleic acids are NOT DETECTED.  The SARS-CoV-2 RNA is generally detectable in upper respiratory specimens during the acute phase of infection. The lowest concentration of SARS-CoV-2 viral copies this assay can detect is 138 copies/mL. A negative result does not preclude SARS-Cov-2 infection and should not be used as the sole basis for treatment or other patient management decisions. A negative result may occur with  improper specimen collection/handling, submission of specimen other than nasopharyngeal swab, presence of viral mutation(s) within the areas targeted by this assay, and inadequate number of viral copies(<138 copies/mL). A negative result must be combined with clinical observations, patient history, and epidemiological information. The expected result is Negative.  Fact Sheet for Patients:  EntrepreneurPulse.com.au  Fact Sheet for Healthcare Providers:  IncredibleEmployment.be  This test is no t yet approved or cleared by the [place] FDA and  has been authorized for detection and/or  diagnosis of SARS-CoV-2 by FDA under an Emergency Use Authorization (EUA). This EUA will remain  in effect (meaning this test can be used) for the duration of the COVID-19 declaration under Section 564(b)(1) of the Act, 21 U.S.C.section 360bbb-3(b)(1), unless the authorization is terminated  or revoked sooner.       Influenza A by PCR NEGATIVE NEGATIVE Final   Influenza B by PCR NEGATIVE NEGATIVE Final    Comment: (NOTE) The Xpert Xpress SARS-CoV-2/FLU/RSV plus assay is intended as an aid in the diagnosis of influenza from Nasopharyngeal swab specimens and should not be used as a sole basis for treatment. Nasal washings and aspirates are unacceptable for Xpert Xpress  SARS-CoV-2/FLU/RSV testing.  Fact Sheet for Patients: EntrepreneurPulse.com.au  Fact Sheet for Healthcare Providers: IncredibleEmployment.be  This test is not yet approved or cleared by the [place] FDA and has been authorized for detection and/or diagnosis of SARS-CoV-2 by FDA under an Emergency Use Authorization (EUA). This EUA will remain in effect (meaning this test can be used) for the duration of the COVID-19 declaration under Section 564(b)(1) of the Act, 21 U.S.C. section 360bbb-3(b)(1), unless the authorization is terminated or revoked.  Performed at [facility], [address] [place], [place] [address]      Scheduled Meds:  aspirin EC  81 mg Oral Daily   atorvastatin  80 mg Oral Daily   [START ON 11/13/2020] clopidogrel  75 mg Oral Q breakfast   enoxaparin (LOVENOX) injection  40 mg Subcutaneous Q24H   Continuous Infusions:  lactated ringers 125 mL/hr at 11/12/20 0530    Procedures/Studies: CT Head Wo Contrast  Result Date: 11/10/2020 CLINICAL DATA:  Altered mental status EXAM: CT HEAD WITHOUT CONTRAST TECHNIQUE: Contiguous axial images were obtained from the base of the skull through the vertex without intravenous contrast. COMPARISON:  None. FINDINGS: Brain: Diffuse low-density throughout the deep white matter, likely chronic small vessel disease. No acute intracranial abnormality. Specifically, no hemorrhage, hydrocephalus, mass lesion, acute infarction, or significant intracranial injury. Vascular: No hyperdense vessel or unexpected calcification. Skull: No acute calvarial abnormality. Sinuses/Orbits: Air-fluid levels in the left sphenoid sinus and bilateral maxillary sinuses. Other: None IMPRESSION: Chronic small vessel disease. No acute intracranial abnormality. Acute sinusitis. Electronically Signed   By: Rolm Baptise M.D.   On: 11/10/2020 20:12   CT Cervical Spine Wo Contrast  Result Date: 11/10/2020 CLINICAL DATA:   Unwitnessed fall EXAM: CT CERVICAL SPINE WITHOUT CONTRAST TECHNIQUE: Multidetector CT imaging of the cervical spine was performed without intravenous contrast. Multiplanar CT image reconstructions were also generated. COMPARISON:  None. FINDINGS: Alignment: No subluxation Skull base and vertebrae: No fracture. There are multiple sclerotic lesions throughout the cervical spine. Soft tissues and spinal canal: No prevertebral fluid or swelling. No visible canal hematoma. Disc levels:  Maintained.  Degenerative facet disease. Upper chest: Sclerotic lesions in the upper ribs. Other: None IMPRESSION: No evidence of fracture. 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 Electronically Signed   By: Rolm Baptise M.D.   On: 11/10/2020 20:14   MR ANGIO HEAD WO CONTRAST  Result Date: 11/11/2020 CLINICAL DATA:  Delirium EXAM: MRI HEAD WITHOUT CONTRAST MRA HEAD WITHOUT CONTRAST TECHNIQUE: Multiplanar, multi-echo pulse sequences of the brain and surrounding structures were acquired without intravenous contrast. Angiographic images of the Circle of Willis were acquired using MRA technique without intravenous contrast. COMPARISON:  CT head 11/10/2020. FINDINGS: MRI HEAD FINDINGS Brain: Multiple cortical and subcortical acute infarcts in the left frontal lobe  and anterior left insula, confluent in areas. Multiple smaller acute infarcts in the left parietal cortex, and right frontoparietal white matter (annotated on series 5). Mild associated edema without mass effect. No midline shift. Additional moderate patchy T2 hyperintensities within the white matter, compatible with chronic microvascular ischemic disease. Mild atrophy. No evidence of acute hemorrhage, hydrocephalus, mass lesion, extra-axial fluid collection, or hydrocephalus. Vascular: See below. Skull and upper cervical spine: Multiple sclerotic lesions in the partially imaged cervical spine, concerning  metastases. Sinuses/Orbits: Moderate mucosal thickening of the ethmoid air cells, maxillary sinuses, and left sphenoid sinus with air-fluid levels in the maxillary and sphenoid sinuses. Other: Trace bilateral mastoid effusions. MRA HEAD FINDINGS Anterior circulation: Bilateral intracranial ICAs and M1 MCAs are patent without hemodynamically significant stenosis. Severe stenosis of a proximal left M2 MCA branch. Multifocal mild-to-moderate stenosis of multiple additional MCA branches bilaterally. Poor visualization of the ACAs with suspected severe left and moderate right A2 ACA stenosis. Right opthalmic artery infundibulum. Approximately 2-3 mm outpouching arising from the left paraclinoid ICA anteriorly. Posterior circulation: Small left intradural vertebral artery and dominant right intradural vertebral artery. Basilar artery bilateral posterior cerebral arteries are without proximal hemodynamically significant stenosis. Multifocal mild-to-moderate narrowing of the PCAs with for evaluation distally. Approximately 2 mm anteriorly directed outpouching arising from the proximal right PCA (series 100, image 108), concerning for aneurysm. IMPRESSION: MRI: 1. Acute cortical infarcts in the left frontal lobe and insula with additional smaller acute infarcts in the left parietal and right frontoparietal lobes. Mild associated edema without mass effect. Given involvement of multiple vascular territories, consider an embolic etiology. 2. Moderate chronic microvascular ischemic disease. 3. Multiple sclerotic lesions in the partially imaged cervical spine, concerning for metastases. Postcontrast imaging of head could provide more sensitive evaluation for intracranial metastatic disease if clinically indicated. 4. Moderate paranasal sinus disease with air-fluid levels, detailed above. MRA: 1. Severe stenosis of a proximal left M2 MCA branch in the region of left frontal infarcts described above. Multifocal mild-to-moderate  stenosis of multiple additional MCA branches bilaterally with poor evaluation distally. 2. Poor visualization of the ACAs with suspected severe left and moderate right A2 ACA stenosis. 3. Approximately 2 mm anteriorly directed outpouching arising from the proximal right PCA, concerning for aneurysm. 4. Approximately 2-3 mm outpouching arising from the left paraclinoid ICA anteriorly, suggestive of aneurysm versus opthalmic artery infundibulum with vessel too small to see by MRA. Additional outpouching arising from the right paraclinoid ICA is favored to be an infunidbulum. 5. A CTA could better characterize the above findings if clinically indicated. These results will be called to the ordering clinician or representative by the Radiologist Assistant, and communication documented in the PACS or [employer]. Electronically Signed   By: Margaretha Sheffield M.D.   On: 11/11/2020 17:30   MR BRAIN WO CONTRAST  Result Date: 11/11/2020 CLINICAL DATA:  Delirium EXAM: MRI HEAD WITHOUT CONTRAST MRA HEAD WITHOUT CONTRAST TECHNIQUE: Multiplanar, multi-echo pulse sequences of the brain and surrounding structures were acquired without intravenous contrast. Angiographic images of the Circle of Willis were acquired using MRA technique without intravenous contrast. COMPARISON:  CT head 11/10/2020. FINDINGS: MRI HEAD FINDINGS Brain: Multiple cortical and subcortical acute infarcts in the left frontal lobe and anterior left insula, confluent in areas. Multiple smaller acute infarcts in the left parietal cortex, and right frontoparietal white matter (annotated on series 5). Mild associated edema without mass effect. No midline shift. Additional moderate patchy T2 hyperintensities within the white matter, compatible with chronic microvascular ischemic disease.  Mild atrophy. No evidence of acute hemorrhage, hydrocephalus, mass lesion, extra-axial fluid collection, or hydrocephalus. Vascular: See below. Skull and upper cervical  spine: Multiple sclerotic lesions in the partially imaged cervical spine, concerning metastases. Sinuses/Orbits: Moderate mucosal thickening of the ethmoid air cells, maxillary sinuses, and left sphenoid sinus with air-fluid levels in the maxillary and sphenoid sinuses. Other: Trace bilateral mastoid effusions. MRA HEAD FINDINGS Anterior circulation: Bilateral intracranial ICAs and M1 MCAs are patent without hemodynamically significant stenosis. Severe stenosis of a proximal left M2 MCA branch. Multifocal mild-to-moderate stenosis of multiple additional MCA branches bilaterally. Poor visualization of the ACAs with suspected severe left and moderate right A2 ACA stenosis. Right opthalmic artery infundibulum. Approximately 2-3 mm outpouching arising from the left paraclinoid ICA anteriorly. Posterior circulation: Small left intradural vertebral artery and dominant right intradural vertebral artery. Basilar artery bilateral posterior cerebral arteries are without proximal hemodynamically significant stenosis. Multifocal mild-to-moderate narrowing of the PCAs with for evaluation distally. Approximately 2 mm anteriorly directed outpouching arising from the proximal right PCA (series 100, image 108), concerning for aneurysm. IMPRESSION: MRI: 1. Acute cortical infarcts in the left frontal lobe and insula with additional smaller acute infarcts in the left parietal and right frontoparietal lobes. Mild associated edema without mass effect. Given involvement of multiple vascular territories, consider an embolic etiology. 2. Moderate chronic microvascular ischemic disease. 3. Multiple sclerotic lesions in the partially imaged cervical spine, concerning for metastases. Postcontrast imaging of head could provide more sensitive evaluation for intracranial metastatic disease if clinically indicated. 4. Moderate paranasal sinus disease with air-fluid levels, detailed above. MRA: 1. Severe stenosis of a proximal left M2 MCA branch in  the region of left frontal infarcts described above. Multifocal mild-to-moderate stenosis of multiple additional MCA branches bilaterally with poor evaluation distally. 2. Poor visualization of the ACAs with suspected severe left and moderate right A2 ACA stenosis. 3. Approximately 2 mm anteriorly directed outpouching arising from the proximal right PCA, concerning for aneurysm. 4. Approximately 2-3 mm outpouching arising from the left paraclinoid ICA anteriorly, suggestive of aneurysm versus opthalmic artery infundibulum with vessel too small to see by MRA. Additional outpouching arising from the right paraclinoid ICA is favored to be an infunidbulum. 5. A CTA could better characterize the above findings if clinically indicated. These results will be called to the ordering clinician or representative by the Radiologist Assistant, and communication documented in the PACS or [employer]. Electronically Signed   By: Margaretha Sheffield M.D.   On: 11/11/2020 17:30   DG Chest Port 1 View  Result Date: 11/10/2020 CLINICAL DATA:  Altered mental status.  Unwitnessed fall. EXAM: PORTABLE CHEST 1 VIEW COMPARISON:  None. FINDINGS: Shallow inspiration. Heart size and pulmonary vascularity are normal for inspiratory effort. There is a mild interstitial pattern seen to the lungs with some peribronchial thickening. This is likely chronic bronchitis although early multifocal pneumonia could have this appearance in the appropriate clinical setting. No focal consolidation. No pleural effusions. No pneumothorax. Mediastinal contours appear intact. Calcification of the aorta. Visualized bones are nondepressed. IMPRESSION: Chronic bronchitic changes in the lungs. No active pulmonary disease. Electronically Signed   By: Lucienne Capers M.D.   On: 11/10/2020 19:21   ECHOCARDIOGRAM COMPLETE  Result Date: 11/12/2020    ECHOCARDIOGRAM REPORT   Patient Name:   KEANTE TROJANOWSKI Date of Exam: 11/12/2020 Medical Rec #:  [id number]    Height:       69.0 in Accession #:    [id number]  Weight:  140.0 lb Date of Birth:  1927/04/19    BSA:          1.775 m Patient Age:    22 years    BP:           166/77 mmHg Patient Gender: M           HR:           65 bpm. Exam Location:  [facility] Procedure: 2D Echo, Cardiac Doppler and Color Doppler Indications:    Stroke  History:        Patient has no prior history of Echocardiogram examinations.                 Stroke; Arrythmias:Atrial Fibrillation. Altered Mental status.  Sonographer:    Wenda Low Referring Phys: [id number] SEYED A SHAHMEHDI IMPRESSIONS  1. Left ventricular ejection fraction, by estimation, is 55 to 60%. The left ventricle has normal function. The left ventricle has no regional wall motion abnormalities. There is mild left ventricular hypertrophy. Left ventricular diastolic parameters are indeterminate.  2. Right ventricular systolic function is normal. The right ventricular size is normal. There is normal pulmonary artery systolic pressure.  3. The mitral valve is normal in structure. Mild mitral valve regurgitation. No evidence of mitral stenosis.  4. The aortic valve is tricuspid. There is mild calcification of the aortic valve. There is mild thickening of the aortic valve. Aortic valve regurgitation is mild.  5. The inferior vena cava is normal in size with greater than 50% respiratory variability, suggesting right atrial pressure of 3 mmHg. FINDINGS  Left Ventricle: Left ventricular ejection fraction, by estimation, is 55 to 60%. The left ventricle has normal function. The left ventricle has no regional wall motion abnormalities. The left ventricular internal cavity size was normal in size. There is  mild left ventricular hypertrophy. Left ventricular diastolic parameters are indeterminate. Right Ventricle: The right ventricular size is normal. No increase in right ventricular wall thickness. Right ventricular systolic function is normal. There is normal pulmonary artery  systolic pressure. The tricuspid regurgitant velocity is 2.43 m/s, and  with an assumed right atrial pressure of 8 mmHg, the estimated right ventricular systolic pressure is [id number] mmHg. Left Atrium: Left atrial size was normal in size. Right Atrium: Right atrial size was normal in size. Pericardium: There is no evidence of pericardial effusion. Mitral Valve: The mitral valve is normal in structure. There is mild thickening of the mitral valve leaflet(s). There is mild calcification of the mitral valve leaflet(s). Mild mitral annular calcification. Mild mitral valve regurgitation. No evidence of  mitral valve stenosis. MV peak gradient, 4.5 mmHg. The mean mitral valve gradient is 2.0 mmHg. Tricuspid Valve: The tricuspid valve is normal in structure. Tricuspid valve regurgitation is mild . No evidence of tricuspid stenosis. Aortic Valve: The aortic valve is tricuspid. There is mild calcification of the aortic valve. There is mild thickening of the aortic valve. There is mild aortic valve annular calcification. Aortic valve regurgitation is mild. Aortic valve mean gradient measures 6.0 mmHg. Aortic valve peak gradient measures 10.8 mmHg. Aortic valve area, by VTI measures 1.90 cm. Pulmonic Valve: The pulmonic valve was not well visualized. Pulmonic valve regurgitation is not visualized. No evidence of pulmonic stenosis. Aorta: The aortic root is normal in size and structure. Venous: The inferior vena cava is normal in size with greater than 50% respiratory variability, suggesting right atrial pressure of 3 mmHg. IAS/Shunts: No atrial level shunt detected by color flow Doppler.  LEFT VENTRICLE PLAX 2D LVIDd:         5.28 cm  Diastology LVIDs:         3.33 cm  LV e' medial:    6.22 cm/s LV PW:         1.03 cm  LV E/e' medial:  13.6 LV IVS:        1.10 cm  LV e' lateral:   7.45 cm/s LVOT diam:     2.00 cm  LV E/e' lateral: 11.4 LV SV:         76 LV SV Index:   43 LVOT Area:     3.14 cm  RIGHT VENTRICLE RV Basal diam:   2.99 cm RV Mid diam:    2.83 cm RV S prime:     13.20 cm/s TAPSE (M-mode): 2.0 cm LEFT ATRIUM             Index       RIGHT ATRIUM           Index LA diam:        4.30 cm 2.42 cm/m  RA Area:     15.20 cm LA Vol (A2C):   51.4 ml 28.95 ml/m RA Volume:   35.40 ml  19.94 ml/m LA Vol (A4C):   58.6 ml 33.01 ml/m LA Biplane Vol: 54.9 ml 30.92 ml/m  AORTIC VALVE AV Area (Vmax):    1.90 cm AV Area (Vmean):   1.75 cm AV Area (VTI):     1.90 cm AV Vmax:           164.00 cm/s AV Vmean:          114.000 cm/s AV VTI:            0.399 m AV Peak Grad:      10.8 mmHg AV Mean Grad:      6.0 mmHg LVOT Vmax:         99.20 cm/s LVOT Vmean:        63.600 cm/s LVOT VTI:          0.241 m LVOT/AV VTI ratio: 0.60  AORTA Ao Root diam: 3.10 cm MITRAL VALVE               TRICUSPID VALVE MV Area (PHT): 3.24 cm    TR Peak grad:   23.6 mmHg MV Area VTI:   2.05 cm    TR Vmax:        243.00 cm/s MV Peak grad:  4.5 mmHg MV Mean grad:  2.0 mmHg    SHUNTS MV Vmax:       1.06 m/s    Systemic VTI:  0.24 m MV Vmean:      57.8 cm/s   Systemic Diam: 2.00 cm MV Decel Time: 234 msec MV E velocity: 84.80 cm/s MV A velocity: 90.40 cm/s MV E/A ratio:  0.94 Carlyle Dolly MD Electronically signed by Carlyle Dolly MD Signature Date/Time: 11/12/2020/3:57:08 PM    Final     Orson Eva, DO  [facility]  If 7PM-7AM, please contact night-coverage [email or web address] Password [email or web address] 11/12/2020, 6:04 PM   LOS: 1 day

## 2020-11-12 NOTE — Care Management Important Message (Signed)
Important Message  Patient Details  Name: Caleb Nunez MRN: [id number] Date of Birth: [birth year]   Medicare Important Message Given:  Yes     Tommy Medal 11/12/2020, 3:52 PM

## 2020-11-13 ENCOUNTER — Inpatient Hospital Stay (HOSPITAL_COMMUNITY): Payer: Medicare Other

## 2020-11-13 DIAGNOSIS — I639 Cerebral infarction, unspecified: Principal | ICD-10-CM

## 2020-11-13 LAB — HEMOGLOBIN A1C
Hgb A1c MFr Bld: 5.2 % (ref 4.8–5.6)
Mean Plasma Glucose: 102.54 mg/dL

## 2020-11-13 LAB — BASIC METABOLIC PANEL
Anion gap: 8 (ref 5–15)
BUN: 22 mg/dL (ref 8–23)
CO2: 26 mmol/L (ref 22–32)
Calcium: 8.1 mg/dL — ABNORMAL LOW (ref 8.9–10.3)
Chloride: 103 mmol/L (ref 98–111)
Creatinine, Ser: 1.23 mg/dL (ref 0.61–1.24)
GFR, Estimated: 55 mL/min — ABNORMAL LOW (ref >60)
Glucose, Bld: 100 mg/dL — ABNORMAL HIGH (ref 70–99)
Potassium: 3.7 mmol/L (ref 3.5–5.1)
Sodium: 137 mmol/L (ref 135–145)

## 2020-11-13 LAB — CK: Total CK: 297 U/L (ref 49–397)

## 2020-11-13 LAB — VITAMIN B12: Vitamin B-12: 2950 pg/mL — ABNORMAL HIGH (ref 180–914)

## 2020-11-13 LAB — FOLATE: Folate: 4.1 ng/mL — ABNORMAL LOW (ref >5.9)

## 2020-11-13 IMAGING — US US CAROTID DUPLEX BILAT
1 series · 14 of 24 positions shown · non-contrast
Comparison: None.

CLINICAL DATA: Altered mental status

Prostate cancer
EXAM:
BILATERAL CAROTID DUPLEX ULTRASOUND
TECHNIQUE: Gray scale imaging, color Doppler and duplex ultrasound were
performed of bilateral carotid and vertebral arteries in the neck.

[Series 1: us carotid bilateral · 14 of 68 slices shown]
[im 1/68]
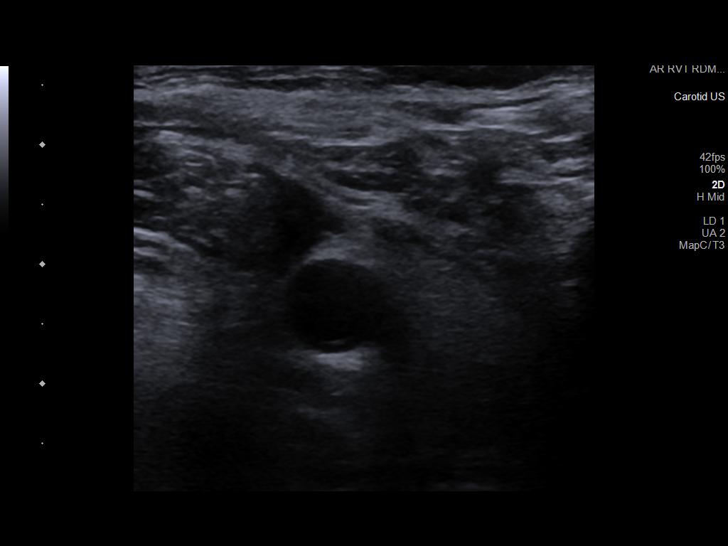
[im 6/68]
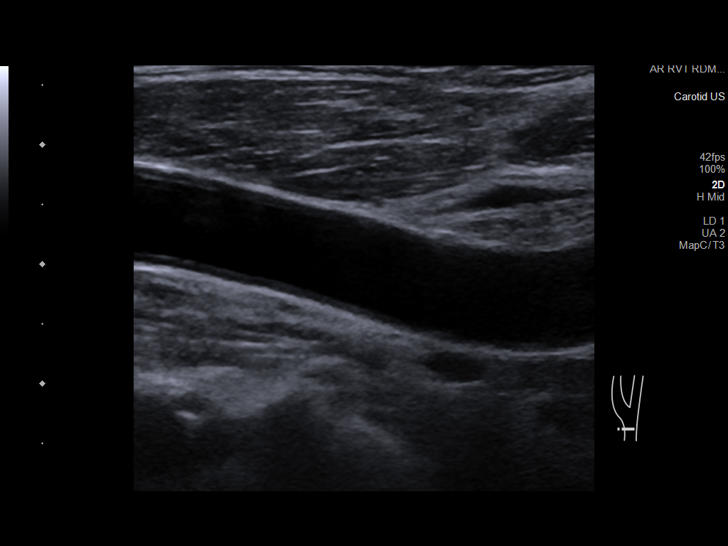
[im 12/68]
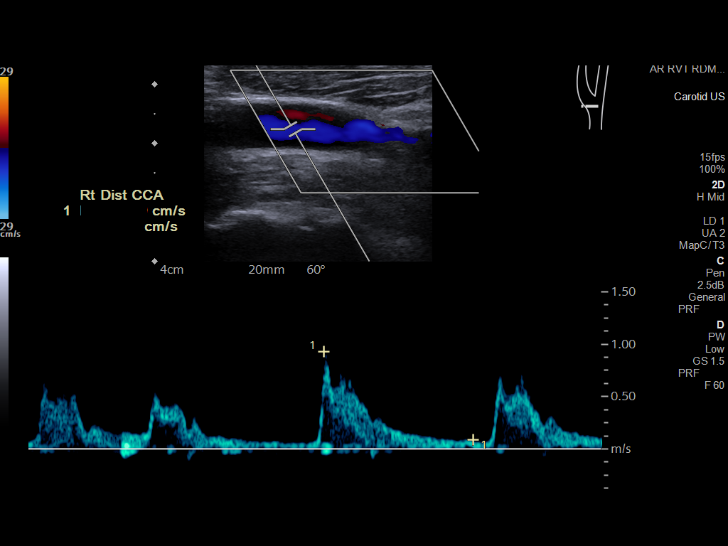
[im 18/68]
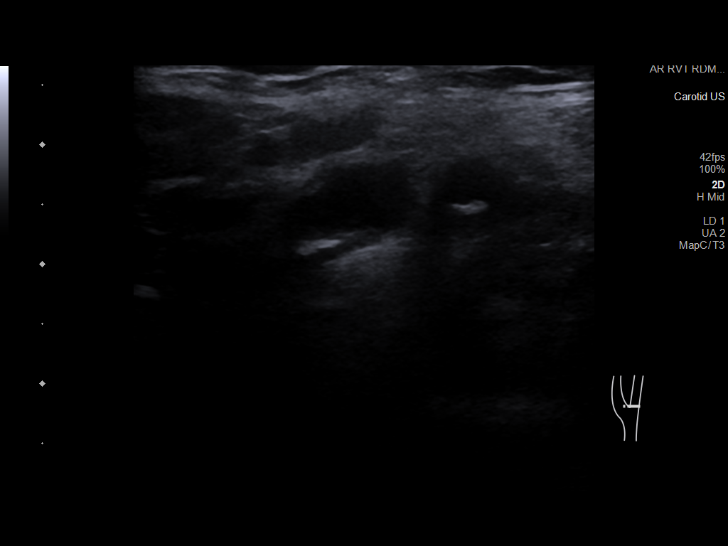
[im 21/68]
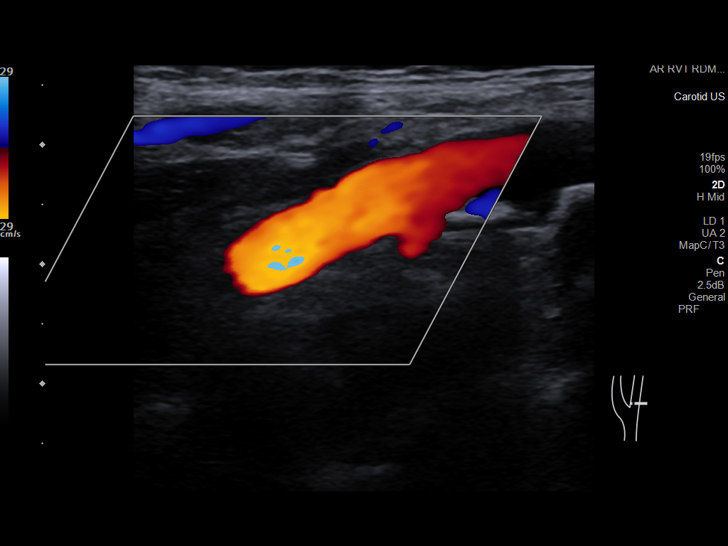
[im 27/68]
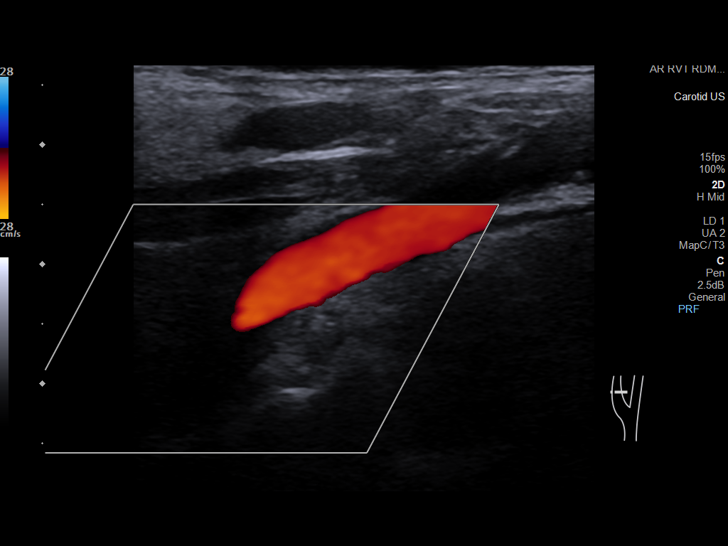
[im 33/68]
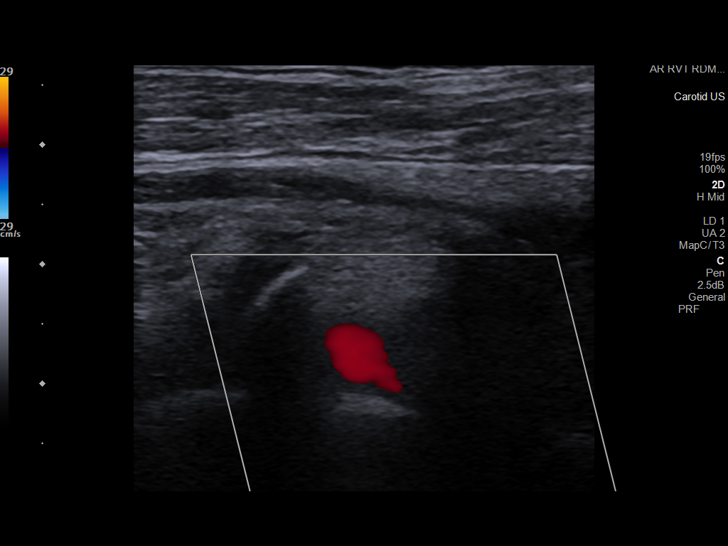
[im 35/68]
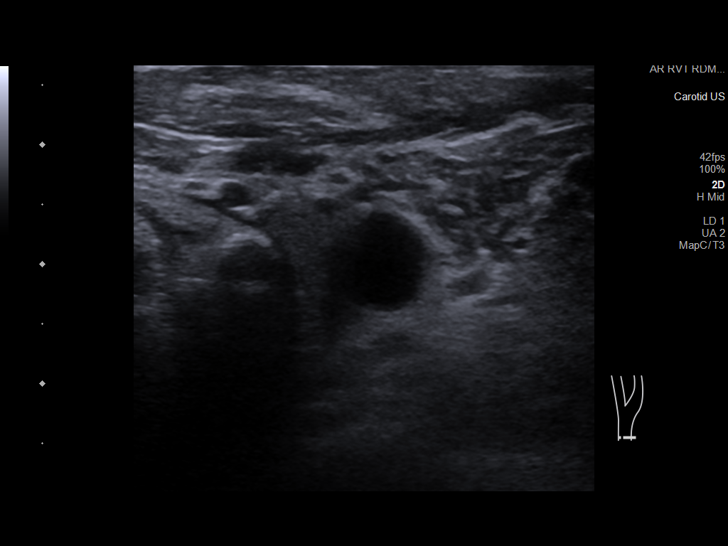
[im 41/68]
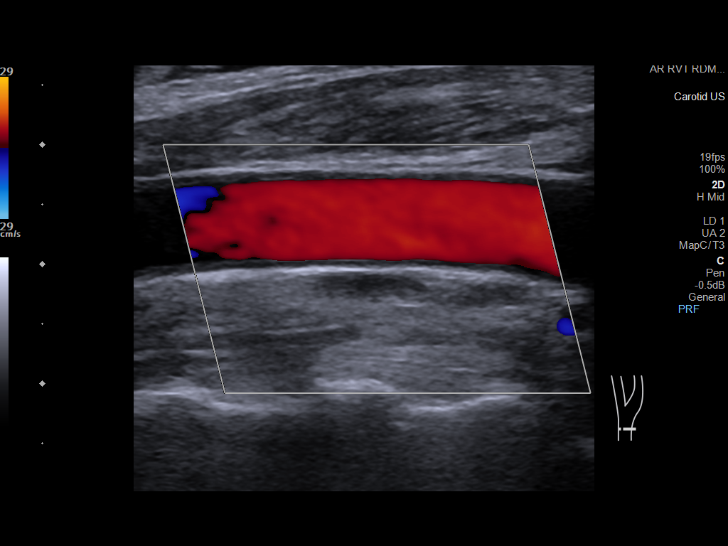
[im 47/68]
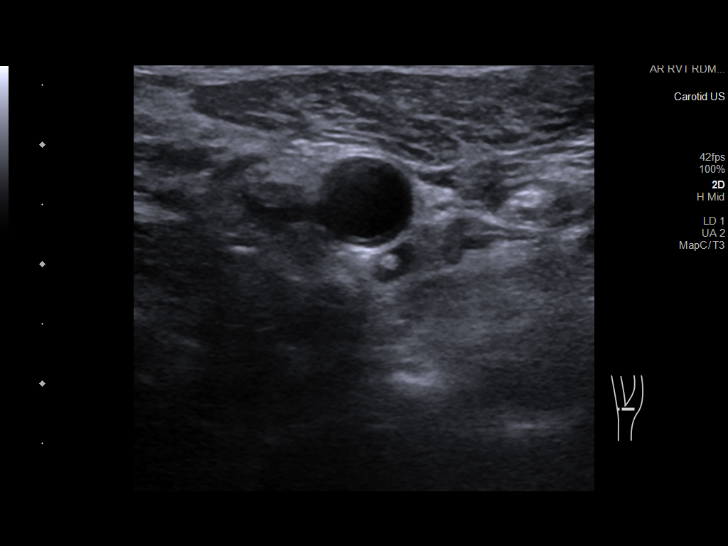
[im 53/68]
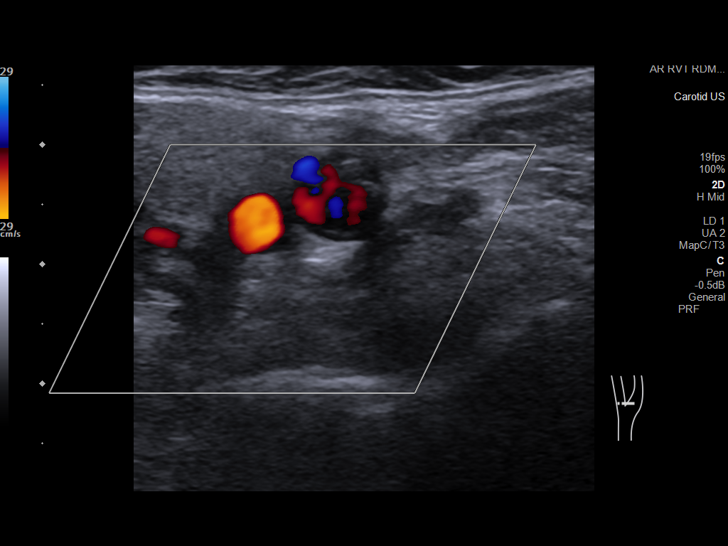
[im 56/68]
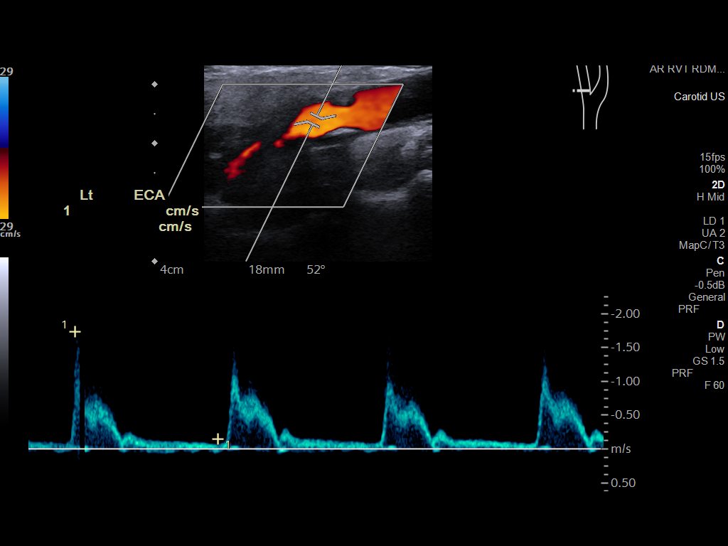
[im 62/68]
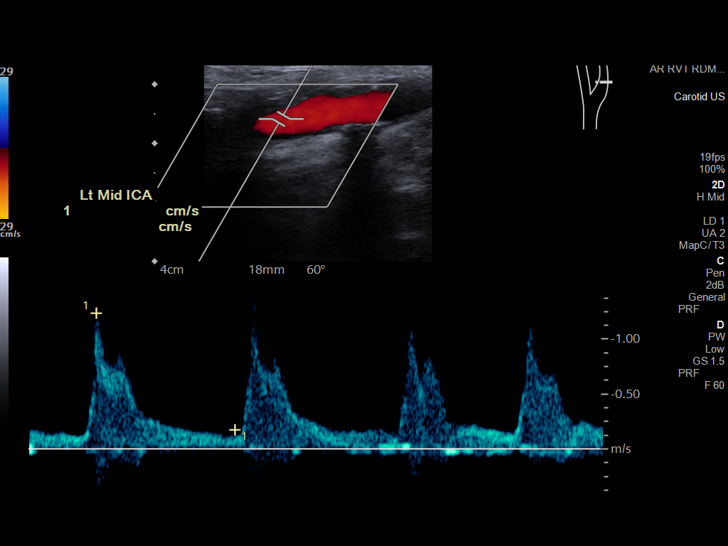
[im 68/68]
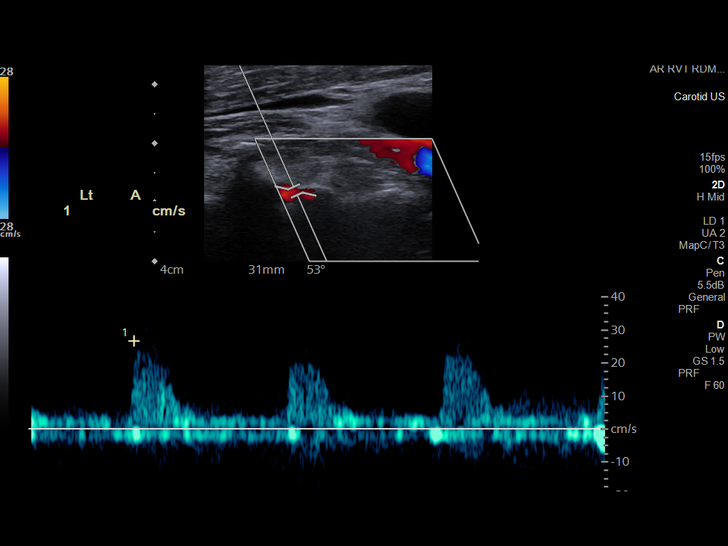

[14 of 24 positions shown; findings below may reference images not displayed]

FINDINGS: Criteria: Quantification of carotid stenosis is based on velocity
parameters that correlate the residual internal carotid diameter
with NASCET-based stenosis levels, using the diameter of the distal
internal carotid lumen as the denominator for stenosis measurement.

The following velocity measurements were obtained:

RIGHT

ICA: 63/13 cm/sec

CCA: 71/8 cm/sec

SYSTOLIC ICA/CCA RATIO:

ECA: 75 cm/sec

LEFT

ICA: 136/10 cm/sec

CCA: 82/7 cm/sec

SYSTOLIC ICA/CCA RATIO:

ECA: 173 cm/sec

RIGHT CAROTID ARTERY: Mild atheromatous plaque of the internal
carotid artery origin.

RIGHT VERTEBRAL ARTERY:  Antegrade flow.

LEFT CAROTID ARTERY: Mild heterogeneous plaque of the carotid
bifurcation.

LEFT VERTEBRAL ARTERY:  Antegrade flow.
IMPRESSION: 1. 50-69% stenosis of the left internal carotid artery.
2. Less than 50% stenosis of the right internal carotid artery.

## 2020-11-13 MED ORDER — CLOPIDOGREL BISULFATE 75 MG PO TABS: 75.0000 mg | ORAL_TABLET | Freq: Every day | ORAL | 2 refills | Status: AC

## 2020-11-13 MED ORDER — FOLIC ACID 1 MG PO TABS
1.0000 mg | ORAL_TABLET | Freq: Every day | ORAL | Status: DC
  Administered 2020-11-13: 1 mg via ORAL
  Filled 2020-11-13: qty 1

## 2020-11-13 MED ORDER — ROSUVASTATIN CALCIUM 40 MG PO TABS: 40.0000 mg | ORAL_TABLET | Freq: Every day | ORAL | 2 refills | Status: AC

## 2020-11-13 MED ORDER — ASPIRIN 81 MG PO TBEC: 81.0000 mg | DELAYED_RELEASE_TABLET | Freq: Every day | ORAL | 11 refills | Status: DC

## 2020-11-13 MED ORDER — ASPIRIN 81 MG PO TBEC: 81.0000 mg | DELAYED_RELEASE_TABLET | Freq: Every day | ORAL | 2 refills | Status: AC

## 2020-11-13 MED ORDER — AMLODIPINE BESYLATE 5 MG PO TABS: 5.0000 mg | ORAL_TABLET | Freq: Every day | ORAL | 2 refills | Status: AC

## 2020-11-13 NOTE — Progress Notes (Signed)
Palliative: Caleb Nunez is lying quietly in bed.  He greets me making but not keeping eye contact.  He appears acutely/chronically ill and somewhat frail.  He answers my questions about his name, but does not answer questions about where we are.  His daughter/HC POA, Tawanna Cooler, is at bedside.  We talked about discharge.  She shares that Caleb Nunez is to return to [facility] where he lives in independent living.  We talked about home health services, and I greatly encouraged her to work with speech therapy.  We also talked about working closely with the supervisor onsite at [facility] for further needs or concerns.  Pam shares that she has been in contact with her workers at her father's ILF.  We talked about outpatient palliative services for continued support and goals of care discussions.  Pam is agreeable to outpatient palliative services, requesting [facility] for all services.  Conference with Education officer, museum.  Conference with attending, bedside nursing staff, transition of care team related to patient condition, needs, goals of care, disposition.  Plan: [facility] to [facility] with home health/PT/outpatient palliative services through [facility].  49 minutes Quinn Axe, NP Palliative medicine team Team phone [phone] Greater than 50% of this time was spent counseling and coordinating care related to the above assessment and plan.

## 2020-11-13 NOTE — Discharge Summary (Addendum)
Physician Discharge Summary  Caleb Nunez [id number] DOB: Jun 30, 1927 DOA: 11/10/2020  PCP: Orpah Melter, MD  Admit date: 11/10/2020 Discharge date: 11/13/2020  Admitted From: [facility] Disposition:  [facility]  Recommendations for Outpatient Follow-up:  Follow up with PCP in 1-2 weeks Please obtain BMP/CBC in one week   Home Health: [facility] Equipment/Devices: Cane, walker  Discharge Condition: Stable CODE STATUS:FULL Diet recommendation: Heart Healthy    Brief/Interim Summary: 87 y.o. male with medical history significant for metastatic prostate cancer, coronary artery disease, GI bleed from AVM, paroxysmal atrial fibrillation. [facility] reports of altered mental status. Daughter at bedside provided history, stating patient is functional ambulates with no assist with clear minded at baseline.   ED Course: Respiratory 21-25, afebrile temperature 98.7.  Blood pressure 130s to 160s.  WBC 14.8.  Creatinine 1.28.  CK elevated at 999.  Head CT without acute abnormality.  Chest x-ray unremarkable.  UA not suggestive of infection.  500 mill bolus given.     Discharge Diagnoses:  Acute Ischemic Stroke -Appreciate Neurology Consult -PT/OT evaluation -Speech therapy eval -CT brain--no acute intracranial abnormality -MRI brain--Acute cortical infarcts L-frontal lobe with additional smaller infarcts L-parietal and R-frontoparietal lobes -MRA brain--Severe stenosis of a proximal left M2 MCA branch in the region of left frontal infarcts  -Carotid Duplex--50-69% on left, <50% on right -Echo--EF 55-60%, no WMA, no PFO -LDL--106 -HbA1C--pending at time of dc -Antiplatelet--ASA+plavix x 90 days, then plavix monotherapy thereafter -repeat carotid duplex in 6-12 months -I have reached out to cardiology to help set up 30 day monitor after pt is discharged   Acute Metabolic  encephalopathy -lethargic initially>>improved -following one step commands -CT of the head, UA, chest x-ray, unremarkable -On admission WBC 14.8 ..  No signs of sepsis, or overt infection, -PCT <0.10 -Lactate 1.2   Rhabdomyolysis -CK  999 >> 995>>297 -  Found down, unknown how long patient was on the floor. - Continue IVF   Metastatic prostate cancer to bone -Stable -diagnosed 2019, follows with hematologist Dr. Archie Balboa at [facility].  Per notes 08/2020, undetectable PSA: He is on regular Lupron injections and on denosumab. -follow up with urology 11/26/20   Paroxysmal atrial fibrillation-currently sinus rhythm.   -Not on anticoagulation due to recurrent GI bleeds with multiple AVM and increased age. -daughter stated pt previously on warfarin -she did not want him to continue on [facility] due to bleeding risk   GI bleed history-hemoglobin stable, will monitor closely   Coronary artery disease, first-degree AV block not new.   -History of stent placement 2019.   -no chest pain presently -Per cardiology note history of reduced ejection fraction, also mentioned that EF improved.   -Pending med reconciliation, hold Lasix, carvedilol. -troponin 21>>21 -Echo EF 55-60%, no WMA  Hyperlipidemia -d/c lipitor -start crestor 40 mg  Hypertension -allowing permissive hypertension initially -start amlodipine 5 my at time of d/c   Discharge Instructions   Allergies as of 11/13/2020   No Known Allergies      Medication List     STOP taking these medications    atorvastatin 80 MG tablet Commonly known as: LIPITOR   carvedilol 3.125 MG tablet Commonly known as: COREG   furosemide 40 MG tablet Commonly known as: LASIX   lisinopril 5 MG tablet Commonly known as: ZESTRIL       TAKE these medications    amiodarone 100 MG tablet Commonly known as: PACERONE Take 100 mg by  mouth daily.   amLODipine 5 MG tablet Commonly known as: NORVASC Take 1 tablet (5 mg total) by  mouth daily.   aspirin 81 MG EC tablet Take 1 tablet (81 mg total) by mouth daily. Swallow whole. X 90 days   calcium citrate-vitamin D 315-200 MG-UNIT tablet Commonly known as: CITRACAL+D Take by mouth.   clopidogrel 75 MG tablet Commonly known as: PLAVIX Take 1 tablet (75 mg total) by mouth daily with breakfast. Start taking on: November 14, 2020   [employer] Vitamin B12 1000 MCG tablet Generic drug: cyanocobalamin Take 1,000 mcg by mouth daily.   ferrous sulfate 325 (65 FE) MG EC tablet Take by mouth.   leuprolide 22.5 MG injection Commonly known as: LUPRON Inject into the muscle.   rosuvastatin 40 MG tablet Commonly known as: Crestor Take 1 tablet (40 mg total) by mouth daily.               Durable Medical Equipment  (From admission, onward)           Start     Ordered   11/13/20 1109  For home use only DME Cane  Once        11/13/20 1108   11/13/20 1109  For home use only DME Walker  Once       Question:  Patient needs a walker to treat with the following condition  Answer:  Acute ischemic stroke ([place])   11/13/20 1108            No Known Allergies  Consultations: neurology   Procedures/Studies: CT Head Wo Contrast  Result Date: 11/10/2020 CLINICAL DATA:  Altered mental status EXAM: CT HEAD WITHOUT CONTRAST TECHNIQUE: Contiguous axial images were obtained from the base of the skull through the vertex without intravenous contrast. COMPARISON:  None. FINDINGS: Brain: Diffuse low-density throughout the deep white matter, likely chronic small vessel disease. No acute intracranial abnormality. Specifically, no hemorrhage, hydrocephalus, mass lesion, acute infarction, or significant intracranial injury. Vascular: No hyperdense vessel or unexpected calcification. Skull: No acute calvarial abnormality. Sinuses/Orbits: Air-fluid levels in the left sphenoid sinus and bilateral maxillary sinuses. Other: None IMPRESSION: Chronic small vessel disease. No acute  intracranial abnormality. Acute sinusitis. Electronically Signed   By: Rolm Baptise M.D.   On: 11/10/2020 20:12   CT Cervical Spine Wo Contrast  Result Date: 11/10/2020 CLINICAL DATA:  Unwitnessed fall EXAM: CT CERVICAL SPINE WITHOUT CONTRAST TECHNIQUE: Multidetector CT imaging of the cervical spine was performed without intravenous contrast. Multiplanar CT image reconstructions were also generated. COMPARISON:  None. FINDINGS: Alignment: No subluxation Skull base and vertebrae: No fracture. There are multiple sclerotic lesions throughout the cervical spine. Soft tissues and spinal canal: No prevertebral fluid or swelling. No visible canal hematoma. Disc levels:  Maintained.  Degenerative facet disease. Upper chest: Sclerotic lesions in the upper ribs. Other: None IMPRESSION: No evidence of fracture. 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 Electronically Signed   By: Rolm Baptise M.D.   On: 11/10/2020 20:14   MR ANGIO HEAD WO CONTRAST  Result Date: 11/11/2020 CLINICAL DATA:  Delirium EXAM: MRI HEAD WITHOUT CONTRAST MRA HEAD WITHOUT CONTRAST TECHNIQUE: Multiplanar, multi-echo pulse sequences of the brain and surrounding structures were acquired without intravenous contrast. Angiographic images of the Circle of Willis were acquired using MRA technique without intravenous contrast. COMPARISON:  CT head 11/10/2020. FINDINGS: MRI HEAD FINDINGS Brain: Multiple cortical and subcortical acute infarcts in the left frontal lobe and anterior left insula,  confluent in areas. Multiple smaller acute infarcts in the left parietal cortex, and right frontoparietal white matter (annotated on series 5). Mild associated edema without mass effect. No midline shift. Additional moderate patchy T2 hyperintensities within the white matter, compatible with chronic microvascular ischemic disease. Mild atrophy. No evidence of acute hemorrhage, hydrocephalus, mass  lesion, extra-axial fluid collection, or hydrocephalus. Vascular: See below. Skull and upper cervical spine: Multiple sclerotic lesions in the partially imaged cervical spine, concerning metastases. Sinuses/Orbits: Moderate mucosal thickening of the ethmoid air cells, maxillary sinuses, and left sphenoid sinus with air-fluid levels in the maxillary and sphenoid sinuses. Other: Trace bilateral mastoid effusions. MRA HEAD FINDINGS Anterior circulation: Bilateral intracranial ICAs and M1 MCAs are patent without hemodynamically significant stenosis. Severe stenosis of a proximal left M2 MCA branch. Multifocal mild-to-moderate stenosis of multiple additional MCA branches bilaterally. Poor visualization of the ACAs with suspected severe left and moderate right A2 ACA stenosis. Right opthalmic artery infundibulum. Approximately 2-3 mm outpouching arising from the left paraclinoid ICA anteriorly. Posterior circulation: Small left intradural vertebral artery and dominant right intradural vertebral artery. Basilar artery bilateral posterior cerebral arteries are without proximal hemodynamically significant stenosis. Multifocal mild-to-moderate narrowing of the PCAs with for evaluation distally. Approximately 2 mm anteriorly directed outpouching arising from the proximal right PCA (series 100, image 108), concerning for aneurysm. IMPRESSION: MRI: 1. Acute cortical infarcts in the left frontal lobe and insula with additional smaller acute infarcts in the left parietal and right frontoparietal lobes. Mild associated edema without mass effect. Given involvement of multiple vascular territories, consider an embolic etiology. 2. Moderate chronic microvascular ischemic disease. 3. Multiple sclerotic lesions in the partially imaged cervical spine, concerning for metastases. Postcontrast imaging of head could provide more sensitive evaluation for intracranial metastatic disease if clinically indicated. 4. Moderate paranasal sinus  disease with air-fluid levels, detailed above. MRA: 1. Severe stenosis of a proximal left M2 MCA branch in the region of left frontal infarcts described above. Multifocal mild-to-moderate stenosis of multiple additional MCA branches bilaterally with poor evaluation distally. 2. Poor visualization of the ACAs with suspected severe left and moderate right A2 ACA stenosis. 3. Approximately 2 mm anteriorly directed outpouching arising from the proximal right PCA, concerning for aneurysm. 4. Approximately 2-3 mm outpouching arising from the left paraclinoid ICA anteriorly, suggestive of aneurysm versus opthalmic artery infundibulum with vessel too small to see by MRA. Additional outpouching arising from the right paraclinoid ICA is favored to be an infunidbulum. 5. A CTA could better characterize the above findings if clinically indicated. These results will be called to the ordering clinician or representative by the Radiologist Assistant, and communication documented in the PACS or [employer]. Electronically Signed   By: Margaretha Sheffield M.D.   On: 11/11/2020 17:30   MR BRAIN WO CONTRAST  Result Date: 11/11/2020 CLINICAL DATA:  Delirium EXAM: MRI HEAD WITHOUT CONTRAST MRA HEAD WITHOUT CONTRAST TECHNIQUE: Multiplanar, multi-echo pulse sequences of the brain and surrounding structures were acquired without intravenous contrast. Angiographic images of the Circle of Willis were acquired using MRA technique without intravenous contrast. COMPARISON:  CT head 11/10/2020. FINDINGS: MRI HEAD FINDINGS Brain: Multiple cortical and subcortical acute infarcts in the left frontal lobe and anterior left insula, confluent in areas. Multiple smaller acute infarcts in the left parietal cortex, and right frontoparietal white matter (annotated on series 5). Mild associated edema without mass effect. No midline shift. Additional moderate patchy T2 hyperintensities within the white matter, compatible with chronic microvascular  ischemic disease. Mild atrophy. No  evidence of acute hemorrhage, hydrocephalus, mass lesion, extra-axial fluid collection, or hydrocephalus. Vascular: See below. Skull and upper cervical spine: Multiple sclerotic lesions in the partially imaged cervical spine, concerning metastases. Sinuses/Orbits: Moderate mucosal thickening of the ethmoid air cells, maxillary sinuses, and left sphenoid sinus with air-fluid levels in the maxillary and sphenoid sinuses. Other: Trace bilateral mastoid effusions. MRA HEAD FINDINGS Anterior circulation: Bilateral intracranial ICAs and M1 MCAs are patent without hemodynamically significant stenosis. Severe stenosis of a proximal left M2 MCA branch. Multifocal mild-to-moderate stenosis of multiple additional MCA branches bilaterally. Poor visualization of the ACAs with suspected severe left and moderate right A2 ACA stenosis. Right opthalmic artery infundibulum. Approximately 2-3 mm outpouching arising from the left paraclinoid ICA anteriorly. Posterior circulation: Small left intradural vertebral artery and dominant right intradural vertebral artery. Basilar artery bilateral posterior cerebral arteries are without proximal hemodynamically significant stenosis. Multifocal mild-to-moderate narrowing of the PCAs with for evaluation distally. Approximately 2 mm anteriorly directed outpouching arising from the proximal right PCA (series 100, image 108), concerning for aneurysm. IMPRESSION: MRI: 1. Acute cortical infarcts in the left frontal lobe and insula with additional smaller acute infarcts in the left parietal and right frontoparietal lobes. Mild associated edema without mass effect. Given involvement of multiple vascular territories, consider an embolic etiology. 2. Moderate chronic microvascular ischemic disease. 3. Multiple sclerotic lesions in the partially imaged cervical spine, concerning for metastases. Postcontrast imaging of head could provide more sensitive evaluation for  intracranial metastatic disease if clinically indicated. 4. Moderate paranasal sinus disease with air-fluid levels, detailed above. MRA: 1. Severe stenosis of a proximal left M2 MCA branch in the region of left frontal infarcts described above. Multifocal mild-to-moderate stenosis of multiple additional MCA branches bilaterally with poor evaluation distally. 2. Poor visualization of the ACAs with suspected severe left and moderate right A2 ACA stenosis. 3. Approximately 2 mm anteriorly directed outpouching arising from the proximal right PCA, concerning for aneurysm. 4. Approximately 2-3 mm outpouching arising from the left paraclinoid ICA anteriorly, suggestive of aneurysm versus opthalmic artery infundibulum with vessel too small to see by MRA. Additional outpouching arising from the right paraclinoid ICA is favored to be an infunidbulum. 5. A CTA could better characterize the above findings if clinically indicated. These results will be called to the ordering clinician or representative by the Radiologist Assistant, and communication documented in the PACS or [employer]. Electronically Signed   By: Margaretha Sheffield M.D.   On: 11/11/2020 17:30   US Carotid Bilateral  Result Date: 11/13/2020 CLINICAL DATA:  Altered mental status Prostate cancer EXAM: BILATERAL CAROTID DUPLEX ULTRASOUND TECHNIQUE: Pearline Cables scale imaging, color Doppler and duplex ultrasound were performed of bilateral carotid and vertebral arteries in the neck. COMPARISON:  None. FINDINGS: Criteria: Quantification of carotid stenosis is based on velocity parameters that correlate the residual internal carotid diameter with NASCET-based stenosis levels, using the diameter of the distal internal carotid lumen as the denominator for stenosis measurement. The following velocity measurements were obtained: RIGHT ICA: 63/13 cm/sec CCA: [id number] cm/sec SYSTOLIC ICA/CCA RATIO:  0.9 ECA: 75 cm/sec LEFT ICA: 136/10 cm/sec CCA: [id number] cm/sec SYSTOLIC ICA/CCA  RATIO:  1.7 ECA: 173 cm/sec RIGHT CAROTID ARTERY: Mild atheromatous plaque of the internal carotid artery origin. RIGHT VERTEBRAL ARTERY:  Antegrade flow. LEFT CAROTID ARTERY: Mild heterogeneous plaque of the carotid bifurcation. LEFT VERTEBRAL ARTERY:  Antegrade flow. IMPRESSION: 1. 50-69% stenosis of the left internal carotid artery. 2. Less than 50% stenosis of the right internal carotid artery. Electronically Signed  By: Sharen Heck  Mir M.D.   On: 11/13/2020 09:55   DG Chest Port 1 View  Result Date: 11/10/2020 CLINICAL DATA:  Altered mental status.  Unwitnessed fall. EXAM: PORTABLE CHEST 1 VIEW COMPARISON:  None. FINDINGS: Shallow inspiration. Heart size and pulmonary vascularity are normal for inspiratory effort. There is a mild interstitial pattern seen to the lungs with some peribronchial thickening. This is likely chronic bronchitis although early multifocal pneumonia could have this appearance in the appropriate clinical setting. No focal consolidation. No pleural effusions. No pneumothorax. Mediastinal contours appear intact. Calcification of the aorta. Visualized bones are nondepressed. IMPRESSION: Chronic bronchitic changes in the lungs. No active pulmonary disease. Electronically Signed   By: Lucienne Capers M.D.   On: 11/10/2020 19:21   ECHOCARDIOGRAM COMPLETE  Result Date: 11/12/2020    ECHOCARDIOGRAM REPORT   Patient Name:   Caleb Nunez Date of Exam: 11/12/2020 Medical Rec #:  [id number]   Height:       69.0 in Accession #:    [id number]  Weight:       140.0 lb Date of Birth:  1927-10-19    BSA:          1.775 m Patient Age:    89 years    BP:           166/77 mmHg Patient Gender: M           HR:           65 bpm. Exam Location:  [facility] Procedure: 2D Echo, Cardiac Doppler and Color Doppler Indications:    Stroke  History:        Patient has no prior history of Echocardiogram examinations.                 Stroke; Arrythmias:Atrial Fibrillation. Altered Mental status.  Sonographer:    Wenda Low Referring Phys: [id number] SEYED A SHAHMEHDI IMPRESSIONS  1. Left ventricular ejection fraction, by estimation, is 55 to 60%. The left ventricle has normal function. The left ventricle has no regional wall motion abnormalities. There is mild left ventricular hypertrophy. Left ventricular diastolic parameters are indeterminate.  2. Right ventricular systolic function is normal. The right ventricular size is normal. There is normal pulmonary artery systolic pressure.  3. The mitral valve is normal in structure. Mild mitral valve regurgitation. No evidence of mitral stenosis.  4. The aortic valve is tricuspid. There is mild calcification of the aortic valve. There is mild thickening of the aortic valve. Aortic valve regurgitation is mild.  5. The inferior vena cava is normal in size with greater than 50% respiratory variability, suggesting right atrial pressure of 3 mmHg. FINDINGS  Left Ventricle: Left ventricular ejection fraction, by estimation, is 55 to 60%. The left ventricle has normal function. The left ventricle has no regional wall motion abnormalities. The left ventricular internal cavity size was normal in size. There is  mild left ventricular hypertrophy. Left ventricular diastolic parameters are indeterminate. Right Ventricle: The right ventricular size is normal. No increase in right ventricular wall thickness. Right ventricular systolic function is normal. There is normal pulmonary artery systolic pressure. The tricuspid regurgitant velocity is 2.43 m/s, and  with an assumed right atrial pressure of 8 mmHg, the estimated right ventricular systolic pressure is [id number] mmHg. Left Atrium: Left atrial size was normal in size. Right Atrium: Right atrial size was normal in size. Pericardium: There is no evidence of pericardial effusion. Mitral Valve: The mitral valve is normal in structure. There is mild  thickening of the mitral valve leaflet(s). There is mild calcification of the mitral valve leaflet(s).  Mild mitral annular calcification. Mild mitral valve regurgitation. No evidence of  mitral valve stenosis. MV peak gradient, 4.5 mmHg. The mean mitral valve gradient is 2.0 mmHg. Tricuspid Valve: The tricuspid valve is normal in structure. Tricuspid valve regurgitation is mild . No evidence of tricuspid stenosis. Aortic Valve: The aortic valve is tricuspid. There is mild calcification of the aortic valve. There is mild thickening of the aortic valve. There is mild aortic valve annular calcification. Aortic valve regurgitation is mild. Aortic valve mean gradient measures 6.0 mmHg. Aortic valve peak gradient measures 10.8 mmHg. Aortic valve area, by VTI measures 1.90 cm. Pulmonic Valve: The pulmonic valve was not well visualized. Pulmonic valve regurgitation is not visualized. No evidence of pulmonic stenosis. Aorta: The aortic root is normal in size and structure. Venous: The inferior vena cava is normal in size with greater than 50% respiratory variability, suggesting right atrial pressure of 3 mmHg. IAS/Shunts: No atrial level shunt detected by color flow Doppler.  LEFT VENTRICLE PLAX 2D LVIDd:         5.28 cm  Diastology LVIDs:         3.33 cm  LV e' medial:    6.22 cm/s LV PW:         1.03 cm  LV E/e' medial:  13.6 LV IVS:        1.10 cm  LV e' lateral:   7.45 cm/s LVOT diam:     2.00 cm  LV E/e' lateral: 11.4 LV SV:         76 LV SV Index:   43 LVOT Area:     3.14 cm  RIGHT VENTRICLE RV Basal diam:  2.99 cm RV Mid diam:    2.83 cm RV S prime:     13.20 cm/s TAPSE (M-mode): 2.0 cm LEFT ATRIUM             Index       RIGHT ATRIUM           Index LA diam:        4.30 cm 2.42 cm/m  RA Area:     15.20 cm LA Vol (A2C):   51.4 ml 28.95 ml/m RA Volume:   35.40 ml  19.94 ml/m LA Vol (A4C):   58.6 ml 33.01 ml/m LA Biplane Vol: 54.9 ml 30.92 ml/m  AORTIC VALVE AV Area (Vmax):    1.90 cm AV Area (Vmean):   1.75 cm AV Area (VTI):     1.90 cm AV Vmax:           164.00 cm/s AV Vmean:          114.000 cm/s AV VTI:             0.399 m AV Peak Grad:      10.8 mmHg AV Mean Grad:      6.0 mmHg LVOT Vmax:         99.20 cm/s LVOT Vmean:        63.600 cm/s LVOT VTI:          0.241 m LVOT/AV VTI ratio: 0.60  AORTA Ao Root diam: 3.10 cm MITRAL VALVE               TRICUSPID VALVE MV Area (PHT): 3.24 cm    TR Peak grad:   23.6 mmHg MV Area VTI:   2.05 cm    TR Vmax:  243.00 cm/s MV Peak grad:  4.5 mmHg MV Mean grad:  2.0 mmHg    SHUNTS MV Vmax:       1.06 m/s    Systemic VTI:  0.24 m MV Vmean:      57.8 cm/s   Systemic Diam: 2.00 cm MV Decel Time: 234 msec MV E velocity: 84.80 cm/s MV A velocity: 90.40 cm/s MV E/A ratio:  0.94 Carlyle Dolly MD Electronically signed by Carlyle Dolly MD Signature Date/Time: 11/12/2020/3:57:08 PM    Final         Discharge Exam: Vitals:   11/12/20 2025 11/13/20 0414  BP: (!) 168/76 (!) 156/74  Pulse: 63 (!) 58  Resp: 18 19  Temp: 98.2 F (36.8 C) 98.2 F (36.8 C)  SpO2: 99% 98%   Vitals:   11/12/20 0427 11/12/20 1402 11/12/20 2025 11/13/20 0414  BP: (!) 166/77 (!) 162/76 (!) 168/76 (!) 156/74  Pulse: 60 62 63 (!) 58  Resp: '19 18 18 19  '$ Temp: 98.1 F (36.7 C) 98.6 F (37 C) 98.2 F (36.8 C) 98.2 F (36.8 C)  TempSrc:  Oral    SpO2: 98% 98% 99% 98%  Weight:      Height:        General: Pt is alert, awake, not in acute distress Cardiovascular: RRR, S1/S2 +, no rubs, no gallops Respiratory: CTA bilaterally, no wheezing, no rhonchi Abdominal: Soft, NT, ND, bowel sounds + Extremities: no edema, no cyanosis   The results of significant diagnostics from this hospitalization (including imaging, microbiology, ancillary and laboratory) are listed below for reference.    Significant Diagnostic Studies: CT Head Wo Contrast  Result Date: 11/10/2020 CLINICAL DATA:  Altered mental status EXAM: CT HEAD WITHOUT CONTRAST TECHNIQUE: Contiguous axial images were obtained from the base of the skull through the vertex without intravenous contrast. COMPARISON:  None. FINDINGS:  Brain: Diffuse low-density throughout the deep white matter, likely chronic small vessel disease. No acute intracranial abnormality. Specifically, no hemorrhage, hydrocephalus, mass lesion, acute infarction, or significant intracranial injury. Vascular: No hyperdense vessel or unexpected calcification. Skull: No acute calvarial abnormality. Sinuses/Orbits: Air-fluid levels in the left sphenoid sinus and bilateral maxillary sinuses. Other: None IMPRESSION: Chronic small vessel disease. No acute intracranial abnormality. Acute sinusitis. Electronically Signed   By: Rolm Baptise M.D.   On: 11/10/2020 20:12   CT Cervical Spine Wo Contrast  Result Date: 11/10/2020 CLINICAL DATA:  Unwitnessed fall EXAM: CT CERVICAL SPINE WITHOUT CONTRAST TECHNIQUE: Multidetector CT imaging of the cervical spine was performed without intravenous contrast. Multiplanar CT image reconstructions were also generated. COMPARISON:  None. FINDINGS: Alignment: No subluxation Skull base and vertebrae: No fracture. There are multiple sclerotic lesions throughout the cervical spine. Soft tissues and spinal canal: No prevertebral fluid or swelling. No visible canal hematoma. Disc levels:  Maintained.  Degenerative facet disease. Upper chest: Sclerotic lesions in the upper ribs. Other: None IMPRESSION: No evidence of fracture. 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 Electronically Signed   By: Rolm Baptise M.D.   On: 11/10/2020 20:14   MR ANGIO HEAD WO CONTRAST  Result Date: 11/11/2020 CLINICAL DATA:  Delirium EXAM: MRI HEAD WITHOUT CONTRAST MRA HEAD WITHOUT CONTRAST TECHNIQUE: Multiplanar, multi-echo pulse sequences of the brain and surrounding structures were acquired without intravenous contrast. Angiographic images of the Circle of Willis were acquired using MRA technique without intravenous contrast. COMPARISON:  CT head 11/10/2020. FINDINGS: MRI HEAD FINDINGS Brain:  Multiple cortical and subcortical acute  infarcts in the left frontal lobe and anterior left insula, confluent in areas. Multiple smaller acute infarcts in the left parietal cortex, and right frontoparietal white matter (annotated on series 5). Mild associated edema without mass effect. No midline shift. Additional moderate patchy T2 hyperintensities within the white matter, compatible with chronic microvascular ischemic disease. Mild atrophy. No evidence of acute hemorrhage, hydrocephalus, mass lesion, extra-axial fluid collection, or hydrocephalus. Vascular: See below. Skull and upper cervical spine: Multiple sclerotic lesions in the partially imaged cervical spine, concerning metastases. Sinuses/Orbits: Moderate mucosal thickening of the ethmoid air cells, maxillary sinuses, and left sphenoid sinus with air-fluid levels in the maxillary and sphenoid sinuses. Other: Trace bilateral mastoid effusions. MRA HEAD FINDINGS Anterior circulation: Bilateral intracranial ICAs and M1 MCAs are patent without hemodynamically significant stenosis. Severe stenosis of a proximal left M2 MCA branch. Multifocal mild-to-moderate stenosis of multiple additional MCA branches bilaterally. Poor visualization of the ACAs with suspected severe left and moderate right A2 ACA stenosis. Right opthalmic artery infundibulum. Approximately 2-3 mm outpouching arising from the left paraclinoid ICA anteriorly. Posterior circulation: Small left intradural vertebral artery and dominant right intradural vertebral artery. Basilar artery bilateral posterior cerebral arteries are without proximal hemodynamically significant stenosis. Multifocal mild-to-moderate narrowing of the PCAs with for evaluation distally. Approximately 2 mm anteriorly directed outpouching arising from the proximal right PCA (series 100, image 108), concerning for aneurysm. IMPRESSION: MRI: 1. Acute cortical infarcts in the left frontal lobe and insula with additional smaller  acute infarcts in the left parietal and right frontoparietal lobes. Mild associated edema without mass effect. Given involvement of multiple vascular territories, consider an embolic etiology. 2. Moderate chronic microvascular ischemic disease. 3. Multiple sclerotic lesions in the partially imaged cervical spine, concerning for metastases. Postcontrast imaging of head could provide more sensitive evaluation for intracranial metastatic disease if clinically indicated. 4. Moderate paranasal sinus disease with air-fluid levels, detailed above. MRA: 1. Severe stenosis of a proximal left M2 MCA branch in the region of left frontal infarcts described above. Multifocal mild-to-moderate stenosis of multiple additional MCA branches bilaterally with poor evaluation distally. 2. Poor visualization of the ACAs with suspected severe left and moderate right A2 ACA stenosis. 3. Approximately 2 mm anteriorly directed outpouching arising from the proximal right PCA, concerning for aneurysm. 4. Approximately 2-3 mm outpouching arising from the left paraclinoid ICA anteriorly, suggestive of aneurysm versus opthalmic artery infundibulum with vessel too small to see by MRA. Additional outpouching arising from the right paraclinoid ICA is favored to be an infunidbulum. 5. A CTA could better characterize the above findings if clinically indicated. These results will be called to the ordering clinician or representative by the Radiologist Assistant, and communication documented in the PACS or [employer]. Electronically Signed   By: Margaretha Sheffield M.D.   On: 11/11/2020 17:30   MR BRAIN WO CONTRAST  Result Date: 11/11/2020 CLINICAL DATA:  Delirium EXAM: MRI HEAD WITHOUT CONTRAST MRA HEAD WITHOUT CONTRAST TECHNIQUE: Multiplanar, multi-echo pulse sequences of the brain and surrounding structures were acquired without intravenous contrast. Angiographic images of the Circle of Willis were acquired using MRA technique without  intravenous contrast. COMPARISON:  CT head 11/10/2020. FINDINGS: MRI HEAD FINDINGS Brain: Multiple cortical and subcortical acute infarcts in the left frontal lobe and anterior left insula, confluent in areas. Multiple smaller acute infarcts in the left parietal cortex, and right frontoparietal white matter (annotated on series 5). Mild associated edema without mass effect. No midline shift. Additional moderate patchy T2 hyperintensities within the white matter,  compatible with chronic microvascular ischemic disease. Mild atrophy. No evidence of acute hemorrhage, hydrocephalus, mass lesion, extra-axial fluid collection, or hydrocephalus. Vascular: See below. Skull and upper cervical spine: Multiple sclerotic lesions in the partially imaged cervical spine, concerning metastases. Sinuses/Orbits: Moderate mucosal thickening of the ethmoid air cells, maxillary sinuses, and left sphenoid sinus with air-fluid levels in the maxillary and sphenoid sinuses. Other: Trace bilateral mastoid effusions. MRA HEAD FINDINGS Anterior circulation: Bilateral intracranial ICAs and M1 MCAs are patent without hemodynamically significant stenosis. Severe stenosis of a proximal left M2 MCA branch. Multifocal mild-to-moderate stenosis of multiple additional MCA branches bilaterally. Poor visualization of the ACAs with suspected severe left and moderate right A2 ACA stenosis. Right opthalmic artery infundibulum. Approximately 2-3 mm outpouching arising from the left paraclinoid ICA anteriorly. Posterior circulation: Small left intradural vertebral artery and dominant right intradural vertebral artery. Basilar artery bilateral posterior cerebral arteries are without proximal hemodynamically significant stenosis. Multifocal mild-to-moderate narrowing of the PCAs with for evaluation distally. Approximately 2 mm anteriorly directed outpouching arising from the proximal right PCA (series 100, image 108), concerning for aneurysm. IMPRESSION: MRI:  1. Acute cortical infarcts in the left frontal lobe and insula with additional smaller acute infarcts in the left parietal and right frontoparietal lobes. Mild associated edema without mass effect. Given involvement of multiple vascular territories, consider an embolic etiology. 2. Moderate chronic microvascular ischemic disease. 3. Multiple sclerotic lesions in the partially imaged cervical spine, concerning for metastases. Postcontrast imaging of head could provide more sensitive evaluation for intracranial metastatic disease if clinically indicated. 4. Moderate paranasal sinus disease with air-fluid levels, detailed above. MRA: 1. Severe stenosis of a proximal left M2 MCA branch in the region of left frontal infarcts described above. Multifocal mild-to-moderate stenosis of multiple additional MCA branches bilaterally with poor evaluation distally. 2. Poor visualization of the ACAs with suspected severe left and moderate right A2 ACA stenosis. 3. Approximately 2 mm anteriorly directed outpouching arising from the proximal right PCA, concerning for aneurysm. 4. Approximately 2-3 mm outpouching arising from the left paraclinoid ICA anteriorly, suggestive of aneurysm versus opthalmic artery infundibulum with vessel too small to see by MRA. Additional outpouching arising from the right paraclinoid ICA is favored to be an infunidbulum. 5. A CTA could better characterize the above findings if clinically indicated. These results will be called to the ordering clinician or representative by the Radiologist Assistant, and communication documented in the PACS or [employer]. Electronically Signed   By: Margaretha Sheffield M.D.   On: 11/11/2020 17:30   US Carotid Bilateral  Result Date: 11/13/2020 CLINICAL DATA:  Altered mental status Prostate cancer EXAM: BILATERAL CAROTID DUPLEX ULTRASOUND TECHNIQUE: Pearline Cables scale imaging, color Doppler and duplex ultrasound were performed of bilateral carotid and vertebral arteries in  the neck. COMPARISON:  None. FINDINGS: Criteria: Quantification of carotid stenosis is based on velocity parameters that correlate the residual internal carotid diameter with NASCET-based stenosis levels, using the diameter of the distal internal carotid lumen as the denominator for stenosis measurement. The following velocity measurements were obtained: RIGHT ICA: 63/13 cm/sec CCA: [id number] cm/sec SYSTOLIC ICA/CCA RATIO:  0.9 ECA: 75 cm/sec LEFT ICA: 136/10 cm/sec CCA: [id number] cm/sec SYSTOLIC ICA/CCA RATIO:  1.7 ECA: 173 cm/sec RIGHT CAROTID ARTERY: Mild atheromatous plaque of the internal carotid artery origin. RIGHT VERTEBRAL ARTERY:  Antegrade flow. LEFT CAROTID ARTERY: Mild heterogeneous plaque of the carotid bifurcation. LEFT VERTEBRAL ARTERY:  Antegrade flow. IMPRESSION: 1. 50-69% stenosis of the left internal carotid artery. 2. Less than 50% stenosis  of the right internal carotid artery. Electronically Signed   By: Miachel Roux M.D.   On: 11/13/2020 09:55   DG Chest Port 1 View  Result Date: 11/10/2020 CLINICAL DATA:  Altered mental status.  Unwitnessed fall. EXAM: PORTABLE CHEST 1 VIEW COMPARISON:  None. FINDINGS: Shallow inspiration. Heart size and pulmonary vascularity are normal for inspiratory effort. There is a mild interstitial pattern seen to the lungs with some peribronchial thickening. This is likely chronic bronchitis although early multifocal pneumonia could have this appearance in the appropriate clinical setting. No focal consolidation. No pleural effusions. No pneumothorax. Mediastinal contours appear intact. Calcification of the aorta. Visualized bones are nondepressed. IMPRESSION: Chronic bronchitic changes in the lungs. No active pulmonary disease. Electronically Signed   By: Lucienne Capers M.D.   On: 11/10/2020 19:21   ECHOCARDIOGRAM COMPLETE  Result Date: 11/12/2020    ECHOCARDIOGRAM REPORT   Patient Name:   Caleb Nunez Date of Exam: 11/12/2020 Medical Rec #:  [id number]   Height:        69.0 in Accession #:    [id number]  Weight:       140.0 lb Date of Birth:  November 10, 1927    BSA:          1.775 m Patient Age:    30 years    BP:           166/77 mmHg Patient Gender: M           HR:           65 bpm. Exam Location:  [facility] Procedure: 2D Echo, Cardiac Doppler and Color Doppler Indications:    Stroke  History:        Patient has no prior history of Echocardiogram examinations.                 Stroke; Arrythmias:Atrial Fibrillation. Altered Mental status.  Sonographer:    Wenda Low Referring Phys: [phone] SEYED A SHAHMEHDI IMPRESSIONS  1. Left ventricular ejection fraction, by estimation, is 55 to 60%. The left ventricle has normal function. The left ventricle has no regional wall motion abnormalities. There is mild left ventricular hypertrophy. Left ventricular diastolic parameters are indeterminate.  2. Right ventricular systolic function is normal. The right ventricular size is normal. There is normal pulmonary artery systolic pressure.  3. The mitral valve is normal in structure. Mild mitral valve regurgitation. No evidence of mitral stenosis.  4. The aortic valve is tricuspid. There is mild calcification of the aortic valve. There is mild thickening of the aortic valve. Aortic valve regurgitation is mild.  5. The inferior vena cava is normal in size with greater than 50% respiratory variability, suggesting right atrial pressure of 3 mmHg. FINDINGS  Left Ventricle: Left ventricular ejection fraction, by estimation, is 55 to 60%. The left ventricle has normal function. The left ventricle has no regional wall motion abnormalities. The left ventricular internal cavity size was normal in size. There is  mild left ventricular hypertrophy. Left ventricular diastolic parameters are indeterminate. Right Ventricle: The right ventricular size is normal. No increase in right ventricular wall thickness. Right ventricular systolic function is normal. There is normal pulmonary artery systolic pressure.  The tricuspid regurgitant velocity is 2.43 m/s, and  with an assumed right atrial pressure of 8 mmHg, the estimated right ventricular systolic pressure is [id number] mmHg. Left Atrium: Left atrial size was normal in size. Right Atrium: Right atrial size was normal in size. Pericardium: There is no evidence of pericardial effusion. Mitral  Valve: The mitral valve is normal in structure. There is mild thickening of the mitral valve leaflet(s). There is mild calcification of the mitral valve leaflet(s). Mild mitral annular calcification. Mild mitral valve regurgitation. No evidence of  mitral valve stenosis. MV peak gradient, 4.5 mmHg. The mean mitral valve gradient is 2.0 mmHg. Tricuspid Valve: The tricuspid valve is normal in structure. Tricuspid valve regurgitation is mild . No evidence of tricuspid stenosis. Aortic Valve: The aortic valve is tricuspid. There is mild calcification of the aortic valve. There is mild thickening of the aortic valve. There is mild aortic valve annular calcification. Aortic valve regurgitation is mild. Aortic valve mean gradient measures 6.0 mmHg. Aortic valve peak gradient measures 10.8 mmHg. Aortic valve area, by VTI measures 1.90 cm. Pulmonic Valve: The pulmonic valve was not well visualized. Pulmonic valve regurgitation is not visualized. No evidence of pulmonic stenosis. Aorta: The aortic root is normal in size and structure. Venous: The inferior vena cava is normal in size with greater than 50% respiratory variability, suggesting right atrial pressure of 3 mmHg. IAS/Shunts: No atrial level shunt detected by color flow Doppler.  LEFT VENTRICLE PLAX 2D LVIDd:         5.28 cm  Diastology LVIDs:         3.33 cm  LV e' medial:    6.22 cm/s LV PW:         1.03 cm  LV E/e' medial:  13.6 LV IVS:        1.10 cm  LV e' lateral:   7.45 cm/s LVOT diam:     2.00 cm  LV E/e' lateral: 11.4 LV SV:         76 LV SV Index:   43 LVOT Area:     3.14 cm  RIGHT VENTRICLE RV Basal diam:  2.99 cm RV Mid diam:     2.83 cm RV S prime:     13.20 cm/s TAPSE (M-mode): 2.0 cm LEFT ATRIUM             Index       RIGHT ATRIUM           Index LA diam:        4.30 cm 2.42 cm/m  RA Area:     15.20 cm LA Vol (A2C):   51.4 ml 28.95 ml/m RA Volume:   35.40 ml  19.94 ml/m LA Vol (A4C):   58.6 ml 33.01 ml/m LA Biplane Vol: 54.9 ml 30.92 ml/m  AORTIC VALVE AV Area (Vmax):    1.90 cm AV Area (Vmean):   1.75 cm AV Area (VTI):     1.90 cm AV Vmax:           164.00 cm/s AV Vmean:          114.000 cm/s AV VTI:            0.399 m AV Peak Grad:      10.8 mmHg AV Mean Grad:      6.0 mmHg LVOT Vmax:         99.20 cm/s LVOT Vmean:        63.600 cm/s LVOT VTI:          0.241 m LVOT/AV VTI ratio: 0.60  AORTA Ao Root diam: 3.10 cm MITRAL VALVE               TRICUSPID VALVE MV Area (PHT): 3.24 cm    TR Peak grad:   23.6 mmHg MV Area VTI:  2.05 cm    TR Vmax:        243.00 cm/s MV Peak grad:  4.5 mmHg MV Mean grad:  2.0 mmHg    SHUNTS MV Vmax:       1.06 m/s    Systemic VTI:  0.24 m MV Vmean:      57.8 cm/s   Systemic Diam: 2.00 cm MV Decel Time: 234 msec MV E velocity: 84.80 cm/s MV A velocity: 90.40 cm/s MV E/A ratio:  0.94 Carlyle Dolly MD Electronically signed by Carlyle Dolly MD Signature Date/Time: 11/12/2020/3:57:08 PM    Final     Microbiology: Recent Results (from the past 240 hour(s))  Resp Panel by RT-PCR (Flu A&B, Covid) Nasopharyngeal Swab     Status: None   Collection Time: 11/10/20  7:09 PM   Specimen: Nasopharyngeal Swab; Nasopharyngeal(NP) swabs in vial transport medium  Result Value Ref Range Status   SARS Coronavirus 2 by RT PCR NEGATIVE NEGATIVE Final    Comment: (NOTE) SARS-CoV-2 target nucleic acids are NOT DETECTED.  The SARS-CoV-2 RNA is generally detectable in upper respiratory specimens during the acute phase of infection. The lowest concentration of SARS-CoV-2 viral copies this assay can detect is 138 copies/mL. A negative result does not preclude SARS-Cov-2 infection and should not be used as the  sole basis for treatment or other patient management decisions. A negative result may occur with  improper specimen collection/handling, submission of specimen other than nasopharyngeal swab, presence of viral mutation(s) within the areas targeted by this assay, and inadequate number of viral copies(<138 copies/mL). A negative result must be combined with clinical observations, patient history, and epidemiological information. The expected result is Negative.  Fact Sheet for Patients:  EntrepreneurPulse.com.au  Fact Sheet for Healthcare Providers:  IncredibleEmployment.be  This test is no t yet approved or cleared by the [place] FDA and  has been authorized for detection and/or diagnosis of SARS-CoV-2 by FDA under an Emergency Use Authorization (EUA). This EUA will remain  in effect (meaning this test can be used) for the duration of the COVID-19 declaration under Section 564(b)(1) of the Act, 21 U.S.C.section 360bbb-3(b)(1), unless the authorization is terminated  or revoked sooner.       Influenza A by PCR NEGATIVE NEGATIVE Final   Influenza B by PCR NEGATIVE NEGATIVE Final    Comment: (NOTE) The Xpert Xpress SARS-CoV-2/FLU/RSV plus assay is intended as an aid in the diagnosis of influenza from Nasopharyngeal swab specimens and should not be used as a sole basis for treatment. Nasal washings and aspirates are unacceptable for Xpert Xpress SARS-CoV-2/FLU/RSV testing.  Fact Sheet for Patients: EntrepreneurPulse.com.au  Fact Sheet for Healthcare Providers: IncredibleEmployment.be  This test is not yet approved or cleared by the [place] FDA and has been authorized for detection and/or diagnosis of SARS-CoV-2 by FDA under an Emergency Use Authorization (EUA). This EUA will remain in effect (meaning this test can be used) for the duration of the COVID-19 declaration under Section 564(b)(1) of the  Act, 21 U.S.C. section 360bbb-3(b)(1), unless the authorization is terminated or revoked.  Performed at [facility], [address] [place], [place] [address]      Labs: Basic Metabolic Panel: Recent Labs  Lab 11/10/20 1920 11/11/20 0512 11/13/20 0537  NA 136 136 137  K 3.9 3.9 3.7  CL 104 107 103  CO2 '24 22 26  '$ GLUCOSE 114* 90 100*  BUN 26* 25* 22  CREATININE 1.28* 1.16 1.23  CALCIUM 8.7* 7.9* 8.1*   Liver  Function Tests: Recent Labs  Lab 11/10/20 1920  AST 44*  ALT 22  ALKPHOS 62  BILITOT 1.3*  PROT 7.1  ALBUMIN 3.7   No results for input(s): LIPASE, AMYLASE in the last 168 hours. No results for input(s): AMMONIA in the last 168 hours. CBC: Recent Labs  Lab 11/10/20 1920 11/11/20 0512  WBC 14.8* 10.9*  NEUTROABS 13.0*  --   HGB 14.1 12.7*  HCT 41.8 37.8*  MCV 97.9 97.7  PLT 300 250   Cardiac Enzymes: Recent Labs  Lab 11/10/20 1920 11/11/20 0512 11/13/20 0537  CKTOTAL 999* 995* 297   BNP: Invalid input(s): POCBNP CBG: No results for input(s): GLUCAP in the last 168 hours.  Time coordinating discharge:  36 minutes  Signed:  Orson Eva, DO [facility] Pager: [phone] 11/13/2020, 11:17 AM

## 2020-11-13 NOTE — Progress Notes (Signed)
Discharge instructions reviewed with patient's daughter. She verbalized understanding of instructions, patient discharged home with daughter in stable condition.

## 2020-11-13 NOTE — TOC Transition Note (Signed)
Transition of Care [facility]) - CM/SW Discharge Note   Patient Details  Name: Caleb Nunez MRN: [id number] Date of Birth: 1927-08-19  [facility]) CM/SW Contact:  Ihor Gully, LCSW Phone Number: 11/13/2020, 11:50 AM   Clinical Narrative:    Chrys Racer with [facility] notified of discharge today. Per Palliative care NP family wants palliative to follow at discharge. [facility] provides palliative services and will provide to patient.  Daughter, Ms. Baker notified.    Final next level of care:  (Independent Living) Barriers to Discharge: No Barriers Identified   Patient Goals and CMS Choice Patient states their goals for this hospitalization and ongoing recovery are:: return to independent living.   Choice offered to / list presented to : Adult Children  Discharge Placement                       Discharge Plan and Services                          HH Arranged: PT HH Agency: Other - See comment ([place]) Date [place]: 11/12/20 Time [place]: [place] Representative spoke with at [place]: [place] ([place]) Interventions     Readmission Risk Interventions No flowsheet data found.

## 2021-01-27 ENCOUNTER — Ambulatory Visit: Payer: Medicare Other | Admitting: Gastroenterology

## 2021-02-19 DEATH — deceased
